# Patient Record
Sex: Female | Born: 1965 | Race: White | Hispanic: No | Marital: Single | Smoking: Former smoker
Health system: Southern US, Community
[De-identification: ages and names within clinical notes are randomized; demographics above are authoritative.]

## PROBLEM LIST (undated history)

## (undated) DIAGNOSIS — R232 Flushing: Secondary | ICD-10-CM

## (undated) DIAGNOSIS — K219 Gastro-esophageal reflux disease without esophagitis: Secondary | ICD-10-CM

## (undated) DIAGNOSIS — F32A Depression, unspecified: Secondary | ICD-10-CM

## (undated) DIAGNOSIS — F329 Major depressive disorder, single episode, unspecified: Secondary | ICD-10-CM

## (undated) DIAGNOSIS — R12 Heartburn: Secondary | ICD-10-CM

## (undated) DIAGNOSIS — R0602 Shortness of breath: Secondary | ICD-10-CM

## (undated) DIAGNOSIS — I493 Ventricular premature depolarization: Secondary | ICD-10-CM

## (undated) DIAGNOSIS — J45909 Unspecified asthma, uncomplicated: Secondary | ICD-10-CM

## (undated) DIAGNOSIS — C50919 Malignant neoplasm of unspecified site of unspecified female breast: Secondary | ICD-10-CM

## (undated) DIAGNOSIS — C801 Malignant (primary) neoplasm, unspecified: Secondary | ICD-10-CM

## (undated) DIAGNOSIS — Z923 Personal history of irradiation: Secondary | ICD-10-CM

## (undated) DIAGNOSIS — F419 Anxiety disorder, unspecified: Secondary | ICD-10-CM

## (undated) DIAGNOSIS — R011 Cardiac murmur, unspecified: Secondary | ICD-10-CM

## (undated) DIAGNOSIS — J449 Chronic obstructive pulmonary disease, unspecified: Secondary | ICD-10-CM

## (undated) DIAGNOSIS — Z8489 Family history of other specified conditions: Secondary | ICD-10-CM

## (undated) HISTORY — DX: Major depressive disorder, single episode, unspecified: F32.9

## (undated) HISTORY — DX: Chronic obstructive pulmonary disease, unspecified: J44.9

## (undated) HISTORY — DX: Flushing: R23.2

## (undated) HISTORY — PX: WISDOM TOOTH EXTRACTION: SHX21

## (undated) HISTORY — PX: BREAST LUMPECTOMY: SHX2

## (undated) HISTORY — PX: ABDOMINAL HYSTERECTOMY: SHX81

## (undated) HISTORY — PX: OTHER SURGICAL HISTORY: SHX169

## (undated) HISTORY — PX: TONSILLECTOMY: SUR1361

## (undated) HISTORY — PX: BREAST SURGERY: SHX581

## (undated) HISTORY — PX: BREAST BIOPSY: SHX20

## (undated) HISTORY — DX: Depression, unspecified: F32.A

---

## 2009-04-18 DIAGNOSIS — J449 Chronic obstructive pulmonary disease, unspecified: Secondary | ICD-10-CM

## 2009-04-18 HISTORY — DX: Chronic obstructive pulmonary disease, unspecified: J44.9

## 2009-06-11 ENCOUNTER — Observation Stay (HOSPITAL_COMMUNITY): Admission: EM | Admit: 2009-06-11 | Discharge: 2009-06-12 | Payer: Self-pay | Admitting: Emergency Medicine

## 2009-06-11 DIAGNOSIS — J449 Chronic obstructive pulmonary disease, unspecified: Secondary | ICD-10-CM

## 2009-06-11 DIAGNOSIS — J4489 Other specified chronic obstructive pulmonary disease: Secondary | ICD-10-CM | POA: Insufficient documentation

## 2009-06-12 LAB — CONVERTED CEMR LAB
ALT: 22 units/L
AST: 39 units/L
Alkaline Phosphatase: 44 units/L
BUN: 6 mg/dL
Creatinine, Ser: 0.95 mg/dL
Glucose, Bld: 125 mg/dL
Total Bilirubin: 0.2 mg/dL

## 2009-06-22 ENCOUNTER — Ambulatory Visit: Payer: Self-pay | Admitting: Nurse Practitioner

## 2009-06-22 DIAGNOSIS — F17200 Nicotine dependence, unspecified, uncomplicated: Secondary | ICD-10-CM

## 2009-06-22 DIAGNOSIS — N83209 Unspecified ovarian cyst, unspecified side: Secondary | ICD-10-CM

## 2009-06-22 DIAGNOSIS — R030 Elevated blood-pressure reading, without diagnosis of hypertension: Secondary | ICD-10-CM

## 2009-10-07 ENCOUNTER — Ambulatory Visit: Payer: Self-pay | Admitting: Nurse Practitioner

## 2009-10-07 ENCOUNTER — Telehealth (INDEPENDENT_AMBULATORY_CARE_PROVIDER_SITE_OTHER): Payer: Self-pay | Admitting: Nurse Practitioner

## 2009-10-07 DIAGNOSIS — N920 Excessive and frequent menstruation with regular cycle: Secondary | ICD-10-CM

## 2009-10-07 DIAGNOSIS — E781 Pure hyperglyceridemia: Secondary | ICD-10-CM

## 2009-10-07 LAB — CONVERTED CEMR LAB
ALT: 16 units/L (ref 0–35)
AST: 21 units/L (ref 0–37)
Albumin: 4.7 g/dL (ref 3.5–5.2)
Basophils Relative: 0 % (ref 0–1)
Blood in Urine, dipstick: NEGATIVE
CA 125: 17.2 units/mL (ref 0.0–30.2)
CO2: 21 meq/L (ref 19–32)
Chlamydia, DNA Probe: NEGATIVE
Cholesterol: 218 mg/dL — ABNORMAL HIGH (ref 0–200)
Creatinine, Ser: 0.91 mg/dL (ref 0.40–1.20)
Eosinophils Absolute: 0.2 10*3/uL (ref 0.0–0.7)
Eosinophils Relative: 3 % (ref 0–5)
Glucose, Bld: 81 mg/dL (ref 70–99)
Hemoglobin: 11.8 g/dL — ABNORMAL LOW (ref 12.0–15.0)
KOH Prep: NEGATIVE
LDL Cholesterol: 93 mg/dL (ref 0–99)
Lymphocytes Relative: 9 % — ABNORMAL LOW (ref 12–46)
MCHC: 31.1 g/dL (ref 30.0–36.0)
MCV: 85 fL (ref 78.0–100.0)
Monocytes Relative: 7 % (ref 3–12)
Nitrite: NEGATIVE
OCCULT 1: NEGATIVE
Potassium: 4.2 meq/L (ref 3.5–5.3)
Protein, U semiquant: NEGATIVE
Specific Gravity, Urine: 1.025
TSH: 0.786 microintl units/mL (ref 0.350–4.500)
Total CHOL/HDL Ratio: 4.3

## 2009-10-08 ENCOUNTER — Telehealth (INDEPENDENT_AMBULATORY_CARE_PROVIDER_SITE_OTHER): Payer: Self-pay | Admitting: Nurse Practitioner

## 2009-10-08 ENCOUNTER — Encounter (INDEPENDENT_AMBULATORY_CARE_PROVIDER_SITE_OTHER): Payer: Self-pay | Admitting: Nurse Practitioner

## 2009-10-08 LAB — CONVERTED CEMR LAB: Pap Smear: NEGATIVE

## 2009-10-09 ENCOUNTER — Telehealth (INDEPENDENT_AMBULATORY_CARE_PROVIDER_SITE_OTHER): Payer: Self-pay | Admitting: Nurse Practitioner

## 2009-10-21 ENCOUNTER — Ambulatory Visit (HOSPITAL_COMMUNITY): Admission: RE | Admit: 2009-10-21 | Discharge: 2009-10-21 | Payer: Self-pay | Admitting: Internal Medicine

## 2010-05-18 NOTE — Letter (Signed)
Summary: Handout Printed  Printed Handout:  - Chronic Obstructive Pulmonary Disease (COPD)

## 2010-05-18 NOTE — Progress Notes (Signed)
Summary: Needs cough and headache medicine  Phone Note Call from Patient   Summary of Call: The pt states that she needs the provider cought medication and headache.  [facility] [place]) Daphine Deutscher FNP Initial call taken by: Manon Hilding,  October 08, 2009 3:13 PM  Follow-up for Phone Call        Left message on answering machine to return call. Dutch Quint RN  October 08, 2009 3:46 PM   Additional Follow-up for Phone Call Additional follow up Details #1::        cough meds are all OTC she can take robitussin DM every 4- 6 hours for cough.  May also take tylenol cold and sinus if she wants something for both cough and headache she was already started on antbiotic during last visit she will need to take this for at least 3 days to get any relief from the sinus pressure which may be causing the headache Additional Follow-up by: Lehman Prom FNP,  October 08, 2009 5:43 PM    Additional Follow-up for Phone Call Additional follow up Details #2::    Pt. notified. Advised to call if gets worse or no improvement by next week.  Follow-up by: Gaylyn Cheers RN,  October 09, 2009 9:48 AM

## 2010-05-18 NOTE — Progress Notes (Signed)
Summary: Plantar Fascitis  Phone Note Outgoing Call   Summary of Call: received note regarding plantar fascitis no mention of this during visit but this office does NOT provide injections would suggest she purchase some shoe inserts with heel support to put in her shoes especially when she is working she could try anti-inflammatory (naprosyn 500mg  by mouth two times a day as needed for pain) Rx in basket should pt want to try Initial call taken by: Lehman Prom FNP,  October 07, 2009 1:28 PM  Follow-up for Phone Call        Left message on answering machine to return call. Dutch Quint RN  October 08, 2009 3:46 PM  Levon Hedger  October 09, 2009 12:21 PM Left message on machine for pt to return call to the office.  pt informed of above information.  Rx faxed to [facility]  New Problems: HYPERTRIGLYCERIDEMIA (ICD-272.1)   New Problems: HYPERTRIGLYCERIDEMIA (ICD-272.1) New/Updated Medications: NAPROXEN 500 MG TABS (NAPROXEN) One tablet by mouth two times a day as needed for pain Prescriptions: NAPROXEN 500 MG TABS (NAPROXEN) One tablet by mouth two times a day as needed for pain  #50 x 0   Entered and Authorized by:   Lehman Prom FNP   Signed by:   Lehman Prom FNP on 10/07/2009   Method used:   Printed then faxed to ...         RxID:   [id number]

## 2010-05-18 NOTE — Letter (Signed)
Summary: PT INFORMATION SHEET  PT INFORMATION SHEET   Imported By: Arta Bruce 08/19/2009 11:57:14  _____________________________________________________________________  External Attachment:    Type:   Image     Comment:   External Document

## 2010-05-18 NOTE — Letter (Signed)
Summary: Handout Printed  Printed Handout:  - Sinusitis 

## 2010-05-18 NOTE — Progress Notes (Signed)
Summary: Office Visit//DEPRESSION SCREENING  Office Visit//DEPRESSION SCREENING   Imported By: Arta Bruce 10/07/2009 12:02:44  _____________________________________________________________________  External Attachment:    Type:   Image     Comment:   External Document

## 2010-05-18 NOTE — Progress Notes (Signed)
Summary: indigestion medication  Phone Note Call from Patient   Summary of Call: pt is asking if she can get an Rx for real bad indigestion.  she says that the prilosec is very expensive and it does not help much. Initial call taken by: Levon Hedger,  October 09, 2009 12:30 PM  Follow-up for Phone Call        She must have gotten Prilosec over the counter as I do not see in her Med list. She can get generic Pepcid or generic Zantac. She can take 20-40 mg Pecid two times a day or 150 mg of Zantac two times a day. The generics should be pretty cheap. If she is having that significant heartburn, she should make an appt for evaluation.  Follow-up by: Tereso Newcomer PA-C,  October 09, 2009 2:49 PM  Additional Follow-up for Phone Call Additional follow up Details #1::        Also advise pt of foods to avoid - tomato sauces in pizza, lasagna, spagetti. onions, garlic Avoid caffiene, nicotine, acidic foods such as oranges eat small frequent meals per day do not lay down within 1 hour of eating dinner Will give Rx for zantac - it is $[facility] on [facility] plan (rx in basket) Additional Follow-up by: Lehman Prom FNP,  October 12, 2009 8:16 AM    Additional Follow-up for Phone Call Additional follow up Details #2::    Levon Hedger  October 13, 2009 4:56 PM Left message on machine for pt to return call to the office.  Left message on answering machine to return call.  Dutch Quint RN  October 14, 2009 5:47 PM  Levon Hedger  October 15, 2009 12:30 PM Left message on machine for pt to return call to the office.  Levon Hedger  October 15, 2009 4:23 PM pt informed of above.Marland KitchenMarland KitchenRx faxed to [facility] Follow-up by: Levon Hedger,  October 15, 2009 4:24 PM  New/Updated Medications: ZANTAC 150 MG TABS (RANITIDINE HCL) One tablet by mouth before breakfast and before bedtime for stomach Prescriptions: ZANTAC 150 MG TABS (RANITIDINE HCL) One tablet by mouth before breakfast and before bedtime for stomach  #60  x 3   Entered and Authorized by:   Lehman Prom FNP   Signed by:   Lehman Prom FNP on 10/12/2009   Method used:   Print then Give to Patient   RxID:   [id number]

## 2010-05-18 NOTE — Assessment & Plan Note (Signed)
Summary: Complete Physical Exam   Vital Signs:  Patient profile:   45 year old female Menstrual status:  regular LMP:     09/21/2009 Weight:      189.9 pounds BMI:     32.46 Temp:     98.9 degrees F oral Pulse rate:   91 / minute Pulse rhythm:   regular Resp:     20 per minute BP sitting:   127 / 77  (left arm) Cuff size:   regular  Vitals Entered By: Levon Hedger (October 07, 2009 10:56 AM) CC: Cpp...sinus issues that started about 3 days ago Pain Assessment Patient in pain? yes     Location: head  Does patient need assistance? Functional Status Self care Ambulation Normal LMP (date): 09/21/2009 LMP - Character: heavy    Menses interval (days): 28 Menstrual flow (days): 7 Menstrual Status regular Enter LMP: 09/21/2009   CC:  Cpp...sinus issues that started about 3 days ago.  History of Present Illness:  Pt into the office for a complete physical exam  PAP - Previous hx of abnormal PAP Smears that required intervention by GYN - last 8 years ago. Menses - monthly with lots of clots and cramping, Last menses 09/21/2009 and only last for 3 days instead of her usual 7 days No children, no current birth control No family history of cervical or ovarian cancer but pt did have a cyst removed from one ofher ovaries  Mammogram - no history of mammogram no family history of breast cancer  Dental - no recent dental exam -  last 5 years ago  Optho - No current glasses or contacts  Habits & Providers  Alcohol-Tobacco-Diet     Alcohol drinks/day: 0     Tobacco Status: current     Tobacco Counseling: to remain off tobacco products     Cigarette Packs/Day: 1 ppd     Year Started: age 6     Year Quit: 05/2009 then restarted  Exercise-Depression-Behavior     Does Patient Exercise: no     Have you felt down or hopeless? no     Have you felt little pleasure in things? no     Depression Counseling: not indicated; screening negative for depression     Drug Use:  never  Comments: Smoking - started back since last vist in this office PHQ-9 score = 18  Allergies (verified): No Known Drug Allergies  Social History: Smoking Status:  current Does Patient Exercise:  no  Review of Systems General:  Denies fever. Eyes:  pressure behind eyes. ENT:  Complains of nasal congestion and sore throat; denies earache. CV:  Denies chest pain or discomfort and fatigue. Resp:  Denies cough. GI:  Complains of diarrhea; denies nausea and vomiting. GU:  Denies dysuria; +vaginal itching. MS:  Complains of joint pain; breast tenderness with menses. Derm:  Denies rash. Neuro:  Complains of headaches. Psych:  Denies anxiety and depression.  Physical Exam  General:  alert.   Head:  long hair hair evenly distributed over scalp Eyes:  pupils equal, pupils round, and pupils reactive to light.   Ears:  bil TM with minimal cerumen  Nose:  inflammed turbinates Mouth:  pharynx pink and moist.   fair dentation Neck:  supple.   Chest Wall:  no mass.   Breasts:  skin/areolae normal, no masses, and no abnormal thickening.   Lungs:  normal breath sounds.   Heart:  normal rate and regular rhythm.   Abdomen:  soft, non-tender, and  normal bowel sounds.   Rectal:  no external abnormalities.   Msk:  up to the exam table Pulses:  R radial normal and L radial normal.   Extremities:  no edema Neurologic:  alert & oriented X3, cranial nerves II-XII intact, and gait normal.   Skin:  color normal.   Psych:  Oriented X3.    Pelvic Exam  Vulva:      normal appearance.   Urethra and Bladder:      Urethra--normal.   Vagina:      physiologic discharge.   Cervix:      midposition.   Uterus:      tender.   Adnexa:      nontender bilaterally.   Rectum:      normal, heme negative stool.      Impression & Recommendations:  Problem # 1:  ROUTINE GYNECOLOGICAL EXAMINATION (ICD-V72.31) PHQ-9 score = 18 labs done  PAP done rec optho and dental  exam Orders: Hemoccult Guaiac-1 spec.(in office) (82270) UA Dipstick w/o Micro (manual) (40102) KOH/ WET Mount [phone]) Pap Smear, Thin Prep ( Collection of) [phone]) T- GC Chlamydia (47425) T-Lipid Profile [id number]) T-Comprehensive Metabolic Panel [phone]) T-CBC w/Diff (60630-16010) Rapid HIV  (92370) T-Syphilis Test (RPR) (93235-57322) T-TSH (02542-70623)  Problem # 2:  UNSPECIFIED BREAST SCREENING (ICD-V76.10) self breast exam placcard given mammogram ordered Orders: Mammogram (Screening) (Mammo)  Problem # 3:  TOBACCO ABUSE (ICD-305.1) pt has restarted smoking - advised pt to stop smoking  Problem # 4:  ELEVATED BLOOD PRESSURE WITHOUT DIAGNOSIS OF HYPERTENSION (ICD-796.2) Bp is improved today  Problem # 5:  SINUSITIS (ICD-473.9) handout given Her updated medication list for this problem includes:    Amoxicillin 500 Mg Tabs (Amoxicillin) ..... One tablet by mouth three times a day for infection  Problem # 6:  MENORRHAGIA (ICD-626.2)  Orders: T-CA 125 (76283-15176)  Complete Medication List: 1)  Combivent 18-103 Mcg/act Aero (Ipratropium-albuterol) .... Two puffs twice per day for breathing 2)  Advair Diskus 250-50 Mcg/dose Aepb (Fluticasone-salmeterol) .... One inhalation twice daily for breathing 3)  Wellbutrin Sr 150 Mg Xr12h-tab (Bupropion hcl) .... One tablet by mouth nightly x 3 days then increase to two times a day 4)  Amoxicillin 500 Mg Tabs (Amoxicillin) .... One tablet by mouth three times a day for infection 5)  Loratadine 10 Mg Tabs (Loratadine) .... One tablet by mouth daily for allergies 6)  Fluconazole 150 Mg Tabs (Fluconazole) .... One tablet by mouth x 2 doses  Patient Instructions: 1)  Sinusitis - will treat with amoxil 500mg  by mouth three times a day (take with food) 2)  also start loratadine 10mg  by mouth daily  3)  ([facility]) 4)  PHQ-9 depression screen score = 18 5)  This is a high score.  We have a mental health  counselor on site if you want to schedule an appointment with her to take about your mood and feelings 6)  Blood pressure - better today 7)  PAP smear - will give you the resuts when available 8)  Yeast seen under the microscope - will treat with fluconazole 9)  Take 150mg  x 1 dose before antibiotics 10)  Take 150mg  x 1 dose after the antibiotics (7 days) 11)  Follow up as needed Prescriptions: FLUCONAZOLE 150 MG TABS (FLUCONAZOLE) One tablet by mouth x 2 doses  #2 x 0   Entered and Authorized by:   Lehman Prom FNP   Signed by:   Lehman Prom FNP on 10/07/2009  Method used:   Print then Give to Patient   RxID:   [id number] LORATADINE 10 MG TABS (LORATADINE) One tablet by mouth daily for allergies  #30 x 3   Entered and Authorized by:   Lehman Prom FNP   Signed by:   Lehman Prom FNP on 10/07/2009   Method used:   Print then Give to Patient   RxID:   [id number] AMOXICILLIN 500 MG TABS (AMOXICILLIN) One tablet by mouth three times a day for infection  #21 x 0   Entered and Authorized by:   Lehman Prom FNP   Signed by:   Lehman Prom FNP on 10/07/2009   Method used:   Print then Give to Patient   RxID:   [id number]   Laboratory Results   Urine Tests  Date/Time Received: October 07, 2009 11:38 AM   Routine Urinalysis   Color: lt. yellow Glucose: negative   (Normal Range: Negative) Bilirubin: negative   (Normal Range: Negative) Ketone: negative   (Normal Range: Negative) Spec. Gravity: 1.025   (Normal Range: 1.003-1.035) Blood: negative   (Normal Range: Negative) pH: 5.0   (Normal Range: 5.0-8.0) Protein: negative   (Normal Range: Negative) Urobilinogen: 0.2   (Normal Range: 0-1) Nitrite: negative   (Normal Range: Negative) Leukocyte Esterace: negative   (Normal Range: Negative)    Date/Time Received: October 07, 2009 11:38 AM   Wet Mount Source: vaginal WBC/hpf: 1-5 Bacteria/hpf: 1+ Clue cells/hpf: few Yeast/hpf: few Wet Mount  KOH: Negative Trichomonas/hpf: none  Stool - Occult Blood Hemmoccult #1: negative Date: 10/07/2009

## 2010-05-18 NOTE — Assessment & Plan Note (Signed)
Summary: NEW - Establish Care   Vital Signs:  Patient profile:   45 year old female LMP:     06/2009 Height:      64.25 inches Weight:      190.4 pounds BMI:     32.55 O2 Sat:      96 % on Room air Temp:     97.7 degrees F oral Pulse rate:   62 / minute Pulse rhythm:   regular Resp:     16 per minute BP sitting:   95 / 66  (left arm) Cuff size:   regular  Vitals Entered By: Levon Hedger (June 22, 2009 11:12 AM)  Nutrition Counseling: Patient's BMI is greater than 25 and therefore counseled on weight management options.  O2 Flow:  Room air  Serial Vital Signs/Assessments:  Comments: 12:27 PM 300, 250, 250 - Peak Flow done by Levon Hedger By: Lehman Prom FNP   CC: follow-up visit [facility]  Is Patient Diabetic? No Pain Assessment Patient in pain? no       Does patient need assistance? Functional Status Self care Ambulation Normal Comments manual BP was 100/68 was an irregular beat. LMP (date): 06/2009     Enter LMP: 06/2009   CC:  follow-up visit [facility] .  History of Present Illness:  Jessica [name] into the office to establish care. Previous PCP was in [place], [place] but then Jessica [name] moved to [place] and has only been to the ER for primary care.  PMH - none PSH - ovarian cysts removed in 2000  Jessica [name] presented to the ER on 06/11/2009 with SOB. Quit smoking 2 weeks prior to ER visit. Dx = Acute COPD Exacerbation - admitted with O2, started on steroids, albuterol, and antibiotics.  Improved the next day.  She was then started on a steroid taper and d/c home. She has finished the steroids and antibiotics as ordered. She was given combivent inhaler and she has been using prn  Tachycardia - likely due to multiple respiratory treatments. Resolved prior to d/c  Hyperkalemia - resolved.  social - just started working at [employer] and she started 2 days after the hospitalization.  She really has not had to rest and recooperate.  she has been out of work for over 6 months so really  needs to keep working  Habits & Providers  Alcohol-Tobacco-Diet     Alcohol drinks/day: 0     Tobacco Status: quit     Tobacco Counseling: to remain off tobacco products     Cigarette Packs/Day: 1 ppd     Year Started: age 79     Year Quit: 05/2009  Exercise-Depression-Behavior     Have you felt down or hopeless? no     Have you felt little pleasure in things? no     Depression Counseling: not indicated; screening negative for depression     Drug Use: never  Comments: quit smoking 3 weeks ago but she is having a hard time.  She has tried nicotine gum that did not help.  Jessica [name] would like a patch as she has taken in the hospital  Allergies (verified): No Known Drug Allergies  Family History: maternal grandfather- diabetes mother- htn father - COPD/Emphysema, htn  Social History: Smoking Status:  quit Drug Use:  never Packs/Day:  1 ppd  Review of Systems General:  Complains of chills; denies fever and loss of appetite. Eyes:  Denies blurring. CV:  Complains of shortness of breath with exertion; denies chest pain or discomfort. Resp:  Complains of shortness of breath; denies wheezing; improved following the hospitalization. GI:  Complains of indigestion; denies abdominal pain, nausea, and vomiting. MS:  aches all over body.  Physical Exam  General:  alert.   Head:  normocephalic.   Lungs:  decreased bases - no rhonchi or wheezes Heart:  normal rate and regular rhythm.   Msk:  up to the exam table Neurologic:  alert & oriented X3.     Impression & Recommendations:  Problem # 1:  CHRONIC OBSTRUCTIVE PULMONARY DISEASE (ICD-496) Advised Jessica [name] about the dx-handout given Jessica [name] needs time for her immune system to heal Jessica [name] has quit smoking for the past 3 weeks Her updated medication list for this problem includes:    Combivent 18-103 Mcg/act Aero (Ipratropium-albuterol) .Marland Kitchen..Marland Kitchen Two puffs twice per day for breathing    Advair Diskus 250-50 Mcg/dose Aepb (Fluticasone-salmeterol)  ..... One inhalation twice daily for breathing  Orders: Peak Flow Rate (94150) Pulse Oximetry (single measurment) (94760)  Problem # 2:  ELEVATED BLOOD PRESSURE WITHOUT DIAGNOSIS OF HYPERTENSION (ICD-796.2) actually hypotensive today will monitor  Problem # 3:  TOBACCO ABUSE (ICD-305.1) quit x 3 weeks ago will give wellbutrin   Complete Medication List: 1)  Combivent 18-103 Mcg/act Aero (Ipratropium-albuterol) .... Two puffs twice per day for breathing 2)  Advair Diskus 250-50 Mcg/dose Aepb (Fluticasone-salmeterol) .... One inhalation twice daily for breathing 3)  Wellbutrin Sr 150 Mg Xr12h-tab (Bupropion hcl) .... One tablet by mouth nightly x 3 days then increase to two times a day  Patient Instructions: 1)  COPD -use your advair twice daily. 2)  Use the combivent twice daily 3)  Smoking Cessation - start wellbutrin 150mg  by mouth nightly x 3 nights then increase to twice daily.  This will help decrease your want for tobacco 4)  Follow up for a complete physical exam after you get your orange card. Do not eat after midnight before this appointment. You can drink water. Prescriptions: WELLBUTRIN SR 150 MG XR12H-TAB (BUPROPION HCL) One tablet by mouth nightly x 3 days then increase to two times a day  #60 x 1   Entered and Authorized by:   Lehman Prom FNP   Signed by:   Lehman Prom FNP on 06/22/2009   Method used:   Print then Give to Patient   RxID:   [id number] COMBIVENT 18-103 MCG/ACT AERO (IPRATROPIUM-ALBUTEROL) Two puffs twice per day for breathing  #1 x 3   Entered and Authorized by:   Lehman Prom FNP   Signed by:   Lehman Prom FNP on 06/22/2009   Method used:   Print then Give to Patient   RxID:   [id number] ADVAIR DISKUS 250-50 MCG/DOSE AEPB (FLUTICASONE-SALMETEROL) One inhalation twice daily for breathing  #1 x 3   Entered and Authorized by:   Lehman Prom FNP   Signed by:   Lehman Prom FNP on 06/22/2009   Method used:   Print then  Give to Patient   RxID:   [id number]    CXR  Procedure date:  06/11/2009  Findings:      mild increase in interstitial  opacity, viral or atypical respiratory infection   MISC. Report  Procedure date:  06/11/2009  Findings:      no evidence of PE   CXR  Procedure date:  06/11/2009  Findings:      mild increase in interstitial  opacity, viral or atypical respiratory infection   MISC. Report  Procedure date:  06/11/2009  Findings:      no  evidence of PE

## 2010-05-18 NOTE — Letter (Signed)
Summary: Lipid Letter  [facility]  [address] [place], [place] [address]   Phone: [phone]  Fax: [phone]    10/08/2009  Jessica [name] [address] [place], [place]  [address]  Dear Jessica [name]:  We have carefully reviewed your last lipid profile from 10/07/2009 and the results are noted below with a summary of recommendations for lipid management.    Cholesterol:       218     Goal: less than 200   HDL "good" Cholesterol:   51     Goal: greater than 40   LDL "bad" Cholesterol:   93     Goal: less than 130   Triglycerides:       370     Goal: less than 150    Labs done during recent office visit ok except cholesterol is high and most importantly your triglycerides are high.  You will need to modify your diet to avoid fried, fatty foods.  Make low fat food choices such as low fat milk, sour cream, mayo, etc. No need for medications at this time, will allow you to change your lifestyle and see if level will drop.  Will need to recheck cholesterol in 3-4 months.  Pap Smear results __________________________.    Current Medications: 1)    Combivent 18-103 Mcg/act Aero (Ipratropium-albuterol) .... Two puffs twice per day for breathing 2)    Advair Diskus 250-50 Mcg/dose Aepb (Fluticasone-salmeterol) .... One inhalation twice daily for breathing 3)    Wellbutrin Sr 150 Mg Xr12h-tab (Bupropion hcl) .... One tablet by mouth nightly x 3 days then increase to two times a day 4)    Amoxicillin 500 Mg Tabs (Amoxicillin) .... One tablet by mouth three times a day for infection 5)    Loratadine 10 Mg Tabs (Loratadine) .... One tablet by mouth daily for allergies 6)    Fluconazole 150 Mg Tabs (Fluconazole) .... One tablet by mouth x 2 doses 7)    Naproxen 500 Mg Tabs (Naproxen) .... One tablet by mouth two times a day as needed for pain  If you have any questions, please call. We appreciate being able to work with you.   Sincerely,    Mishaal Lansdale  Martin,FNP [facility]

## 2010-07-09 LAB — D-DIMER, QUANTITATIVE: D-Dimer, Quant: 0.39 ug/mL-FEU (ref 0.00–0.48)

## 2010-07-09 LAB — CBC
HCT: 30.4 % — ABNORMAL LOW (ref 36.0–46.0)
Hemoglobin: 10.4 g/dL — ABNORMAL LOW (ref 12.0–15.0)
MCV: 86.8 fL (ref 78.0–100.0)
MCV: 87.1 fL (ref 78.0–100.0)
RBC: 3.49 MIL/uL — ABNORMAL LOW (ref 3.87–5.11)
WBC: 10.2 10*3/uL (ref 4.0–10.5)
WBC: 8.5 10*3/uL (ref 4.0–10.5)

## 2010-07-09 LAB — HEMOGLOBIN A1C
Hgb A1c MFr Bld: 5.4 % (ref 4.6–6.1)
Mean Plasma Glucose: 108 mg/dL

## 2010-07-09 LAB — CK TOTAL AND CKMB (NOT AT ARMC)
CK, MB: 4.8 ng/mL — ABNORMAL HIGH (ref 0.3–4.0)
Relative Index: 0.3 (ref 0.0–2.5)
Total CK: 1007 U/L — ABNORMAL HIGH (ref 7–177)
Total CK: 1934 U/L — ABNORMAL HIGH (ref 7–177)

## 2010-07-09 LAB — TSH: TSH: 0.549 u[IU]/mL (ref 0.350–4.500)

## 2010-07-09 LAB — DIFFERENTIAL
Basophils Absolute: 0 10*3/uL (ref 0.0–0.1)
Basophils Relative: 0 % (ref 0–1)
Eosinophils Absolute: 0 10*3/uL (ref 0.0–0.7)
Eosinophils Absolute: 0 10*3/uL (ref 0.0–0.7)
Lymphocytes Relative: 2 % — ABNORMAL LOW (ref 12–46)
Lymphocytes Relative: 4 % — ABNORMAL LOW (ref 12–46)
Lymphs Abs: 0.2 10*3/uL — ABNORMAL LOW (ref 0.7–4.0)
Lymphs Abs: 0.4 10*3/uL — ABNORMAL LOW (ref 0.7–4.0)
Monocytes Absolute: 0.3 10*3/uL (ref 0.1–1.0)
Monocytes Relative: 3 % (ref 3–12)
Monocytes Relative: 4 % (ref 3–12)
Neutro Abs: 9.8 10*3/uL — ABNORMAL HIGH (ref 1.7–7.7)
Neutrophils Relative %: 96 % — ABNORMAL HIGH (ref 43–77)

## 2010-07-09 LAB — COMPREHENSIVE METABOLIC PANEL
Alkaline Phosphatase: 44 U/L (ref 39–117)
BUN: 6 mg/dL (ref 6–23)
Chloride: 108 mEq/L (ref 96–112)
GFR calc non Af Amer: >60 mL/min (ref >60)
Glucose, Bld: 125 mg/dL — ABNORMAL HIGH (ref 70–99)
Potassium: 3.3 mEq/L — ABNORMAL LOW (ref 3.5–5.1)
Sodium: 139 mEq/L (ref 135–145)

## 2010-07-09 LAB — TROPONIN I
Troponin I: 0.01 ng/mL (ref 0.00–0.06)
Troponin I: 0.03 ng/mL (ref 0.00–0.06)
Troponin I: 0.03 ng/mL (ref 0.00–0.06)

## 2010-07-09 LAB — POCT I-STAT, CHEM 8
BUN: 7 mg/dL (ref 6–23)
Chloride: 105 mEq/L (ref 96–112)
HCT: 32 % — ABNORMAL LOW (ref 36.0–46.0)
TCO2: 17 mmol/L (ref 0–100)

## 2013-01-22 ENCOUNTER — Other Ambulatory Visit (HOSPITAL_COMMUNITY): Payer: Self-pay | Admitting: Nurse Practitioner

## 2013-01-22 DIAGNOSIS — N920 Excessive and frequent menstruation with regular cycle: Secondary | ICD-10-CM

## 2013-01-22 DIAGNOSIS — N926 Irregular menstruation, unspecified: Secondary | ICD-10-CM

## 2013-01-24 ENCOUNTER — Ambulatory Visit (HOSPITAL_COMMUNITY)
Admission: RE | Admit: 2013-01-24 | Discharge: 2013-01-24 | Disposition: A | Discharge status: Home / Self Care | Payer: No Typology Code available for payment source | Source: Ambulatory Visit | Attending: Nurse Practitioner | Admitting: Nurse Practitioner

## 2013-01-24 DIAGNOSIS — D251 Intramural leiomyoma of uterus: Secondary | ICD-10-CM | POA: Insufficient documentation

## 2013-01-24 DIAGNOSIS — N920 Excessive and frequent menstruation with regular cycle: Secondary | ICD-10-CM

## 2013-01-24 DIAGNOSIS — N72 Inflammatory disease of cervix uteri: Secondary | ICD-10-CM | POA: Insufficient documentation

## 2013-01-24 DIAGNOSIS — N926 Irregular menstruation, unspecified: Secondary | ICD-10-CM | POA: Insufficient documentation

## 2013-03-11 ENCOUNTER — Encounter: Payer: Self-pay | Admitting: *Deleted

## 2013-03-29 ENCOUNTER — Encounter: Payer: Self-pay | Admitting: *Deleted

## 2013-03-29 ENCOUNTER — Ambulatory Visit (INDEPENDENT_AMBULATORY_CARE_PROVIDER_SITE_OTHER): Payer: No Typology Code available for payment source | Admitting: Obstetrics & Gynecology

## 2013-03-29 ENCOUNTER — Other Ambulatory Visit (HOSPITAL_COMMUNITY)
Admission: RE | Admit: 2013-03-29 | Discharge: 2013-03-29 | Disposition: A | Discharge status: Home / Self Care | Payer: No Typology Code available for payment source | Source: Ambulatory Visit | Attending: Obstetrics & Gynecology | Admitting: Obstetrics & Gynecology

## 2013-03-29 ENCOUNTER — Encounter: Payer: Self-pay | Admitting: Obstetrics & Gynecology

## 2013-03-29 VITALS — BP 130/88 | HR 76 | Temp 97.3°F | Ht 65.0 in | Wt 190.1 lb

## 2013-03-29 DIAGNOSIS — N920 Excessive and frequent menstruation with regular cycle: Secondary | ICD-10-CM

## 2013-03-29 LAB — FOLLICLE STIMULATING HORMONE: FSH: 11.1 m[IU]/mL

## 2013-03-29 LAB — CBC
HCT: 41.2 % (ref 36.0–46.0)
MCH: 32 pg (ref 26.0–34.0)
MCHC: 34 g/dL (ref 30.0–36.0)
MCV: 94.3 fL (ref 78.0–100.0)
RBC: 4.37 MIL/uL (ref 3.87–5.11)
RDW: 16.8 % — ABNORMAL HIGH (ref 11.5–15.5)

## 2013-03-29 MED ORDER — MEGESTROL ACETATE 40 MG PO TABS: 40.0000 mg | ORAL_TABLET | Freq: Two times a day (BID) | ORAL | Status: DC

## 2013-03-29 NOTE — Patient Instructions (Signed)
ENDOMETRIAL BIOPSY POST-PROCEDURE INSTRUCTIONS  1. You may take Ibuprofen, Aleve or Tylenol for pain if needed.  Cramping should resolve within in 24 hours.  2. You may have a small amount of spotting.  You should wear a mini pad for the next few days.  3. You may have intercourse after 24 hours.  4. You need to call if you have any pelvic pain, fever, heavy bleeding or foul smelling vaginal discharge.  5. Shower or bathe as normal  6. We will call you within one week with results or we will discuss   the results at your follow-up appointment if needed.  Menorrhagia Dysfunctional uterine bleeding is different from a normal menstrual period. When periods are heavy or there is more bleeding than is usual for you, it is called menorrhagia. It may be caused by hormonal imbalance, or physical, metabolic, or other problems. Examination is necessary in order that your caregiver may treat treatable causes. If this is a continuing problem, a D&C may be needed. That means that the cervix (the opening of the uterus or womb) is dilated (stretched larger) and the lining of the uterus is scraped out. The tissue scraped out is then examined under a microscope by a specialist (pathologist) to make sure there is nothing of concern that needs further or more extensive treatment. HOME CARE INSTRUCTIONS   If medications were prescribed, take exactly as directed. Do not change or switch medications without consulting your caregiver.  Long term heavy bleeding may result in iron deficiency. Your caregiver may have prescribed iron pills. They help replace the iron your body lost from heavy bleeding. Take exactly as directed. Iron may cause constipation. If this becomes a problem, increase the bran, fruits, and roughage in your diet.  Do not take aspirin or medicines that contain aspirin one week before or during your menstrual period. Aspirin may make the bleeding worse.  If you need to change your sanitary pad or  tampon more than once every 2 hours, stay in bed and rest as much as possible until the bleeding stops.  Eat well-balanced meals. Eat foods high in iron. Examples are leafy green vegetables, meat, liver, eggs, and whole grain breads and cereals. Do not try to lose weight until the abnormal bleeding has stopped and your blood iron level is back to normal. SEEK MEDICAL CARE IF:   You need to change your sanitary pad or tampon more than once an hour.  You develop nausea (feeling sick to your stomach) and vomiting, dizziness, or diarrhea while you are taking your medicine.  You have any problems that may be related to the medicine you are taking. SEEK IMMEDIATE MEDICAL CARE IF:   You have a fever.  You develop chills.  You develop severe bleeding or start to pass blood clots.  You feel dizzy or faint. MAKE SURE YOU:   Understand these instructions.  Will watch your condition.  Will get help right away if you are not doing well or get worse. Document Released: 04/04/2005 Document Revised: 06/27/2011 Document Reviewed: 09/23/2012 [facility] Patient Information 2014 [place], [place].

## 2013-03-29 NOTE — Progress Notes (Signed)
Pt. States every 9 days she starts bleeding, lasts for 6 days and then starts up 9 days later. States the first 3 days she goes through a pad an hour and the last 4 days goes through 3 pads per day. States "really bad clots."

## 2013-03-29 NOTE — Progress Notes (Signed)
Subjective:     Patient ID: Jessica [name], female   DOB: [birth year], 47 y.o.   MRN: [id number]  HPI Pt presents with c/o heavy bleeding for several years which over the past 6 months has  Become irregular.  She has no assoc sx.  She reports that she was started on Provera 5mg  for 10 days which failed to change her sx.  She reports not being able to have relations with her partner due to the amount of bleeding.  Past Medical History  Diagnosis Date  . COPD (chronic obstructive pulmonary disease)    Past Surgical History  Procedure Laterality Date  . Polyp removal      No current outpatient prescriptions on file prior to visit.   No current facility-administered medications on file prior to visit.   History   Social History  . Marital Status: Single    Spouse Name: N/A    Number of Children: N/A  . Years of Education: N/A   Occupational History  . Not on file.   Social History Main Topics  . Smoking status: Former Smoker -- 1.00 packs/day  . Smokeless tobacco: Never Used  . Alcohol Use: No  . Drug Use: No  . Sexual Activity: Not Currently   Other Topics Concern  . Not on file   Social History Narrative  . No narrative on file      Review of Systems     Objective:   Physical Exam BP 130/88  Pulse 76  Temp(Src) 97.3 F (36.3 C) (Oral)  Ht 5\' 5"  (1.651 m)  Wt 190 lb 1.6 oz (86.229 kg)  BMI 31.63 kg/m2  LMP 03/23/2013 Lungs: CTA CV: RRR Abs: soft, NT, ND  The indications for endometrial biopsy were reviewed.   Risks of the biopsy including cramping, bleeding, infection, uterine perforation, inadequate specimen and need for additional procedures  were discussed. The patient states she understands and agrees to undergo procedure today. Consent was signed. Time out was performed. Urine HCG was negative. A sterile speculum was placed in the patient's vagina and the cervix was prepped with Betadine. A single-toothed tenaculum was placed on the anterior lip of the  cervix to stabilize it. The 3 mm pipelle was introduced into the endometrial cavity without difficulty to a depth of 10cm, and a moderate amount of tissue was obtained and sent to pathology. The instruments were removed from the patient's vagina. Minimal bleeding from the cervix was noted. The patient tolerated the procedure well.       01/24/2013 EXAM:  TRANSVAGINAL ULTRASOUND OF PELVIS  TECHNIQUE:  Transvaginal ultrasound examination of the pelvis was performed  including evaluation of the uterus, ovaries, adnexal regions, and  pelvic cul-de-sac.  COMPARISON: None.  FINDINGS:  Uterus  Measurements: 10.6 x 6.7 x 3.2 cm. Anteverted. Small nabothian cyst.  There is a 1.8 x 1.0 x 1.5 cm intramural fibroid within the anterior  mid body of the uterus.  Endometrium  Thickness: Measures 6.4 mm. No focal abnormality visualized.  Right ovary  Measurements: Measures 3.6 x 2.3 x 2.2 cm. Normal appearance/no  adnexal mass.  Left ovary  Measurements: Measures 4.2 x 2.6 x 2.2 cm. Within the left ovary is  a 1.6 x 1.6 x 1.5 cm probable corpus luteum versus hemorrhagic  cyst.  Other findings: No free fluid  IMPRESSION:  1. Unremarkable endometrium. If bleeding remains unresponsive to  hormonal or medical therapy, focal lesion work-up with  sonohysterogram should be considered. Endometrial biopsy should also  be considered in pre-menopausal patients at high risk for  endometrial carcinoma. (Ref: Radiological Reasoning: Algorithmic  Workup of Abnormal Vaginal Bleeding with Endovaginal Sonography and  Sonohysterography. AJR 2008; 161:W96-04)  2. Small uterine fibroid.       Assessment:     Menorrhagia now with intermenstrual bleeding- doubt that sx are related to sonographic finding.  Need to ascertain etiology of bleeding       Plan:     Routine post-procedure instructions were given to the patient. The patient will follow up to review the results and for further management in 4-6  weeks. Megace 40mg  bid Labs: TSH, CBC and FSH

## 2013-04-02 ENCOUNTER — Telehealth: Payer: Self-pay

## 2013-04-02 NOTE — Telephone Encounter (Signed)
Called and informed pt. That her labs were normal and that she should follow up in the 6-8 weeks as instructed. Informed her of her appointment 04/29/12 at 145pm. Pt. Verbalized understanding and gratitude and had no other questions or concerns.

## 2013-04-02 NOTE — Telephone Encounter (Signed)
Pt. Called stating we had called her to inform her her results her normal but wanted to know if it was her blood work or biopsy results. Called pt. To inform her her blood work was normal and her biopsies were negative/normal. Pt. Verbalized understanding and gratitude and had no other questions or concerns.

## 2013-04-02 NOTE — Telephone Encounter (Signed)
Message copied by Louanna Raw on Tue Apr 02, 2013  3:43 PM ------      Message from: Willodean Rosenthal      Created: Tue Apr 02, 2013  3:36 PM       Please call pt.  Her labs were normal.  Please have her continue the Megace and f/u in 6-8 weeks as previously instructed.            Thx,      clh-S  ------

## 2013-04-29 ENCOUNTER — Ambulatory Visit (INDEPENDENT_AMBULATORY_CARE_PROVIDER_SITE_OTHER): Payer: No Typology Code available for payment source | Admitting: Obstetrics & Gynecology

## 2013-04-29 ENCOUNTER — Encounter: Payer: Self-pay | Admitting: Obstetrics & Gynecology

## 2013-04-29 VITALS — BP 137/86 | HR 70 | Temp 98.8°F | Ht 65.0 in | Wt 196.8 lb

## 2013-04-29 DIAGNOSIS — N920 Excessive and frequent menstruation with regular cycle: Secondary | ICD-10-CM

## 2013-04-29 MED ORDER — MEGESTROL ACETATE 40 MG PO TABS: 40.0000 mg | ORAL_TABLET | Freq: Every day | ORAL | Status: DC

## 2013-04-29 MED ORDER — MEGESTROL ACETATE 20 MG PO TABS: 20.0000 mg | ORAL_TABLET | Freq: Two times a day (BID) | ORAL | Status: DC

## 2013-04-29 NOTE — Patient Instructions (Signed)

## 2013-04-29 NOTE — Progress Notes (Signed)
Subjective:     Patient ID: Jessica [name], female   DOB: 10/11/1965, 48 y.o.   MRN: [id number]  HPI Pt presents for f/u of menorrhagia.  She is on Megace and reports that her bleeding has resolved.  She continues to c/o pain which improves after she takes the Megace but return just before it's time for her next dose.  She also c/o bloating and increased appetite since taking the Megace.    Review of Systems     Objective:   Physical ExamPt in NAD Exam deferred CBC    Component Value Date/Time   WBC 10.5 03/29/2013 1117   RBC 4.37 03/29/2013 1117   HGB 14.0 03/29/2013 1117   HCT 41.2 03/29/2013 1117   PLT 220 03/29/2013 1117   MCV 94.3 03/29/2013 1117   MCH 32.0 03/29/2013 1117   MCHC 34.0 03/29/2013 1117   RDW 16.8* 03/29/2013 1117   LYMPHSABS 0.7 10/07/2009 2251   MONOABS 0.5 10/07/2009 2251   EOSABS 0.2 10/07/2009 2251   BASOSABS 0.0 10/07/2009 2251   TSH- 1.7     03/31/2013 Diagnosis Endometrium, biopsy - BENIGN PROLIFERATIVE PHASE ENDOMETRIUM. - NEGATIVE FOR HYPERPLASIA OR MALIGNANCY. - BENIGN ENDOCERVICAL MUCOSA. - DETACHED FRAGMENTS OF SQUAMOUS EPITHELIUM; NEGATIVE FOR INTRAEPITHELIAL LESION OR MALIGNANCY. Assessment:     Menorrhagia- improved on Megace.  Given S.E. of Megace will decrease the dosage     Plan:     Info give on Novasure endometrial ablation Decrease Megace to 20mg  bid Pt to f/u in 3 months or sooner prn

## 2013-05-08 ENCOUNTER — Telehealth: Payer: Self-pay | Admitting: *Deleted

## 2013-05-08 NOTE — Telephone Encounter (Signed)
Called patient: no answer, left message that front desk would be scheduling her an appt for a surgical consult and to expect a phone call.  Informed patient that her medication request would need to contact her primary care provider or speak with Dr. Ihor Dow on the day of the surgical consult.

## 2013-05-08 NOTE — Telephone Encounter (Signed)
Pt called nurse line requesting a prescription for Xanax.  States her previous OB/GYN gave her this prescription and she is in need of a new one.  Also request a new pain pill prescription due to increase in pain, states she is on Ibuprofen 800 mg but she feels it no longer works for her because she has been taking it so long. Desires to schedule surgery for 06/2013.

## 2013-05-09 ENCOUNTER — Encounter: Payer: Self-pay | Admitting: Obstetrics & Gynecology

## 2013-05-21 ENCOUNTER — Telehealth: Payer: Self-pay | Admitting: *Deleted

## 2013-05-21 NOTE — Telephone Encounter (Signed)
Pt is having a surgical consult tomorrow with Dr. Ihor Dow, spoke with patient and Dr. Ihor Dow will go over all details of her surgery tomorrow and prescribe any medication needed. Pt verbalizes understanding.

## 2013-05-21 NOTE — Telephone Encounter (Signed)
Pt called nurse line this am requesting appointment for today for bleeding/cramps/back pain.  States she is having surgery tomorrow, but feels like she needs to be seen today.

## 2013-05-22 ENCOUNTER — Ambulatory Visit (INDEPENDENT_AMBULATORY_CARE_PROVIDER_SITE_OTHER): Payer: No Typology Code available for payment source | Admitting: Obstetrics & Gynecology

## 2013-05-22 ENCOUNTER — Encounter: Payer: Self-pay | Admitting: Obstetrics & Gynecology

## 2013-05-22 VITALS — BP 136/90 | HR 84 | Temp 99.5°F | Ht 65.0 in | Wt 193.0 lb

## 2013-05-22 DIAGNOSIS — F419 Anxiety disorder, unspecified: Secondary | ICD-10-CM

## 2013-05-22 DIAGNOSIS — F411 Generalized anxiety disorder: Secondary | ICD-10-CM

## 2013-05-22 DIAGNOSIS — R102 Pelvic and perineal pain: Secondary | ICD-10-CM

## 2013-05-22 DIAGNOSIS — N949 Unspecified condition associated with female genital organs and menstrual cycle: Secondary | ICD-10-CM

## 2013-05-22 MED ORDER — NAPROXEN 500 MG PO TABS: 500.0000 mg | ORAL_TABLET | Freq: Two times a day (BID) | ORAL | Status: DC

## 2013-05-22 MED ORDER — OXYCODONE-ACETAMINOPHEN 5-325 MG PO TABS: 1.0000 | ORAL_TABLET | Freq: Four times a day (QID) | ORAL | Status: DC | PRN

## 2013-05-22 MED ORDER — ALPRAZOLAM 1 MG PO TABS: 1.0000 mg | ORAL_TABLET | Freq: Every evening | ORAL | Status: DC | PRN

## 2013-05-22 NOTE — Progress Notes (Signed)
Subjective:     Patient ID: Jessica [name], female   DOB: [birth year], 48 y.o.   MRN: [id number]  HPI  Pain increased.  Wants to proceed with hyst  She reports that the pain is with and without bleeding.    Review of Systems     Objective:   Physical Exam BP 136/90  Pulse 84  Temp(Src) 99.5 F (37.5 C) (Oral)  Ht 5\' 5"  (1.651 m)  Wt 193 lb (87.544 kg)  BMI 32.12 kg/m2  LMP 05/18/2012  GU: EGBUS: no lesions Vagina: no blood in vault Cervix: no lesion; no mucopurulent d/c Uterus: enlarged and tender Adnexa: no masses; non tender        Assessment:     Pelvic pain- suspect adenomyosis     Plan:     To schedule RATH Xanax 1 mg q HS prn (no refills from this ofc) Percocet 5/325 mg 1 po q 6 hrs prn #30

## 2013-05-22 NOTE — Progress Notes (Signed)
Pt. States she is still taking Megace but began bleeding again 05/18/13. States she has 9/10 lower abdominal/lower back pain. States "its pressure too. I just want to push something out."

## 2013-05-22 NOTE — Patient Instructions (Signed)
Laparoscopically Assisted Vaginal Hysterectomy  A laparoscopically assisted vaginal hysterectomy (LAVH) is a surgical procedure to remove the uterus and cervix, and sometimes the ovaries and fallopian tubes. During an LAVH, some of the surgical removal is done through the vagina, and the rest is done through a few small surgical cuts (incisions) in the abdomen.  This procedure is usually considered in women when a vaginal hysterectomy is not an option. Your health care provider will discuss the risks and benefits of the different surgical techniques at your appointment. Generally, recovery time is faster and there are fewer complications after laparoscopic procedures than after open incisional procedures. LET YOUR HEALTH CARE PROVIDER KNOW ABOUT:   Any allergies you have.  All medicines you are taking, including vitamins, herbs, eye drops, creams, and over-the-counter medicines.  Previous problems you or members of your family have had with the use of anesthetics.  Any blood disorders you have.  Previous surgeries you have had.  Medical conditions you have. RISKS AND COMPLICATIONS Generally, this is a safe procedure. However, as with any procedure, complications can occur. Possible complications include:  Allergies to medicines.  Difficulty breathing.  Bleeding.  Infection.  Damage to other structures near your uterus and cervix. BEFORE THE PROCEDURE  Ask your health care provider about changing or stopping your regular medicines.  Take certain medicines, such as a colon-emptying preparation, as directed.  Do not eat or drink anything for at least 8 hours before your surgery.  Stop smoking if you smoke. Stopping will improve your health after surgery.  Arrange for a ride home after surgery and for help at home during recovery. PROCEDURE   An IV tube will be put into one of your veins in order to give you fluids and medicines.  You will receive medicines to relax you and  medicines that make you sleep (general anesthetic).  You may have a flexible tube (catheter) put into your bladder to drain urine.  You may have a tube put through your nose or mouth that goes into your stomach (nasogastric tube). The nasogastric tube removes digestive fluids and prevents you from feeling nauseated and from vomiting.  Tight-fitting (compression) stockings will be placed on your legs to promote circulation.  Three to four small incisions will be made in your abdomen. An incision also will be made in your vagina. Probes and tools will be inserted into the small incisions. The uterus and cervix are removed (and possibly your ovaries and fallopian tubes) through your vagina as well as through the small incisions that were made in the abdomen.  Your vagina is then sewn back to normal. AFTER THE PROCEDURE  You may have a liquid diet temporarily. You will most likely return to, and tolerate, your usual diet the day after surgery.  You will be passing urine through a catheter. It will be removed the day after surgery.  Your temperature, breathing rate, heart rate, blood pressure, and oxygen level will be monitored regularly.  You will still wear compression stockings on your legs until you are able to move around.  You will use a special device or do breathing exercises to keep your lungs clear.  You will be encouraged to walk as soon as possible. Document Released: 03/24/2011 Document Revised: 12/05/2012 Document Reviewed: 10/18/2012 [employer] Patient Information 2014 [employer].  

## 2013-05-23 ENCOUNTER — Encounter (HOSPITAL_COMMUNITY): Payer: Self-pay

## 2013-05-23 ENCOUNTER — Encounter (HOSPITAL_COMMUNITY)
Admission: RE | Admit: 2013-05-23 | Discharge: 2013-05-23 | Disposition: A | Discharge status: Home / Self Care | Payer: No Typology Code available for payment source | Source: Ambulatory Visit | Attending: Obstetrics & Gynecology | Admitting: Obstetrics & Gynecology

## 2013-05-23 DIAGNOSIS — Z01812 Encounter for preprocedural laboratory examination: Secondary | ICD-10-CM | POA: Insufficient documentation

## 2013-05-23 HISTORY — DX: Anxiety disorder, unspecified: F41.9

## 2013-05-23 HISTORY — DX: Unspecified asthma, uncomplicated: J45.909

## 2013-05-23 LAB — TYPE AND SCREEN
ABO/RH(D): O NEG
ANTIBODY SCREEN: NEGATIVE

## 2013-05-23 LAB — CBC
HCT: 44.5 % (ref 36.0–46.0)
Hemoglobin: 15.3 g/dL — ABNORMAL HIGH (ref 12.0–15.0)
MCH: 31.9 pg (ref 26.0–34.0)
MCHC: 34.4 g/dL (ref 30.0–36.0)
MCV: 92.9 fL (ref 78.0–100.0)
PLATELETS: 192 10*3/uL (ref 150–400)
RBC: 4.79 MIL/uL (ref 3.87–5.11)
RDW: 14.4 % (ref 11.5–15.5)
WBC: 14.4 10*3/uL — AB (ref 4.0–10.5)

## 2013-05-23 LAB — ABO/RH: ABO/RH(D): O NEG

## 2013-05-23 NOTE — Patient Instructions (Addendum)
   Your procedure is scheduled on: Friday, Feb 6  Enter through the [employer] [facility] at: 6 AM Pick up the phone at the desk and dial [phone] and inform us of your arrival.  Please call this number if you have any problems the morning of surgery: [phone]  Remember: Do not eat or drink after midnight: Thursday Take these medicines the morning of surgery with a SIP OF WATER:  Bring rescue inhaler with you to hospital on day of surgery.  Xanax if needed.  Do not wear jewelry, make-up, or FINGER nail polish No metal in your hair or on your body. Do not wear lotions, powders, perfumes.  You may wear deodorant.  Do not bring valuables to the hospital. Contacts, dentures or bridgework may not be worn into surgery.  Patients discharged on the day of surgery will not be allowed to drive home.  For patients being admitted to the hospital, checkout time is 11:00am the day of discharge. Home with significant other [name] [phone]

## 2013-05-24 ENCOUNTER — Encounter (HOSPITAL_COMMUNITY): Payer: Self-pay | Admitting: Anesthesiology

## 2013-05-24 ENCOUNTER — Encounter (HOSPITAL_COMMUNITY): Payer: No Typology Code available for payment source | Admitting: Anesthesiology

## 2013-05-24 ENCOUNTER — Encounter (HOSPITAL_COMMUNITY)
Admission: RE | Disposition: A | Discharge status: Home / Self Care | Payer: Self-pay | Source: Ambulatory Visit | Attending: Obstetrics & Gynecology

## 2013-05-24 ENCOUNTER — Ambulatory Visit (HOSPITAL_COMMUNITY): Payer: No Typology Code available for payment source | Admitting: Anesthesiology

## 2013-05-24 ENCOUNTER — Ambulatory Visit (HOSPITAL_COMMUNITY)
Admission: RE | Admit: 2013-05-24 | Discharge: 2013-05-24 | Disposition: A | Discharge status: Home / Self Care | Payer: No Typology Code available for payment source | Source: Ambulatory Visit | Attending: Obstetrics & Gynecology | Admitting: Obstetrics & Gynecology

## 2013-05-24 DIAGNOSIS — D251 Intramural leiomyoma of uterus: Secondary | ICD-10-CM | POA: Insufficient documentation

## 2013-05-24 DIAGNOSIS — J4489 Other specified chronic obstructive pulmonary disease: Secondary | ICD-10-CM | POA: Insufficient documentation

## 2013-05-24 DIAGNOSIS — N83209 Unspecified ovarian cyst, unspecified side: Secondary | ICD-10-CM | POA: Insufficient documentation

## 2013-05-24 DIAGNOSIS — D252 Subserosal leiomyoma of uterus: Secondary | ICD-10-CM | POA: Insufficient documentation

## 2013-05-24 DIAGNOSIS — N72 Inflammatory disease of cervix uteri: Secondary | ICD-10-CM | POA: Insufficient documentation

## 2013-05-24 DIAGNOSIS — N949 Unspecified condition associated with female genital organs and menstrual cycle: Secondary | ICD-10-CM

## 2013-05-24 DIAGNOSIS — J449 Chronic obstructive pulmonary disease, unspecified: Secondary | ICD-10-CM | POA: Insufficient documentation

## 2013-05-24 DIAGNOSIS — D259 Leiomyoma of uterus, unspecified: Secondary | ICD-10-CM

## 2013-05-24 DIAGNOSIS — N8000 Endometriosis of the uterus, unspecified: Secondary | ICD-10-CM | POA: Insufficient documentation

## 2013-05-24 DIAGNOSIS — N920 Excessive and frequent menstruation with regular cycle: Secondary | ICD-10-CM | POA: Insufficient documentation

## 2013-05-24 DIAGNOSIS — N838 Other noninflammatory disorders of ovary, fallopian tube and broad ligament: Secondary | ICD-10-CM | POA: Insufficient documentation

## 2013-05-24 DIAGNOSIS — D219 Benign neoplasm of connective and other soft tissue, unspecified: Secondary | ICD-10-CM

## 2013-05-24 DIAGNOSIS — R109 Unspecified abdominal pain: Secondary | ICD-10-CM | POA: Insufficient documentation

## 2013-05-24 DIAGNOSIS — R102 Pelvic and perineal pain: Secondary | ICD-10-CM

## 2013-05-24 DIAGNOSIS — F17200 Nicotine dependence, unspecified, uncomplicated: Secondary | ICD-10-CM | POA: Insufficient documentation

## 2013-05-24 HISTORY — PX: ROBOTIC ASSISTED TOTAL HYSTERECTOMY WITH BILATERAL SALPINGO OOPHERECTOMY: SHX6086

## 2013-05-24 HISTORY — PX: CYSTOSCOPY: SHX5120

## 2013-05-24 LAB — HCG, QUANTITATIVE, PREGNANCY: hCG, Beta Chain, Quant, S: <1 m[IU]/mL (ref <5)

## 2013-05-24 SURGERY — ROBOTIC ASSISTED TOTAL HYSTERECTOMY WITH BILATERAL SALPINGO OOPHORECTOMY
Anesthesia: General | Site: Urethra

## 2013-05-24 MED ORDER — PROMETHAZINE HCL 25 MG/ML IJ SOLN: 6.2500 mg | INTRAMUSCULAR | Status: DC | PRN

## 2013-05-24 MED ORDER — ZOLPIDEM TARTRATE 5 MG PO TABS: 5.0000 mg | ORAL_TABLET | Freq: Every evening | ORAL | Status: DC | PRN

## 2013-05-24 MED ORDER — ALBUTEROL SULFATE (2.5 MG/3ML) 0.083% IN NEBU
INHALATION_SOLUTION | RESPIRATORY_TRACT | Status: AC
  Filled 2013-05-24: qty 3

## 2013-05-24 MED ORDER — IBUPROFEN 800 MG PO TABS: 800.0000 mg | ORAL_TABLET | Freq: Three times a day (TID) | ORAL | Status: DC | PRN

## 2013-05-24 MED ORDER — CEFAZOLIN SODIUM-DEXTROSE 2-3 GM-% IV SOLR
INTRAVENOUS | Status: AC
  Filled 2013-05-24: qty 50

## 2013-05-24 MED ORDER — LACTATED RINGERS IV SOLN
INTRAVENOUS | Status: DC
  Administered 2013-05-24 (×3): via INTRAVENOUS

## 2013-05-24 MED ORDER — MORPHINE SULFATE 4 MG/ML IJ SOLN
1.0000 mg | INTRAMUSCULAR | Status: DC | PRN
  Administered 2013-05-24: 1 mg via INTRAVENOUS
  Filled 2013-05-24: qty 1

## 2013-05-24 MED ORDER — SODIUM CHLORIDE 0.9 % IJ SOLN
INTRAMUSCULAR | Status: AC
  Filled 2013-05-24: qty 10

## 2013-05-24 MED ORDER — NEOSTIGMINE METHYLSULFATE 1 MG/ML IJ SOLN
INTRAMUSCULAR | Status: DC | PRN
  Administered 2013-05-24: 3 mg via INTRAVENOUS

## 2013-05-24 MED ORDER — KETOROLAC TROMETHAMINE 30 MG/ML IJ SOLN: 30.0000 mg | Freq: Once | INTRAMUSCULAR | Status: DC

## 2013-05-24 MED ORDER — ARTIFICIAL TEARS OP OINT
TOPICAL_OINTMENT | OPHTHALMIC | Status: DC | PRN
  Administered 2013-05-24: 1 via OPHTHALMIC

## 2013-05-24 MED ORDER — DEXAMETHASONE SODIUM PHOSPHATE 10 MG/ML IJ SOLN
INTRAMUSCULAR | Status: AC
  Filled 2013-05-24: qty 1

## 2013-05-24 MED ORDER — ONDANSETRON HCL 4 MG/2ML IJ SOLN
INTRAMUSCULAR | Status: AC
  Filled 2013-05-24: qty 2

## 2013-05-24 MED ORDER — IPRATROPIUM BROMIDE HFA 17 MCG/ACT IN AERS
1.0000 | INHALATION_SPRAY | RESPIRATORY_TRACT | Status: DC | PRN
  Filled 2013-05-24: qty 12.9

## 2013-05-24 MED ORDER — MIDAZOLAM HCL 2 MG/2ML IJ SOLN
INTRAMUSCULAR | Status: AC
  Filled 2013-05-24: qty 2

## 2013-05-24 MED ORDER — METHYLENE BLUE 1 % INJ SOLN
INTRAMUSCULAR | Status: AC
  Filled 2013-05-24: qty 10

## 2013-05-24 MED ORDER — PROPOFOL 10 MG/ML IV EMUL
INTRAVENOUS | Status: AC
  Filled 2013-05-24: qty 20

## 2013-05-24 MED ORDER — SUFENTANIL CITRATE 50 MCG/ML IV SOLN
INTRAVENOUS | Status: DC | PRN
  Administered 2013-05-24: 10 ug via INTRAVENOUS
  Administered 2013-05-24: 5 ug via INTRAVENOUS
  Administered 2013-05-24: 20 ug via INTRAVENOUS
  Administered 2013-05-24: 5 ug via INTRAVENOUS
  Administered 2013-05-24: 10 ug via INTRAVENOUS

## 2013-05-24 MED ORDER — HYDROMORPHONE HCL PF 1 MG/ML IJ SOLN
0.2500 mg | INTRAMUSCULAR | Status: DC | PRN
  Administered 2013-05-24 (×2): 0.5 mg via INTRAVENOUS

## 2013-05-24 MED ORDER — LIDOCAINE HCL (CARDIAC) 20 MG/ML IV SOLN
INTRAVENOUS | Status: AC
  Filled 2013-05-24: qty 5

## 2013-05-24 MED ORDER — MIDAZOLAM HCL 5 MG/5ML IJ SOLN
INTRAMUSCULAR | Status: DC | PRN
  Administered 2013-05-24: 2 mg via INTRAVENOUS

## 2013-05-24 MED ORDER — ALBUTEROL SULFATE (2.5 MG/3ML) 0.083% IN NEBU
2.5000 mg | INHALATION_SOLUTION | Freq: Once | RESPIRATORY_TRACT | Status: AC
  Administered 2013-05-24: 2.5 mg via RESPIRATORY_TRACT

## 2013-05-24 MED ORDER — ONDANSETRON HCL 4 MG PO TABS: 4.0000 mg | ORAL_TABLET | Freq: Four times a day (QID) | ORAL | Status: DC | PRN

## 2013-05-24 MED ORDER — MEPERIDINE HCL 25 MG/ML IJ SOLN: 6.2500 mg | INTRAMUSCULAR | Status: DC | PRN

## 2013-05-24 MED ORDER — DEXAMETHASONE SODIUM PHOSPHATE 10 MG/ML IJ SOLN
INTRAMUSCULAR | Status: DC | PRN
  Administered 2013-05-24: 10 mg via INTRAVENOUS

## 2013-05-24 MED ORDER — STERILE WATER FOR IRRIGATION IR SOLN
Status: DC | PRN
  Administered 2013-05-24: 1000 mL

## 2013-05-24 MED ORDER — GLYCOPYRROLATE 0.2 MG/ML IJ SOLN
INTRAMUSCULAR | Status: DC | PRN
  Administered 2013-05-24: 0.6 mg via INTRAVENOUS
  Administered 2013-05-24: 0.2 mg via INTRAVENOUS

## 2013-05-24 MED ORDER — OXYCODONE-ACETAMINOPHEN 5-325 MG PO TABS
1.0000 | ORAL_TABLET | ORAL | Status: DC | PRN
  Administered 2013-05-24 (×2): 1 via ORAL
  Filled 2013-05-24 (×2): qty 1

## 2013-05-24 MED ORDER — KETOROLAC TROMETHAMINE 30 MG/ML IJ SOLN: 30.0000 mg | Freq: Four times a day (QID) | INTRAMUSCULAR | Status: DC

## 2013-05-24 MED ORDER — SUFENTANIL CITRATE 50 MCG/ML IV SOLN
INTRAVENOUS | Status: AC
  Filled 2013-05-24: qty 1

## 2013-05-24 MED ORDER — PROPOFOL 10 MG/ML IV BOLUS
INTRAVENOUS | Status: DC | PRN
  Administered 2013-05-24: 40 mg via INTRAVENOUS
  Administered 2013-05-24: 160 mg via INTRAVENOUS

## 2013-05-24 MED ORDER — LACTATED RINGERS IR SOLN
Status: DC | PRN
  Administered 2013-05-24: 3000 mL

## 2013-05-24 MED ORDER — LIDOCAINE HCL (CARDIAC) 20 MG/ML IV SOLN
INTRAVENOUS | Status: DC | PRN
  Administered 2013-05-24: 60 mg via INTRAVENOUS

## 2013-05-24 MED ORDER — ROCURONIUM BROMIDE 100 MG/10ML IV SOLN
INTRAVENOUS | Status: DC | PRN
  Administered 2013-05-24: 50 mg via INTRAVENOUS
  Administered 2013-05-24 (×2): 10 mg via INTRAVENOUS

## 2013-05-24 MED ORDER — METHYLENE BLUE 1 % INJ SOLN
INTRAMUSCULAR | Status: AC
Start: 2013-05-24 — End: 2013-05-24
  Filled 2013-05-24: qty 10

## 2013-05-24 MED ORDER — DEXTROSE-NACL 5-0.45 % IV SOLN: INTRAVENOUS | Status: DC

## 2013-05-24 MED ORDER — GLYCOPYRROLATE 0.2 MG/ML IJ SOLN
INTRAMUSCULAR | Status: AC
  Filled 2013-05-24: qty 4

## 2013-05-24 MED ORDER — GLYCOPYRROLATE 0.2 MG/ML IJ SOLN
INTRAMUSCULAR | Status: AC
  Filled 2013-05-24: qty 1

## 2013-05-24 MED ORDER — LACTATED RINGERS IV SOLN
INTRAVENOUS | Status: DC
  Administered 2013-05-24: 12:00:00 via INTRAVENOUS

## 2013-05-24 MED ORDER — NEOSTIGMINE METHYLSULFATE 1 MG/ML IJ SOLN
INTRAMUSCULAR | Status: AC
  Filled 2013-05-24: qty 1

## 2013-05-24 MED ORDER — ONDANSETRON HCL 4 MG/2ML IJ SOLN: 4.0000 mg | Freq: Four times a day (QID) | INTRAMUSCULAR | Status: DC | PRN

## 2013-05-24 MED ORDER — PANTOPRAZOLE SODIUM 40 MG IV SOLR
40.0000 mg | Freq: Every day | INTRAVENOUS | Status: DC
  Filled 2013-05-24: qty 40

## 2013-05-24 MED ORDER — BUPIVACAINE-EPINEPHRINE 0.25% -1:200000 IJ SOLN
INTRAMUSCULAR | Status: DC | PRN
  Administered 2013-05-24: 9 mL

## 2013-05-24 MED ORDER — KETOROLAC TROMETHAMINE 30 MG/ML IJ SOLN: 15.0000 mg | Freq: Once | INTRAMUSCULAR | Status: DC | PRN

## 2013-05-24 MED ORDER — ALPRAZOLAM 0.5 MG PO TABS: 1.0000 mg | ORAL_TABLET | Freq: Every evening | ORAL | Status: DC | PRN

## 2013-05-24 MED ORDER — BUDESONIDE-FORMOTEROL FUMARATE 160-4.5 MCG/ACT IN AERO
1.0000 | INHALATION_SPRAY | Freq: Two times a day (BID) | RESPIRATORY_TRACT | Status: DC
  Administered 2013-05-24: 1 via RESPIRATORY_TRACT
  Filled 2013-05-24: qty 6

## 2013-05-24 MED ORDER — ONDANSETRON HCL 4 MG/2ML IJ SOLN
INTRAMUSCULAR | Status: DC | PRN
  Administered 2013-05-24: 4 mg via INTRAVENOUS

## 2013-05-24 MED ORDER — HYDROMORPHONE HCL PF 1 MG/ML IJ SOLN
INTRAMUSCULAR | Status: AC
  Filled 2013-05-24: qty 1

## 2013-05-24 MED ORDER — CEFAZOLIN SODIUM-DEXTROSE 2-3 GM-% IV SOLR
2.0000 g | INTRAVENOUS | Status: AC
  Administered 2013-05-24: 2 g via INTRAVENOUS

## 2013-05-24 MED ORDER — MIDAZOLAM HCL 2 MG/2ML IJ SOLN: 0.5000 mg | Freq: Once | INTRAMUSCULAR | Status: DC | PRN

## 2013-05-24 MED ORDER — BUPIVACAINE HCL (PF) 0.25 % IJ SOLN
INTRAMUSCULAR | Status: AC
  Filled 2013-05-24: qty 20

## 2013-05-24 MED ORDER — DOCUSATE SODIUM 100 MG PO CAPS: 100.0000 mg | ORAL_CAPSULE | Freq: Two times a day (BID) | ORAL | Status: DC

## 2013-05-24 MED ORDER — KETOROLAC TROMETHAMINE 30 MG/ML IJ SOLN
30.0000 mg | Freq: Four times a day (QID) | INTRAMUSCULAR | Status: DC
  Administered 2013-05-24 (×2): 30 mg via INTRAVENOUS
  Filled 2013-05-24 (×2): qty 1

## 2013-05-24 MED ORDER — METHYLENE BLUE 1 % INJ SOLN
INTRAMUSCULAR | Status: DC | PRN
  Administered 2013-05-24: 5 mL via INTRAVENOUS

## 2013-05-24 SURGICAL SUPPLY — 63 items
BAG URINE DRAINAGE (UROLOGICAL SUPPLIES) ×4 IMPLANT
BARRIER ADHS 3X4 INTERCEED (GAUZE/BANDAGES/DRESSINGS) IMPLANT
BENZOIN TINCTURE PRP APPL 2/3 (GAUZE/BANDAGES/DRESSINGS) ×4 IMPLANT
CATH FOLEY 3WAY  5CC 16FR (CATHETERS) ×2
CATH FOLEY 3WAY 5CC 16FR (CATHETERS) ×2 IMPLANT
CLOSURE WOUND 1/2 X4 (GAUZE/BANDAGES/DRESSINGS) ×1
CLOTH BEACON ORANGE TIMEOUT ST (SAFETY) ×4 IMPLANT
CONT PATH 16OZ SNAP LID 3702 (MISCELLANEOUS) ×4 IMPLANT
COVER MAYO STAND STRL (DRAPES) ×4 IMPLANT
COVER TABLE BACK 60X90 (DRAPES) ×8 IMPLANT
COVER TIP SHEARS 8 DVNC (MISCELLANEOUS) ×2 IMPLANT
COVER TIP SHEARS 8MM DA VINCI (MISCELLANEOUS) ×2
DECANTER SPIKE VIAL GLASS SM (MISCELLANEOUS) ×4 IMPLANT
DERMABOND ADVANCED (GAUZE/BANDAGES/DRESSINGS) ×2
DERMABOND ADVANCED .7 DNX12 (GAUZE/BANDAGES/DRESSINGS) ×2 IMPLANT
DEVICE TROCAR PUNCTURE CLOSURE (ENDOMECHANICALS) IMPLANT
DRAPE HUG U DISPOSABLE (DRAPE) ×4 IMPLANT
DRAPE LG THREE QUARTER DISP (DRAPES) ×8 IMPLANT
DRAPE WARM FLUID 44X44 (DRAPE) ×4 IMPLANT
ELECT REM PT RETURN 9FT ADLT (ELECTROSURGICAL) ×4
ELECTRODE REM PT RTRN 9FT ADLT (ELECTROSURGICAL) ×2 IMPLANT
EVACUATOR SMOKE 8.L (FILTER) ×4 IMPLANT
GAUZE VASELINE 3X9 (GAUZE/BANDAGES/DRESSINGS) IMPLANT
GLOVE BIO SURGEON STRL SZ7 (GLOVE) ×8 IMPLANT
GLOVE BIOGEL PI IND STRL 7.0 (GLOVE) ×4 IMPLANT
GLOVE BIOGEL PI INDICATOR 7.0 (GLOVE) ×4
GOWN STRL REUS W/ TWL XL LVL3 (GOWN DISPOSABLE) ×2 IMPLANT
GOWN STRL REUS W/TWL LRG LVL3 (GOWN DISPOSABLE) ×4 IMPLANT
GOWN STRL REUS W/TWL XL LVL3 (GOWN DISPOSABLE) ×2
KIT ACCESSORY DA VINCI DISP (KITS) ×2
KIT ACCESSORY DVNC DISP (KITS) ×2 IMPLANT
LEGGING LITHOTOMY PAIR STRL (DRAPES) ×4 IMPLANT
NEEDLE INSUFFLATION 120MM (ENDOMECHANICALS) ×4 IMPLANT
OCCLUDER COLPOPNEUMO (BALLOONS) ×4 IMPLANT
PACK LAVH (CUSTOM PROCEDURE TRAY) ×4 IMPLANT
PAD PREP 24X48 CUFFED NSTRL (MISCELLANEOUS) ×8 IMPLANT
PLUG CATH AND CAP STER (CATHETERS) ×4 IMPLANT
PROTECTOR NERVE ULNAR (MISCELLANEOUS) ×8 IMPLANT
SET CYSTO W/LG BORE CLAMP LF (SET/KITS/TRAYS/PACK) IMPLANT
SET IRRIG TUBING LAPAROSCOPIC (IRRIGATION / IRRIGATOR) ×4 IMPLANT
SOLUTION ELECTROLUBE (MISCELLANEOUS) ×4 IMPLANT
STRIP CLOSURE SKIN 1/2X4 (GAUZE/BANDAGES/DRESSINGS) ×3 IMPLANT
SURGIFLO W/THROMBIN 8M KIT (HEMOSTASIS) IMPLANT
SUT VIC AB 0 CT1 27 (SUTURE) ×8
SUT VIC AB 0 CT1 27XBRD ANBCTR (SUTURE) ×4 IMPLANT
SUT VIC AB 0 CT1 27XBRD ANTBC (SUTURE) ×4 IMPLANT
SUT VICRYL 0 UR6 27IN ABS (SUTURE) ×8 IMPLANT
SUT VICRYL 4-0 PS2 18IN ABS (SUTURE) ×8 IMPLANT
SUT VLOC 180 0 9IN  GS21 (SUTURE) ×2
SUT VLOC 180 0 9IN GS21 (SUTURE) ×2 IMPLANT
SYR 50ML LL SCALE MARK (SYRINGE) ×4 IMPLANT
SYSTEM CONVERTIBLE TROCAR (TROCAR) ×4 IMPLANT
TIP RUMI ORANGE 6.7MMX12CM (TIP) IMPLANT
TIP UTERINE 5.1X6CM LAV DISP (MISCELLANEOUS) IMPLANT
TIP UTERINE 6.7X10CM GRN DISP (MISCELLANEOUS) ×4 IMPLANT
TIP UTERINE 6.7X6CM WHT DISP (MISCELLANEOUS) IMPLANT
TIP UTERINE 6.7X8CM BLUE DISP (MISCELLANEOUS) IMPLANT
TOWEL OR 17X24 6PK STRL BLUE (TOWEL DISPOSABLE) ×8 IMPLANT
TROCAR DILATING TIP 12MM 150MM (ENDOMECHANICALS) ×4 IMPLANT
TROCAR DISP BLADELESS 8 DVNC (TROCAR) ×2 IMPLANT
TROCAR DISP BLADELESS 8MM (TROCAR) ×2
TUBING FILTER THERMOFLATOR (ELECTROSURGICAL) ×4 IMPLANT
WATER STERILE IRR 1000ML POUR (IV SOLUTION) ×12 IMPLANT

## 2013-05-24 NOTE — Transfer of Care (Signed)
Immediate Anesthesia Transfer of Care Note  Patient: Jessica [name]  Procedure(s) Performed: Procedure(s): ROBOTIC ASSISTED TOTAL HYSTERECTOMY WITH BILATERAL SALPINGECTOMY (Bilateral) CYSTOSCOPY (N/A)  Patient Location: PACU  Anesthesia Type:General  Level of Consciousness: sedated  Airway & Oxygen Therapy: Patient Spontanous Breathing and Patient connected to face mask oxygen  Post-op Assessment: Report given to PACU RN and Post -op Vital signs reviewed and stable  Post vital signs: stable  Complications: No apparent anesthesia complications

## 2013-05-24 NOTE — Anesthesia Preprocedure Evaluation (Signed)
Anesthesia Evaluation  Patient identified by MRN, date of birth, ID band Patient awake    Reviewed: Allergy & Precautions, H&P , Patient's Chart, lab work & pertinent test results, reviewed documented beta blocker date and time   Airway Mallampati: II TM Distance: >3 FB Neck ROM: full    Dental no notable dental hx.    Pulmonary COPDCurrent Smoker,  breath sounds clear to auscultation  Pulmonary exam normal       Cardiovascular Rhythm:regular Rate:Normal     Neuro/Psych    GI/Hepatic   Endo/Other    Renal/GU      Musculoskeletal   Abdominal   Peds  Hematology   Anesthesia Other Findings Chest clear.   Reproductive/Obstetrics                           Anesthesia Physical Anesthesia Plan  ASA: II  Anesthesia Plan: General   Post-op Pain Management:    Induction: Intravenous  Airway Management Planned: Oral ETT  Additional Equipment:   Intra-op Plan:   Post-operative Plan: Extubation in OR  Informed Consent: I have reviewed the patients History and Physical, chart, labs and discussed the procedure including the risks, benefits and alternatives for the proposed anesthesia with the patient or authorized representative who has indicated his/her understanding and acceptance.   Dental Advisory Given and Dental advisory given  Plan Discussed with: CRNA and Surgeon  Anesthesia Plan Comments: (  Discussed general anesthesia, including possible nausea, instrumentation of airway, sore throat,pulmonary aspiration, etc. I asked if the were any outstanding questions, or  concerns before we proceeded. )        Anesthesia Quick Evaluation

## 2013-05-24 NOTE — H&P (Signed)
  Patient ID: Jessica [name], female DOB: 1965-05-04, 48 y.o. MRN: [id number]  HPI Pain increased. Wants to proceed with hyst  Review of Systems  Objective:   Physical Exam BP 136/90  Pulse 84  Temp(Src) 99.5 F (37.5 C) (Oral)  Ht 5\' 5"  (1.651 m)  Wt 193 lb (87.544 kg)  BMI 32.12 kg/m2  LMP 05/18/2012  CV: RRR Lungs: CTA Abs: soft, NT, ND GU: EGBUS: no lesions Vagina: no blood in vault Cervix: no lesion; no mucopurulent d/c Uterus: small, mobile TENDER Adnexa: no masses; non tender   CBC    Component Value Date/Time   WBC 14.4* 05/23/2013 1405   RBC 4.79 05/23/2013 1405   HGB 15.3* 05/23/2013 1405   HCT 44.5 05/23/2013 1405   PLT 192 05/23/2013 1405   MCV 92.9 05/23/2013 1405   MCH 31.9 05/23/2013 1405   MCHC 34.4 05/23/2013 1405   RDW 14.4 05/23/2013 1405   LYMPHSABS 0.7 10/07/2009 2251   MONOABS 0.5 10/07/2009 2251   EOSABS 0.2 10/07/2009 2251   BASOSABS 0.0 10/07/2009 2251   01/24/2013 EXAM:  TRANSVAGINAL ULTRASOUND OF PELVIS  TECHNIQUE:  Transvaginal ultrasound examination of the pelvis was performed  including evaluation of the uterus, ovaries, adnexal regions, and  pelvic cul-de-sac.  COMPARISON: None.  FINDINGS:  Uterus  Measurements: 10.6 x 6.7 x 3.2 cm. Anteverted. Small nabothian cyst.  There is a 1.8 x 1.0 x 1.5 cm intramural fibroid within the anterior  mid body of the uterus.  Endometrium  Thickness: Measures 6.4 mm. No focal abnormality visualized.  Right ovary  Measurements: Measures 3.6 x 2.3 x 2.2 cm. Normal appearance/no  adnexal mass.  Left ovary  Measurements: Measures 4.2 x 2.6 x 2.2 cm. Within the left ovary is  a 1.6 x 1.6 x 1.5 cm probable corpus luteum versus hemorrhagic  cyst.  Other findings: No free fluid  IMPRESSION:  1. Unremarkable endometrium. If bleeding remains unresponsive to  hormonal or medical therapy, focal lesion work-up with  sonohysterogram should be considered. Endometrial biopsy should also  be considered in pre-menopausal  patients at high risk for  endometrial carcinoma. (Ref: Radiological Reasoning: Algorithmic  Workup of Abnormal Vaginal Bleeding with Endovaginal Sonography and  Sonohysterography. AJR 2008; 637:C58-85)  2. Small uterine fibroid.   03/29/2013 Diagnosis Endometrium, biopsy - BENIGN PROLIFERATIVE PHASE ENDOMETRIUM. - NEGATIVE FOR HYPERPLASIA OR MALIGNANCY. - BENIGN ENDOCERVICAL MUCOSA. - DETACHED FRAGMENTS OF SQUAMOUS EPITHELIUM; NEGATIVE FOR INTRAEPITHELIAL LESION OR MALIGNANCY       Assessment:   Pt presents for definitive surgery for pelvic pain and bleeding  Plan:   Patient desires surgical management with robot assisted total laparoscopic hysterectomy with bilateral salpingectomy.  The risks of surgery were discussed in detail with the patient including but not limited to: bleeding which may require transfusion or reoperation; infection which may require prolonged hospitalization or re-hospitalization and antibiotic therapy; injury to bowel, bladder, ureters and major vessels or other surrounding organs; need for additional procedures including laparotomy; thromboembolic phenomenon, incisional problems and other postoperative or anesthesia complications.  Patient was told that the likelihood that her condition and symptoms will be treated effectively with this surgical management was very high; the postoperative expectations were also discussed in detail. The patient also understands the alternative treatment options which were discussed in full. All questions were answered.

## 2013-05-24 NOTE — Discharge Summary (Signed)
Physician Discharge Summary  Patient ID: Jessica [name] MRN: [id number] DOB/AGE: Mar 06, 1966 48 y.o.  Admit date: 05/24/2013 Discharge date: 05/24/2013  Admission Diagnoses: pelvic pain and menorrhagia  Discharge Diagnoses:  Same as admission Active Problems:   * No active hospital problems. *   Discharged Condition: good  Hospital Course: Pt was admitted for [facility] with bilateral salpingectomy.  She was observed >8 hours with adequate pain control.  She was able to void and tolerate a reg diet.  She ambulated without difficulty.  She reports that she was ready for discharge and she met all discharge criteria.  The questions of the pt and her husband were answered to their satisfaction.   Consults: None  Significant Diagnostic Studies: labs: CBC  Treatments: IV hydration, antibiotics: Ancef, analgesia: Morphine and Percocet and Toradol and surgery: Robot Assisted Total Laparoscopic Hysterectomy with bilateral salpingectomy  Discharge Exam: Blood pressure 117/75, pulse 69, temperature 98.1 F (36.7 C), temperature source Oral, resp. rate 18, height _0  (1.651 m), weight 194 lb (87.998 kg), last menstrual period 05/18/2012, SpO2 98.00%. General appearance: alert and no distress GI: soft, non-tender; bowel sounds normal; no masses,  no organomegaly Incision/Wound:post sites clean, dry and intact   Disposition:   Discharge Orders   Future Orders Complete By Expires   Call MD for:  difficulty breathing, headache or visual disturbances  As directed    Call MD for:  hives  As directed    Call MD for:  persistant dizziness or light-headedness  As directed    Call MD for:  persistant nausea and vomiting  As directed    Call MD for:  redness, tenderness, or signs of infection (pain, swelling, redness, odor or green/yellow discharge around incision site)  As directed    Call MD for:  severe uncontrolled pain  As directed    Call MD for:  temperature >100.4  As directed    Diet - low  sodium heart healthy  As directed    Discharge wound care:  As directed    Comments:     Keep incision clean and dry   Driving Restrictions  As directed    Comments:     No driving while on pain medications   Increase activity slowly  As directed    Lifting restrictions  As directed    Comments:     No lifting for 2 weeks.  No heavy lifting for 6 weeks   Sexual Activity Restrictions  As directed    Comments:     NOTHING in again for 8 full weeks       Medication List    STOP taking these medications       megestrol 20 MG tablet  Commonly known as:  MEGACE     naproxen 500 MG tablet  Commonly known as:  NAPROSYN      TAKE these medications       ALPRAZolam 1 MG tablet  Commonly known as:  XANAX  Take 1 tablet (1 mg total) by mouth at bedtime as needed for anxiety.     budesonide-formoterol 160-4.5 MCG/ACT inhaler  Commonly known as:  SYMBICORT  Inhale 1 puff into the lungs 2 (two) times daily.     ibuprofen 800 MG tablet  Commonly known as:  ADVIL,MOTRIN  Take 1 tablet (800 mg total) by mouth every 8 (eight) hours as needed.     ipratropium 17 MCG/ACT inhaler  Commonly known as:  ATROVENT HFA  Inhale 1 puff into the  lungs as needed for wheezing (SOB).     oxyCODONE-acetaminophen 5-325 MG per tablet  Commonly known as:  PERCOCET/ROXICET  Take 1-2 tablets by mouth every 6 (six) hours as needed.     zolpidem 10 MG tablet  Commonly known as:  AMBIEN  Take 10 mg by mouth at bedtime as needed for sleep.           Follow-up Information   Follow up with Lavonia Drafts, MD In 2 weeks.   Specialty:  Obstetrics and Gynecology   Contact information:   [place] [address] [phone]       Signed: Lavonia Drafts 05/24/2013, 6:44 PM

## 2013-05-24 NOTE — Discharge Instructions (Signed)
Laparoscopically Assisted Vaginal Hysterectomy, Care After °Refer to this sheet in the next few weeks. These instructions provide you with information on caring for yourself after your procedure. Your health care provider may also give you more specific instructions. Your treatment has been planned according to current medical practices, but problems sometimes occur. Call your health care provider if you have any problems or questions after your procedure. °WHAT TO EXPECT AFTER THE PROCEDURE °After your procedure, it is typical to have the following: °· Abdominal pain. You will be given pain medicine to control it. °· Sore throat from the breathing tube that was inserted during surgery. °HOME CARE INSTRUCTIONS °· Only take over-the-counter or prescription medicines for pain, discomfort, or fever as directed by your health care provider. °· Do not take aspirin. It can cause bleeding. °· Do not drive when taking pain medicine. °· Follow your health care provider's advice regarding diet, exercise, lifting, driving, and general activities. °· Resume your usual diet as directed and allowed. °· Get plenty of rest and sleep. °· Do not douche, use tampons, or have sexual intercourse for at least 6 weeks, or until your health care provider gives you permission. °· Change your bandages (dressings) as directed by your health care provider. °· Monitor your temperature and notify your health care provider of a fever. °· Take showers instead of baths for 2 3 weeks. °· Do not drink alcohol until your health care provider gives you permission. °· If you develop constipation, you may take a mild laxative with your health care provider's permission. Bran foods may help with constipation problems. Drinking enough fluids to keep your urine clear or pale yellow may help as well. °· Try to have someone home with you for 1 2 weeks to help around the house. °· Keep all of your follow-up appointments as directed by your health care  provider. °SEEK MEDICAL CARE IF:  °· You have swelling, redness, or increasing pain around your incision sites. °· You have pus coming from your incision. °· You notice a bad smell coming from your incision. °· Your incision breaks open. °· You feel dizzy or lightheaded. °· You have pain or bleeding when you urinate. °· You have persistent diarrhea. °· You have persistent nausea and vomiting. °· You have abnormal vaginal discharge. °· You have a rash. °· You have any type of abnormal reaction or develop an allergy to your medicine. °· You have poor pain control with your prescribed medicine. °SEEK IMMEDIATE MEDICAL CARE IF:  °· You have a fever. °· You have severe abdominal pain. °· You have chest pain. °· You have shortness of breath. °· You faint. °· You have pain, swelling, or redness in your leg. °· You have heavy vaginal bleeding with blood clots. °Document Released: 03/24/2011 Document Revised: 12/05/2012 Document Reviewed: 10/18/2012 °Exit[employer]® Patient Information ©2014 [employer]. ° °

## 2013-05-24 NOTE — Progress Notes (Signed)
Pt discharged home with significant other... Condition stable... No equipment... Taken to car via wheelchair by Janalyn Shy, RN.

## 2013-05-24 NOTE — Anesthesia Postprocedure Evaluation (Signed)
Anesthesia Post Note  Patient: Jessica [name]  Procedure(s) Performed: Procedure(s): ROBOTIC ASSISTED TOTAL HYSTERECTOMY WITH BILATERAL SALPINGECTOMY (Bilateral) CYSTOSCOPY (N/A)  Anesthesia type: General  Patient location: [facility]  Post pain: Pain level controlled  Post assessment: Post-op Vital signs reviewed  Last Vitals: BP 141/76  Pulse 91  Temp(Src) 36.6 C (Oral)  Resp 18  Ht 5\' 5"  (1.651 m)  Wt 194 lb (87.998 kg)  BMI 32.28 kg/m2  SpO2 99%  LMP 05/18/2012  Post vital signs: Reviewed  Level of consciousness: awake  Complications: No apparent anesthesia complications

## 2013-05-24 NOTE — Anesthesia Postprocedure Evaluation (Signed)
  Anesthesia Post Note  Patient: Jessica [name]  Procedure(s) Performed: Procedure(s) (LRB): ROBOTIC ASSISTED TOTAL HYSTERECTOMY WITH BILATERAL SALPINGECTOMY (Bilateral) CYSTOSCOPY (N/A)  Anesthesia type: GA  Patient location: PACU  Post pain: Pain level controlled  Post assessment: Post-op Vital signs reviewed  Last Vitals:  Filed Vitals:   05/24/13 0949  BP: 140/82  Pulse: 102  Temp: 36.5 C  Resp:     Post vital signs: Reviewed  Level of consciousness: sedated  Complications: No apparent anesthesia complications   * patient with coughing and wheezing.  Treated with neb. Likely smoking related

## 2013-05-24 NOTE — Brief Op Note (Signed)
05/24/2013  9:48 AM  PATIENT:  Jessica [name]  48 y.o. female  PRE-OPERATIVE DIAGNOSIS:  MENORRHAGIA, ABDOMINAL PAIN  POST-OPERATIVE DIAGNOSIS:  menorrhagia, abdominal pain  PROCEDURE:  Procedure(s): ROBOTIC ASSISTED TOTAL HYSTERECTOMY WITH BILATERAL SALPINGECTOMY (Bilateral) CYSTOSCOPY (N/A)  SURGEON:  Surgeon(s) and Role:    * Lavonia Drafts, MD - Primary    * Osborne Oman, MD - Assisting  ANESTHESIA:   local and general  EBL:  Total I/O In: 2200 [I.V.:2200] Out: 180 [Urine:30; Blood:150]  BLOOD ADMINISTERED:none  DRAINS: none   LOCAL MEDICATIONS USED:  MARCAINE     SPECIMEN:  Source of Specimen:  uterus, cervix and fallopian tubes bilaterally   DISPOSITION OF SPECIMEN:  PATHOLOGY  COUNTS:  YES  TOURNIQUET:  * No tourniquets in log *  DICTATION: .Note written in [place]: admit for 8 hour observation  PATIENT DISPOSITION:  PACU - hemodynamically stable.   Delay start of Pharmacological VTE agent (>24hrs) due to surgical blood loss or risk of bleeding: yes

## 2013-05-24 NOTE — Op Note (Signed)
05/24/2013  9:48 AM  PATIENT:  Jessica [name]  48 y.o. female  PRE-OPERATIVE DIAGNOSIS:  MENORRHAGIA, ABDOMINAL PAIN  POST-OPERATIVE DIAGNOSIS:  menorrhagia, abdominal pain  PROCEDURE:  Procedure(s): ROBOTIC ASSISTED TOTAL HYSTERECTOMY WITH BILATERAL SALPINGECTOMY (Bilateral) CYSTOSCOPY (N/A)  SURGEON:  Surgeon(s) and Role:    * Lavonia Drafts, MD - Primary    * Osborne Oman, MD - Assisting  ANESTHESIA:   local and general  EBL:  Total I/O In: 2200 [I.V.:2200] Out: 180 [Urine:30; Blood:150]  BLOOD ADMINISTERED:none  DRAINS: none   LOCAL MEDICATIONS USED:  MARCAINE     SPECIMEN:  Source of Specimen:  uterus, cervix and fallopian tubes bilaterally   DISPOSITION OF SPECIMEN:  PATHOLOGY  COUNTS:  YES  TOURNIQUET:  * No tourniquets in log *  DICTATION: .Note written in [place]: admit for 8 hour observation  PATIENT DISPOSITION:  PACU - hemodynamically stable.   Delay start of Pharmacological VTE agent (>24hrs) due to surgical blood loss or risk of bleeding: yes  The risks, benefits, and alternatives of surgery were explained, understood, and accepted. Consents were signed. All questions were answered. She was taken to the operating room and general anesthesia was applied without complication. She was placed in the dorsal lithotomy position and her abdomen and vagina were prepped and draped after she had been carefully positioned on the table. A bimanual exam revealed a 14 week size uterus that was mobile. Her adnexa were not enlarged. The cervix was measured and the uterus was sounded to 10 cm. A Rumi uterine manipulator was placed without difficulty. A Foley catheter was placed and it drained clear throughout the case. Gloves were changed and attention was turned to the abdomen. A 34mm incision was made in the umbilicus and a Veress needle was placed intraperitoneally. CO2 was used to insufflate the abdomen to approximately 4 L. After good  pneumoperitoneum was established, a 12 mm trocar was placed in the umbilicus.  Laparoscopy confirmed correct placement. She was placed in Trendelenburg position and ports were placed in appropriate positions on her abdomen to allow maximum exposure during the robotic case. Specifically there was an 81mm assistant port placed in the left lower quadrant under direct laproscopic visualization. Two 8 mm ports were placed 8cm lateral to the midline port.  These were all placed under direct laparoscopic visualization. The robot was docked and I proceeded with a robotic portion of the case.  The pelvis was inspected and the uterus was found to have fibroids and be enlarged.  The fallopian tubes and ovaries were found to be normal. The remainder of her pelvis appeared normal with the exception of adhesions of bowel to the adnexa and sidewall on the left side.  These were released sharply. The ureters and the infundibulopelvic ligaments were identified. I excised the fallopian tubes bilaterally. The round ligament on each side was cauterized and cut. The PK/gyrus instrument was used for this portion. The round ligaments were identified, cauterized and ligated, a bladder flap was created anteriorly. The uterine vessels were identified and cauterized and then cut.The bladder was pushed out of the operative site and an anterior colpotomy was made. The colpotomy incision was extended circumferentially, following the blue outline of the Rumi manipulator. All pedicles were hemostatic.  The uterus was removed from the vagina with the fallopian tube segments. The vaginal cuff was closed with v-lock suture.  Excellent hemostasis was noted throughout. The pelvis was irrigated. The intraabdominal pressure was lowered assess hemostasis. After  determining excellent hemostasis, the robot was undocked. At this point I performed cystoscopy. The cystoscopy revealed blue ejection from both ureters.  The midline fascial incision was closed  with 0 vicryl.  The skin from all of the other ports was closed with 3-0 vicryl. 30cc of 0.5% Marcaine was injected into the port sites.  The patient was then extubated and taken to recovery in stable condition.   Sponge, lap and needle counts were correct x 2.

## 2013-05-27 ENCOUNTER — Encounter (HOSPITAL_COMMUNITY): Payer: Self-pay | Admitting: Obstetrics & Gynecology

## 2013-05-28 ENCOUNTER — Telehealth: Payer: Self-pay | Admitting: *Deleted

## 2013-05-28 NOTE — Telephone Encounter (Signed)
Pt called nurse line requesting pain medication.  Pt is scheduled for surgery this Friday.

## 2013-05-29 ENCOUNTER — Telehealth: Payer: Self-pay | Admitting: *Deleted

## 2013-05-29 DIAGNOSIS — B3731 Acute candidiasis of vulva and vagina: Secondary | ICD-10-CM

## 2013-05-29 DIAGNOSIS — B373 Candidiasis of vulva and vagina: Secondary | ICD-10-CM

## 2013-05-29 MED ORDER — FLUCONAZOLE 150 MG PO TABS: 150.0000 mg | ORAL_TABLET | Freq: Every day | ORAL | Status: DC

## 2013-05-29 NOTE — Telephone Encounter (Signed)
Rx denied. See tel enc dated 05/29/13.

## 2013-05-29 NOTE — Telephone Encounter (Signed)
Patient is requesting a refill on her pain medicine. She had surgery on 05/24/13. Patient was given rx for Percocet on 05/22/13 with a quantity of 30.

## 2013-05-29 NOTE — Telephone Encounter (Signed)
I spoke with Dr. Ihor Dow, she stated that patient should not need another rx for percocet and should just use ibuprofen for the pain. I informed patient of this and she was agreeable to using the ibuprofen. She stated that she has a yeast infection so I sent a rx for Diflucan 150 to her pharmacy. Patient has no further questions or concerns.

## 2013-06-04 ENCOUNTER — Telehealth: Payer: Self-pay | Admitting: *Deleted

## 2013-06-04 ENCOUNTER — Inpatient Hospital Stay (HOSPITAL_COMMUNITY)
Admission: AD | Admit: 2013-06-04 | Discharge: 2013-06-06 | DRG: 863 | Disposition: A | Discharge status: Home / Self Care | Payer: No Typology Code available for payment source | Source: Ambulatory Visit | Attending: Obstetrics & Gynecology | Admitting: Obstetrics & Gynecology

## 2013-06-04 ENCOUNTER — Encounter (HOSPITAL_COMMUNITY): Payer: Self-pay | Admitting: *Deleted

## 2013-06-04 ENCOUNTER — Inpatient Hospital Stay (HOSPITAL_COMMUNITY): Payer: No Typology Code available for payment source

## 2013-06-04 DIAGNOSIS — J449 Chronic obstructive pulmonary disease, unspecified: Secondary | ICD-10-CM | POA: Diagnosis present

## 2013-06-04 DIAGNOSIS — Y836 Removal of other organ (partial) (total) as the cause of abnormal reaction of the patient, or of later complication, without mention of misadventure at the time of the procedure: Secondary | ICD-10-CM | POA: Diagnosis present

## 2013-06-04 DIAGNOSIS — R51 Headache: Secondary | ICD-10-CM | POA: Diagnosis present

## 2013-06-04 DIAGNOSIS — J4489 Other specified chronic obstructive pulmonary disease: Secondary | ICD-10-CM | POA: Diagnosis present

## 2013-06-04 DIAGNOSIS — T8140XA Infection following a procedure, unspecified, initial encounter: Secondary | ICD-10-CM

## 2013-06-04 DIAGNOSIS — N731 Chronic parametritis and pelvic cellulitis: Secondary | ICD-10-CM

## 2013-06-04 DIAGNOSIS — F17200 Nicotine dependence, unspecified, uncomplicated: Secondary | ICD-10-CM | POA: Diagnosis present

## 2013-06-04 DIAGNOSIS — R109 Unspecified abdominal pain: Secondary | ICD-10-CM

## 2013-06-04 DIAGNOSIS — R05 Cough: Secondary | ICD-10-CM | POA: Diagnosis present

## 2013-06-04 DIAGNOSIS — R059 Cough, unspecified: Secondary | ICD-10-CM | POA: Diagnosis present

## 2013-06-04 LAB — CBC WITH DIFFERENTIAL/PLATELET
BASOS ABS: 0 10*3/uL (ref 0.0–0.1)
BASOS PCT: 0 % (ref 0–1)
EOS ABS: 0.7 10*3/uL (ref 0.0–0.7)
EOS PCT: 4 % (ref 0–5)
HCT: 34.6 % — ABNORMAL LOW (ref 36.0–46.0)
Hemoglobin: 12 g/dL (ref 12.0–15.0)
Lymphocytes Relative: 5 % — ABNORMAL LOW (ref 12–46)
Lymphs Abs: 1.1 10*3/uL (ref 0.7–4.0)
MCH: 31.7 pg (ref 26.0–34.0)
MCHC: 34.7 g/dL (ref 30.0–36.0)
MCV: 91.3 fL (ref 78.0–100.0)
Monocytes Absolute: 1.1 10*3/uL — ABNORMAL HIGH (ref 0.1–1.0)
Monocytes Relative: 6 % (ref 3–12)
NEUTROS PCT: 85 % — AB (ref 43–77)
Neutro Abs: 16.7 10*3/uL — ABNORMAL HIGH (ref 1.7–7.7)
PLATELETS: 171 10*3/uL (ref 150–400)
RBC: 3.79 MIL/uL — ABNORMAL LOW (ref 3.87–5.11)
RDW: 13.6 % (ref 11.5–15.5)
WBC: 19.6 10*3/uL — ABNORMAL HIGH (ref 4.0–10.5)

## 2013-06-04 LAB — URINALYSIS, ROUTINE W REFLEX MICROSCOPIC
BILIRUBIN URINE: NEGATIVE
Glucose, UA: NEGATIVE mg/dL
Ketones, ur: NEGATIVE mg/dL
Leukocytes, UA: NEGATIVE
NITRITE: NEGATIVE
PROTEIN: NEGATIVE mg/dL
Urobilinogen, UA: 0.2 mg/dL (ref 0.0–1.0)
pH: 6 (ref 5.0–8.0)

## 2013-06-04 LAB — COMPREHENSIVE METABOLIC PANEL
ALBUMIN: 3.4 g/dL — AB (ref 3.5–5.2)
ALT: 14 U/L (ref 0–35)
AST: 12 U/L (ref 0–37)
Alkaline Phosphatase: 70 U/L (ref 39–117)
BUN: 10 mg/dL (ref 6–23)
CO2: 22 mEq/L (ref 19–32)
Calcium: 9.3 mg/dL (ref 8.4–10.5)
Chloride: 103 mEq/L (ref 96–112)
Creatinine, Ser: 0.76 mg/dL (ref 0.50–1.10)
GFR calc Af Amer: >90 mL/min (ref >90)
GFR calc non Af Amer: >90 mL/min (ref >90)
Glucose, Bld: 114 mg/dL — ABNORMAL HIGH (ref 70–99)
POTASSIUM: 3.8 meq/L (ref 3.7–5.3)
SODIUM: 137 meq/L (ref 137–147)
TOTAL PROTEIN: 7.1 g/dL (ref 6.0–8.3)
Total Bilirubin: 0.5 mg/dL (ref 0.3–1.2)

## 2013-06-04 LAB — URINE MICROSCOPIC-ADD ON

## 2013-06-04 MED ORDER — POLYETHYLENE GLYCOL 3350 17 G PO PACK
17.0000 g | PACK | Freq: Every day | ORAL | Status: DC
  Administered 2013-06-06: 17 g via ORAL
  Filled 2013-06-04 (×4): qty 1

## 2013-06-04 MED ORDER — OXYCODONE-ACETAMINOPHEN 5-325 MG PO TABS
1.0000 | ORAL_TABLET | Freq: Four times a day (QID) | ORAL | Status: DC | PRN
  Filled 2013-06-04: qty 2

## 2013-06-04 MED ORDER — IPRATROPIUM BROMIDE 0.02 % IN SOLN
0.5000 mg | Freq: Four times a day (QID) | RESPIRATORY_TRACT | Status: DC | PRN
  Filled 2013-06-04: qty 2.5

## 2013-06-04 MED ORDER — PIPERACILLIN-TAZOBACTAM 3.375 G IVPB
3.3750 g | Freq: Three times a day (TID) | INTRAVENOUS | Status: DC
  Administered 2013-06-04 – 2013-06-06 (×6): 3.375 g via INTRAVENOUS
  Filled 2013-06-04 (×8): qty 50

## 2013-06-04 MED ORDER — IOHEXOL 300 MG/ML  SOLN
50.0000 mL | INTRAMUSCULAR | Status: AC
  Administered 2013-06-04: 50 mL via INTRAVENOUS

## 2013-06-04 MED ORDER — BUDESONIDE-FORMOTEROL FUMARATE 160-4.5 MCG/ACT IN AERO
1.0000 | INHALATION_SPRAY | Freq: Two times a day (BID) | RESPIRATORY_TRACT | Status: DC
  Administered 2013-06-04 – 2013-06-05 (×2): 1 via RESPIRATORY_TRACT
  Filled 2013-06-04: qty 6

## 2013-06-04 MED ORDER — IPRATROPIUM BROMIDE HFA 17 MCG/ACT IN AERS: 1.0000 | INHALATION_SPRAY | RESPIRATORY_TRACT | Status: DC | PRN

## 2013-06-04 MED ORDER — ALPRAZOLAM 0.25 MG PO TABS
1.0000 mg | ORAL_TABLET | Freq: Every evening | ORAL | Status: DC | PRN
  Administered 2013-06-05 – 2013-06-06 (×2): 1 mg via ORAL
  Filled 2013-06-04 (×2): qty 4

## 2013-06-04 MED ORDER — OXYCODONE-ACETAMINOPHEN 5-325 MG PO TABS
1.0000 | ORAL_TABLET | Freq: Four times a day (QID) | ORAL | Status: DC | PRN
  Administered 2013-06-04 – 2013-06-05 (×4): 2 via ORAL
  Filled 2013-06-04 (×4): qty 2

## 2013-06-04 MED ORDER — CALCIUM CARBONATE ANTACID 500 MG PO CHEW
1.0000 | CHEWABLE_TABLET | Freq: Every day | ORAL | Status: DC
  Administered 2013-06-06: 200 mg via ORAL

## 2013-06-04 MED ORDER — NICOTINE 14 MG/24HR TD PT24
14.0000 mg | MEDICATED_PATCH | Freq: Every day | TRANSDERMAL | Status: DC
  Administered 2013-06-04 – 2013-06-06 (×3): 14 mg via TRANSDERMAL
  Filled 2013-06-04 (×4): qty 1

## 2013-06-04 MED ORDER — SODIUM CHLORIDE 0.9 % IV SOLN
INTRAVENOUS | Status: DC
  Administered 2013-06-04: 16:00:00 via INTRAVENOUS
  Administered 2013-06-05: 75 mL via INTRAVENOUS
  Administered 2013-06-05 – 2013-06-06 (×2): via INTRAVENOUS

## 2013-06-04 MED ORDER — LACTATED RINGERS IV SOLN: INTRAVENOUS | Status: DC

## 2013-06-04 MED ORDER — ZOLPIDEM TARTRATE 5 MG PO TABS
5.0000 mg | ORAL_TABLET | Freq: Every evening | ORAL | Status: DC | PRN
  Administered 2013-06-05 – 2013-06-06 (×2): 5 mg via ORAL
  Filled 2013-06-04 (×2): qty 1

## 2013-06-04 MED ORDER — IOHEXOL 300 MG/ML  SOLN
100.0000 mL | Freq: Once | INTRAMUSCULAR | Status: AC | PRN
  Administered 2013-06-04: 100 mL via INTRAVENOUS

## 2013-06-04 MED ORDER — ACETAMINOPHEN 325 MG PO TABS
650.0000 mg | ORAL_TABLET | Freq: Once | ORAL | Status: AC
  Administered 2013-06-04: 650 mg via ORAL
  Filled 2013-06-04: qty 2

## 2013-06-04 MED ORDER — CALCIUM CARBONATE ANTACID 500 MG PO CHEW
2.0000 | CHEWABLE_TABLET | ORAL | Status: DC | PRN
  Administered 2013-06-04: 400 mg via ORAL
  Filled 2013-06-04 (×2): qty 1

## 2013-06-04 MED ORDER — FAMOTIDINE 20 MG PO TABS
20.0000 mg | ORAL_TABLET | Freq: Two times a day (BID) | ORAL | Status: DC | PRN
  Administered 2013-06-05: 20 mg via ORAL
  Filled 2013-06-04 (×2): qty 1

## 2013-06-04 NOTE — Telephone Encounter (Signed)
See previous note

## 2013-06-04 NOTE — H&P (Signed)
First Provider Initiated Contact with Patient 06/04/13 1459      Chief Complaint:  Post-op Problem   WALESKA BUTTERY is  48 y.o. G0P0.  Patient's last menstrual period was 03/18/2013.Marland Kitchen  Her pregnancy status is negative.  She presents complaining of fever and worsening abdominal pain. Pt s/p RATH with bilateral salpingectomy on 05/24/2013. Pt reports improving pain until last night where she had acutely worsening pain and temperature to 100.2. Pt woke this AM at approximately 4am with rigors and temp to 102.1. Pt took motrin and improved since taking. Pt reports associated headache, minimal vaginal bleeding without change in discharge. Pt also reports bladder discomfort and bowel discomfort worsening since yesterday.  Pt reports mild increased cough with sputum production but not a large amount. Pt denies other symptoms at this time. NO diarrhea or constipation, no pain with urination, no CP/significant SOB. No N/V.   OB History   Grav Para Term Preterm Abortions TAB SAB Ect Mult Living   0                Past Medical History  Diagnosis Date  . Anxiety   . Asthma   . COPD (chronic obstructive pulmonary disease) 2011    does not use inhaler ofter    Past Surgical History  Procedure Laterality Date  . Polyp removal     . Tonsillectomy    . Wisdom tooth extraction    . Robotic assisted total hysterectomy with bilateral salpingo oopherectomy Bilateral 05/24/2013    Procedure: ROBOTIC ASSISTED TOTAL HYSTERECTOMY WITH BILATERAL SALPINGECTOMY;  Surgeon: Lavonia Drafts, MD;  Location: [facility];  Service: Gynecology;  Laterality: Bilateral;  . Cystoscopy N/A 05/24/2013    Procedure: CYSTOSCOPY;  Surgeon: Lavonia Drafts, MD;  Location: [facility];  Service: Gynecology;  Laterality: N/A;  . Abdominal hysterectomy      Family History  Problem Relation Age of Onset  . Heart disease Mother   . Heart disease Father     History  Substance Use Topics  . Smoking status: Current Every  Day Smoker -- 0.75 packs/day for 16 years    Types: Cigarettes  . Smokeless tobacco: Never Used  . Alcohol Use: Yes     Comment: occasional     Allergies: No Known Allergies  Prescriptions prior to admission  Medication Sig Dispense Refill  . ALPRAZolam (XANAX) 1 MG tablet Take 1 tablet (1 mg total) by mouth at bedtime as needed for anxiety.  30 tablet  0  . budesonide-formoterol (SYMBICORT) 160-4.5 MCG/ACT inhaler Inhale 1 puff into the lungs 2 (two) times daily.       . calcium carbonate (TUMS - DOSED IN MG ELEMENTAL CALCIUM) 500 MG chewable tablet Chew 1 tablet by mouth daily.      Marland Kitchen ibuprofen (ADVIL,MOTRIN) 800 MG tablet Take 1 tablet (800 mg total) by mouth every 8 (eight) hours as needed.  30 tablet  0  . ipratropium (ATROVENT HFA) 17 MCG/ACT inhaler Inhale 1 puff into the lungs as needed for wheezing (SOB).      Marland Kitchen oxyCODONE-acetaminophen (PERCOCET/ROXICET) 5-325 MG per tablet Take 1-2 tablets by mouth every 6 (six) hours as needed.  30 tablet  0  . polyethylene glycol (MIRALAX / GLYCOLAX) packet Take 17 g by mouth daily.         Review of Systems  +HA, fever, chills, cough (hx of COPD) mildly worse than baseline, bowel and bladder tenderness. Abdominal pain.  No constipation, weakness, cp, constipation, n/v.    Physical  Exam   Blood pressure 128/77, pulse 104, temperature 99.4 F (37.4 C), temperature source Oral, resp. rate 20, last menstrual period 03/18/2013.  General: General appearance - alert, well appearing, and in no distress, oriented to person, place, and time, normal appearing weight and well hydrated Chest - clear to auscultation, no wheezes, rales or rhonchi, symmetric air entry Heart - normal rate, regular rhythm, normal S1, S2, no murmurs, rubs, clicks or gallops Abdomen - Acutely tender at incision sites. Worse tenderness suprapublicly than other sites. Does not seem out of proportion. Neg percussion.  Pelvic - minimal serous sanguinous discharge. No obvious  deffect in cuf Extremities - peripheral pulses normal, no pedal edema, no clubbing or cyanosis  Labs: Results for orders placed during the hospital encounter of 06/04/13 (from the past 24 hour(s))  URINALYSIS, ROUTINE W REFLEX MICROSCOPIC   Collection Time    06/04/13  2:20 PM      Result Value Ref Range   Color, Urine YELLOW  YELLOW   APPearance HAZY (*) CLEAR   Specific Gravity, Urine >1.030 (*) 1.005 - 1.030   pH 6.0  5.0 - 8.0   Glucose, UA NEGATIVE  NEGATIVE mg/dL   Hgb urine dipstick MODERATE (*) NEGATIVE   Bilirubin Urine NEGATIVE  NEGATIVE   Ketones, ur NEGATIVE  NEGATIVE mg/dL   Protein, ur NEGATIVE  NEGATIVE mg/dL   Urobilinogen, UA 0.2  0.0 - 1.0 mg/dL   Nitrite NEGATIVE  NEGATIVE   Leukocytes, UA NEGATIVE  NEGATIVE  URINE MICROSCOPIC-ADD ON   Collection Time    06/04/13  2:20 PM      Result Value Ref Range   Squamous Epithelial / LPF FEW (*) RARE   WBC, UA 3-6  <3 WBC/hpf   RBC / HPF 0-2  <3 RBC/hpf   Urine-Other MUCOUS PRESENT    CBC WITH DIFFERENTIAL   Collection Time    06/04/13  3:04 PM      Result Value Ref Range   WBC 19.6 (*) 4.0 - 10.5 K/uL   RBC 3.79 (*) 3.87 - 5.11 MIL/uL   Hemoglobin 12.0  12.0 - 15.0 g/dL   HCT 34.6 (*) 36.0 - 46.0 %   MCV 91.3  78.0 - 100.0 fL   MCH 31.7  26.0 - 34.0 pg   MCHC 34.7  30.0 - 36.0 g/dL   RDW 13.6  11.5 - 15.5 %   Platelets 171  150 - 400 K/uL   Neutrophils Relative % 85 (*) 43 - 77 %   Neutro Abs 16.7 (*) 1.7 - 7.7 K/uL   Lymphocytes Relative 5 (*) 12 - 46 %   Lymphs Abs 1.1  0.7 - 4.0 K/uL   Monocytes Relative 6  3 - 12 %   Monocytes Absolute 1.1 (*) 0.1 - 1.0 K/uL   Eosinophils Relative 4  0 - 5 %   Eosinophils Absolute 0.7  0.0 - 0.7 K/uL   Basophils Relative 0  0 - 1 %   Basophils Absolute 0.0  0.0 - 0.1 K/uL  COMPREHENSIVE METABOLIC PANEL   Collection Time    06/04/13  3:11 PM      Result Value Ref Range   Sodium 137  137 - 147 mEq/L   Potassium 3.8  3.7 - 5.3 mEq/L   Chloride 103  96 - 112 mEq/L    CO2 22  19 - 32 mEq/L   Glucose, Bld 114 (*) 70 - 99 mg/dL   BUN 10  6 - 23 mg/dL  Creatinine, Ser 0.76  0.50 - 1.10 mg/dL   Calcium 9.3  8.4 - 10.5 mg/dL   Total Protein 7.1  6.0 - 8.3 g/dL   Albumin 3.4 (*) 3.5 - 5.2 g/dL   AST 12  0 - 37 U/L   ALT 14  0 - 35 U/L   Alkaline Phosphatase 70  39 - 117 U/L   Total Bilirubin 0.5  0.3 - 1.2 mg/dL   GFR calc non Af Amer >90  >90 mL/min   GFR calc Af Amer >90  >90 mL/min   Imaging Studies:  No results found. The bony structures are unremarkable.  IMPRESSION:  1. Postoperative changes in the pelvis from a recent hysterectomy.  Inflammatory changes in the right lower pelvis are noted mainly  surrounding the right ovary and adjacent small bowel loops. Suspect  a small (21 x 18 mm) developing abscess on image number 71 but no  discrete drainable abscess is identified.  2. Normal left ovary. Normal appendix.  3. The abdomen is unremarkable except for mild splenomegaly.  Electronically Signed  By: Kalman Jewels M.D.  Assessment: Patient Active Problem List   Diagnosis Date Noted  . Postoperative infection 06/04/2013  . HYPERTRIGLYCERIDEMIA 10/07/2009  . SINUSITIS 10/07/2009  . MENORRHAGIA 10/07/2009  . TOBACCO ABUSE 06/22/2009  . OVARIAN CYST 06/22/2009  . ELEVATED BLOOD PRESSURE WITHOUT DIAGNOSIS OF HYPERTENSION 06/22/2009  . CHRONIC OBSTRUCTIVE PULMONARY DISEASE 06/11/2009   Pt with forming abscess near right ovary. Not drainable. - start zosyn 3.75g q 8hr - percocet PRN for pain - monitor for improvement. - CBC in AM - blood cultures prior to abx  COPD Continue home meds  FEN: LR 100cc/hr / Replete PRN/ Regular diet PPX: SCDs, ambulate, miralax Dispo: monitor for 24hr afebrile and transition to PO antibiotics.   Donnah Levert, Lyda Kalata

## 2013-06-04 NOTE — H&P (Signed)
Attestation of Attending Supervision of Fellow: Evaluation and management procedures were performed by the Fellow under my supervision and collaboration.  I have reviewed the Fellow's note and chart, and I agree with the management and plan.    

## 2013-06-04 NOTE — MAU Note (Signed)
Radiology in and pt starting to drink oral contrast

## 2013-06-04 NOTE — MAU Note (Addendum)
Started running fever at 0100. Highest at 0400 102.1; took some ibuprofen, has helped. Is having lower abd pain and pressure. Had hysterectomy on 02/06.  Feels bloated

## 2013-06-04 NOTE — Progress Notes (Signed)
Dr Leslie Andrea notified of pt's admission and status. Will see pt

## 2013-06-04 NOTE — Telephone Encounter (Signed)
Pt called nurse line and informed of having a fever last night of 102.  Pt is having increase in pain/pressure.  Temperature this am is 99.5.  Desires call back.  Spoke with patient concerning fever and pain.  Pt states she feels like she would have a fever of 102 if not on motrin.  Pain has increased in  Last day, encouraged to go to MAU to be evaluated. Pt verbalizes understanding.

## 2013-06-04 NOTE — MAU Note (Signed)
Incisions clean and dry and are healing

## 2013-06-05 LAB — CBC WITH DIFFERENTIAL/PLATELET
BASOS ABS: 0 10*3/uL (ref 0.0–0.1)
Basophils Relative: 0 % (ref 0–1)
EOS ABS: 0.9 10*3/uL — AB (ref 0.0–0.7)
EOS PCT: 5 % (ref 0–5)
HCT: 34.1 % — ABNORMAL LOW (ref 36.0–46.0)
Hemoglobin: 11.7 g/dL — ABNORMAL LOW (ref 12.0–15.0)
Lymphocytes Relative: 11 % — ABNORMAL LOW (ref 12–46)
Lymphs Abs: 1.8 10*3/uL (ref 0.7–4.0)
MCH: 31.6 pg (ref 26.0–34.0)
MCHC: 34.3 g/dL (ref 30.0–36.0)
MCV: 92.2 fL (ref 78.0–100.0)
MONO ABS: 0.9 10*3/uL (ref 0.1–1.0)
Monocytes Relative: 5 % (ref 3–12)
NEUTROS ABS: 12.9 10*3/uL — AB (ref 1.7–7.7)
Neutrophils Relative %: 79 % — ABNORMAL HIGH (ref 43–77)
Platelets: 164 10*3/uL (ref 150–400)
RBC: 3.7 MIL/uL — ABNORMAL LOW (ref 3.87–5.11)
RDW: 13.9 % (ref 11.5–15.5)
WBC: 16.4 10*3/uL — ABNORMAL HIGH (ref 4.0–10.5)

## 2013-06-05 MED ORDER — FLUCONAZOLE 150 MG PO TABS: 150.0000 mg | ORAL_TABLET | Freq: Once | ORAL | Status: DC

## 2013-06-05 MED ORDER — IBUPROFEN 600 MG PO TABS: 600.0000 mg | ORAL_TABLET | Freq: Four times a day (QID) | ORAL | Status: DC | PRN

## 2013-06-05 MED ORDER — METRONIDAZOLE 500 MG PO TABS: 500.0000 mg | ORAL_TABLET | Freq: Two times a day (BID) | ORAL | Status: AC

## 2013-06-05 MED ORDER — OXYCODONE HCL 5 MG PO TABS
5.0000 mg | ORAL_TABLET | ORAL | Status: DC | PRN
  Administered 2013-06-06 (×2): 5 mg via ORAL
  Filled 2013-06-05 (×2): qty 1

## 2013-06-05 MED ORDER — OXYCODONE-ACETAMINOPHEN 5-325 MG PO TABS
1.0000 | ORAL_TABLET | ORAL | Status: DC | PRN
  Administered 2013-06-05: 2 via ORAL
  Administered 2013-06-06 (×2): 1 via ORAL
  Filled 2013-06-05 (×2): qty 1

## 2013-06-05 MED ORDER — CIPROFLOXACIN HCL 500 MG PO TABS: 500.0000 mg | ORAL_TABLET | Freq: Two times a day (BID) | ORAL | Status: DC

## 2013-06-05 MED ORDER — OXYCODONE-ACETAMINOPHEN 5-325 MG PO TABS: 1.0000 | ORAL_TABLET | Freq: Four times a day (QID) | ORAL | Status: DC | PRN

## 2013-06-05 NOTE — Progress Notes (Signed)
UR completed 

## 2013-06-05 NOTE — Care Management Note (Unsigned)
    Page 1 of 1   06/05/2013     3:32:53 PM   CARE MANAGEMENT NOTE 06/05/2013  Patient:  Jessica [name], Jessica [name]   Account Number:  [id number]  Date Initiated:  06/05/2013  Documentation initiated by:  Dicie Beam  Subjective/Objective Assessment:   Status post No Name with bilateral salpingectomy.  She now has a small pelvic abscess.     Action/Plan:   Medication assistance.   Anticipated DC Date:  06/07/2013   Anticipated DC Plan:  [place]  CM consult  Medication Assistance    Status of service:  In process, will continue to follow  Discharge Disposition:  HOME/SELF CARE  Comments:  06/05/13  1500p Spoke w/ pt at bedside in room 305.  Pt does have [place] which is active 05/23/13 to 11/20/13.  Pt stated that she had gotten a text from her pharmacy ([facility] on [address]) stating that 4 prescriptions were ready and the cost was $34.00.  Pt not sure what medications were filled. Pt is to check w/ [facility] to see what medications were filled.  Pt stated that she has an appt on Monday to establish a primary care physician but she is not sure of where.  CM will follow up w/ [facility] to see if this is where the pt has an appt on Monday.  CM will follow up w/ the pt in am of 2/19.  Pt voiced agreement.  Discussed w/ pt's Nurse and aware of plan.  [place]  [phone]   06/05/2013 0928a CM noted CM referral for medication assistance.  Per pt's face sheet, she has [employer].  Will follow up w/ pt regarding using the [facility].  Pt's Nurse aware that CM following up.  TJohnson, RNBSN

## 2013-06-05 NOTE — Progress Notes (Signed)
Consult completed.  Full assessment to follow.
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
## 2013-06-06 NOTE — Progress Notes (Signed)
Pt discharged home with father... Condition stable... No equipment... Ambulated to car with L. Graylon Good, NT.

## 2013-06-06 NOTE — Discharge Instructions (Signed)
Laparoscopically Assisted Vaginal Hysterectomy  A laparoscopically assisted vaginal hysterectomy (LAVH) is a surgical procedure to remove the uterus and cervix, and sometimes the ovaries and fallopian tubes. During an LAVH, some of the surgical removal is done through the vagina, and the rest is done through a few small surgical cuts (incisions) in the abdomen.  This procedure is usually considered in women when a vaginal hysterectomy is not an option. Your health care provider will discuss the risks and benefits of the different surgical techniques at your appointment. Generally, recovery time is faster and there are fewer complications after laparoscopic procedures than after open incisional procedures. LET YOUR HEALTH CARE PROVIDER KNOW ABOUT:   Any allergies you have.  All medicines you are taking, including vitamins, herbs, eye drops, creams, and over-the-counter medicines.  Previous problems you or members of your family have had with the use of anesthetics.  Any blood disorders you have.  Previous surgeries you have had.  Medical conditions you have. RISKS AND COMPLICATIONS Generally, this is a safe procedure. However, as with any procedure, complications can occur. Possible complications include:  Allergies to medicines.  Difficulty breathing.  Bleeding.  Infection.  Damage to other structures near your uterus and cervix. BEFORE THE PROCEDURE  Ask your health care provider about changing or stopping your regular medicines.  Take certain medicines, such as a colon-emptying preparation, as directed.  Do not eat or drink anything for at least 8 hours before your surgery.  Stop smoking if you smoke. Stopping will improve your health after surgery.  Arrange for a ride home after surgery and for help at home during recovery. PROCEDURE   An IV tube will be put into one of your veins in order to give you fluids and medicines.  You will receive medicines to relax you and  medicines that make you sleep (general anesthetic).  You may have a flexible tube (catheter) put into your bladder to drain urine.  You may have a tube put through your nose or mouth that goes into your stomach (nasogastric tube). The nasogastric tube removes digestive fluids and prevents you from feeling nauseated and from vomiting.  Tight-fitting (compression) stockings will be placed on your legs to promote circulation.  Three to four small incisions will be made in your abdomen. An incision also will be made in your vagina. Probes and tools will be inserted into the small incisions. The uterus and cervix are removed (and possibly your ovaries and fallopian tubes) through your vagina as well as through the small incisions that were made in the abdomen.  Your vagina is then sewn back to normal. AFTER THE PROCEDURE  You may have a liquid diet temporarily. You will most likely return to, and tolerate, your usual diet the day after surgery.  You will be passing urine through a catheter. It will be removed the day after surgery.  Your temperature, breathing rate, heart rate, blood pressure, and oxygen level will be monitored regularly.  You will still wear compression stockings on your legs until you are able to move around.  You will use a special device or do breathing exercises to keep your lungs clear.  You will be encouraged to walk as soon as possible. Document Released: 03/24/2011 Document Revised: 12/05/2012 Document Reviewed: 10/18/2012 ExitCare Patient Information 2014 [employer].  

## 2013-06-06 NOTE — Discharge Summary (Signed)
Physician Discharge Summary  Patient ID: Jessica [name] MRN: [id number] DOB/AGE: June 17, 1965 48 y.o.  Admit date: 06/04/2013 Discharge date: 06/06/2013  Admission Diagnoses:postoperative pelvic abscess  Discharge Diagnoses: same Active Problems:   Postoperative infection   Discharged Condition: fair  Hospital Course: Admitted 2/17/ with 2 cm pelvic abscess for IV antibiotic and pain management after RATH on 05/24/13 by Dr. Ihor Dow. She reported pain and fever at home  Consults: None  Significant Diagnostic Studies: [place]  Treatments: antibiotics: Zosyn  Discharge Exam: Blood pressure 115/74, pulse 73, temperature 98.6 F (37 C), temperature source Oral, resp. rate 16, height 5\' 5"  (1.651 m), weight 190 lb (86.183 kg), last menstrual period 03/18/2013, SpO2 96.00%. General appearance: alert, cooperative and no distress GI: soft, non-tender; bowel sounds normal; no masses,  no organomegaly  Disposition: 01-Home or Self Care   Future Appointments Provider Department Dept Phone   06/10/2013 1:00 PM Lavonia Drafts, MD [facility] [phone]   06/24/2013 2:30 PM Lavonia Drafts, MD [facility] [phone]       Medication List    TAKE these medications       ALPRAZolam 1 MG tablet  Commonly known as:  XANAX  Take 1 tablet (1 mg total) by mouth at bedtime as needed for anxiety.     calcium carbonate 500 MG chewable tablet  Commonly known as:  TUMS - dosed in mg elemental calcium  Chew 1 tablet by mouth daily.     ciprofloxacin 500 MG tablet  Commonly known as:  CIPRO  Take 1 tablet (500 mg total) by mouth 2 (two) times daily.     fluconazole 150 MG tablet  Commonly known as:  DIFLUCAN  Take 1 tablet (150 mg total) by mouth once.     ibuprofen 800 MG tablet  Commonly known as:  ADVIL,MOTRIN  Take 1 tablet (800 mg total) by mouth every 8 (eight) hours as needed.     ibuprofen 600 MG tablet  Commonly known as:  ADVIL,MOTRIN   Take 1 tablet (600 mg total) by mouth every 6 (six) hours as needed.     ipratropium 17 MCG/ACT inhaler  Commonly known as:  ATROVENT HFA  Inhale 1 puff into the lungs as needed for wheezing (SOB).     metroNIDAZOLE 500 MG tablet  Commonly known as:  FLAGYL  Take 1 tablet (500 mg total) by mouth 2 (two) times daily.     polyethylene glycol packet  Commonly known as:  MIRALAX / GLYCOLAX  Take 17 g by mouth daily.      ASK your doctor about these medications       budesonide-formoterol 160-4.5 MCG/ACT inhaler  Commonly known as:  SYMBICORT  Inhale 1 puff into the lungs 2 (two) times daily.     oxyCODONE-acetaminophen 5-325 MG per tablet  Commonly known as:  PERCOCET/ROXICET  Take 1-2 tablets by mouth every 6 (six) hours as needed.  Ask about: Which instructions should I use?     oxyCODONE-acetaminophen 5-325 MG per tablet  Commonly known as:  PERCOCET/ROXICET  Take 1-2 tablets by mouth every 6 (six) hours as needed.  Ask about: Which instructions should I use?           Follow-up Information   Follow up with Lavonia Drafts, MD On 06/10/2013. (as previously scheduled)    Specialty:  Obstetrics and Gynecology   Contact information:   [place] [address] [phone]       Signed: Emeterio Reeve 06/06/2013, 7:43  AM
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
## 2013-06-06 NOTE — Progress Notes (Signed)
06/06/13. Ledarius Leeson RNC-MNN, BSN, [phone], CM met with pt to folllow up.  Pt states she will have no trouble getting prescriptions filled as her father will be paying for what she needs.  Pt also has an appt on Monday with a new PCP to establish medical care.  All questions answered and pt's nurse aware.

## 2013-06-10 ENCOUNTER — Ambulatory Visit: Payer: No Typology Code available for payment source | Admitting: Obstetrics & Gynecology

## 2013-06-10 LAB — CULTURE, BLOOD (ROUTINE X 2)
Culture: NO GROWTH
Culture: NO GROWTH

## 2013-06-11 ENCOUNTER — Telehealth: Payer: Self-pay

## 2013-06-11 NOTE — Telephone Encounter (Signed)
Called pt. Per request of Dr. Maylene RoesTamala Julian to see how pt. Is recovering and feeling. Pt. States "I'm doing well. I hate that I missed my appointment yesterday but got my dates and times mixed up. I'm doing good. Still a little tender at the site but nothing too bad." Informed pt. To call us if any problems arise but that we will otherwise see her in March. Pt. Verbalized understanding and appreciation for the call. No other questions or concerns.

## 2013-06-19 ENCOUNTER — Encounter: Payer: Self-pay | Admitting: Obstetrics & Gynecology

## 2013-06-24 ENCOUNTER — Ambulatory Visit (INDEPENDENT_AMBULATORY_CARE_PROVIDER_SITE_OTHER): Payer: No Typology Code available for payment source | Admitting: Obstetrics & Gynecology

## 2013-06-24 ENCOUNTER — Encounter: Payer: Self-pay | Admitting: Obstetrics & Gynecology

## 2013-06-24 VITALS — BP 127/76 | HR 74 | Temp 97.8°F | Ht 65.0 in | Wt 192.1 lb

## 2013-06-24 DIAGNOSIS — N731 Chronic parametritis and pelvic cellulitis: Secondary | ICD-10-CM

## 2013-06-24 DIAGNOSIS — Z09 Encounter for follow-up examination after completed treatment for conditions other than malignant neoplasm: Secondary | ICD-10-CM

## 2013-06-24 DIAGNOSIS — N739 Female pelvic inflammatory disease, unspecified: Secondary | ICD-10-CM

## 2013-06-24 NOTE — Patient Instructions (Signed)
Total Laparoscopic Hysterectomy, Care After  Refer to this sheet in the next few weeks. These instructions provide you with information on caring for yourself after your procedure. Your health care provider may also give you more specific instructions. Your treatment has been planned according to current medical practices, but problems sometimes occur. Call your health care provider if you have any problems or questions after your procedure.  WHAT TO EXPECT AFTER THE PROCEDURE  · Pain and bruising at the incision sites. You will be given pain medicine to control it.  · Menopausal symptoms such as hot flashes, night sweats, and insomnia if your ovaries were removed.  · Sore throat from the breathing tube that was inserted during surgery.  HOME CARE INSTRUCTIONS  · Only take over-the-counter or prescription medicines for pain, discomfort, or fever as directed by your health care provider.    · Do not take aspirin. It can cause bleeding.    · Do not drive when taking pain medicine.    · Follow your health care provider's advice regarding diet, exercise, lifting, driving, and general activities.    · Resume your usual diet as directed and allowed.    · Get plenty of rest and sleep.    · Do not douche, use tampons, or have sexual intercourse for at least 6 weeks, or until your health care provider gives you permission.    · Change your bandages (dressings) as directed by your health care provider.    · Monitor your temperature and notify your health care provider of a fever.    · Take showers instead of baths for 2 3 weeks.    · Do not drink alcohol until your health care provider gives you permission.    · If you develop constipation, you may take a mild laxative with your health care provider's permission. Bran foods may help with constipation problems. Drinking enough fluids to keep your urine clear or pale yellow may help as well.    · Try to have someone home with you for 1 2 weeks to help around the house.     · Keep all of your follow-up appointments as directed by your health care provider.    SEEK MEDICAL CARE IF:  · You have swelling, redness, or increasing pain around your incision sites.    · You have pus coming from your incision.    · You notice a bad smell coming from your incision.    · Your incision breaks open.    · You feel dizzy or lightheaded.    · You have pain or bleeding when you urinate.    · You have persistent diarrhea.    · You have persistent nausea and vomiting.    · You have abnormal vaginal discharge.    · You have a rash.    · You have any type of abnormal reaction or develop an allergy to your medicine.    · You have poor pain control with your prescribed medicine.    SEEK IMMEDIATE MEDICAL CARE IF:  · You have chest pain or shortness of breath.  · You have severe abdominal pain that is not relieved with pain medicine.  · You have pain or swelling in your legs.  Document Released: 01/23/2013 Document Reviewed: 10/23/2012  ExitCare® Patient Information ©2014 Exit[employer], LLC.

## 2013-06-24 NOTE — Progress Notes (Signed)
Subjective:     Patient ID: Jessica [name], female   DOB: January 14, 1966, 48 y.o.   MRN: [id number]  HPI Pt presents for post op f/u.  She is s/o RATH with bilateral salpingectomy complicated with readmit for small pelvic abscess.  Pt reports that she has completed all of her meds/atbx and she only has limited pain.  She is tolerating regular diet.    Review of Systems     Objective:   Physical Exam BP 127/76  Pulse 74  Temp(Src) 97.8 F (36.6 C) (Oral)  Ht 5\' 5"  (1.651 m)  Wt 192 lb 1.6 oz (87.136 kg)  BMI 31.97 kg/m2  LMP 03/18/2013 Pt in NAD Abd: soft, NT, ND.  Port sites clean, dry, and intact     Assessment:     Post op check pt doing well. Clinically improved since hosp readmit     Plan:     F/u pelvic abscess 4 weeks for check of vaginal cuff F/u sono to eval location of abscess

## 2013-06-24 NOTE — Progress Notes (Signed)
Pain is at a constant 5. Has a stitch that may be infected.

## 2013-06-25 ENCOUNTER — Encounter: Payer: Self-pay | Admitting: Obstetrics & Gynecology

## 2013-07-03 ENCOUNTER — Ambulatory Visit (HOSPITAL_COMMUNITY)
Admission: RE | Admit: 2013-07-03 | Discharge: 2013-07-03 | Disposition: A | Discharge status: Home / Self Care | Payer: No Typology Code available for payment source | Source: Ambulatory Visit | Attending: Obstetrics & Gynecology | Admitting: Obstetrics & Gynecology

## 2013-07-03 DIAGNOSIS — Z90710 Acquired absence of both cervix and uterus: Secondary | ICD-10-CM | POA: Insufficient documentation

## 2013-07-03 DIAGNOSIS — N739 Female pelvic inflammatory disease, unspecified: Secondary | ICD-10-CM

## 2013-07-03 DIAGNOSIS — K651 Peritoneal abscess: Secondary | ICD-10-CM | POA: Insufficient documentation

## 2013-07-03 DIAGNOSIS — N83209 Unspecified ovarian cyst, unspecified side: Secondary | ICD-10-CM | POA: Insufficient documentation

## 2013-07-25 ENCOUNTER — Encounter: Payer: Self-pay | Admitting: Obstetrics & Gynecology

## 2013-07-25 ENCOUNTER — Ambulatory Visit (INDEPENDENT_AMBULATORY_CARE_PROVIDER_SITE_OTHER): Payer: No Typology Code available for payment source | Admitting: Obstetrics & Gynecology

## 2013-07-25 VITALS — BP 109/78 | HR 74 | Temp 98.2°F | Ht 65.0 in | Wt 195.9 lb

## 2013-07-25 DIAGNOSIS — Z09 Encounter for follow-up examination after completed treatment for conditions other than malignant neoplasm: Secondary | ICD-10-CM

## 2013-07-25 DIAGNOSIS — Z9889 Other specified postprocedural states: Secondary | ICD-10-CM

## 2013-07-25 NOTE — Patient Instructions (Signed)
Total Laparoscopic Hysterectomy, Care After  Refer to this sheet in the next few weeks. These instructions provide you with information on caring for yourself after your procedure. Your health care provider may also give you more specific instructions. Your treatment has been planned according to current medical practices, but problems sometimes occur. Call your health care provider if you have any problems or questions after your procedure.  WHAT TO EXPECT AFTER THE PROCEDURE  · Pain and bruising at the incision sites. You will be given pain medicine to control it.  · Menopausal symptoms such as hot flashes, night sweats, and insomnia if your ovaries were removed.  · Sore throat from the breathing tube that was inserted during surgery.  HOME CARE INSTRUCTIONS  · Only take over-the-counter or prescription medicines for pain, discomfort, or fever as directed by your health care provider.    · Do not take aspirin. It can cause bleeding.    · Do not drive when taking pain medicine.    · Follow your health care provider's advice regarding diet, exercise, lifting, driving, and general activities.    · Resume your usual diet as directed and allowed.    · Get plenty of rest and sleep.    · Do not douche, use tampons, or have sexual intercourse for at least 6 weeks, or until your health care provider gives you permission.    · Change your bandages (dressings) as directed by your health care provider.    · Monitor your temperature and notify your health care provider of a fever.    · Take showers instead of baths for 2 3 weeks.    · Do not drink alcohol until your health care provider gives you permission.    · If you develop constipation, you may take a mild laxative with your health care provider's permission. Bran foods may help with constipation problems. Drinking enough fluids to keep your urine clear or pale yellow may help as well.    · Try to have someone home with you for 1 2 weeks to help around the house.     · Keep all of your follow-up appointments as directed by your health care provider.    SEEK MEDICAL CARE IF:  · You have swelling, redness, or increasing pain around your incision sites.    · You have pus coming from your incision.    · You notice a bad smell coming from your incision.    · Your incision breaks open.    · You feel dizzy or lightheaded.    · You have pain or bleeding when you urinate.    · You have persistent diarrhea.    · You have persistent nausea and vomiting.    · You have abnormal vaginal discharge.    · You have a rash.    · You have any type of abnormal reaction or develop an allergy to your medicine.    · You have poor pain control with your prescribed medicine.    SEEK IMMEDIATE MEDICAL CARE IF:  · You have chest pain or shortness of breath.  · You have severe abdominal pain that is not relieved with pain medicine.  · You have pain or swelling in your legs.  Document Released: 01/23/2013 Document Reviewed: 10/23/2012  ExitCare® Patient Information ©2014 Exit[employer], LLC.

## 2013-07-25 NOTE — Progress Notes (Signed)
Subjective:     Patient ID: Jessica [name], female   DOB: 02/20/1966, 48 y.o.   MRN: [id number]  HPI Pt presents for 6-8 week post op check. She denies problems.  She has returned to work and has had no difficulty.  She denies problems voiding or passing stools.  She is off all pain meds.    Review of Systems     Objective:   Physical Exam BP 109/78  Pulse 74  Temp(Src) 98.2 F (36.8 C)  Ht 5\' 5"  (1.651 m)  Wt 195 lb 14.4 oz (88.86 kg)  BMI 32.60 kg/m2  LMP 03/18/2013 Pt in NAD Abd: soft, ND, NT, post sites all healing well GU: EGBUS: no lesions Vagina: no blood in vault; sutures noted at cuff- removed (soft, NOT attached); cuff healing well Adnexa: no masses; non tender   07/03/2013 EXAM:  TRANSVAGINAL ULTRASOUND OF PELVIS  TECHNIQUE:  Transvaginal ultrasound examination of the pelvis was performed  including evaluation of the uterus, ovaries, adnexal regions, and  pelvic cul-de-sac.  COMPARISON: CT on 06/04/2013  FINDINGS:  Uterus  Measurements: Surgically absent. Vaginal cuff is unremarkable in  appearance.  Endometrium  Thickness: Not applicable.  Right ovary  Measurements: 5.0 x 4.7 x 4.7 cm. A benign appearing cyst containing  blood clot is seen measuring 4.6 x 4.5 x 4.1 cm. This is new since  previous prior CT.  Left ovary  Measurements: 3.2 x 1.7 x 2.2 cm. Normal appearance/no adnexal mass.  Other findings: No free fluid  IMPRESSION:  New 4.6 cm right ovarian hemorrhagic cyst.  Normal appearance of the left ovary.  Prior hysterectomy. No other pelvic mass or fluid collection  visualized sonographically.         Assessment:     8 week Post op check- doing well      Plan:     Gradual return to full activity No intercourse for 2 weeks  F/u in 3 months or sooner prn

## 2013-08-20 ENCOUNTER — Telehealth: Payer: Self-pay | Admitting: *Deleted

## 2013-08-20 NOTE — Telephone Encounter (Signed)
Pt called nurse line concerned over spotting today and reports it happened 3 weeks ago.  Spoke with patient about dark brown spotting, informed patient that it is normal and reviewed sign/symptoms to cause concern.  Pt verbalizes understanding.

## 2013-09-17 ENCOUNTER — Encounter: Payer: Self-pay | Admitting: *Deleted

## 2013-11-20 ENCOUNTER — Other Ambulatory Visit: Payer: Self-pay | Admitting: Nurse Practitioner

## 2013-11-20 DIAGNOSIS — N63 Unspecified lump in unspecified breast: Secondary | ICD-10-CM

## 2013-11-21 ENCOUNTER — Other Ambulatory Visit (HOSPITAL_COMMUNITY): Payer: Self-pay | Admitting: *Deleted

## 2013-11-21 DIAGNOSIS — N6320 Unspecified lump in the left breast, unspecified quadrant: Secondary | ICD-10-CM

## 2013-11-25 ENCOUNTER — Other Ambulatory Visit: Payer: Self-pay

## 2013-11-26 ENCOUNTER — Ambulatory Visit (HOSPITAL_COMMUNITY)
Admission: RE | Admit: 2013-11-26 | Discharge: 2013-11-26 | Disposition: A | Discharge status: Home / Self Care | Payer: Medicaid Other | Source: Ambulatory Visit | Attending: Obstetrics and Gynecology | Admitting: Obstetrics and Gynecology

## 2013-11-26 ENCOUNTER — Encounter (HOSPITAL_COMMUNITY): Payer: Self-pay

## 2013-11-26 VITALS — BP 118/76 | Temp 98.7°F | Ht 65.0 in | Wt 196.4 lb

## 2013-11-26 DIAGNOSIS — Z1239 Encounter for other screening for malignant neoplasm of breast: Secondary | ICD-10-CM

## 2013-11-26 DIAGNOSIS — N6320 Unspecified lump in the left breast, unspecified quadrant: Secondary | ICD-10-CM

## 2013-11-26 DIAGNOSIS — N6325 Unspecified lump in the left breast, overlapping quadrants: Secondary | ICD-10-CM

## 2013-11-26 NOTE — Patient Instructions (Signed)
Explained to Jessica [name] that she did not need a Pap smear today due to last Pap smear was in January 2015 per patient. Let patient know that she does not need any further Pap smears due to her history of a hysterectomy for benign reasons. Referred patient to the [facility] for diagnostic mammogram and left breast ultrasound. Appointment scheduled for Wednesday, November 27, 2013 at 1430. Patient aware of appointment and will be there. Smoking cessation discussed with patient and gave her resources to [employer] and [employer] offered at [employer]. Jessica [name] verbalized understanding.  Dezmond Downie, [place], RN 2:32 PM

## 2013-11-26 NOTE — Progress Notes (Signed)
Complaints of left breast lump x 6-8 weeks. Patient stated has constant left breast pain that she rates at a 5 out of 10. Patient stated that she has noticed an increase of size of her left nipple.  Pap Smear:  Pap smear not completed today. Last Pap smear was January 2015 at [facility] and normal per patient. Per patient has a history of an abnormal Pap smear 20 years ago that required a repeat Pap smear for follow-up. Patient stated all Pap smears have been normal since repeat Pap smear. Patient has a history of a hysterectomy 05/24/2013 due to DUB. Patient no longer needs Pap smears due to her history of a hysterectomy for benign reasons per BCCCP and ACOG guidelines. Last Pap smear result is not in EPIC. Previous Pap smear on 10/07/2009 is in EPIC.  Physical exam: Breasts Breasts symmetrical. Left nipple is slightly larger than right nipple and per patient is a change for her. No skin abnormalities bilateral breasts. No nipple retraction bilateral breasts. No nipple discharge bilateral breasts. No lymphadenopathy. No lumps palpated right breast. Palpated a lump within the left breast at 9 o'clock 2 cm from the nipple. Patient complained of left outer breast tenderness and tenderness when palpated lump within the left breast. Patient also complained of some tenderness when palpated her right outer breast on exam. Referred patient to the [facility] for diagnostic mammogram and left breast ultrasound. Appointment scheduled for Wednesday, November 27, 2013 at 1430.    Pelvic/Bimanual No Pap smear completed today since last Pap smear was January 2015 and patient has a history of a hysterectomy for benign reasons.. Pap smear not indicated per BCCCP guidelines.   Smoking cessation discussed with patient and resources given. Patient directed to [employer] and the [employer] program offered at the [facility].

## 2013-11-27 ENCOUNTER — Ambulatory Visit
Admission: RE | Admit: 2013-11-27 | Discharge: 2013-11-27 | Disposition: A | Discharge status: Home / Self Care | Payer: Medicaid Other | Source: Ambulatory Visit | Attending: Nurse Practitioner | Admitting: Nurse Practitioner

## 2013-11-27 ENCOUNTER — Other Ambulatory Visit: Payer: Self-pay | Admitting: Nurse Practitioner

## 2013-11-27 ENCOUNTER — Encounter (INDEPENDENT_AMBULATORY_CARE_PROVIDER_SITE_OTHER): Payer: Self-pay

## 2013-11-27 DIAGNOSIS — N63 Unspecified lump in unspecified breast: Secondary | ICD-10-CM

## 2013-11-27 DIAGNOSIS — N6320 Unspecified lump in the left breast, unspecified quadrant: Secondary | ICD-10-CM

## 2013-11-28 ENCOUNTER — Other Ambulatory Visit: Payer: Self-pay | Admitting: Obstetrics and Gynecology

## 2013-11-28 DIAGNOSIS — N6320 Unspecified lump in the left breast, unspecified quadrant: Secondary | ICD-10-CM

## 2013-11-29 ENCOUNTER — Ambulatory Visit
Admission: RE | Admit: 2013-11-29 | Discharge: 2013-11-29 | Disposition: A | Discharge status: Home / Self Care | Payer: No Typology Code available for payment source | Source: Ambulatory Visit | Attending: Nurse Practitioner | Admitting: Nurse Practitioner

## 2013-11-29 ENCOUNTER — Ambulatory Visit
Admission: RE | Admit: 2013-11-29 | Discharge: 2013-11-29 | Disposition: A | Discharge status: Home / Self Care | Payer: No Typology Code available for payment source | Source: Ambulatory Visit | Attending: Obstetrics and Gynecology | Admitting: Obstetrics and Gynecology

## 2013-11-29 DIAGNOSIS — N6320 Unspecified lump in the left breast, unspecified quadrant: Secondary | ICD-10-CM

## 2013-12-02 ENCOUNTER — Other Ambulatory Visit: Payer: Self-pay | Admitting: Nurse Practitioner

## 2013-12-02 ENCOUNTER — Other Ambulatory Visit: Payer: Self-pay | Admitting: Obstetrics and Gynecology

## 2013-12-02 DIAGNOSIS — C50912 Malignant neoplasm of unspecified site of left female breast: Secondary | ICD-10-CM

## 2013-12-03 ENCOUNTER — Telehealth: Payer: Self-pay | Admitting: *Deleted

## 2013-12-03 ENCOUNTER — Other Ambulatory Visit: Payer: Self-pay

## 2013-12-03 DIAGNOSIS — C50412 Malignant neoplasm of upper-outer quadrant of left female breast: Secondary | ICD-10-CM

## 2013-12-03 DIAGNOSIS — Z170 Estrogen receptor positive status [ER+]: Secondary | ICD-10-CM | POA: Insufficient documentation

## 2013-12-03 NOTE — Telephone Encounter (Signed)
Confirmed [facility] for 12/11/13 at 12N .  Instructions and contact information given.

## 2013-12-08 ENCOUNTER — Ambulatory Visit
Admission: RE | Admit: 2013-12-08 | Discharge: 2013-12-08 | Disposition: A | Discharge status: Home / Self Care | Payer: Self-pay | Source: Ambulatory Visit | Attending: Nurse Practitioner | Admitting: Nurse Practitioner

## 2013-12-08 DIAGNOSIS — C50912 Malignant neoplasm of unspecified site of left female breast: Secondary | ICD-10-CM

## 2013-12-08 MED ORDER — GADOBENATE DIMEGLUMINE 529 MG/ML IV SOLN
19.0000 mL | Freq: Once | INTRAVENOUS | Status: AC | PRN
  Administered 2013-12-08: 19 mL via INTRAVENOUS

## 2013-12-10 ENCOUNTER — Telehealth: Payer: Self-pay | Admitting: *Deleted

## 2013-12-10 DIAGNOSIS — C50412 Malignant neoplasm of upper-outer quadrant of left female breast: Secondary | ICD-10-CM

## 2013-12-10 DIAGNOSIS — N83209 Unspecified ovarian cyst, unspecified side: Secondary | ICD-10-CM

## 2013-12-10 DIAGNOSIS — N949 Unspecified condition associated with female genital organs and menstrual cycle: Secondary | ICD-10-CM

## 2013-12-10 MED ORDER — TRAMADOL HCL 50 MG PO TABS: 50.0000 mg | ORAL_TABLET | Freq: Four times a day (QID) | ORAL | Status: DC | PRN

## 2013-12-10 NOTE — Telephone Encounter (Addendum)
Pt left message stating that she was last seen in our office 4 months ago. She was told that she had a small cyst on her ovary. She is now having pain and thinks that she can feel the cyst and that it has gotten bigger because she can feel it from the outside where the pain is located. She wants to know if she needs ultrasound. She was also diagnosed with breast cancer last week and will be getting some new meds soon. I sent a message to Dr. Ihor Dow regarding pt's concern and will wait for response. I called pt and left a message on her personal voice mail that we are waiting for response from Dr. Ihor Dow regarding recommendations.  [phone]  Called pt back and informed her that Dr. Ihor Dow wants her to have [place] ASAP.  Pt states she is currently @ work, has dental appt today @ 1400 and appts @ [facility] all afternoon tomorrow. I scheduled [place] for 8/27 @ 1445.  Pt advised she will need to have a full bladder for the exam. Pt agreed to appt. She stated that she is taking ibuprofen and tylenol avery 4-6 hrs without good pain relief. I advised pt that I will send another message to Dr. Ihor Dow to see if there is any other pain medicine she may have.  Pt voiced understaning of all information given.  Athelstan pt and informed her that Rx for pain med has been called to her pharmacy. It will likely make her drowsy so she should not be at work or driving car, etc.  Pt voiced understanding.

## 2013-12-11 ENCOUNTER — Encounter: Payer: Self-pay | Admitting: Oncology

## 2013-12-11 ENCOUNTER — Encounter: Payer: Self-pay | Admitting: General Practice

## 2013-12-11 ENCOUNTER — Ambulatory Visit (HOSPITAL_BASED_OUTPATIENT_CLINIC_OR_DEPARTMENT_OTHER): Payer: Medicaid Other | Admitting: Oncology

## 2013-12-11 ENCOUNTER — Ambulatory Visit: Payer: Medicaid Other | Attending: General Surgery | Admitting: Physical Therapy

## 2013-12-11 ENCOUNTER — Ambulatory Visit
Admission: RE | Admit: 2013-12-11 | Discharge: 2013-12-11 | Disposition: A | Discharge status: Home / Self Care | Payer: Medicaid Other | Source: Ambulatory Visit | Attending: Radiation Oncology | Admitting: Radiation Oncology

## 2013-12-11 ENCOUNTER — Other Ambulatory Visit (INDEPENDENT_AMBULATORY_CARE_PROVIDER_SITE_OTHER): Payer: Self-pay | Admitting: General Surgery

## 2013-12-11 ENCOUNTER — Encounter (INDEPENDENT_AMBULATORY_CARE_PROVIDER_SITE_OTHER): Payer: Self-pay | Admitting: General Surgery

## 2013-12-11 ENCOUNTER — Other Ambulatory Visit (HOSPITAL_BASED_OUTPATIENT_CLINIC_OR_DEPARTMENT_OTHER): Payer: Medicaid Other

## 2013-12-11 ENCOUNTER — Ambulatory Visit: Payer: Medicaid Other

## 2013-12-11 ENCOUNTER — Telehealth: Payer: Self-pay | Admitting: Oncology

## 2013-12-11 ENCOUNTER — Ambulatory Visit (HOSPITAL_BASED_OUTPATIENT_CLINIC_OR_DEPARTMENT_OTHER): Payer: Medicaid Other | Admitting: General Surgery

## 2013-12-11 VITALS — BP 121/77 | HR 77 | Temp 98.4°F | Resp 18 | Ht 65.0 in | Wt 196.7 lb

## 2013-12-11 DIAGNOSIS — F17200 Nicotine dependence, unspecified, uncomplicated: Secondary | ICD-10-CM

## 2013-12-11 DIAGNOSIS — R293 Abnormal posture: Secondary | ICD-10-CM | POA: Diagnosis not present

## 2013-12-11 DIAGNOSIS — C50919 Malignant neoplasm of unspecified site of unspecified female breast: Secondary | ICD-10-CM | POA: Insufficient documentation

## 2013-12-11 DIAGNOSIS — C50412 Malignant neoplasm of upper-outer quadrant of left female breast: Secondary | ICD-10-CM

## 2013-12-11 DIAGNOSIS — C50419 Malignant neoplasm of upper-outer quadrant of unspecified female breast: Secondary | ICD-10-CM

## 2013-12-11 DIAGNOSIS — IMO0001 Reserved for inherently not codable concepts without codable children: Secondary | ICD-10-CM | POA: Diagnosis not present

## 2013-12-11 DIAGNOSIS — R928 Other abnormal and inconclusive findings on diagnostic imaging of breast: Secondary | ICD-10-CM

## 2013-12-11 LAB — CBC WITH DIFFERENTIAL/PLATELET
BASO%: 0.7 % (ref 0.0–2.0)
BASOS ABS: 0.1 10*3/uL (ref 0.0–0.1)
EOS%: 6.5 % (ref 0.0–7.0)
Eosinophils Absolute: 0.6 10*3/uL — ABNORMAL HIGH (ref 0.0–0.5)
HCT: 45.1 % (ref 34.8–46.6)
HEMOGLOBIN: 15.3 g/dL (ref 11.6–15.9)
LYMPH%: 19.8 % (ref 14.0–49.7)
MCH: 32.9 pg (ref 25.1–34.0)
MCHC: 34 g/dL (ref 31.5–36.0)
MCV: 96.9 fL (ref 79.5–101.0)
MONO#: 0.4 10*3/uL (ref 0.1–0.9)
MONO%: 4.4 % (ref 0.0–14.0)
NEUT#: 6.1 10*3/uL (ref 1.5–6.5)
NEUT%: 68.6 % (ref 38.4–76.8)
Platelets: 176 10*3/uL (ref 145–400)
RBC: 4.65 10*6/uL (ref 3.70–5.45)
RDW: 13.2 % (ref 11.2–14.5)
WBC: 8.9 10*3/uL (ref 3.9–10.3)
lymph#: 1.8 10*3/uL (ref 0.9–3.3)

## 2013-12-11 LAB — COMPREHENSIVE METABOLIC PANEL (CC13)
ALK PHOS: 61 U/L (ref 40–150)
ALT: 27 U/L (ref 0–55)
AST: 21 U/L (ref 5–34)
Albumin: 4.1 g/dL (ref 3.5–5.0)
Anion Gap: 9 mEq/L (ref 3–11)
BUN: 10.8 mg/dL (ref 7.0–26.0)
CO2: 23 mEq/L (ref 22–29)
CREATININE: 0.9 mg/dL (ref 0.6–1.1)
Calcium: 9.7 mg/dL (ref 8.4–10.4)
Chloride: 105 mEq/L (ref 98–109)
Glucose: 103 mg/dl (ref 70–140)
Potassium: 3.8 mEq/L (ref 3.5–5.1)
Sodium: 137 mEq/L (ref 136–145)
Total Bilirubin: 0.54 mg/dL (ref 0.20–1.20)
Total Protein: 7.1 g/dL (ref 6.4–8.3)

## 2013-12-11 NOTE — Progress Notes (Signed)
Radiation Oncology         ([phone]) [phone] ________________________________  Name: Jessica [name] MRN: [id number]  Date: 12/11/2013  DOB: 1965/11/22  LP:FXTKWIOXCarmell Austria, MD  [name], MD     REFERRING PHYSICIAN: [name], MD   DIAGNOSIS: The encounter diagnosis was Breast cancer of upper-outer quadrant of left female breast.   HISTORY OF PRESENT ILLNESS::Jessica [name] is a 48 y.o. female who is seen for an initial consultation visit. The patient is seen today in multidisciplinary breast clinic regarding her new diagnosis of left-sided breast cancer. The patient states that she felt a suspicious nodule within the left breast which prompted further workup. She was found to have a suspicious abnormality within the left breast on mammography and an ultrasound confirmed a lesion measuring 1.5 cm. This was present at the 2:00 position within the left breast within the upper outer quadrant. The patient proceeded to undergo a biopsy. 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 Receptor studies indicated that the tumor was estrogen receptor positive, progesterone receptor positive, and HER-2/neu positive. The Ki-67 staining was 46%.  The patient proceeded to undergo an MRI scan of the breasts bilaterally. This revealed an irregular left breast mass which was compatible with the biopsy-proven left breast carcinoma. Extending posteriorly from this was a 6 cm area of clumped enhancement. There was an additional suspicious irregular enhancing mass within the upper and her quadrant of the left breast. Further biopsies of these 2 areas have been recommended.   PREVIOUS RADIATION THERAPY: No   PAST MEDICAL HISTORY:  has a past medical history of Anxiety; Asthma; COPD (chronic obstructive pulmonary disease) (2011); Depression; and Hot flashes.     PAST SURGICAL HISTORY: Past Surgical History  Procedure Laterality Date  . Polyp removal     . Tonsillectomy     . Wisdom tooth extraction    . Robotic assisted total hysterectomy with bilateral salpingo oopherectomy Bilateral 05/24/2013    Procedure: ROBOTIC ASSISTED TOTAL HYSTERECTOMY WITH BILATERAL SALPINGECTOMY;  Surgeon: Lavonia Drafts, MD;  Location: [facility];  Service: Gynecology;  Laterality: Bilateral;  . Cystoscopy N/A 05/24/2013    Procedure: CYSTOSCOPY;  Surgeon: Lavonia Drafts, MD;  Location: [facility];  Service: Gynecology;  Laterality: N/A;  . Abdominal hysterectomy       FAMILY HISTORY: family history includes COPD in her father; Heart disease in her father and mother.   SOCIAL HISTORY:  reports that she has been smoking Cigarettes.  She has a 16 pack-year smoking history. She has never used smokeless tobacco. She reports that she drinks alcohol. She reports that she does not use illicit drugs.   ALLERGIES: Review of patient's allergies indicates no known allergies.   MEDICATIONS:  Current Outpatient Prescriptions  Medication Sig Dispense Refill  . budesonide-formoterol (SYMBICORT) 160-4.5 MCG/ACT inhaler Inhale 1 puff into the lungs 2 (two) times daily.       . calcium carbonate (TUMS - DOSED IN MG ELEMENTAL CALCIUM) 500 MG chewable tablet Chew 1 tablet by mouth daily.      Marland Kitchen gabapentin (NEURONTIN) 600 MG tablet Take 600 mg by mouth at bedtime as needed.      Marland Kitchen ibuprofen (ADVIL,MOTRIN) 800 MG tablet Take 1 tablet (800 mg total) by mouth every 8 (eight) hours as needed.  30 tablet  0  . ipratropium (ATROVENT HFA) 17 MCG/ACT inhaler Inhale 1 puff into the lungs as needed for wheezing (SOB).      Marland Kitchen traMADol (ULTRAM) 50 MG  tablet Take 1 tablet (50 mg total) by mouth every 6 (six) hours as needed.  30 tablet  0   No current facility-administered medications for this encounter.     REVIEW OF SYSTEMS:  A 15 point review of systems is documented in the electronic medical record. This was obtained by the nursing staff. However, I reviewed this with the patient to discuss  relevant findings and make appropriate changes.  Pertinent items are noted in HPI.    PHYSICAL EXAM:  vitals were not taken for this visit.  ECOG = 1  0 - Asymptomatic (Fully active, able to carry on all predisease activities without restriction)  1 - Symptomatic but completely ambulatory (Restricted in physically strenuous activity but ambulatory and able to carry out work of a light or sedentary nature. For example, light housework, office work)  2 - Symptomatic, <50% in bed during the day (Ambulatory and capable of all self care but unable to carry out any work activities. Up and about more than 50% of waking hours)  3 - Symptomatic, >50% in bed, but not bedbound (Capable of only limited self-care, confined to bed or chair 50% or more of waking hours)  4 - Bedbound (Completely disabled. Cannot carry on any self-care. Totally confined to bed or chair)  5 - Death   Eustace Pen MM, Creech RH, Tormey DC, et al. [phone]). "Toxicity and response criteria of the [facility]". [place] Oncol. 5 (6): 649-55  General: Well-developed, in no acute distress HEENT: Normocephalic, atraumatic; oral cavity clear Neck: Supple without any lymphadenopathy Cardiovascular: Regular rate and rhythm Respiratory: Clear to auscultation bilaterally Breasts:   Patient has a palpable abnormality within the upper outer quadrant of the left breast. No axillary adenopathy on this side.  GI: Soft, nontender, normal bowel sounds Extremities: No edema present Neuro: No focal deficits     LABORATORY DATA:  Lab Results  Component Value Date   WBC 8.9 12/11/2013   HGB 15.3 12/11/2013   HCT 45.1 12/11/2013   MCV 96.9 12/11/2013   PLT 176 12/11/2013   Lab Results  Component Value Date   NA 137 12/11/2013   K 3.8 12/11/2013   CL 103 06/04/2013   CO2 23 12/11/2013   Lab Results  Component Value Date   ALT 27 12/11/2013   AST 21 12/11/2013   ALKPHOS 61 12/11/2013   BILITOT 0.54 12/11/2013       RADIOGRAPHY: Mr Breast Bilateral W Wo Contrast  12/09/2013   CLINICAL DATA:  Patient with recent diagnosis left breast invasive ductal carcinoma.  EXAM: BILATERAL BREAST MRI WITH AND WITHOUT CONTRAST  TECHNIQUE: Multiplanar, multisequence MR images of both breasts were obtained prior to and following the intravenous administration of 105m of MultiHance.  THREE-DIMENSIONAL MR IMAGE RENDERING ON INDEPENDENT WORKSTATION:  Three-dimensional MR images were rendered by post-processing of the original MR data on an independent workstation. The three-dimensional MR images were interpreted, and findings are reported in the following complete MRI report for this study. Three dimensional images were evaluated at the independent DynaCad workstation  COMPARISON:  Previous exams  FINDINGS: Breast composition: d.  Extreme fibroglandular tissue.  Background parenchymal enhancement: Marked  Right breast: Marked background parenchymal enhancement.  Left breast: Within the upper-outer left breast middle depth there is a 2.9 x 2.0 x 2.7 cm irregular enhancing mass with central susceptibility artifact compatible with biopsy marking clip. Mas is compatible with recently biopsied invasive ductal carcinoma.  Extending from the posterior margin of  this mass approximately 6 cm in a linear distribution is clumped non mass enhancement with multiple associated small nodules. The more posterior grouping of nodules measures 2.2 x 1.9 x 2.0 cm.  Additionally within the upper inner aspect of the left breast there is a 6 x 8 x 8 mm irregular spiculated enhancing mass.  Within the lower outer left breast anterior depth there is a 4 x 8 x 5 mm oval enhancing mass.  There is a 1.5 cm simple cyst within the 11 o'clock position left breast.  Lymph nodes: No abnormal appearing lymph nodes.  Ancillary findings:  None.  IMPRESSION: Irregular left breast mass compatible with biopsy-proven left breast carcinoma.  Extending from the posterior margin is  approximately 6 cm of clumped non mass enhancement with a grouping of suspicious nodules within the posterior lateral left breast. MRI guided core needle biopsy of this group of nodules is recommended.  Suspicious irregular enhancing mass within the upper inner left breast. MRI guided core needle biopsy of this mass is recommended.  Slightly suspicious oval enhancing mass within the lateral left breast anterior depth. Recommend determining pathology results from the above recommended MRI guided core needle biopsies and correlate with surgical planning. If necessary for further surgical planning, MRI guided core needle biopsy of this mass can be obtained.  RECOMMENDATION: MRI guided core needle biopsy suspicious group of enhancing nodules within the lower outer left breast posterior depth.  MRI guided core needle biopsy suspicious irregular enhancing mass within the upper inner left breast.  Correlate with pathology results and determine treatment plan/ surgical therapy. If needed for definitive treatment plan, an additional MRI guided core needle biopsy can be obtained of the oval enhancing mass within the lateral left breast.  BI-RADS CATEGORY  4: Suspicious.   Electronically Signed   By: Lovey Newcomer M.D.   On: 12/09/2013 12:46   Mm Digital Diagnostic Bilat  11/27/2013   CLINICAL DATA:  Questioned palpable finding per patient left upper outer quadrant  EXAM: DIGITAL DIAGNOSTIC  by MAMMOGRAM WITH CAD  ULTRASOUND left BREAST  COMPARISON:  Most recent similar comparison exam 10/21/2009  ACR Breast Density Category d: The breast tissue is extremely dense, which lowers the sensitivity of mammography.  FINDINGS: There is a suggestion of possible distortion in the left upper quadrant subjacent to the BB indicating the site of the patient's questioned palpable finding. The breast parenchyma in this region is extremely dense and no definable mass is identified. No abnormality is identified elsewhere in either breast.   Mammographic images were processed with CAD.  On physical exam, I palpate a firm nodular mass in the left breast 2 o'clock location at the site of the patient's questioned palpable finding.  Ultrasound is performed, showing an area of irregular hypoechoic masslike shadowing with suggestion of peripheral spiculations in the left breast 2 o'clock location 4 cm from the nipple measuring 1.5 x 1.5 x 1.0 cm. This corresponds to the area of mammographic distortion and the palpable mass. No left axillary lymphadenopathy.  IMPRESSION: Suspicious left breast distortion/ masslike abnormality at ultrasound in the left breast 2 o'clock location at the site of the patient's questioned palpable finding. Ultrasound-guided core biopsy is recommended and will be scheduled at the patient's convenience.  No evidence for malignancy in the right breast.  RECOMMENDATION: Left ultrasound-guided core biopsy  I have discussed the findings and recommendations with the patient. Results were also provided in writing at the conclusion of the visit. If applicable, a reminder  letter will be sent to the patient regarding the next appointment.  BI-RADS CATEGORY  4: Suspicious.   Electronically Signed   By: Conchita Paris M.D.   On: 11/27/2013 15:44   Mm Digital Diagnostic Unilat L  11/29/2013   CLINICAL DATA:  Status post ultrasound-guided core biopsy of a left breast mass.  EXAM: DIAGNOSTIC LEFT MAMMOGRAM POST ULTRASOUND BIOPSY  COMPARISON:  Previous exams  FINDINGS: Mammographic images were obtained following ultrasound guided biopsy of a mass in the 2 o'clock region left breast. Mammographic images show there is a ribbon shaped clip in the upper-outer quadrant of the left breast associated with distortion.  IMPRESSION: Status post ultrasound-guided core biopsy of the left breast with pathology pending.  Final Assessment: Post Procedure Mammograms for Marker Placement   Electronically Signed   By: Lillia Mountain M.D.   On: 11/29/2013 11:19    US Breast Ltd Uni Left Inc Axilla  11/27/2013   CLINICAL DATA:  Questioned palpable finding per patient left upper outer quadrant  EXAM: DIGITAL DIAGNOSTIC  by MAMMOGRAM WITH CAD  ULTRASOUND left BREAST  COMPARISON:  Most recent similar comparison exam 10/21/2009  ACR Breast Density Category d: The breast tissue is extremely dense, which lowers the sensitivity of mammography.  FINDINGS: There is a suggestion of possible distortion in the left upper quadrant subjacent to the BB indicating the site of the patient's questioned palpable finding. The breast parenchyma in this region is extremely dense and no definable mass is identified. No abnormality is identified elsewhere in either breast.  Mammographic images were processed with CAD.  On physical exam, I palpate a firm nodular mass in the left breast 2 o'clock location at the site of the patient's questioned palpable finding.  Ultrasound is performed, showing an area of irregular hypoechoic masslike shadowing with suggestion of peripheral spiculations in the left breast 2 o'clock location 4 cm from the nipple measuring 1.5 x 1.5 x 1.0 cm. This corresponds to the area of mammographic distortion and the palpable mass. No left axillary lymphadenopathy.  IMPRESSION: Suspicious left breast distortion/ masslike abnormality at ultrasound in the left breast 2 o'clock location at the site of the patient's questioned palpable finding. Ultrasound-guided core biopsy is recommended and will be scheduled at the patient's convenience.  No evidence for malignancy in the right breast.  RECOMMENDATION: Left ultrasound-guided core biopsy  I have discussed the findings and recommendations with the patient. Results were also provided in writing at the conclusion of the visit. If applicable, a reminder letter will be sent to the patient regarding the next appointment.  BI-RADS CATEGORY  4: Suspicious.   Electronically Signed   By: Conchita Paris M.D.   On: 11/27/2013 15:44   [place]  Lt Breast Bx W Loc Dev 1st Lesion Img Bx Spec US Guide  12/02/2013   ADDENDUM REPORT: 12/02/2013 12:10  ADDENDUM: Pathology revealed grade 1 invasive ductal carcinoma in the left breast. This was found to be concordant by Dr. Lillia Mountain. Pathology was discussed with the patient by telephone. She reported doing well after the biopsy with minimal bruising at the site. Post biopsy instructions were reviewed and her questions were answered. She is scheduled for [facility] on December 11, 2013, and for a bilateral breast MRI on December 08, 2013. She was encouraged to come to [facility] for educational materials. My number was provided for future questions and concerns.  Pathology results reported by Susa Raring RN, BSN  on December 02, 2013.   Electronically Signed   By: Lillia Mountain M.D.   On: 12/02/2013 12:10   12/02/2013   CLINICAL DATA:  Suspicious left breast mass.  EXAM: ULTRASOUND GUIDED LEFT BREAST CORE NEEDLE BIOPSY  COMPARISON:  With priors.  PROCEDURE: I met with the patient and we discussed the procedure of ultrasound-guided biopsy, including benefits and alternatives. We discussed the high likelihood of a successful procedure. We discussed the risks of the procedure including infection, bleeding, tissue injury, clip migration, and inadequate sampling. Informed written consent was given. The usual time-out protocol was performed immediately prior to the procedure.  Using sterile technique and 2% Lidocaine as local anesthetic, under direct ultrasound visualization, a 12 gauge vacuum-assisteddevice was used to perform biopsy of a mass in the 2 o'clock region of the left breast using a lateral approach. At the conclusion of the procedure, a ribbon shaped tissue marker clip was deployed into the biopsy cavity. Follow-up 2-view mammogram was performed and dictated separately.  IMPRESSION: Ultrasound-guided biopsy of the left breast. No apparent  complications.  Electronically Signed: By: Lillia Mountain M.D. On: 11/29/2013 11:16       IMPRESSION: The patient has a new diagnosis of invasive ductal carcinoma of the left breast, T2, N0, M0. She does have additional biopsies pending within the left breast. These will help determine the optimal and surgical recommendation. She has discussed this with Dr. Donne Hazel today and she also has seen medical oncology regarding the recommendation for chemotherapy.  I discussed with the patient the possibility of radiation treatment for her case. We discussed that a lumpectomy would be followed by radiation treatment at the appropriate time if she is deemed to be a good candidate for this. It is unclear that this will be a reasonable option for her pending further. I discussed with her the rationale of such a possible treatment including local/regional control. Also discussed the possible side effects and risks of treatment. I also discuss with her the possibility of radiation after mastectomy. Based on her information thus far, she has not have a clinical indication for postmastectomy radiation currently. She is aware that this could change after final results from surgery.  I also discussed with the patient the interaction of radiation treatment and reconstruction options.   PLAN: The patient will undergo further workup with respect to the additional 2 areas of concern noted on MRI scan.     If the patient proceeds with a lumpectomy or the patient has findings after mastectomy which suggests possible benefit of postmastectomy radiation treatment,  I would see the patient postoperatively to discuss her results and to further discuss radiation treatment. [name] upcoming care will be heavily dependent on her upcoming biopsy results.     ________________________________   Jodelle Gross, MD, PhD   **Disclaimer: This note was dictated with voice recognition software. Similar sounding words can  inadvertently be transcribed and this note may contain transcription errors which may not have been corrected upon publication of note.**

## 2013-12-11 NOTE — Progress Notes (Signed)
Checked in new pt with no financial concerns at this time. I informed pt of the [employer] fund and what they assist with. Informed pt of the different foundations that assist with copay assistance if needed. I gave her Raquel's card for any questions or concerns.

## 2013-12-11 NOTE — Progress Notes (Signed)
[place] with pt and SO of 11 years to introduce [place] and to review distress screen.  The patient scored a 10 on the Psychosocial Distress Thermometer which indicates severe distress. Provided pastoral presence/reflective listening, and assessed for distress and other psychosocial needs. Per pt, new dx is biggest source of stress, and distress has increased since taking the screen because she has learned that she will need more biopsies.  per pt, sleep was already a challenge, and is now significantly worse post biopsy.  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  Per pt, she has "lots of prayer support, and friends send me Scriptures, which really helps."  Faith and friendship are two main coping tools.   ONCBCN DISTRESS SCREENING 12/11/2013  Screening Type Initial Screening  [place] the number that describes how much distress you have been experiencing in the past week 10  Practical problem type Work/school  Family Problem type Partner  Emotional problem type Depression;Nervousness/Anxiety;Adjusting to illness;Feeling hopeless  Spiritual/Religous concerns type Facing my mortality  Information Concerns Type Lack of info about diagnosis;Lack of info about treatment  Physical Problem type Pain;Sleep/insomnia;Changes in urination;Tingling hands/feet;Swollen arms/legs  Referral to clinical social work Yes  Other [place] follow up needed: Yes.    If yes, follow up plan:  We plan for me to f/u by phone for check-in/further support.  Pt gave verbal permission to leave detailed message.  [place], [place]

## 2013-12-11 NOTE — Progress Notes (Signed)
Jessica [name]  [phone] [phone]     ID: Jessica [name] DOB: [birth year]  MR#: [id number]  XNA#:[id number]  Patient Care Team: [name], MD as PCP - General (Nurse Practitioner) [name], MD as Consulting Physician (General Surgery) [name], MD as Consulting Physician (Oncology) [name], MD as Consulting Physician (Radiation Oncology)  CHIEF COMPLAINT: Newly diagnosed triple positive breast cancer CURRENT TREATMENT: Awaiting definitive surgery  BREAST CANCER HISTORY: Jessica [name] herself palpated a mass in her left breast. She brought it to the attention of [name] at the health department and she was set up for unilateral left mammography and ultrasound 11/27/2013 at the [facility]. This showed a possible distortion in the left breast upper outer quadrant. However the patient's breasts are density category D. On exam there was a firm nodule or mass in the left breast 2:00 location which by ultrasound was irregular and hypoechoic and measured 1.5 cm. There was no left axillary lymphadenopathy noted.  Biopsy of the mass in question 11/29/2013 showed ([id number]) an invasive ductal carcinoma, grade 1, estrogen receptor 94% positive, progesterone receptor 94% positive, both with strong staining intensity, with an MIB-1 of 46%, and HER-2 amplified by CISH, the signals ratio of 4.85 and the number per cell 6.55.  : 12/08/2013 the patient underwent bilateral breast MRI. This showed in the left breast upper outer quadrant a 2.9 cm irregular enhancing mass and extending posteriorly from this an area of clumped non-masslike enhancement extending approximately 6 cm and leading to a posterior group of nodules which were small but measured in aggregate 2.2 cm. In addition, there was a separate 0.8 cm irregular spiculated enhancing mass in the upper inner aspect of the left breast. In the lower outer left breast there was a  0.8 cm oval enhancing mass. There were no abnormal appearing lymph nodes.  [name] subsequent history is as detailed below  INTERVAL HISTORY: Jessica [name] was evaluated in the multidisciplinary breast cancer clinic 12/11/2013 accompanied by her significant other Jessica. Her case was also discussed at the multidisciplinary breast cancer conference that same morning.  REVIEW OF SYSTEMS: Aside from the palpable mass itself, and there There were no worrisome symptoms leading to the definitive mammogram. The patient does have concerns regarding weight gain, insomnia, fatigue enough to affect her daily activities, pain in the shoulders and lower back, pain in the left lower quadrant where she is known to have an ovarian cyst, throbbing muscle aches, significant dental problems, irregular heartbeats, chest pain, shortness of breath when walking up stairs, sleeping on 2 pillows, severe heartburn, urinary stress incontinence, recent urinary tract infection, numbness in her fingers and toes, anxiety, depression, and severe hot flashes. A detailed review of systems today was otherwise entirely negative.    PAST MEDICAL HISTORY: Past Medical History  Diagnosis Date  . Anxiety   . Asthma   . COPD (chronic obstructive pulmonary disease) 2011    does not use inhaler ofter  . Depression   . Hot flashes     PAST SURGICAL HISTORY: Past Surgical History  Procedure Laterality Date  . Polyp removal     . Tonsillectomy    . Wisdom tooth extraction    . Robotic assisted total hysterectomy with bilateral salpingo oopherectomy Bilateral 05/24/2013    Procedure: ROBOTIC ASSISTED TOTAL HYSTERECTOMY WITH BILATERAL SALPINGECTOMY;  Surgeon: Lavonia Drafts, MD;  Location: [facility];  Service: Gynecology;  Laterality: Bilateral;  . Cystoscopy N/A 05/24/2013    Procedure: CYSTOSCOPY;  Surgeon: Lavonia Drafts, MD;  Location: [facility];  Service: Gynecology;  Laterality: N/A;  . Abdominal hysterectomy      FAMILY  HISTORY Family History  Problem Relation Age of Onset  . Heart disease Mother   . Heart disease Father   . COPD Father   [name] parents are still living, her father is 61 years old and her mother 57 years old as of August 2015. The patient had no brothers, one sister. There is no history of breast or ovarian cancer in the family.  GYNECOLOGIC HISTORY:  Patient's last menstrual period was 03/18/2013. Menarche age 54. She is GX P0. She took oral contraceptives for many years as well as Depo Provera shots. She is status post hysterectomy with bilateral salpingectomy. 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 She scans at work, and she also does all this she'll vein and stocking. She is single but for the last 11 years as lives with her significant other Jessica [name]. He is disabled secondary to multiple orthopedic problems including bilateral avascular necrosis of the hips. Both of them do smoke. The patient is here for drinks on weekends    ADVANCED DIRECTIVES: Not in place    HEALTH MAINTENANCE: History  Substance Use Topics  . Smoking status: Current Every [name] Smoker -- 1.00 packs/[name] for 16 years    Types: Cigarettes  . Smokeless tobacco: Never Used  . Alcohol Use: Yes     Comment: occasional      Colonoscopy:  PAP: January 2015   Bone density:  Lipid panel:  No Known Allergies  Current Outpatient Prescriptions  Medication Sig Dispense Refill  . budesonide-formoterol (SYMBICORT) 160-4.5 MCG/ACT inhaler Inhale 1 puff into the lungs 2 (two) times daily.       . calcium carbonate (TUMS - DOSED IN MG ELEMENTAL CALCIUM) 500 MG chewable tablet Chew 1 tablet by mouth daily.      Marland Kitchen gabapentin (NEURONTIN) 600 MG tablet Take 600 mg by mouth at bedtime as needed.      Marland Kitchen ibuprofen (ADVIL,MOTRIN) 800 MG tablet Take 1 tablet (800 mg total) by mouth every 8 (eight) hours as needed.  30 tablet  0  . ipratropium (ATROVENT HFA) 17 MCG/ACT  inhaler Inhale 1 puff into the lungs as needed for wheezing (SOB).      Marland Kitchen traMADol (ULTRAM) 50 MG tablet Take 1 tablet (50 mg total) by mouth every 6 (six) hours as needed.  30 tablet  0   No current facility-administered medications for this visit.    OBJECTIVE: middle-aged white woman in no acute distress  Filed Vitals:   12/11/13 1252  BP: 121/77  Pulse: 77  Temp: 98.4 F (36.9 C)  Resp: 18     Body mass index is 32.73 kg/(m^2).    ECOG FS:1 - Symptomatic but completely ambulatory  Ocular: Sclerae unicteric, pupils equal, round and reactive to light Ear-nose-throat: Oropharynx clear and moist  Lymphatic: No cervical or supraclavicular adenopathy Lungs no rales or rhonchi, good excursion bilaterally Heart regular rate and rhythm, no murmur appreciated Abd soft, nontender, positive bowel sounds MSK no focal spinal tenderness, no upper extremity lymphedema  Neuro: non-focal, well-oriented, appropriate affect Breasts: The right breast is unremarkable. The left breast status post recent biopsy. I do not palpate a well-defined mass. There is no skin or nipple change of concern. The left axilla is benign.    LAB RESULTS:  CMP  Component Value Date/Time   NA 137 12/11/2013 1221   NA 137 06/04/2013 1511   K 3.8 12/11/2013 1221   K 3.8 06/04/2013 1511   CL 103 06/04/2013 1511   CO2 23 12/11/2013 1221   CO2 22 06/04/2013 1511   GLUCOSE 103 12/11/2013 1221   GLUCOSE 114* 06/04/2013 1511   BUN 10.8 12/11/2013 1221   BUN 10 06/04/2013 1511   CREATININE 0.9 12/11/2013 1221   CREATININE 0.76 06/04/2013 1511   CALCIUM 9.7 12/11/2013 1221   CALCIUM 9.3 06/04/2013 1511   PROT 7.1 12/11/2013 1221   PROT 7.1 06/04/2013 1511   ALBUMIN 4.1 12/11/2013 1221   ALBUMIN 3.4* 06/04/2013 1511   AST 21 12/11/2013 1221   AST 12 06/04/2013 1511   ALT 27 12/11/2013 1221   ALT 14 06/04/2013 1511   ALKPHOS 61 12/11/2013 1221   ALKPHOS 70 06/04/2013 1511   BILITOT 0.54 12/11/2013 1221   BILITOT 0.5 06/04/2013 1511     GFRNONAA >90 06/04/2013 1511   GFRAA >90 06/04/2013 1511    I No results found for this basename: SPEP, UPEP,  kappa and lambda light chains    Lab Results  Component Value Date   WBC 8.9 12/11/2013   NEUTROABS 6.1 12/11/2013   HGB 15.3 12/11/2013   HCT 45.1 12/11/2013   MCV 96.9 12/11/2013   PLT 176 12/11/2013      Chemistry      Component Value Date/Time   NA 137 12/11/2013 1221   NA 137 06/04/2013 1511   K 3.8 12/11/2013 1221   K 3.8 06/04/2013 1511   CL 103 06/04/2013 1511   CO2 23 12/11/2013 1221   CO2 22 06/04/2013 1511   BUN 10.8 12/11/2013 1221   BUN 10 06/04/2013 1511   CREATININE 0.9 12/11/2013 1221   CREATININE 0.76 06/04/2013 1511      Component Value Date/Time   CALCIUM 9.7 12/11/2013 1221   CALCIUM 9.3 06/04/2013 1511   ALKPHOS 61 12/11/2013 1221   ALKPHOS 70 06/04/2013 1511   AST 21 12/11/2013 1221   AST 12 06/04/2013 1511   ALT 27 12/11/2013 1221   ALT 14 06/04/2013 1511   BILITOT 0.54 12/11/2013 1221   BILITOT 0.5 06/04/2013 1511       No results found for this basename: LABCA2    No components found with this basename: LABCA125    No results found for this basename: INR,  in the last 168 hours  Urinalysis    Component Value Date/Time   COLORURINE YELLOW 06/04/2013 1420   APPEARANCEUR HAZY* 06/04/2013 1420   LABSPEC >1.030* 06/04/2013 1420   PHURINE 6.0 06/04/2013 1420   GLUCOSEU NEGATIVE 06/04/2013 1420   HGBUR MODERATE* 06/04/2013 1420   HGBUR negative 10/07/2009 1044   BILIRUBINUR NEGATIVE 06/04/2013 1420   KETONESUR NEGATIVE 06/04/2013 1420   PROTEINUR NEGATIVE 06/04/2013 1420   UROBILINOGEN 0.2 06/04/2013 1420   NITRITE NEGATIVE 06/04/2013 1420   LEUKOCYTESUR NEGATIVE 06/04/2013 1420    STUDIES: Mr Breast Bilateral W Wo Contrast  12/09/2013   CLINICAL DATA:  Patient with recent diagnosis left breast invasive ductal carcinoma.  EXAM: BILATERAL BREAST MRI WITH AND WITHOUT CONTRAST  TECHNIQUE: Multiplanar, multisequence MR images of both breasts were obtained  prior to and following the intravenous administration of 73ml of MultiHance.  THREE-DIMENSIONAL MR IMAGE RENDERING ON INDEPENDENT WORKSTATION:  Three-dimensional MR images were rendered by post-processing of the original MR data on an independent workstation. The three-dimensional MR images were interpreted, and findings  are reported in the following complete MRI report for this study. Three dimensional images were evaluated at the independent DynaCad workstation  COMPARISON:  Previous exams  FINDINGS: Breast composition: d.  Extreme fibroglandular tissue.  Background parenchymal enhancement: Marked  Right breast: Marked background parenchymal enhancement.  Left breast: Within the upper-outer left breast middle depth there is a 2.9 x 2.0 x 2.7 cm irregular enhancing mass with central susceptibility artifact compatible with biopsy marking clip. Mas is compatible with recently biopsied invasive ductal carcinoma.  Extending from the posterior margin of this mass approximately 6 cm in a linear distribution is clumped non mass enhancement with multiple associated small nodules. The more posterior grouping of nodules measures 2.2 x 1.9 x 2.0 cm.  Additionally within the upper inner aspect of the left breast there is a 6 x 8 x 8 mm irregular spiculated enhancing mass.  Within the lower outer left breast anterior depth there is a 4 x 8 x 5 mm oval enhancing mass.  There is a 1.5 cm simple cyst within the 11 o'clock position left breast.  Lymph nodes: No abnormal appearing lymph nodes.  Ancillary findings:  None.  IMPRESSION: Irregular left breast mass compatible with biopsy-proven left breast carcinoma.  Extending from the posterior margin is approximately 6 cm of clumped non mass enhancement with a grouping of suspicious nodules within the posterior lateral left breast. MRI guided core needle biopsy of this group of nodules is recommended.  Suspicious irregular enhancing mass within the upper inner left breast. MRI guided  core needle biopsy of this mass is recommended.  Slightly suspicious oval enhancing mass within the lateral left breast anterior depth. Recommend determining pathology results from the above recommended MRI guided core needle biopsies and correlate with surgical planning. If necessary for further surgical planning, MRI guided core needle biopsy of this mass can be obtained.  RECOMMENDATION: MRI guided core needle biopsy suspicious group of enhancing nodules within the lower outer left breast posterior depth.  MRI guided core needle biopsy suspicious irregular enhancing mass within the upper inner left breast.  Correlate with pathology results and determine treatment plan/ surgical therapy. If needed for definitive treatment plan, an additional MRI guided core needle biopsy can be obtained of the oval enhancing mass within the lateral left breast.  BI-RADS CATEGORY  4: Suspicious.   Electronically Signed   By: Lovey Newcomer M.D.   On: 12/09/2013 12:46   Mm Digital Diagnostic Bilat  11/27/2013   CLINICAL DATA:  Questioned palpable finding per patient left upper outer quadrant  EXAM: DIGITAL DIAGNOSTIC  by MAMMOGRAM WITH CAD  ULTRASOUND left BREAST  COMPARISON:  Most recent similar comparison exam 10/21/2009  ACR Breast Density Category d: The breast tissue is extremely dense, which lowers the sensitivity of mammography.  FINDINGS: There is a suggestion of possible distortion in the left upper quadrant subjacent to the BB indicating the site of the patient's questioned palpable finding. The breast parenchyma in this region is extremely dense and no definable mass is identified. No abnormality is identified elsewhere in either breast.  Mammographic images were processed with CAD.  On physical exam, I palpate a firm nodular mass in the left breast 2 o'clock location at the site of the patient's questioned palpable finding.  Ultrasound is performed, showing an area of irregular hypoechoic masslike shadowing with  suggestion of peripheral spiculations in the left breast 2 o'clock location 4 cm from the nipple measuring 1.5 x 1.5 x 1.0 cm. This corresponds to  the area of mammographic distortion and the palpable mass. No left axillary lymphadenopathy.  IMPRESSION: Suspicious left breast distortion/ masslike abnormality at ultrasound in the left breast 2 o'clock location at the site of the patient's questioned palpable finding. Ultrasound-guided core biopsy is recommended and will be scheduled at the patient's convenience.  No evidence for malignancy in the right breast.  RECOMMENDATION: Left ultrasound-guided core biopsy  I have discussed the findings and recommendations with the patient. Results were also provided in writing at the conclusion of the visit. If applicable, a reminder letter will be sent to the patient regarding the next appointment.  BI-RADS CATEGORY  4: Suspicious.   Electronically Signed   By: Conchita Paris M.D.   On: 11/27/2013 15:44   Mm Digital Diagnostic Unilat L  11/29/2013   CLINICAL DATA:  Status post ultrasound-guided core biopsy of a left breast mass.  EXAM: DIAGNOSTIC LEFT MAMMOGRAM POST ULTRASOUND BIOPSY  COMPARISON:  Previous exams  FINDINGS: Mammographic images were obtained following ultrasound guided biopsy of a mass in the 2 o'clock region left breast. Mammographic images show there is a ribbon shaped clip in the upper-outer quadrant of the left breast associated with distortion.  IMPRESSION: Status post ultrasound-guided core biopsy of the left breast with pathology pending.  Final Assessment: Post Procedure Mammograms for Marker Placement   Electronically Signed   By: Lillia Mountain M.D.   On: 11/29/2013 11:19   US Breast Ltd Uni Left Inc Axilla  11/27/2013   CLINICAL DATA:  Questioned palpable finding per patient left upper outer quadrant  EXAM: DIGITAL DIAGNOSTIC  by MAMMOGRAM WITH CAD  ULTRASOUND left BREAST  COMPARISON:  Most recent similar comparison exam 10/21/2009  ACR Breast  Density Category d: The breast tissue is extremely dense, which lowers the sensitivity of mammography.  FINDINGS: There is a suggestion of possible distortion in the left upper quadrant subjacent to the BB indicating the site of the patient's questioned palpable finding. The breast parenchyma in this region is extremely dense and no definable mass is identified. No abnormality is identified elsewhere in either breast.  Mammographic images were processed with CAD.  On physical exam, I palpate a firm nodular mass in the left breast 2 o'clock location at the site of the patient's questioned palpable finding.  Ultrasound is performed, showing an area of irregular hypoechoic masslike shadowing with suggestion of peripheral spiculations in the left breast 2 o'clock location 4 cm from the nipple measuring 1.5 x 1.5 x 1.0 cm. This corresponds to the area of mammographic distortion and the palpable mass. No left axillary lymphadenopathy.  IMPRESSION: Suspicious left breast distortion/ masslike abnormality at ultrasound in the left breast 2 o'clock location at the site of the patient's questioned palpable finding. Ultrasound-guided core biopsy is recommended and will be scheduled at the patient's convenience.  No evidence for malignancy in the right breast.  RECOMMENDATION: Left ultrasound-guided core biopsy  I have discussed the findings and recommendations with the patient. Results were also provided in writing at the conclusion of the visit. If applicable, a reminder letter will be sent to the patient regarding the next appointment.  BI-RADS CATEGORY  4: Suspicious.   Electronically Signed   By: Conchita Paris M.D.   On: 11/27/2013 15:44   [place] Lt Breast Bx W Loc Dev 1st Lesion Img Bx Spec US Guide  12/02/2013   ADDENDUM REPORT: 12/02/2013 12:10  ADDENDUM: Pathology revealed grade 1 invasive ductal carcinoma in the left breast. This was found to be  concordant by Dr. Lillia Mountain. Pathology was discussed with the patient by  telephone. She reported doing well after the biopsy with minimal bruising at the site. Post biopsy instructions were reviewed and her questions were answered. She is scheduled for [facility] on December 11, 2013, and for a bilateral breast MRI on December 08, 2013. She was encouraged to come to [facility] for educational materials. My number was provided for future questions and concerns.  Pathology results reported by Susa Raring RN, BSN on December 02, 2013.   Electronically Signed   By: Lillia Mountain M.D.   On: 12/02/2013 12:10   12/02/2013   CLINICAL DATA:  Suspicious left breast mass.  EXAM: ULTRASOUND GUIDED LEFT BREAST CORE NEEDLE BIOPSY  COMPARISON:  With priors.  PROCEDURE: I met with the patient and we discussed the procedure of ultrasound-guided biopsy, including benefits and alternatives. We discussed the high likelihood of a successful procedure. We discussed the risks of the procedure including infection, bleeding, tissue injury, clip migration, and inadequate sampling. Informed written consent was given. The usual time-out protocol was performed immediately prior to the procedure.  Using sterile technique and 2% Lidocaine as local anesthetic, under direct ultrasound visualization, a 12 gauge vacuum-assisteddevice was used to perform biopsy of a mass in the 2 o'clock region of the left breast using a lateral approach. At the conclusion of the procedure, a ribbon shaped tissue marker clip was deployed into the biopsy cavity. Follow-up 2-view mammogram was performed and dictated separately.  IMPRESSION: Ultrasound-guided biopsy of the left breast. No apparent complications.  Electronically Signed: By: Lillia Mountain M.D. On: 11/29/2013 11:16    ASSESSMENT: 48 y.o. [place] woman status post left breast upper outer quadrant biopsy 11/29/2013 for a clinical T2 N0, stage IIA invasive ductal carcinoma, grade 1, triple positive, with an MIB-1 of  46%.  (1) MRI-guided biopsy of additional areas in the left breast and genetics testing are pending  PLAN: We spent the better part of today's hour-long appointment discussing the biology of breast cancer in general, and the specifics of the patient's tumor in particular. Elwyn understands whether or she receives chemotherapy before or after surgery will not affect her long-term survival. She would receive the same systemic treatment, which is going to be docetaxel and carboplatin together with trastuzumab and pertuzumab for 6 cycles given 3 weeks apart. She would then follow that with trastuzumab alone to complete one year. The actual decision whether she will we treated neoadjuvant [place] lip and on the pending biopsies which will also determine what type of surgery she will undergo.  She understands in any case she will need a port and an echocardiogram. She will benefit from chemotherapy school. We also discussed genetics and even though she already had bilateral salpingectomy she would have to have her ovaries removed if she proved to be BRCA positive.  In addition we discussed smoking cessation extensively. She understands oxygen is necessary for optimal benefit from chemotherapy and radiation and also that continuing smoking will compromise her surgical results and may indeed cause problems with wound healing. She and [name], who also smokes, have committed to discontinuing smoking before surgery and staying off cigarettes until the chemotherapy treatments have been completed.  The patient has a good understanding of the overall plan. She agrees with it. She knows the goal of treatment in her case is cure. She will call with any problems that may develop before her next visit  here.  [name], MD   12/11/2013 3:42 PM

## 2013-12-11 NOTE — Telephone Encounter (Signed)
cld & spoke to pt in re to appts-gave pt time & dates for appt-pt understood-will call to sch pt ECHO-no pre cert NO INS-will call pt w/appt time

## 2013-12-11 NOTE — Progress Notes (Signed)
Patient ID: Jessica [name], female   DOB: 16-Feb-1966, 48 y.o.   MRN: [id number]  Chief Complaint  Patient presents with  . Other    HPI Jessica [name] is a 48 y.o. female.  Referred by Dr Lovey Newcomer HPI 40 yof who noted a palpable left breast mass, she has breast density category d, about a month ago. No nipple discharge.  On ultrasound this mass was 1.5x1.5x1 cm mass. On mri this is 2.9x2x2.7 cm with 6 cm of NME posterior to this.  There is an additional 6x8x8 mm upper inner left breast mass.  She comes in today to discuss options.  Past Medical History  Diagnosis Date  . Anxiety   . Asthma   . COPD (chronic obstructive pulmonary disease) 2011    does not use inhaler ofter  . Depression   . Hot flashes     Past Surgical History  Procedure Laterality Date  . Polyp removal     . Tonsillectomy    . Wisdom tooth extraction    . Robotic assisted total hysterectomy with bilateral salpingo oopherectomy Bilateral 05/24/2013    Procedure: ROBOTIC ASSISTED TOTAL HYSTERECTOMY WITH BILATERAL SALPINGECTOMY;  Surgeon: Lavonia Drafts, MD;  Location: [facility];  Service: Gynecology;  Laterality: Bilateral;  . Cystoscopy N/A 05/24/2013    Procedure: CYSTOSCOPY;  Surgeon: Lavonia Drafts, MD;  Location: [facility];  Service: Gynecology;  Laterality: N/A;  . Abdominal hysterectomy      Family History  Problem Relation Age of Onset  . Heart disease Mother   . Heart disease Father   . COPD Father     Social History History  Substance Use Topics  . Smoking status: Current Every Day Smoker -- 1.00 packs/day for 16 years    Types: Cigarettes  . Smokeless tobacco: Never Used  . Alcohol Use: Yes     Comment: occasional     No Known Allergies  Current Outpatient Prescriptions  Medication Sig Dispense Refill  . budesonide-formoterol (SYMBICORT) 160-4.5 MCG/ACT inhaler Inhale 1 puff into the lungs 2 (two) times daily.       . calcium carbonate (TUMS - DOSED IN MG ELEMENTAL CALCIUM) 500  MG chewable tablet Chew 1 tablet by mouth daily.      Marland Kitchen gabapentin (NEURONTIN) 600 MG tablet Take 600 mg by mouth at bedtime as needed.      Marland Kitchen ibuprofen (ADVIL,MOTRIN) 800 MG tablet Take 1 tablet (800 mg total) by mouth every 8 (eight) hours as needed.  30 tablet  0  . ipratropium (ATROVENT HFA) 17 MCG/ACT inhaler Inhale 1 puff into the lungs as needed for wheezing (SOB).      Marland Kitchen traMADol (ULTRAM) 50 MG tablet Take 1 tablet (50 mg total) by mouth every 6 (six) hours as needed.  30 tablet  0   No current facility-administered medications for this visit.    Review of Systems Review of Systems  Constitutional: Positive for fatigue. Negative for fever, chills and unexpected weight change.  HENT: Negative for congestion, hearing loss, sore throat, trouble swallowing and voice change.   Eyes: Negative for visual disturbance.  Respiratory: Negative for cough and wheezing.   Cardiovascular: Negative for chest pain, palpitations and leg swelling.  Gastrointestinal: Negative for nausea, vomiting, abdominal pain, diarrhea, constipation, blood in stool, abdominal distention and anal bleeding.  Genitourinary: Negative for hematuria, vaginal bleeding and difficulty urinating.  Musculoskeletal: Positive for back pain. Negative for arthralgias.  Skin: Negative for rash and wound.  Neurological: Negative for seizures,  syncope and headaches.  Hematological: Negative for adenopathy. Does not bruise/bleed easily.  Psychiatric/Behavioral: Negative for confusion.    Last menstrual period 03/18/2013.  Physical Exam Physical Exam  Vitals reviewed. Constitutional: She appears well-developed and well-nourished.  Neck: Neck supple.  Cardiovascular: Normal rate, regular rhythm and normal heart sounds.   Pulmonary/Chest: Effort normal and breath sounds normal. She has no wheezes. She has no rales. Right breast exhibits no inverted nipple, no mass, no nipple discharge, no skin change and no tenderness. Left breast  exhibits mass. Left breast exhibits no inverted nipple, no nipple discharge, no skin change and no tenderness.    Lymphadenopathy:    She has no cervical adenopathy.    She has no axillary adenopathy.       Right: No supraclavicular adenopathy present.       Left: No supraclavicular adenopathy present.    Data Reviewed EXAM:  BILATERAL BREAST MRI WITH AND WITHOUT CONTRAST  TECHNIQUE:  Multiplanar, multisequence MR images of both breasts were obtained  prior to and following the intravenous administration of 22ml of  MultiHance.  THREE-DIMENSIONAL MR IMAGE RENDERING ON INDEPENDENT WORKSTATION:  Three-dimensional MR images were rendered by post-processing of the  original MR data on an independent workstation. The  three-dimensional MR images were interpreted, and findings are  reported in the following complete MRI report for this study. Three  dimensional images were evaluated at the independent DynaCad  workstation  COMPARISON: Previous exams  FINDINGS:  Breast composition: d. Extreme fibroglandular tissue.  Background parenchymal enhancement: Marked  Right breast: Marked background parenchymal enhancement.  Left breast: Within the upper-outer left breast middle depth there  is a 2.9 x 2.0 x 2.7 cm irregular enhancing mass with central  susceptibility artifact compatible with biopsy marking clip. 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  Extending from the posterior margin of this mass approximately 6 cm  in a linear distribution is clumped non mass enhancement with  multiple associated small nodules. The more posterior grouping of  nodules measures 2.2 x 1.9 x 2.0 cm.  Additionally within the upper inner aspect of the left breast there  is a 6 x 8 x 8 mm irregular spiculated enhancing mass.  Within the lower outer left breast anterior depth there is a 4 x 8 x  5 mm oval enhancing mass.  There is a 1.5 cm simple cyst within the 11 o'clock position  left  breast.  Lymph nodes: No abnormal appearing lymph nodes.  Ancillary findings: None.  IMPRESSION:  Irregular left breast mass compatible with biopsy-proven left breast  carcinoma.  Extending from the posterior margin is approximately 6 cm of clumped  non mass enhancement with a grouping of suspicious nodules within  the posterior lateral left breast. MRI guided core needle biopsy of  this group of nodules is recommended.  Suspicious irregular enhancing mass within the upper inner left  breast. MRI guided core needle biopsy of this mass is recommended.  Slightly suspicious oval enhancing mass within the lateral left  breast anterior depth. Recommend determining pathology results from  the above recommended MRI guided core needle biopsies and correlate  with surgical planning. If necessary for further surgical planning,  MRI guided core needle biopsy of this mass can be obtained.  RECOMMENDATION:  MRI guided core needle biopsy suspicious group of enhancing nodules  within the lower outer left breast posterior depth.  MRI guided core needle biopsy suspicious irregular enhancing mass  within the upper inner left  breast.    Assessment    Clinical stage II left breast cancer    Plan    Needs additional biopsies of the left breast inner quadrant lesion and posterior extent of disease I will see her back right after her biopsies to decide final care. She will also get genetic testing. We discussed port placement for chemo/herceptin. We also discussed that primary chemotherapy might be the best option but will make final decisions after biopsies.  We discussed the staging and pathophysiology of breast cancer. We discussed all of the different options for treatment for breast cancer including surgery, chemotherapy, radiation therapy, Herceptin, and antiestrogen therapy.   We discussed a sentinel lymph node biopsy as she does not appear to having lymph node involvement right now. We  discussed the performance of that with injection of radioactive tracer. We discussed that she would have an incision underneath her axillary hairline. We discussed that there is a chance of having a positive node with a sentinel lymph node biopsy and we will await the permanent pathology to make any other first further decisions in terms of her treatment. One of these options might be to return to the operating room to perform an axillary lymph node dissection. We discussed up to a 5% risk lifetime of chronic shoulder pain as well as lymphedema associated with a sentinel lymph node biopsy.  We discussed the options for treatment of the breast cancer which included lumpectomy versus a mastectomy. We discussed the performance of the lumpectomy with a radioactive seed placement. We discussed a chance of a positive margin requiring reexcision in the operating room. We also discussed that she may need radiation therapy or antiestrogen therapy or both if she undergoes lumpectomy. We discussed the mastectomy and the postoperative care for that as well. We discussed that there is no difference in her survival whether she undergoes lumpectomy  versus a mastectomy.  We discussed the risks of operation including bleeding, infection, possible reoperation. She understands her further therapy will be based on what her stages at the time of her operation.          Margarett Viti 12/11/2013, 3:33 PM

## 2013-12-12 ENCOUNTER — Ambulatory Visit (HOSPITAL_COMMUNITY)
Admission: RE | Admit: 2013-12-12 | Discharge: 2013-12-12 | Disposition: A | Discharge status: Home / Self Care | Payer: Medicaid Other | Source: Ambulatory Visit | Attending: Obstetrics & Gynecology | Admitting: Obstetrics & Gynecology

## 2013-12-12 ENCOUNTER — Telehealth: Payer: Self-pay | Admitting: Oncology

## 2013-12-12 DIAGNOSIS — N949 Unspecified condition associated with female genital organs and menstrual cycle: Secondary | ICD-10-CM | POA: Insufficient documentation

## 2013-12-12 DIAGNOSIS — Z90710 Acquired absence of both cervix and uterus: Secondary | ICD-10-CM | POA: Diagnosis not present

## 2013-12-12 DIAGNOSIS — C50919 Malignant neoplasm of unspecified site of unspecified female breast: Secondary | ICD-10-CM | POA: Insufficient documentation

## 2013-12-12 DIAGNOSIS — N83209 Unspecified ovarian cyst, unspecified side: Secondary | ICD-10-CM

## 2013-12-12 DIAGNOSIS — C50412 Malignant neoplasm of upper-outer quadrant of left female breast: Secondary | ICD-10-CM

## 2013-12-12 NOTE — Telephone Encounter (Signed)
cld & left pt message in re to ECHO sch time date location-no pre cert req no ins on file

## 2013-12-13 ENCOUNTER — Encounter: Payer: Self-pay | Admitting: Genetic Counselor

## 2013-12-13 ENCOUNTER — Other Ambulatory Visit: Payer: Medicaid Other

## 2013-12-13 ENCOUNTER — Ambulatory Visit (HOSPITAL_BASED_OUTPATIENT_CLINIC_OR_DEPARTMENT_OTHER): Payer: Medicaid Other | Admitting: Genetic Counselor

## 2013-12-13 DIAGNOSIS — IMO0002 Reserved for concepts with insufficient information to code with codable children: Secondary | ICD-10-CM

## 2013-12-13 DIAGNOSIS — C50919 Malignant neoplasm of unspecified site of unspecified female breast: Secondary | ICD-10-CM | POA: Insufficient documentation

## 2013-12-13 DIAGNOSIS — C50419 Malignant neoplasm of upper-outer quadrant of unspecified female breast: Secondary | ICD-10-CM

## 2013-12-13 NOTE — Progress Notes (Signed)
HISTORY OF PRESENT ILLNESS: Dr. Izola Price requested a cancer genetics consultation for Jessica [name], a 48 y.o. female, due to a personal history of breast cancer.  Jessica [name] presents to clinic today to discuss the possibility of a hereditary predisposition to cancer, genetic testing, and to further clarify her future cancer risks, as well as potential cancer risk for family members. Jessica [name] was recently diagnosed with left breast cancer, IDC (ER+/PR+/Her2Neu+) at the age of 37. She has no history of other cancer.   Past Medical History  Diagnosis Date   Anxiety    Asthma    COPD (chronic obstructive pulmonary disease) 2011    does not use inhaler ofter   Depression    Hot flashes     Past Surgical History  Procedure Laterality Date   Polyp removal      Tonsillectomy     Wisdom tooth extraction     Robotic assisted total hysterectomy with bilateral salpingo oopherectomy Bilateral 05/24/2013    Procedure: ROBOTIC ASSISTED TOTAL HYSTERECTOMY WITH BILATERAL SALPINGECTOMY;  Surgeon: Lavonia Drafts, MD;  Location: [facility];  Service: Gynecology;  Laterality: Bilateral;   Cystoscopy N/A 05/24/2013    Procedure: CYSTOSCOPY;  Surgeon: Lavonia Drafts, MD;  Location: [facility];  Service: Gynecology;  Laterality: N/A;   Abdominal hysterectomy      History   Social History   Marital Status: Single    Spouse Name: N/A    Number of Children: N/A   Years of Education: N/A   Social History Main Topics   Smoking status: Current Every Day Smoker -- 1.00 packs/day for 16 years    Types: Cigarettes   Smokeless tobacco: Never Used   Alcohol Use: Yes     Comment: occasional    Drug Use: No   Sexual Activity: Not Currently    Birth Control/ Protection: Surgical   Other Topics Concern   Not on file   Social History Narrative   No narrative on file     FAMILY HISTORY:  During the visit, a 4-generation pedigree was obtained. Significant diagnoses include the  following:  Family History  Problem Relation Age of Onset   Heart disease Mother    Heart disease Father    COPD Father    Cancer Neg Hx     Jessica [name] ancestry is of Caucasian descent. There is no known Jewish ancestry or consanguinity.  GENETIC COUNSELING ASSESSMENT: Jessica [name] is a 48 y.o. female with a personal history of cancer suggestive of a hereditary predisposition to cancer due to her young age of diagnosis. We, therefore, discussed and recommended the following at today's visit.   DISCUSSION: We reviewed the characteristics, features and inheritance patterns of hereditary cancer syndromes. We also discussed genetic testing, including the appropriate family members to test, the process of testing, insurance coverage and turn-around-time for results. We discussed the implications of a negative, positive and/or variant of uncertain significant result. We recommended Jessica [name] pursue genetic testing for the BRCA1 and BRCA2 genes.   PLAN: Based on our above recommendation, Jessica [name] to pursue genetic testing and the blood sample was drawn and will be sent to [facility] for analysis. Results should be available within approximately 2 weeks time, at which point they will be disclosed by telephone to Jessica [name], as will any additional recommendations warranted by these results. We also encouraged Jessica [name] to remain in contact with cancer genetics annually so that we can continuously update the family history and inform  her of any changes in cancer genetics and testing that may be of benefit for this family. Ms.  Jessica [name] questions were answered to her satisfaction today. Our contact information was provided should additional questions or concerns arise.   Thank you for the referral and allowing [place] to share in the care of your patient.   The patient was seen for a total of 40 minutes in face-to-face genetic counseling.  This patient was discussed with Dr.  Jana Hakim who agrees with the above.    _______________________________________________________________________ For Office Staff:  Number of people involved in session: 3 Was an Intern/ student involved with case: not applicable

## 2013-12-16 ENCOUNTER — Telehealth: Payer: Self-pay

## 2013-12-16 ENCOUNTER — Telehealth (INDEPENDENT_AMBULATORY_CARE_PROVIDER_SITE_OTHER): Payer: Self-pay

## 2013-12-16 ENCOUNTER — Other Ambulatory Visit (INDEPENDENT_AMBULATORY_CARE_PROVIDER_SITE_OTHER): Payer: Self-pay

## 2013-12-16 DIAGNOSIS — F411 Generalized anxiety disorder: Secondary | ICD-10-CM

## 2013-12-16 MED ORDER — DIAZEPAM 5 MG PO TABS: ORAL_TABLET | ORAL | Status: DC

## 2013-12-16 NOTE — Telephone Encounter (Signed)
Message copied by Geanie Logan on Mon Dec 16, 2013  9:07 AM ------      Message from: Lavonia Drafts      Created: Fri Dec 13, 2013  1:39 PM       Please call pt.  The cyst on her ovary has resolved.            Thx,      clh-S ------

## 2013-12-16 NOTE — Telephone Encounter (Signed)
Patient calling into office requesting a prescription for anxiety to take prior to her MRI tomorrow morning at 7:00am.  Patient very concerned and having a lot of anxiety for her scan.  Patient aware I will forward message to Dr. Donne Hazel and his assistant.

## 2013-12-16 NOTE — Telephone Encounter (Signed)
Called patient and informed her of results. Patient verbalized understanding. Patient reports she is still having left sided pain. Informed patient both left and right ovary are normal. Advised she follow up with PCP to evaluate other possibilities for cause of pain as GYN is now clear. Patient verbalized understanding and stated she would follow up at health department.

## 2013-12-16 NOTE — Telephone Encounter (Signed)
Called and spoke to patient to make aware that a prescription for anxiety for MRI at 7:00am Tuesday morning.  Patient will pick up prior to closing.  Advised patient I will wait on her to come to the office to pick rx up at the front desk.

## 2013-12-17 ENCOUNTER — Other Ambulatory Visit (INDEPENDENT_AMBULATORY_CARE_PROVIDER_SITE_OTHER): Payer: Self-pay

## 2013-12-17 ENCOUNTER — Telehealth: Payer: Self-pay | Admitting: *Deleted

## 2013-12-17 ENCOUNTER — Ambulatory Visit
Admission: RE | Admit: 2013-12-17 | Discharge: 2013-12-17 | Disposition: A | Discharge status: Home / Self Care | Payer: Medicaid Other | Source: Ambulatory Visit | Attending: General Surgery | Admitting: General Surgery

## 2013-12-17 ENCOUNTER — Other Ambulatory Visit: Payer: Medicaid Other

## 2013-12-17 ENCOUNTER — Ambulatory Visit
Admission: RE | Admit: 2013-12-17 | Discharge: 2013-12-17 | Disposition: A | Discharge status: Home / Self Care | Payer: Self-pay | Source: Ambulatory Visit | Attending: General Surgery | Admitting: General Surgery

## 2013-12-17 ENCOUNTER — Other Ambulatory Visit: Payer: Self-pay

## 2013-12-17 ENCOUNTER — Encounter: Payer: Self-pay | Admitting: *Deleted

## 2013-12-17 ENCOUNTER — Ambulatory Visit: Payer: Self-pay

## 2013-12-17 ENCOUNTER — Other Ambulatory Visit: Payer: Self-pay | Admitting: *Deleted

## 2013-12-17 DIAGNOSIS — R079 Chest pain, unspecified: Secondary | ICD-10-CM

## 2013-12-17 DIAGNOSIS — R928 Other abnormal and inconclusive findings on diagnostic imaging of breast: Secondary | ICD-10-CM

## 2013-12-17 DIAGNOSIS — C50919 Malignant neoplasm of unspecified site of unspecified female breast: Secondary | ICD-10-CM

## 2013-12-17 MED ORDER — GADOBENATE DIMEGLUMINE 529 MG/ML IV SOLN
18.0000 mL | Freq: Once | INTRAVENOUS | Status: AC | PRN
  Administered 2013-12-17: 18 mL via INTRAVENOUS

## 2013-12-17 MED ORDER — LORAZEPAM 0.5 MG PO TABS: 0.5000 mg | ORAL_TABLET | Freq: Two times a day (BID) | ORAL | Status: DC | PRN

## 2013-12-17 NOTE — Telephone Encounter (Signed)
Pt called c/o pain to bx site. Relate she has a large hematoma and had difficulty with bleeding d/t "vessel" being punctured. Pt crying and state she bled a lot in the office and went home and started to bleed again. She relayed she is wrapped very tight and in extreme amount of pain. She relayed she is having difficulty breathing b/c she is in so much pain. Try to calm pt. Called Dr. Cristal Generous office to inform nursing triage. Was informed to have pt call triage nurse at [facility]. Called pt back and informed to call Dr. Cristal Generous office. Informed pt to apply ice packs to help with pain. Received verbal understanding. Gave pt Dr. Cristal Generous office number.

## 2013-12-17 NOTE — Progress Notes (Signed)
Pt called in with extreme anxiety. Pt relayed she has had no sleep and is having difficulty concentrating at work. Per Dr. Jana Hakim, called in Ativan 0.5mg  BID, PRN. Called in left vm with instructions.

## 2013-12-17 NOTE — Telephone Encounter (Signed)
Left vm for pt to return call to discuss Manuel Garcia from 12/11/13.

## 2013-12-19 ENCOUNTER — Ambulatory Visit (HOSPITAL_COMMUNITY)
Admission: RE | Admit: 2013-12-19 | Discharge: 2013-12-19 | Disposition: A | Discharge status: Home / Self Care | Payer: Medicaid Other | Source: Ambulatory Visit | Attending: Oncology | Admitting: Oncology

## 2013-12-19 ENCOUNTER — Telehealth: Payer: Self-pay | Admitting: *Deleted

## 2013-12-19 DIAGNOSIS — C50919 Malignant neoplasm of unspecified site of unspecified female breast: Secondary | ICD-10-CM

## 2013-12-19 DIAGNOSIS — C50412 Malignant neoplasm of upper-outer quadrant of left female breast: Secondary | ICD-10-CM

## 2013-12-19 NOTE — Telephone Encounter (Signed)
Pt had echo test done this am.  Pt came to clinic as a walk-in wanting to notify nurse about hematoma on left breast post biopsy.  Spoke with pt. Nurse noted large bruised area above left breast, and no bleeding noted.   No swelling noted.  Pt stated she was instructed to tell nurse by echocardiogram technologist.  Instructed pt to call Dr. Renae Gloss office  at the [facility] ( who did biopsy ) and informed his nurse about the hematoma.  Pt voiced understanding, and stated she would contact Dr. Marisue Brooklyn office.

## 2013-12-25 ENCOUNTER — Other Ambulatory Visit (INDEPENDENT_AMBULATORY_CARE_PROVIDER_SITE_OTHER): Payer: Self-pay | Admitting: General Surgery

## 2013-12-25 ENCOUNTER — Other Ambulatory Visit: Payer: Self-pay | Admitting: Oncology

## 2013-12-25 ENCOUNTER — Telehealth: Payer: Self-pay | Admitting: Oncology

## 2013-12-25 NOTE — Telephone Encounter (Signed)
, °

## 2013-12-26 ENCOUNTER — Telehealth: Payer: Self-pay | Admitting: Oncology

## 2013-12-26 ENCOUNTER — Encounter: Payer: Self-pay | Admitting: Oncology

## 2013-12-26 NOTE — Progress Notes (Signed)
Called and spoke with the patient about the 1000.00 alight fund. She knows she must be in active treatment and will see once she starts.

## 2013-12-27 ENCOUNTER — Other Ambulatory Visit: Payer: Self-pay | Admitting: Oncology

## 2013-12-27 ENCOUNTER — Ambulatory Visit: Payer: Self-pay | Admitting: Oncology

## 2013-12-30 ENCOUNTER — Telehealth: Payer: Self-pay | Admitting: Oncology

## 2013-12-30 ENCOUNTER — Telehealth: Payer: Self-pay | Admitting: *Deleted

## 2013-12-30 ENCOUNTER — Encounter: Payer: Self-pay | Admitting: Genetic Counselor

## 2013-12-30 ENCOUNTER — Encounter (HOSPITAL_COMMUNITY): Payer: Self-pay | Admitting: Pharmacy Technician

## 2013-12-30 ENCOUNTER — Other Ambulatory Visit: Payer: Self-pay | Admitting: Oncology

## 2013-12-30 DIAGNOSIS — C50919 Malignant neoplasm of unspecified site of unspecified female breast: Secondary | ICD-10-CM

## 2013-12-30 NOTE — Telephone Encounter (Signed)
Received call from patient in reference to when chemotherapy will be scheduled. Patient is planning to work as long as possible and would like to get an idea of what days she will need to be off from work. Spoke with Dr. Jana Hakim. Left VM for patient that this will be discussed with her at her appt on 9/25.

## 2013-12-30 NOTE — Progress Notes (Signed)
HPI:  [name] was previously seen in the [place] clinic due to a personal history of cancer and concerns regarding a hereditary predisposition to cancer. Please refer to our prior cancer genetics clinic note for more information regarding [name]'s medical, social and family histories, and our assessment and recommendations, at the time. [name] recent genetic test results were disclosed to her, as were recommendations warranted by these results. These results and recommendations are discussed in more detail below.  GENETIC TEST RESULTS: At the time of [name]'s visit, we recommended she pursue genetic testing of the BRCA1 and BRCA2 genes. This test, which included sequencing and deletion/duplication analysis of the genes, was performed at [facility]. Genetic testing was normal, and did not reveal a deleterious mutation in these genes. The test report will be scanned into EP[facility] under the media tab.    We discussed with [name] that since the current genetic testing is not perfect, it is possible there may be a gene mutation in one of these genes that current testing cannot detect, but that chance is small.  We also discussed, that it is possible that another gene that has not yet been discovered, or that we have not yet tested, is responsible for the cancer diagnoses in the family, and it is, therefore, important to remain in touch with cancer genetics in the future so that we can continue to offer [name] the most up to date genetic testing.   ADDITIONAL GENETIC TESTING: We discussed with [name] that there are other genes that are associated with increased cancer risk that can be analyzed. The laboratories that offer such testing look at these additional genes via a hereditary cancer gene panel. Should [name] wish to pursue such testing, we are happy to discuss and coordinate this testing at any time.    CANCER SCREENING RECOMMENDATIONS: This  result is reassuring and suggests that [name]'s cancer was most likely not due to an inherited predisposition associated with one of these genes.  Most cancers happen by chance and this negative test, along with details of her family history, suggests that her cancer falls into this category.  We, therefore, recommended she continue to follow the cancer management and screening guidelines provided by her oncology and primary providers.   RECOMMENDATIONS FOR FAMILY MEMBERS:  Women in this family might be at some increased risk of developing cancer, over the general population risk, simply due to the family history of cancer.  We recommended women in this family have a yearly mammogram beginning at age 83, an an annual clinical breast exam, and perform monthly breast self-exams. Women in this family should also have a gynecological exam as recommended by their primary provider. All family members should have a colonoscopy by age 12.  FOLLOW-UP: Lastly, we discussed with [name] that cancer genetics is a rapidly advancing field and it is possible that new genetic tests will be appropriate for her and/or her family members in the future. We encouraged her to remain in contact with cancer genetics on an annual basis so we can update her personal and family histories and let her know of advances in cancer genetics that may benefit this family.   Our contact number was provided. [name] questions were answered to her satisfaction, and she knows she is welcome to call us at anytime with additional questions or concerns.   Catherine A. Fine, MS, CGC Certified Genetic Counseor [email or web address]

## 2013-12-30 NOTE — Telephone Encounter (Signed)
s/w pt and advised on Sept appt...done....pt ok and aware

## 2013-12-30 NOTE — Pre-Procedure Instructions (Signed)
Jessica [name]  12/30/2013   Your procedure is scheduled on: Thursday, Sept. 17, 2015  Report to [facility] Admitting at 7:30 AM.  Call this number if you have problems the morning of surgery: [phone]   Remember:   Do not eat food or drink liquids after midnight Wednesday, Sept. 16, 2015   Take these medicines the morning of surgery with A SIP OF WATER: SYMBICORT  if needed:LORazepam (ATIVAN) for anxiety, traMADol (ULTRAM), ipratropium (ATROVENT) inhaler for shortness of breath or wheezing ( bring inhaler with you on day of procedure) Stop taking Aspirin,vitamins, and herbal medications. Do not take any NSAIDs ie: Ibuprofen, Advil, Naproxen or any medication containing Aspirin.   Do not wear jewelry, make-up or nail polish.  Do not wear lotions, powders, or perfumes. You may not wear deodorant.  Do not shave 48 hours prior to surgery.   Do not bring valuables to the hospital.  [facility] is not responsible for any belongings or valuables.               Contacts, dentures or bridgework may not be worn into surgery.  Leave suitcase in the car. After surgery it may be brought to your room.  For patients admitted to the hospital, discharge time is determined by your treatment team.               Patients discharged the day of surgery will not be allowed to drive home.  Name and phone number of your driver:   Special Instructions:  Special Instructions:Special Instructions: [facility] - Preparing for Surgery  Before surgery, you can play an important role.  Because skin is not sterile, your skin needs to be as free of germs as possible.  You can reduce the number of germs on you skin by washing with CHG (chlorahexidine gluconate) soap before surgery.  CHG is an antiseptic cleaner which kills germs and bonds with the skin to continue killing germs even after washing.  Please DO NOT use if you have an allergy to CHG or antibacterial soaps.  If your skin becomes  reddened/irritated stop using the CHG and inform your nurse when you arrive at Short Stay.  Do not shave (including legs and underarms) for at least 48 hours prior to the first CHG shower.  You may shave your face.  Please follow these instructions carefully:   1.  Shower with CHG Soap the night before surgery and the morning of Surgery.  2.  If you choose to wash your hair, wash your hair first as usual with your normal shampoo.  3.  After you shampoo, rinse your hair and body thoroughly to remove the Shampoo.  4.  Use CHG as you would any other liquid soap.  You can apply chg directly  to the skin and wash gently with scrungie or a clean washcloth.  5.  Apply the CHG Soap to your body ONLY FROM THE NECK DOWN.  Do not use on open wounds or open sores.  Avoid contact with your eyes, ears, mouth and genitals (private parts).  Wash genitals (private parts) with your normal soap.  6.  Wash thoroughly, paying special attention to the area where your surgery will be performed.  7.  Thoroughly rinse your body with warm water from the neck down.  8.  DO NOT shower/wash with your normal soap after using and rinsing off the CHG Soap.  9.  Pat yourself dry with a clean towel.  10.  Wear clean pajamas.            11.  Place clean sheets on your bed the night of your first shower and do not sleep with pets.  Day of Surgery  Do not apply any lotions/deodorants the morning of surgery.  Please wear clean clothes to the hospital/surgery center.   Please read over the following fact sheets that you were given: Pain Booklet, Coughing and Deep Breathing and Surgical Site Infection Prevention

## 2013-12-31 ENCOUNTER — Encounter (HOSPITAL_COMMUNITY)
Admission: RE | Admit: 2013-12-31 | Discharge: 2013-12-31 | Disposition: A | Discharge status: Home / Self Care | Payer: Medicaid Other | Source: Ambulatory Visit | Attending: General Surgery | Admitting: General Surgery

## 2013-12-31 ENCOUNTER — Encounter (HOSPITAL_COMMUNITY)
Admission: RE | Admit: 2013-12-31 | Discharge: 2013-12-31 | Disposition: A | Discharge status: Home / Self Care | Payer: Medicaid Other | Source: Ambulatory Visit | Attending: Anesthesiology | Admitting: Anesthesiology

## 2013-12-31 ENCOUNTER — Encounter (HOSPITAL_COMMUNITY): Payer: Self-pay

## 2013-12-31 DIAGNOSIS — F411 Generalized anxiety disorder: Secondary | ICD-10-CM | POA: Diagnosis not present

## 2013-12-31 DIAGNOSIS — C50919 Malignant neoplasm of unspecified site of unspecified female breast: Secondary | ICD-10-CM | POA: Diagnosis present

## 2013-12-31 DIAGNOSIS — F3289 Other specified depressive episodes: Secondary | ICD-10-CM | POA: Diagnosis not present

## 2013-12-31 DIAGNOSIS — F17200 Nicotine dependence, unspecified, uncomplicated: Secondary | ICD-10-CM | POA: Diagnosis not present

## 2013-12-31 DIAGNOSIS — Z9089 Acquired absence of other organs: Secondary | ICD-10-CM | POA: Diagnosis not present

## 2013-12-31 DIAGNOSIS — R232 Flushing: Secondary | ICD-10-CM | POA: Diagnosis not present

## 2013-12-31 DIAGNOSIS — Z90710 Acquired absence of both cervix and uterus: Secondary | ICD-10-CM | POA: Diagnosis not present

## 2013-12-31 DIAGNOSIS — J449 Chronic obstructive pulmonary disease, unspecified: Secondary | ICD-10-CM | POA: Diagnosis not present

## 2013-12-31 DIAGNOSIS — F329 Major depressive disorder, single episode, unspecified: Secondary | ICD-10-CM | POA: Diagnosis not present

## 2013-12-31 HISTORY — DX: Family history of other specified conditions: Z84.89

## 2013-12-31 HISTORY — DX: Shortness of breath: R06.02

## 2013-12-31 HISTORY — DX: Cardiac murmur, unspecified: R01.1

## 2013-12-31 HISTORY — DX: Heartburn: R12

## 2013-12-31 HISTORY — DX: Malignant (primary) neoplasm, unspecified: C80.1

## 2013-12-31 HISTORY — DX: Ventricular premature depolarization: I49.3

## 2013-12-31 LAB — BASIC METABOLIC PANEL
Anion gap: 12 (ref 5–15)
BUN: 9 mg/dL (ref 6–23)
CALCIUM: 8.8 mg/dL (ref 8.4–10.5)
CO2: 24 meq/L (ref 19–32)
Chloride: 103 mEq/L (ref 96–112)
Creatinine, Ser: 0.83 mg/dL (ref 0.50–1.10)
GFR calc Af Amer: >90 mL/min (ref >90)
GFR calc non Af Amer: 83 mL/min — ABNORMAL LOW (ref >90)
GLUCOSE: 93 mg/dL (ref 70–99)
Potassium: 4.6 mEq/L (ref 3.7–5.3)
SODIUM: 139 meq/L (ref 137–147)

## 2013-12-31 LAB — CBC
HEMATOCRIT: 42.4 % (ref 36.0–46.0)
Hemoglobin: 14.6 g/dL (ref 12.0–15.0)
MCH: 32.8 pg (ref 26.0–34.0)
MCHC: 34.4 g/dL (ref 30.0–36.0)
MCV: 95.3 fL (ref 78.0–100.0)
Platelets: 180 10*3/uL (ref 150–400)
RBC: 4.45 MIL/uL (ref 3.87–5.11)
RDW: 13.2 % (ref 11.5–15.5)
WBC: 7.9 10*3/uL (ref 4.0–10.5)

## 2014-01-01 DIAGNOSIS — C50919 Malignant neoplasm of unspecified site of unspecified female breast: Secondary | ICD-10-CM | POA: Diagnosis not present

## 2014-01-01 MED ORDER — CEFAZOLIN SODIUM-DEXTROSE 2-3 GM-% IV SOLR
2.0000 g | INTRAVENOUS | Status: AC
  Administered 2014-01-02: 2 g via INTRAVENOUS
  Filled 2014-01-01: qty 50

## 2014-01-01 NOTE — Progress Notes (Signed)
Anesthesia Chart Review:  Patient is a 48 year old female scheduled for Port placement on 01/02/14 by Dr. Donne Hazel.  History includes left breast cancer, smoking, COPD, anxiety, depression, PVCs (noted at age 61), murmur (heard at age 67, but not recently), asthma, heartburn, hysterectomy '15. BMI is consistent with obesity.  PCP is listed as Dr. [name].    Pre-chemo echo showed:  - Left ventricle: Average LV strain is -17.9% which is mildly reduced. The cavity size was normal. Systolic function was normal. The estimated ejection fraction was in the range of 50% to 55%. Images were inadequate for LV wall motion assessment. - Aortic valve: Poorly visualized.  EKG on 12/31/13 showed: NSR with sinus arrythmia.   CXR on 12/31/13 showed: No active cardiopulmonary disease.  Preoperative labs noted.  Anticipate she can proceed as planned.  George Hugh [facility] Short Stay Center/Anesthesiology Phone [phone] 01/01/2014 9:49 AM

## 2014-01-02 ENCOUNTER — Ambulatory Visit (HOSPITAL_COMMUNITY): Payer: Medicaid Other

## 2014-01-02 ENCOUNTER — Ambulatory Visit (HOSPITAL_COMMUNITY)
Admission: RE | Admit: 2014-01-02 | Discharge: 2014-01-02 | Disposition: A | Discharge status: Home / Self Care | Payer: Medicaid Other | Source: Ambulatory Visit | Attending: General Surgery | Admitting: General Surgery

## 2014-01-02 ENCOUNTER — Encounter (HOSPITAL_COMMUNITY): Payer: Medicaid Other | Admitting: Vascular Surgery

## 2014-01-02 ENCOUNTER — Encounter (HOSPITAL_COMMUNITY): Payer: Self-pay | Admitting: *Deleted

## 2014-01-02 ENCOUNTER — Ambulatory Visit (HOSPITAL_COMMUNITY): Payer: Medicaid Other | Admitting: Anesthesiology

## 2014-01-02 ENCOUNTER — Encounter (HOSPITAL_COMMUNITY)
Admission: RE | Disposition: A | Discharge status: Home / Self Care | Payer: Self-pay | Source: Ambulatory Visit | Attending: General Surgery

## 2014-01-02 DIAGNOSIS — F329 Major depressive disorder, single episode, unspecified: Secondary | ICD-10-CM | POA: Insufficient documentation

## 2014-01-02 DIAGNOSIS — C50919 Malignant neoplasm of unspecified site of unspecified female breast: Secondary | ICD-10-CM | POA: Diagnosis not present

## 2014-01-02 DIAGNOSIS — F3289 Other specified depressive episodes: Secondary | ICD-10-CM | POA: Insufficient documentation

## 2014-01-02 DIAGNOSIS — Z90710 Acquired absence of both cervix and uterus: Secondary | ICD-10-CM | POA: Insufficient documentation

## 2014-01-02 DIAGNOSIS — F411 Generalized anxiety disorder: Secondary | ICD-10-CM | POA: Diagnosis not present

## 2014-01-02 DIAGNOSIS — R232 Flushing: Secondary | ICD-10-CM | POA: Insufficient documentation

## 2014-01-02 DIAGNOSIS — F17200 Nicotine dependence, unspecified, uncomplicated: Secondary | ICD-10-CM | POA: Insufficient documentation

## 2014-01-02 DIAGNOSIS — J449 Chronic obstructive pulmonary disease, unspecified: Secondary | ICD-10-CM | POA: Insufficient documentation

## 2014-01-02 DIAGNOSIS — Z9089 Acquired absence of other organs: Secondary | ICD-10-CM | POA: Insufficient documentation

## 2014-01-02 DIAGNOSIS — J4489 Other specified chronic obstructive pulmonary disease: Secondary | ICD-10-CM | POA: Insufficient documentation

## 2014-01-02 HISTORY — PX: PORTACATH PLACEMENT: SHX2246

## 2014-01-02 SURGERY — INSERTION, TUNNELED CENTRAL VENOUS DEVICE, WITH PORT
Anesthesia: General | Site: Chest | Laterality: Right

## 2014-01-02 MED ORDER — LACTATED RINGERS IV SOLN
INTRAVENOUS | Status: DC
  Administered 2014-01-02: 08:00:00 via INTRAVENOUS

## 2014-01-02 MED ORDER — MIDAZOLAM HCL 5 MG/5ML IJ SOLN
INTRAMUSCULAR | Status: DC | PRN
  Administered 2014-01-02: 1 mg via INTRAVENOUS

## 2014-01-02 MED ORDER — BUPIVACAINE HCL (PF) 0.25 % IJ SOLN
INTRAMUSCULAR | Status: AC
  Filled 2014-01-02: qty 30

## 2014-01-02 MED ORDER — FENTANYL CITRATE 0.05 MG/ML IJ SOLN
INTRAMUSCULAR | Status: AC
  Filled 2014-01-02: qty 5

## 2014-01-02 MED ORDER — PROPOFOL 10 MG/ML IV BOLUS
INTRAVENOUS | Status: AC
Start: 2014-01-02 — End: 2014-01-02
  Filled 2014-01-02: qty 20

## 2014-01-02 MED ORDER — NEOSTIGMINE METHYLSULFATE 10 MG/10ML IV SOLN
INTRAVENOUS | Status: AC
  Filled 2014-01-02: qty 1

## 2014-01-02 MED ORDER — OXYCODONE HCL 5 MG/5ML PO SOLN: 5.0000 mg | Freq: Once | ORAL | Status: DC | PRN

## 2014-01-02 MED ORDER — FENTANYL CITRATE 0.05 MG/ML IJ SOLN
INTRAMUSCULAR | Status: DC | PRN
  Administered 2014-01-02 (×3): 50 ug via INTRAVENOUS

## 2014-01-02 MED ORDER — ONDANSETRON HCL 4 MG/2ML IJ SOLN
INTRAMUSCULAR | Status: DC | PRN
  Administered 2014-01-02: 4 mg via INTRAVENOUS

## 2014-01-02 MED ORDER — OXYCODONE-ACETAMINOPHEN 10-325 MG PO TABS: 1.0000 | ORAL_TABLET | Freq: Four times a day (QID) | ORAL | Status: DC | PRN

## 2014-01-02 MED ORDER — HEPARIN SOD (PORK) LOCK FLUSH 100 UNIT/ML IV SOLN
INTRAVENOUS | Status: AC
  Filled 2014-01-02: qty 5

## 2014-01-02 MED ORDER — OXYCODONE HCL 5 MG PO TABS: 5.0000 mg | ORAL_TABLET | Freq: Once | ORAL | Status: DC | PRN

## 2014-01-02 MED ORDER — SODIUM CHLORIDE 0.9 % IR SOLN
Status: DC | PRN
  Administered 2014-01-02: 10:00:00

## 2014-01-02 MED ORDER — ONDANSETRON HCL 4 MG/2ML IJ SOLN: 4.0000 mg | Freq: Four times a day (QID) | INTRAMUSCULAR | Status: DC | PRN

## 2014-01-02 MED ORDER — HYDROMORPHONE HCL 1 MG/ML IJ SOLN
INTRAMUSCULAR | Status: AC
  Administered 2014-01-02: 0.5 mg via INTRAVENOUS
  Filled 2014-01-02: qty 1

## 2014-01-02 MED ORDER — LACTATED RINGERS IV SOLN
INTRAVENOUS | Status: DC | PRN
  Administered 2014-01-02: 09:00:00 via INTRAVENOUS

## 2014-01-02 MED ORDER — BUPIVACAINE HCL (PF) 0.25 % IJ SOLN
INTRAMUSCULAR | Status: DC | PRN
  Administered 2014-01-02: 18 mL

## 2014-01-02 MED ORDER — MIDAZOLAM HCL 2 MG/2ML IJ SOLN
INTRAMUSCULAR | Status: AC
  Filled 2014-01-02: qty 2

## 2014-01-02 MED ORDER — SODIUM CHLORIDE 0.9 % IJ SOLN
INTRAMUSCULAR | Status: AC
  Filled 2014-01-02: qty 10

## 2014-01-02 MED ORDER — PROPOFOL 10 MG/ML IV BOLUS
INTRAVENOUS | Status: DC | PRN
  Administered 2014-01-02: 150 mg via INTRAVENOUS

## 2014-01-02 MED ORDER — ROCURONIUM BROMIDE 50 MG/5ML IV SOLN
INTRAVENOUS | Status: AC
  Filled 2014-01-02: qty 1

## 2014-01-02 MED ORDER — ONDANSETRON HCL 4 MG/2ML IJ SOLN
INTRAMUSCULAR | Status: AC
  Filled 2014-01-02: qty 2

## 2014-01-02 MED ORDER — HEPARIN SOD (PORK) LOCK FLUSH 100 UNIT/ML IV SOLN
INTRAVENOUS | Status: DC | PRN
  Administered 2014-01-02: 400 [IU] via INTRAVENOUS

## 2014-01-02 MED ORDER — HYDROMORPHONE HCL 1 MG/ML IJ SOLN
0.2500 mg | INTRAMUSCULAR | Status: DC | PRN
Start: 2014-01-02 — End: 2014-01-02
  Administered 2014-01-02 (×3): 0.5 mg via INTRAVENOUS

## 2014-01-02 MED ORDER — HYDROMORPHONE HCL 1 MG/ML IJ SOLN
INTRAMUSCULAR | Status: AC
  Filled 2014-01-02: qty 1

## 2014-01-02 MED ORDER — GLYCOPYRROLATE 0.2 MG/ML IJ SOLN
INTRAMUSCULAR | Status: AC
  Filled 2014-01-02: qty 2

## 2014-01-02 MED ORDER — ARTIFICIAL TEARS OP OINT
TOPICAL_OINTMENT | OPHTHALMIC | Status: AC
  Filled 2014-01-02: qty 3.5

## 2014-01-02 MED ORDER — DEXAMETHASONE SODIUM PHOSPHATE 4 MG/ML IJ SOLN
INTRAMUSCULAR | Status: DC | PRN
  Administered 2014-01-02: 4 mg via INTRAVENOUS

## 2014-01-02 MED ORDER — LIDOCAINE HCL (CARDIAC) 20 MG/ML IV SOLN
INTRAVENOUS | Status: DC | PRN
  Administered 2014-01-02: 60 mg via INTRAVENOUS

## 2014-01-02 SURGICAL SUPPLY — 56 items
BAG DECANTER FOR FLEXI CONT (MISCELLANEOUS) ×3 IMPLANT
BLADE SURG 11 STRL SS (BLADE) ×3 IMPLANT
BLADE SURG 15 STRL LF DISP TIS (BLADE) ×1 IMPLANT
BLADE SURG 15 STRL SS (BLADE) ×2
CANISTER SUCTION 2500CC (MISCELLANEOUS) IMPLANT
CHLORAPREP W/TINT 26ML (MISCELLANEOUS) ×3 IMPLANT
COVER SURGICAL LIGHT HANDLE (MISCELLANEOUS) ×3 IMPLANT
CRADLE DONUT ADULT HEAD (MISCELLANEOUS) ×3 IMPLANT
DECANTER SPIKE VIAL GLASS SM (MISCELLANEOUS) ×3 IMPLANT
DERMABOND ADVANCED (GAUZE/BANDAGES/DRESSINGS) ×2
DERMABOND ADVANCED .7 DNX12 (GAUZE/BANDAGES/DRESSINGS) ×1 IMPLANT
DRAPE C-ARM 42X72 X-RAY (DRAPES) ×3 IMPLANT
DRAPE LAPAROSCOPIC ABDOMINAL (DRAPES) ×3 IMPLANT
ELECT CAUTERY BLADE 6.4 (BLADE) ×3 IMPLANT
ELECT REM PT RETURN 9FT ADLT (ELECTROSURGICAL) ×3
ELECTRODE REM PT RTRN 9FT ADLT (ELECTROSURGICAL) ×1 IMPLANT
GAUZE SPONGE 4X4 16PLY XRAY LF (GAUZE/BANDAGES/DRESSINGS) ×3 IMPLANT
GLOVE BIO SURGEON STRL SZ7 (GLOVE) ×6 IMPLANT
GLOVE BIOGEL PI IND STRL 6.5 (GLOVE) ×1 IMPLANT
GLOVE BIOGEL PI IND STRL 7.0 (GLOVE) ×1 IMPLANT
GLOVE BIOGEL PI IND STRL 7.5 (GLOVE) ×1 IMPLANT
GLOVE BIOGEL PI INDICATOR 6.5 (GLOVE) ×2
GLOVE BIOGEL PI INDICATOR 7.0 (GLOVE) ×2
GLOVE BIOGEL PI INDICATOR 7.5 (GLOVE) ×2
GOWN STRL REUS W/ TWL LRG LVL3 (GOWN DISPOSABLE) ×2 IMPLANT
GOWN STRL REUS W/TWL LRG LVL3 (GOWN DISPOSABLE) ×4
INTRODUCER COOK 11FR (CATHETERS) IMPLANT
KIT BASIN OR (CUSTOM PROCEDURE TRAY) ×3 IMPLANT
KIT PORT POWER 8FR ISP CVUE (Catheter) ×3 IMPLANT
KIT PORT POWER 9.6FR MRI PREA (Catheter) IMPLANT
KIT PORT POWER ISP 8FR (Catheter) IMPLANT
KIT POWER CATH 8FR (Catheter) IMPLANT
KIT ROOM TURNOVER OR (KITS) ×3 IMPLANT
NEEDLE HYPO 25GX1X1/2 BEV (NEEDLE) ×3 IMPLANT
NS IRRIG 1000ML POUR BTL (IV SOLUTION) ×3 IMPLANT
PACK SURGICAL SETUP 50X90 (CUSTOM PROCEDURE TRAY) ×3 IMPLANT
PAD ARMBOARD 7.5X6 YLW CONV (MISCELLANEOUS) ×6 IMPLANT
PENCIL BUTTON HOLSTER BLD 10FT (ELECTRODE) ×3 IMPLANT
SET INTRODUCER 12FR PACEMAKER (SHEATH) IMPLANT
SET SHEATH INTRODUCER 10FR (MISCELLANEOUS) IMPLANT
SHEATH COOK PEEL AWAY SET 9F (SHEATH) IMPLANT
SUT MNCRL AB 4-0 PS2 18 (SUTURE) ×3 IMPLANT
SUT PROLENE 2 0 SH DA (SUTURE) ×3 IMPLANT
SUT SILK 2 0 (SUTURE)
SUT SILK 2-0 18XBRD TIE 12 (SUTURE) IMPLANT
SUT VIC AB 3-0 SH 27 (SUTURE) ×2
SUT VIC AB 3-0 SH 27XBRD (SUTURE) ×1 IMPLANT
SYR 20ML ECCENTRIC (SYRINGE) ×6 IMPLANT
SYR 5ML LUER SLIP (SYRINGE) ×3 IMPLANT
SYR CONTROL 10ML LL (SYRINGE) IMPLANT
TOWEL OR 17X24 6PK STRL BLUE (TOWEL DISPOSABLE) ×3 IMPLANT
TOWEL OR 17X26 10 PK STRL BLUE (TOWEL DISPOSABLE) ×3 IMPLANT
TUBE CONNECTING 12'X1/4 (SUCTIONS)
TUBE CONNECTING 12X1/4 (SUCTIONS) IMPLANT
WATER STERILE IRR 1000ML POUR (IV SOLUTION) IMPLANT
YANKAUER SUCT BULB TIP NO VENT (SUCTIONS) IMPLANT

## 2014-01-02 NOTE — Interval H&P Note (Signed)
History and Physical Interval Note:  01/02/2014 9:12 AM  Jessica [name]  has presented today for surgery, with the diagnosis of breast cancer  The various methods of treatment have been discussed with the patient and family. After consideration of risks, benefits and other options for treatment, the patient has consented to  Procedure(s): INSERTION PORT-A-CATH (N/A) as a surgical intervention .  [name] history has been reviewed, patient examined, no change in status, stable for surgery.  I have reviewed the patient's chart and labs.  Questions were answered to the patient's satisfaction.     Fumiye Lubben

## 2014-01-02 NOTE — Anesthesia Postprocedure Evaluation (Signed)
  Anesthesia Post-op Note  Patient: Jessica [name]  Procedure(s) Performed: Procedure(s): INSERTION PORT-A-CATH (Right)  Patient Location: PACU  Anesthesia Type:General  Level of Consciousness: awake  Airway and Oxygen Therapy: Patient Spontanous Breathing  Post-op Pain: mild  Post-op Assessment: Post-op Vital signs reviewed  Post-op Vital Signs: Reviewed  Last Vitals:  Filed Vitals:   01/02/14 0750  BP: 144/81  Pulse: 70  Temp: 36.9 C  Resp: 18    Complications: No apparent anesthesia complications

## 2014-01-02 NOTE — Transfer of Care (Signed)
Immediate Anesthesia Transfer of Care Note  Patient: Jessica [name]  Procedure(s) Performed: Procedure(s): INSERTION PORT-A-CATH (Right)  Patient Location: PACU  Anesthesia Type:General  Level of Consciousness: awake and alert   Airway & Oxygen Therapy: Patient Spontanous Breathing and Patient connected to face mask oxygen  Post-op Assessment: Report given to PACU RN  Post vital signs: Reviewed and stable  Complications: No apparent anesthesia complications

## 2014-01-02 NOTE — Discharge Instructions (Signed)
    PORT-A-CATH: POST OP INSTRUCTIONS  Always review your discharge instruction sheet given to you by the facility where your surgery was performed.   1. A prescription for pain medication may be given to you upon discharge. Take your pain medication as prescribed, if needed. If narcotic pain medicine is not needed, then you make take acetaminophen (Tylenol) or ibuprofen (Advil) as needed.  2. Take your usually prescribed medications unless otherwise directed. 3. If you need a refill on your pain medication, please contact our office. All narcotic pain medicine now requires a paper prescription.  Phoned in and fax refills are no longer allowed by law.  Prescriptions will not be filled after 5 pm or on weekends.  4. You should follow a light diet for the remainder of the day after your procedure. 5. Most patients will experience some mild swelling and/or bruising in the area of the incision. It may take several days to resolve. 6. It is common to experience some constipation if taking pain medication after surgery. Increasing fluid intake and taking a stool softener (such as Colace) will usually help or prevent this problem from occurring. A mild laxative (Milk of Magnesia or Miralax) should be taken according to package directions if there are no bowel movements after 48 hours.  7. Unless discharge instructions indicate otherwise, you may remove your bandages 48 hours after surgery, and you may shower at that time. You may have steri-strips (small white skin tapes) in place directly over the incision.  These strips should be left on the skin for 7-10 days.  If your surgeon used Dermabond (skin glue) on the incision, you may shower in 24 hours.  The glue will flake off over the next 2-3 weeks.  8. If your port is left accessed at the end of surgery (needle left in port), the dressing cannot get wet and should only by changed by a healthcare professional. When the port is no longer accessed (when the  needle has been removed), follow step 7.   9. ACTIVITIES:  Limit activity involving your arms for the next 72 hours. Do no strenuous exercise or activity for 1 week. You may drive when you are no longer taking prescription pain medication, you can comfortably wear a seatbelt, and you can maneuver your car. 10.You may need to see your doctor in the office for a follow-up appointment.  Please       check with your doctor.  11.When you receive a new Port-a-Cath, you will get a product guide and        ID card.  Please keep them in case you need them.  WHEN TO CALL YOUR DOCTOR ([phone]): 1. Fever over 101.0 2. Chills 3. Continued bleeding from incision 4. Increased redness and tenderness at the site 5. Shortness of breath, difficulty breathing   The clinic staff is available to answer your questions during regular business hours. Please don't hesitate to call and ask to speak to one of the nurses or medical assistants for clinical concerns. If you have a medical emergency, go to the nearest emergency room or call 911.  A surgeon from [facility] is always on call at the hospital.     For further information, please visit [email or web address]      

## 2014-01-02 NOTE — Anesthesia Preprocedure Evaluation (Addendum)
Anesthesia Evaluation  Patient identified by MRN, date of birth, ID band Patient awake    Reviewed: Allergy & Precautions, H&P , NPO status , Patient's Chart, lab work & pertinent test results  Airway Mallampati: II  Neck ROM: full    Dental   Pulmonary asthma , COPDCurrent Smoker,          Cardiovascular negative cardio ROS      Neuro/Psych Anxiety Depression    GI/Hepatic   Endo/Other  obese  Renal/GU      Musculoskeletal   Abdominal   Peds  Hematology   Anesthesia Other Findings   Reproductive/Obstetrics                          Anesthesia Physical Anesthesia Plan  ASA: II  Anesthesia Plan: General   Post-op Pain Management:    Induction: Intravenous  Airway Management Planned: LMA  Additional Equipment:   Intra-op Plan:   Post-operative Plan:   Informed Consent: I have reviewed the patients History and Physical, chart, labs and discussed the procedure including the risks, benefits and alternatives for the proposed anesthesia with the patient or authorized representative who has indicated his/her understanding and acceptance.     Plan Discussed with: CRNA, Anesthesiologist and Surgeon  Anesthesia Plan Comments:         Anesthesia Quick Evaluation

## 2014-01-02 NOTE — H&P (View-Only) (Signed)
Patient ID: Jessica [name], female   DOB: 03-30-1966, 48 y.o.   MRN: [id number]  Chief Complaint  Patient presents with  . Other    HPI Jessica [name] is a 48 y.o. female.  Referred by Dr Lovey Newcomer HPI 76 yof who noted a palpable left breast mass, she has breast density category d, about a month ago. No nipple discharge.  On ultrasound this mass was 1.5x1.5x1 cm mass. On mri this is 2.9x2x2.7 cm with 6 cm of NME posterior to this.  There is an additional 6x8x8 mm upper inner left breast mass.  She comes in today to discuss options.  Past Medical History  Diagnosis Date  . Anxiety   . Asthma   . COPD (chronic obstructive pulmonary disease) 2011    does not use inhaler ofter  . Depression   . Hot flashes     Past Surgical History  Procedure Laterality Date  . Polyp removal     . Tonsillectomy    . Wisdom tooth extraction    . Robotic assisted total hysterectomy with bilateral salpingo oopherectomy Bilateral 05/24/2013    Procedure: ROBOTIC ASSISTED TOTAL HYSTERECTOMY WITH BILATERAL SALPINGECTOMY;  Surgeon: Lavonia Drafts, MD;  Location: [facility];  Service: Gynecology;  Laterality: Bilateral;  . Cystoscopy N/A 05/24/2013    Procedure: CYSTOSCOPY;  Surgeon: Lavonia Drafts, MD;  Location: [facility];  Service: Gynecology;  Laterality: N/A;  . Abdominal hysterectomy      Family History  Problem Relation Age of Onset  . Heart disease Mother   . Heart disease Father   . COPD Father     Social History History  Substance Use Topics  . Smoking status: Current Every Day Smoker -- 1.00 packs/day for 16 years    Types: Cigarettes  . Smokeless tobacco: Never Used  . Alcohol Use: Yes     Comment: occasional     No Known Allergies  Current Outpatient Prescriptions  Medication Sig Dispense Refill  . budesonide-formoterol (SYMBICORT) 160-4.5 MCG/ACT inhaler Inhale 1 puff into the lungs 2 (two) times daily.       . calcium carbonate (TUMS - DOSED IN MG ELEMENTAL CALCIUM) 500  MG chewable tablet Chew 1 tablet by mouth daily.      Marland Kitchen gabapentin (NEURONTIN) 600 MG tablet Take 600 mg by mouth at bedtime as needed.      Marland Kitchen ibuprofen (ADVIL,MOTRIN) 800 MG tablet Take 1 tablet (800 mg total) by mouth every 8 (eight) hours as needed.  30 tablet  0  . ipratropium (ATROVENT HFA) 17 MCG/ACT inhaler Inhale 1 puff into the lungs as needed for wheezing (SOB).      Marland Kitchen traMADol (ULTRAM) 50 MG tablet Take 1 tablet (50 mg total) by mouth every 6 (six) hours as needed.  30 tablet  0   No current facility-administered medications for this visit.    Review of Systems Review of Systems  Constitutional: Positive for fatigue. Negative for fever, chills and unexpected weight change.  HENT: Negative for congestion, hearing loss, sore throat, trouble swallowing and voice change.   Eyes: Negative for visual disturbance.  Respiratory: Negative for cough and wheezing.   Cardiovascular: Negative for chest pain, palpitations and leg swelling.  Gastrointestinal: Negative for nausea, vomiting, abdominal pain, diarrhea, constipation, blood in stool, abdominal distention and anal bleeding.  Genitourinary: Negative for hematuria, vaginal bleeding and difficulty urinating.  Musculoskeletal: Positive for back pain. Negative for arthralgias.  Skin: Negative for rash and wound.  Neurological: Negative for seizures,  syncope and headaches.  Hematological: Negative for adenopathy. Does not bruise/bleed easily.  Psychiatric/Behavioral: Negative for confusion.    Last menstrual period 03/18/2013.  Physical Exam Physical Exam  Vitals reviewed. Constitutional: She appears well-developed and well-nourished.  Neck: Neck supple.  Cardiovascular: Normal rate, regular rhythm and normal heart sounds.   Pulmonary/Chest: Effort normal and breath sounds normal. She has no wheezes. She has no rales. Right breast exhibits no inverted nipple, no mass, no nipple discharge, no skin change and no tenderness. Left breast  exhibits mass. Left breast exhibits no inverted nipple, no nipple discharge, no skin change and no tenderness.    Lymphadenopathy:    She has no cervical adenopathy.    She has no axillary adenopathy.       Right: No supraclavicular adenopathy present.       Left: No supraclavicular adenopathy present.    Data Reviewed EXAM:  BILATERAL BREAST MRI WITH AND WITHOUT CONTRAST  TECHNIQUE:  Multiplanar, multisequence MR images of both breasts were obtained  prior to and following the intravenous administration of 35ml of  MultiHance.  THREE-DIMENSIONAL MR IMAGE RENDERING ON INDEPENDENT WORKSTATION:  Three-dimensional MR images were rendered by post-processing of the  original MR data on an independent workstation. The  three-dimensional MR images were interpreted, and findings are  reported in the following complete MRI report for this study. Three  dimensional images were evaluated at the independent DynaCad  workstation  COMPARISON: Previous exams  FINDINGS:  Breast composition: d. Extreme fibroglandular tissue.  Background parenchymal enhancement: Marked  Right breast: Marked background parenchymal enhancement.  Left breast: Within the upper-outer left breast middle depth there  is a 2.9 x 2.0 x 2.7 cm irregular enhancing mass with central  susceptibility artifact compatible with biopsy marking clip. 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  Extending from the posterior margin of this mass approximately 6 cm  in a linear distribution is clumped non mass enhancement with  multiple associated small nodules. The more posterior grouping of  nodules measures 2.2 x 1.9 x 2.0 cm.  Additionally within the upper inner aspect of the left breast there  is a 6 x 8 x 8 mm irregular spiculated enhancing mass.  Within the lower outer left breast anterior depth there is a 4 x 8 x  5 mm oval enhancing mass.  There is a 1.5 cm simple cyst within the 11 o'clock position  left  breast.  Lymph nodes: No abnormal appearing lymph nodes.  Ancillary findings: None.  IMPRESSION:  Irregular left breast mass compatible with biopsy-proven left breast  carcinoma.  Extending from the posterior margin is approximately 6 cm of clumped  non mass enhancement with a grouping of suspicious nodules within  the posterior lateral left breast. MRI guided core needle biopsy of  this group of nodules is recommended.  Suspicious irregular enhancing mass within the upper inner left  breast. MRI guided core needle biopsy of this mass is recommended.  Slightly suspicious oval enhancing mass within the lateral left  breast anterior depth. Recommend determining pathology results from  the above recommended MRI guided core needle biopsies and correlate  with surgical planning. If necessary for further surgical planning,  MRI guided core needle biopsy of this mass can be obtained.  RECOMMENDATION:  MRI guided core needle biopsy suspicious group of enhancing nodules  within the lower outer left breast posterior depth.  MRI guided core needle biopsy suspicious irregular enhancing mass  within the upper inner left  breast.    Assessment    Clinical stage II left breast cancer    Plan    Needs additional biopsies of the left breast inner quadrant lesion and posterior extent of disease I will see her back right after her biopsies to decide final care. She will also get genetic testing. We discussed port placement for chemo/herceptin. We also discussed that primary chemotherapy might be the best option but will make final decisions after biopsies.  We discussed the staging and pathophysiology of breast cancer. We discussed all of the different options for treatment for breast cancer including surgery, chemotherapy, radiation therapy, Herceptin, and antiestrogen therapy.   We discussed a sentinel lymph node biopsy as she does not appear to having lymph node involvement right now. We  discussed the performance of that with injection of radioactive tracer. We discussed that she would have an incision underneath her axillary hairline. We discussed that there is a chance of having a positive node with a sentinel lymph node biopsy and we will await the permanent pathology to make any other first further decisions in terms of her treatment. One of these options might be to return to the operating room to perform an axillary lymph node dissection. We discussed up to a 5% risk lifetime of chronic shoulder pain as well as lymphedema associated with a sentinel lymph node biopsy.  We discussed the options for treatment of the breast cancer which included lumpectomy versus a mastectomy. We discussed the performance of the lumpectomy with a radioactive seed placement. We discussed a chance of a positive margin requiring reexcision in the operating room. We also discussed that she may need radiation therapy or antiestrogen therapy or both if she undergoes lumpectomy. We discussed the mastectomy and the postoperative care for that as well. We discussed that there is no difference in her survival whether she undergoes lumpectomy  versus a mastectomy.  We discussed the risks of operation including bleeding, infection, possible reoperation. She understands her further therapy will be based on what her stages at the time of her operation.          Anjolie Majer 12/11/2013, 3:33 PM

## 2014-01-02 NOTE — Progress Notes (Signed)
Report given to elise rn as caregiver 

## 2014-01-02 NOTE — Addendum Note (Signed)
Addendum created 01/02/14 1212 by Lavina Hamman, CRNA   Modules edited: Charges VN

## 2014-01-02 NOTE — Op Note (Signed)
Preoperative diagnosis: Clinical stage II left breast cancer Postoperative diagnosis: Same as above Procedure: Right subclavian power port insertion Surgeon: Dr. Serita Grammes Anesthesia: Gen. Estimated blood loss: Minimal Consultations: None Drains: None Specimens: None Sponge count correct at completion Disposition to recovery stable  Indications: This 48 yo female with a clinical stage II left breast cancer that his HER-2/neu amplified. We've discussed primary chemotherapy hopefully to facilitate breast conservation therapy at a later date. We discussed port placement and the risks and benefits associated with that.  Procedure: After informed consent was obtained the patient was taken to the operating room. She was given cefazolin. Sequential compression devices were on her legs. She was placed under general anesthesia without complication. Arms were tucked and appropriately padded. Her chest was prepped and draped in the standard sterile surgical fashion. A surgical timeout was then performed.  I did a field block over the entire right chest wall as well as the clavicle with quarter percent Marcaine. I then accessed the subclavian vein on the first pass. The wire was placed and confirmed to be in position. I then made a pocket below this. I tunneled the line between the 2 sites. I dilated the tract. I then inserted the peel-away sheath removed the wire assembly. The line was then placed. The peel-away sheath was removed. I then pulled the line back to be in position in the distal cava. I then secured the port. I sutured this into place with 2-0 Prolene in 2 places. The port aspirated blood and flushed easily. I placed heparin in the port. Final fluoroscopy showed that everything was in good position. I then closed with 3-0 Vicryl, 4-0 Monocryl, and Dermabond. She tolerated this well was extubated and transferred to recovery stable.

## 2014-01-06 ENCOUNTER — Encounter (HOSPITAL_COMMUNITY): Payer: Self-pay | Admitting: General Surgery

## 2014-01-10 ENCOUNTER — Telehealth: Payer: Self-pay | Admitting: *Deleted

## 2014-01-10 ENCOUNTER — Encounter: Payer: Self-pay | Admitting: *Deleted

## 2014-01-10 ENCOUNTER — Telehealth: Payer: Self-pay | Admitting: Oncology

## 2014-01-10 ENCOUNTER — Ambulatory Visit (HOSPITAL_BASED_OUTPATIENT_CLINIC_OR_DEPARTMENT_OTHER): Payer: Medicaid Other | Admitting: Oncology

## 2014-01-10 VITALS — BP 137/78 | HR 55 | Temp 99.1°F | Resp 18 | Ht 65.0 in | Wt 196.5 lb

## 2014-01-10 DIAGNOSIS — J449 Chronic obstructive pulmonary disease, unspecified: Secondary | ICD-10-CM

## 2014-01-10 DIAGNOSIS — F17200 Nicotine dependence, unspecified, uncomplicated: Secondary | ICD-10-CM

## 2014-01-10 DIAGNOSIS — C50412 Malignant neoplasm of upper-outer quadrant of left female breast: Secondary | ICD-10-CM

## 2014-01-10 DIAGNOSIS — Z170 Estrogen receptor positive status [ER+]: Secondary | ICD-10-CM

## 2014-01-10 DIAGNOSIS — C50419 Malignant neoplasm of upper-outer quadrant of unspecified female breast: Secondary | ICD-10-CM

## 2014-01-10 MED ORDER — LIDOCAINE-PRILOCAINE 2.5-2.5 % EX CREA: 1.0000 "application " | TOPICAL_CREAM | CUTANEOUS | Status: DC | PRN

## 2014-01-10 MED ORDER — LORAZEPAM 0.5 MG PO TABS
0.5000 mg | ORAL_TABLET | Freq: Every evening | ORAL | Status: DC | PRN
Start: 2014-01-10 — End: 2014-02-04

## 2014-01-10 MED ORDER — ONDANSETRON HCL 8 MG PO TABS
8.0000 mg | ORAL_TABLET | Freq: Two times a day (BID) | ORAL | Status: DC
Start: 2014-01-10 — End: 2014-05-08

## 2014-01-10 MED ORDER — PROCHLORPERAZINE MALEATE 10 MG PO TABS: 10.0000 mg | ORAL_TABLET | Freq: Four times a day (QID) | ORAL | Status: DC | PRN

## 2014-01-10 MED ORDER — DEXAMETHASONE 4 MG PO TABS: 8.0000 mg | ORAL_TABLET | Freq: Two times a day (BID) | ORAL | Status: DC

## 2014-01-10 MED ORDER — TOBRAMYCIN-DEXAMETHASONE 0.3-0.1 % OP SUSP: 1.0000 [drp] | Freq: Two times a day (BID) | OPHTHALMIC | Status: DC

## 2014-01-10 NOTE — Progress Notes (Signed)
[place]  [phone] [phone]     ID: Jessica [name] DOB: October 18, 1965  MR#: [id number]  WUX#:[id number]  Patient Care Team: [name], MD as PCP - General (Nurse Practitioner) [name], MD as Consulting Physician (General Surgery) [name], MD as Consulting Physician (Oncology) [name], MD as Consulting Physician (Radiation Oncology)  CHIEF COMPLAINT: Newly diagnosed triple positive breast cancer CURRENT TREATMENT: neoadjuvant chemo-immunotherapy  BREAST CANCER HISTORY: From the original intake note:  Jessica [name] herself palpated a mass in her left breast. She brought it to the attention of [name] at the health department and she was set up for unilateral left mammography and ultrasound 11/27/2013 at the [facility]. This showed a possible distortion in the left breast upper outer quadrant. However the patient's breasts are density category D. On exam there was a firm nodule or mass in the left breast 2:00 location which by ultrasound was irregular and hypoechoic and measured 1.5 cm. There was no left axillary lymphadenopathy noted.  Biopsy of the mass in question 11/29/2013 showed ([id number]) an invasive ductal carcinoma, grade 1, estrogen receptor 94% positive, progesterone receptor 94% positive, both with strong staining intensity, with an MIB-1 of 46%, and HER-2 amplified by CISH, the signals ratio of 4.85 and the number per cell 6.55.  : 12/08/2013 the patient underwent bilateral breast MRI. This showed in the left breast upper outer quadrant a 2.9 cm irregular enhancing mass and extending posteriorly from this an area of clumped non-masslike enhancement extending approximately 6 cm and leading to a posterior group of nodules which were small but measured in aggregate 2.2 cm. In addition, there was a separate 0.8 cm irregular spiculated enhancing mass in the upper inner aspect of the left breast. In the  lower outer left breast there was a 0.8 cm oval enhancing mass. There were no abnormal appearing lymph nodes.  [name] subsequent history is as detailed below  INTERVAL HISTORY: Jessica [name] returns today for followup of her breast cancer accompanied by her significant other Jessica [name]. Since her last visit here she had an MRI biopsy of 2 additional areas in her left breast. Both proved to be benign. However the biopsy was very traumatic. She had a significant hematoma and quite a bit of pain. In addition she had an echocardiogram which shows a good ejection fraction. She had her port placed, without event. Finally she came to chemotherapy school. She feels that was a worthwhile experience. She is now ready to start her chemotherapy.  REVIEW OF SYSTEMS: Jessica [name] feels a little bit more fatigued than before. She is protecting her port son, and is afraid of raising her arm all the way up, although she can do it. When her blood pressure is taken in the right arm she has a little bit of discomfort, which is unusual for her. She is getting leg cramps. Just a little bit of a runny nose and some heartburn. She also has some back pain. None of these are new problems. She has numbness in her fingers. This is chronic. She feels anxious but not depressed. A detailed review of systems today was otherwise noncontributory.   PAST MEDICAL HISTORY: Past Medical History  Diagnosis Date  . Anxiety   . COPD (chronic obstructive pulmonary disease) 2011    does not use inhaler ofter  . Depression   . Hot flashes   . Family history of anesthesia complication     Had a hard time waking father up  only once after several procedures  . Shortness of breath     with exertion  . Heart murmur     PMH: at age 50; no longer have murmur  . Chronic heartburn   . PVC (premature ventricular contraction)     PMH;at 48 y.o.  . Cancer     DCIS  . Asthma     denies history of asthma    PAST SURGICAL HISTORY: Past Surgical History    Procedure Laterality Date  . Polyp removal     . Tonsillectomy    . Wisdom tooth extraction    . Robotic assisted total hysterectomy with bilateral salpingo oopherectomy Bilateral 05/24/2013    Procedure: ROBOTIC ASSISTED TOTAL HYSTERECTOMY WITH BILATERAL SALPINGECTOMY;  Surgeon: Lavonia Drafts, MD;  Location: [facility];  Service: Gynecology;  Laterality: Bilateral;  . Cystoscopy N/A 05/24/2013    Procedure: CYSTOSCOPY;  Surgeon: Lavonia Drafts, MD;  Location: [facility];  Service: Gynecology;  Laterality: N/A;  . Abdominal hysterectomy    . Breast surgery      left breast biopsy x 2  . Portacath placement Right 01/02/2014    Procedure: INSERTION PORT-A-CATH;  Surgeon: [name], MD;  Location: [facility];  Service: General;  Laterality: Right;    FAMILY HISTORY Family History  Problem Relation Age of Onset  . Heart disease Mother   . Hypercholesterolemia Mother   . Heart disease Father   . COPD Father   . Hypercholesterolemia Father   . Cancer Neg Hx   [name] parents are still living, her father is 73 years old and her mother 2 years old as of August 2015. The patient had no brothers, one sister. There is no history of breast or ovarian cancer in the family.  GYNECOLOGIC HISTORY:  Patient's last menstrual period was 03/18/2013. Menarche age 78. She is GX P0. She took oral contraceptives for many years as well as Depo Provera shots. She is status post hysterectomy with bilateral salpingectomy. 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 She scans at work, and she also does all this she'll vein and stocking. She is single but for the last 11 years as lives with her significant other Jessica [name] Day. He is disabled secondary to multiple orthopedic problems including bilateral avascular necrosis of the hips. Both of them do smoke. The patient is here for drinks on weekends    ADVANCED DIRECTIVES: Not in place    HEALTH  MAINTENANCE: History  Substance Use Topics  . Smoking status: Current Every Day Smoker -- 1.00 packs/day for 16 years    Types: Cigarettes  . Smokeless tobacco: Never Used  . Alcohol Use: Yes     Comment: occasional      Colonoscopy:  PAP: January 2015   Bone density:  Lipid panel:  No Known Allergies  Current Outpatient Prescriptions  Medication Sig Dispense Refill  . budesonide-formoterol (SYMBICORT) 160-4.5 MCG/ACT inhaler Inhale 1 puff into the lungs 2 (two) times daily.       . calcium carbonate (TUMS - DOSED IN MG ELEMENTAL CALCIUM) 500 MG chewable tablet Chew 1 tablet by mouth daily.      Marland Kitchen gabapentin (NEURONTIN) 600 MG tablet Take 600 mg by mouth at bedtime as needed (anxiety).       Marland Kitchen ibuprofen (ADVIL,MOTRIN) 800 MG tablet Take 800 mg by mouth every 8 (eight) hours as needed (pain).      Marland Kitchen ipratropium (ATROVENT HFA) 17  MCG/ACT inhaler Inhale 1 puff into the lungs every 6 (six) hours as needed for wheezing (SOB).       . LORazepam (ATIVAN) 0.5 MG tablet Take 1 tablet (0.5 mg total) by mouth 2 (two) times daily as needed for anxiety.  60 tablet  0  . oxyCODONE-acetaminophen (PERCOCET) 10-325 MG per tablet Take 1 tablet by mouth every 6 (six) hours as needed for pain.  20 tablet  0  . traMADol (ULTRAM) 50 MG tablet Take 50 mg by mouth every 6 (six) hours as needed (pain).       No current facility-administered medications for this visit.    OBJECTIVE: middle-aged white woman who appears stated age 77 Vitals:   01/10/14 1352  BP: 137/78  Pulse: 55  Temp: 99.1 F (37.3 C)  Resp: 18     Body mass index is 32.7 kg/(m^2).    ECOG FS:1 - Symptomatic but completely ambulatory  Sclerae unicteric, pupils equal and reactive Oropharynx clear and moist No cervical or supraclavicular adenopathy Lungs no rales or rhonchi Heart regular rate and rhythm Abd soft, nontender, positive bowel sounds MSK no focal spinal tenderness, no upper extremity lymphedema Neuro: nonfocal,  well oriented, appropriate affect Breasts: The right breast is unremarkable. The left breast is status post recent biopsy. There is a significant ecchymosis across the upper portion of the breast. There is a palpable mass in the lateral aspect of the breast, but it is difficult to differentiate this from a possible hematoma. Left axilla is benign. Skin: The port is in the upper anterior right chest, with no erythema, swelling, or unusual tenderness   LAB RESULTS:  CMP     Component Value Date/Time   NA 139 12/31/2013 1448   NA 137 12/11/2013 1221   K 4.6 12/31/2013 1448   K 3.8 12/11/2013 1221   CL 103 12/31/2013 1448   CO2 24 12/31/2013 1448   CO2 23 12/11/2013 1221   GLUCOSE 93 12/31/2013 1448   GLUCOSE 103 12/11/2013 1221   BUN 9 12/31/2013 1448   BUN 10.8 12/11/2013 1221   CREATININE 0.83 12/31/2013 1448   CREATININE 0.9 12/11/2013 1221   CALCIUM 8.8 12/31/2013 1448   CALCIUM 9.7 12/11/2013 1221   PROT 7.1 12/11/2013 1221   PROT 7.1 06/04/2013 1511   ALBUMIN 4.1 12/11/2013 1221   ALBUMIN 3.4* 06/04/2013 1511   AST 21 12/11/2013 1221   AST 12 06/04/2013 1511   ALT 27 12/11/2013 1221   ALT 14 06/04/2013 1511   ALKPHOS 61 12/11/2013 1221   ALKPHOS 70 06/04/2013 1511   BILITOT 0.54 12/11/2013 1221   BILITOT 0.5 06/04/2013 1511   GFRNONAA 83* 12/31/2013 1448   GFRAA >90 12/31/2013 1448    I No results found for this basename: SPEP,  UPEP,   kappa and lambda light chains    Lab Results  Component Value Date   WBC 7.9 12/31/2013   NEUTROABS 6.1 12/11/2013   HGB 14.6 12/31/2013   HCT 42.4 12/31/2013   MCV 95.3 12/31/2013   PLT 180 12/31/2013      Chemistry      Component Value Date/Time   NA 139 12/31/2013 1448   NA 137 12/11/2013 1221   K 4.6 12/31/2013 1448   K 3.8 12/11/2013 1221   CL 103 12/31/2013 1448   CO2 24 12/31/2013 1448   CO2 23 12/11/2013 1221   BUN 9 12/31/2013 1448   BUN 10.8 12/11/2013 1221   CREATININE 0.83 12/31/2013 1448  CREATININE 0.9 12/11/2013 1221      Component Value  Date/Time   CALCIUM 8.8 12/31/2013 1448   CALCIUM 9.7 12/11/2013 1221   ALKPHOS 61 12/11/2013 1221   ALKPHOS 70 06/04/2013 1511   AST 21 12/11/2013 1221   AST 12 06/04/2013 1511   ALT 27 12/11/2013 1221   ALT 14 06/04/2013 1511   BILITOT 0.54 12/11/2013 1221   BILITOT 0.5 06/04/2013 1511       No results found for this basename: LABCA2    No components found with this basename: LABCA125    No results found for this basename: INR,  in the last 168 hours  Urinalysis    Component Value Date/Time   COLORURINE YELLOW 06/04/2013 1420   APPEARANCEUR HAZY* 06/04/2013 1420   LABSPEC >1.030* 06/04/2013 1420   PHURINE 6.0 06/04/2013 1420   GLUCOSEU NEGATIVE 06/04/2013 1420   HGBUR MODERATE* 06/04/2013 1420   HGBUR negative 10/07/2009 1044   BILIRUBINUR NEGATIVE 06/04/2013 1420   KETONESUR NEGATIVE 06/04/2013 1420   PROTEINUR NEGATIVE 06/04/2013 1420   UROBILINOGEN 0.2 06/04/2013 1420   NITRITE NEGATIVE 06/04/2013 1420   LEUKOCYTESUR NEGATIVE 06/04/2013 1420    STUDIES: Dg Chest 2 View  12/31/2013   CLINICAL DATA:  History of breast cancer, preoperative evaluation for port placement  EXAM: CHEST  2 VIEW  COMPARISON:  06/11/2009  FINDINGS: Cardiac shadow is within normal limits. The lungs are clear bilaterally. No acute bony abnormality is seen.  IMPRESSION: No active cardiopulmonary disease.   Electronically Signed   By: Inez Catalina M.D.   On: 12/31/2013 15:46   US Transvaginal Non-ob  12/12/2013   CLINICAL DATA:  Pelvic pain. Recently diagnosed with breast cancer. History of right ovarian hemorrhagic cyst.  EXAM: TRANSABDOMINAL AND TRANSVAGINAL ULTRASOUND OF PELVIS  TECHNIQUE: Both transabdominal and transvaginal ultrasound examinations of the pelvis were performed. Transabdominal technique was performed for global imaging of the pelvis including uterus, ovaries, adnexal regions, and pelvic cul-de-sac. It was necessary to proceed with endovaginal exam following the transabdominal exam to visualize the  uterus, ovaries, and adnexa.  COMPARISON:  07/03/2013 and CT of 06/04/2013.  FINDINGS: Uterus:  Surgically absent  Right Ovary: 4.1 by 2.4 x 3.0 cm. Resolution of previously described right ovarian hemorrhagic cyst. Small follicles noted within.  Left Ovary:  3.0 x 1.9 x 2.3 cm.  Normal in morphology.  Other Findings:  No significant free fluid.  IMPRESSION: 1. Resolution of previously described right ovarian hemorrhagic cyst. 2. Hysterectomy. 3. No explanation for pelvic pain.   Electronically Signed   By: Abigail Miyamoto M.D.   On: 12/12/2013 15:50   US Pelvis Complete  12/12/2013   CLINICAL DATA:  Pelvic pain. Recently diagnosed with breast cancer. History of right ovarian hemorrhagic cyst.  EXAM: TRANSABDOMINAL AND TRANSVAGINAL ULTRASOUND OF PELVIS  TECHNIQUE: Both transabdominal and transvaginal ultrasound examinations of the pelvis were performed. Transabdominal technique was performed for global imaging of the pelvis including uterus, ovaries, adnexal regions, and pelvic cul-de-sac. It was necessary to proceed with endovaginal exam following the transabdominal exam to visualize the uterus, ovaries, and adnexa.  COMPARISON:  07/03/2013 and CT of 06/04/2013.  FINDINGS: Uterus:  Surgically absent  Right Ovary: 4.1 by 2.4 x 3.0 cm. Resolution of previously described right ovarian hemorrhagic cyst. Small follicles noted within.  Left Ovary:  3.0 x 1.9 x 2.3 cm.  Normal in morphology.  Other Findings:  No significant free fluid.  IMPRESSION: 1. Resolution of previously described right ovarian hemorrhagic cyst.  2. Hysterectomy. 3. No explanation for pelvic pain.   Electronically Signed   By: Abigail Miyamoto M.D.   On: 12/12/2013 15:50   Dg Chest Port 1 View  01/02/2014   CLINICAL DATA:  Port placement.  EXAM: PORTABLE CHEST - 1 VIEW  COMPARISON:  12/31/2013  FINDINGS: There has been interval placement of a right subclavian Port-A-Cath with tip overlying the lower SVC. Cardiac silhouette is likely within normal  limits for size, mildly accentuated by AP technique and slightly shallow lung inflation. No airspace consolidation, edema, pleural effusion, or pneumothorax is identified. No acute osseous abnormality is identified.  IMPRESSION: Right subclavian Port-A-Cath as above.   Electronically Signed   By: Logan Bores   On: 01/02/2014 11:28   Dg Fluoro Guide Cv Line-no Report  01/02/2014   CLINICAL DATA: port placement, left breast cancer   FLOURO GUIDE CV LINE  Fluoroscopy was utilized by the requesting physician.  No radiographic  interpretation.    Mr Aundra Millet Breast Bx Johnella Moloney Dev 1st Lesion Image Bx Spec Mr Guide  12/18/2013   ADDENDUM REPORT: 12/18/2013 11:49  ADDENDUM: Pathology revealed a tubular adenoma with calcifications in the left breast at 5:00 and 11:00. This was found to be concordant by Dr. Luberta Robertson. Pathology was discussed with the patient by telephone. She reported mild additional bleeding, but no additional growth of the hematoma she had post procedure. She stated that her breast was beginning to bruise and was moderately painful. Post biopsy instructions and care were reviewed. She was scheduled to return today for post clip films, but this has been delayed until Friday, December 20, 2013. I will follow up with her on that day to see if this is feasible. She has recently been diagnosed with left breast invasive ductal carcinoma. She is to follow her treatment plan.  Pathology results reported by Susa Raring RN, BSN on December 18, 2013.   Electronically Signed   By: Luberta Robertson M.D.   On: 12/18/2013 11:49   12/18/2013   CLINICAL DATA:  Known left breast invasive mammary carcinoma. Abnormal breast MRI.  EXAM: MRI GUIDED CORE NEEDLE BIOPSY OF THE left BREAST  TECHNIQUE: Multiplanar, multisequence MR imaging of the left breast was performed both before and after administration of intravenous contrast.  CONTRAST:  20m MULTIHANCE GADOBENATE DIMEGLUMINE 529 MG/ML IV SOLN  COMPARISON:  Previous exams.   FINDINGS: I met with the patient, and we discussed the procedure of MRI guided biopsy, including risks, benefits, and alternatives. Specifically, we discussed the risks of infection, bleeding, tissue injury, clip migration, and inadequate sampling. Informed, written consent was given. The usual time out protocol was performed immediately prior to the procedure.  Using sterile technique, 2% Lidocaine, MRI guidance, and a 9 gauge vacuum assisted device, biopsy was performed of the enhancing mass located within the lower outer quadrant of the left breast at the 5 o'clock position using a lateral approach. At the conclusion of the procedure, a cylinder-shaped tissue marker clip was deployed into the biopsy cavity. Follow-up 2-view mammogram will be performed and dictated separately.  IMPRESSION: MRI guided biopsy of the left breast mass located within the lower outer quadrant at 5 o'clock position. No apparent complications.  Electronically Signed: By: RLuberta RobertsonM.D. On: 12/17/2013 11:03   Mr LAundra MilletBreast Bx W Loc Dev Ea Add Lesion Image Bx Spec Mr Guide  12/18/2013   ADDENDUM REPORT: 12/18/2013 11:50  ADDENDUM: Pathology revealed a tubular adenoma with calcifications in the left breast  at 5:00 and 11:00. This was found to be concordant by Dr. Luberta Robertson. Pathology was discussed with the patient by telephone. She reported mild additional bleeding, but no additional growth of the hematoma she had post procedure. She stated that her breast was beginning to bruise and was moderately painful. Post biopsy instructions and care were reviewed. She was scheduled to return today for post clip films, but this has been delayed until Friday, December 20, 2013. I will follow up with her on that day to see if this is feasible. She has recently been diagnosed with left breast invasive ductal carcinoma. She is to follow her treatment plan.  Pathology results reported by Susa Raring RN, BSN on December 18, 2013.   Electronically  Signed   By: Luberta Robertson M.D.   On: 12/18/2013 11:50   12/18/2013   CLINICAL DATA:  Known left breast invasive mammary carcinoma. Abnormal breast MRI.  EXAM: MRI GUIDED CORE NEEDLE BIOPSY OF THE left BREAST  TECHNIQUE: Multiplanar, multisequence MR imaging of the left breast was performed both before and after administration of intravenous contrast.  CONTRAST:  18 cc of MultiHance  COMPARISON:  Previous exams.  FINDINGS: I met with the patient, and we discussed the procedure of MRI guided biopsy, including risks, benefits, and alternatives. Specifically, we discussed the risks of infection, bleeding, tissue injury, clip migration, and inadequate sampling. Informed, written consent was given. The usual time out protocol was performed immediately prior to the procedure.  Using sterile technique, 2% Lidocaine, MRI guidance, and a 9 gauge vacuum assisted device, biopsy was performed of the mass located within the upper inner quadrant of the left breast at the 11 o'clock position using a lateral approach. At the conclusion of the procedure, a dumbbell-shaped tissue marker clip was deployed into the biopsy cavity. The patient developed a moderate hematoma following the biopsy procedure. Follow-up 2-view mammogram will be performed and dictated separately.  IMPRESSION: MRI guided biopsy of the left breast mass located within the upper inner quadrant at the 11 o'clock position. Moderate hematoma post biopsy procedure.  Electronically Signed: By: Luberta Robertson M.D. On: 12/17/2013 11:07    ASSESSMENT: 48 y.o. BRCA negative [place] woman status post left breast upper outer quadrant biopsy 11/29/2013 for a clinical T2 N0, stage IIA invasive ductal carcinoma, grade 1, triple positive, with an MIB-1 of 46%.  (1) MRI-guided biopsy of two additional areas in the left breast showed only tubular adenomas, no malignancy.   (2) neoadjuvant chemotherapy to start 01/14/2014, consisting of carboplatin, docetaxel, trastuzumab  and pertuzumab given every 21 days for 6 cycles, with Neulasta on day 2  (3) trastuzumab to be continued to the total one year, through September of 2016  (4) definitive surgery to follow chemotherapy  (5) radiation to follow surgery  (6) antiestrogen therapy to follow radiation  PLAN: We spent 55 minutes today going over the chemotherapy side effects, dates and plans. I gave Jessica [name] a "roadmap" on how to take her supportive medications, and we also discussed the possible toxicities, side effects and complications of these agents. I placed all her prescriptions and I also made appointments for her treatments and visit here. She is going to be treated on Tuesdays and she will take off work the rest of that week, then working 2 full weeks afterwards.  She will see [place] on day 1 and day 8 of each cycle. Once she completes 3 cycles we will decide whether to do a midcourse MRI of the breast,  to document initial response.  I encouraged lead to start an persist on a moderate exercise program. 30 minutes of walking daily would be good. She understands the need to keep her hands washed, avoid sick people, and call for temperature above 100, but also low she knows she can go shopping, go to church, or go to a restaurant if she wishes. She does need to make sure to wash her hands as directed and call for any fever.  Charmika has a good understanding of the overall plan. She agrees with it. She knows the goal of treatment in her case is cure. She will call with any problems that may develop before her next visit here.  [name], MD   01/10/2014 2:08 PM

## 2014-01-10 NOTE — Telephone Encounter (Signed)
Per staff message and POF I have scheduled appts. Advised scheduler of appts. JMW  

## 2014-01-10 NOTE — Telephone Encounter (Signed)
, °

## 2014-01-10 NOTE — Progress Notes (Signed)
RECEIVED A FAX FROM [facility] CONCERNING A PRIOR AUTHORIZATION FOR TOBRAMYCIN-DEXAMETHASONE. THIS REQUEST WAS PLACED IN THE MANAGED CARE BIN.

## 2014-01-13 ENCOUNTER — Encounter: Payer: Self-pay | Admitting: Oncology

## 2014-01-13 ENCOUNTER — Other Ambulatory Visit: Payer: Self-pay | Admitting: *Deleted

## 2014-01-13 DIAGNOSIS — C50412 Malignant neoplasm of upper-outer quadrant of left female breast: Secondary | ICD-10-CM

## 2014-01-13 NOTE — Progress Notes (Signed)
Per [facility] needs to run the brand tobramycin- dexamethasone instead of generic, informed the pharmacy

## 2014-01-14 ENCOUNTER — Other Ambulatory Visit (HOSPITAL_BASED_OUTPATIENT_CLINIC_OR_DEPARTMENT_OTHER): Payer: Medicaid Other

## 2014-01-14 ENCOUNTER — Ambulatory Visit (HOSPITAL_BASED_OUTPATIENT_CLINIC_OR_DEPARTMENT_OTHER): Payer: Medicaid Other

## 2014-01-14 VITALS — BP 134/67 | HR 57 | Temp 98.3°F | Resp 18

## 2014-01-14 DIAGNOSIS — C50412 Malignant neoplasm of upper-outer quadrant of left female breast: Secondary | ICD-10-CM

## 2014-01-14 DIAGNOSIS — C50419 Malignant neoplasm of upper-outer quadrant of unspecified female breast: Secondary | ICD-10-CM

## 2014-01-14 DIAGNOSIS — Z5111 Encounter for antineoplastic chemotherapy: Secondary | ICD-10-CM

## 2014-01-14 DIAGNOSIS — Z5112 Encounter for antineoplastic immunotherapy: Secondary | ICD-10-CM

## 2014-01-14 LAB — COMPREHENSIVE METABOLIC PANEL (CC13)
ALT: 14 U/L (ref 0–55)
AST: 11 U/L (ref 5–34)
Albumin: 4 g/dL (ref 3.5–5.0)
Alkaline Phosphatase: 64 U/L (ref 40–150)
Anion Gap: 7 mEq/L (ref 3–11)
BUN: 11.7 mg/dL (ref 7.0–26.0)
CHLORIDE: 107 meq/L (ref 98–109)
CO2: 23 meq/L (ref 22–29)
Calcium: 10.3 mg/dL (ref 8.4–10.4)
Creatinine: 0.9 mg/dL (ref 0.6–1.1)
Glucose: 166 mg/dl — ABNORMAL HIGH (ref 70–140)
Potassium: 4.7 mEq/L (ref 3.5–5.1)
Sodium: 137 mEq/L (ref 136–145)
TOTAL PROTEIN: 7.6 g/dL (ref 6.4–8.3)
Total Bilirubin: 0.46 mg/dL (ref 0.20–1.20)

## 2014-01-14 LAB — CBC WITH DIFFERENTIAL/PLATELET
BASO%: 0.2 % (ref 0.0–2.0)
Basophils Absolute: 0 10*3/uL (ref 0.0–0.1)
EOS%: 0 % (ref 0.0–7.0)
Eosinophils Absolute: 0 10*3/uL (ref 0.0–0.5)
HCT: 46.4 % (ref 34.8–46.6)
HGB: 15.6 g/dL (ref 11.6–15.9)
LYMPH#: 0.5 10*3/uL — AB (ref 0.9–3.3)
LYMPH%: 3.4 % — AB (ref 14.0–49.7)
MCH: 32.3 pg (ref 25.1–34.0)
MCHC: 33.5 g/dL (ref 31.5–36.0)
MCV: 96.4 fL (ref 79.5–101.0)
MONO#: 0.1 10*3/uL (ref 0.1–0.9)
MONO%: 0.6 % (ref 0.0–14.0)
NEUT#: 14.8 10*3/uL — ABNORMAL HIGH (ref 1.5–6.5)
NEUT%: 95.8 % — ABNORMAL HIGH (ref 38.4–76.8)
Platelets: 172 10*3/uL (ref 145–400)
RBC: 4.81 10*6/uL (ref 3.70–5.45)
RDW: 13.1 % (ref 11.2–14.5)
WBC: 15.4 10*3/uL — AB (ref 3.9–10.3)

## 2014-01-14 MED ORDER — HEPARIN SOD (PORK) LOCK FLUSH 100 UNIT/ML IV SOLN
500.0000 [IU] | Freq: Once | INTRAVENOUS | Status: AC | PRN
  Administered 2014-01-14: 500 [IU]
  Filled 2014-01-14: qty 5

## 2014-01-14 MED ORDER — ONDANSETRON 16 MG/50ML IVPB (CHCC)
16.0000 mg | Freq: Once | INTRAVENOUS | Status: AC
  Administered 2014-01-14: 16 mg via INTRAVENOUS

## 2014-01-14 MED ORDER — SODIUM CHLORIDE 0.9 % IV SOLN
Freq: Once | INTRAVENOUS | Status: AC
  Administered 2014-01-14: 09:00:00 via INTRAVENOUS

## 2014-01-14 MED ORDER — DEXAMETHASONE SODIUM PHOSPHATE 20 MG/5ML IJ SOLN
INTRAMUSCULAR | Status: AC
  Filled 2014-01-14: qty 5

## 2014-01-14 MED ORDER — DEXAMETHASONE SODIUM PHOSPHATE 20 MG/5ML IJ SOLN
20.0000 mg | Freq: Once | INTRAMUSCULAR | Status: AC
  Administered 2014-01-14: 20 mg via INTRAVENOUS

## 2014-01-14 MED ORDER — DIPHENHYDRAMINE HCL 25 MG PO CAPS
25.0000 mg | ORAL_CAPSULE | Freq: Once | ORAL | Status: AC
  Administered 2014-01-14: 25 mg via ORAL

## 2014-01-14 MED ORDER — ACETAMINOPHEN 325 MG PO TABS
650.0000 mg | ORAL_TABLET | Freq: Once | ORAL | Status: AC
  Administered 2014-01-14: 650 mg via ORAL

## 2014-01-14 MED ORDER — SODIUM CHLORIDE 0.9 % IV SOLN
720.0000 mg | Freq: Once | INTRAVENOUS | Status: AC
  Administered 2014-01-14: 720 mg via INTRAVENOUS
  Filled 2014-01-14: qty 72

## 2014-01-14 MED ORDER — DOCETAXEL CHEMO INJECTION 160 MG/16ML
75.0000 mg/m2 | Freq: Once | INTRAVENOUS | Status: AC
  Administered 2014-01-14: 150 mg via INTRAVENOUS
  Filled 2014-01-14: qty 15

## 2014-01-14 MED ORDER — TRASTUZUMAB CHEMO INJECTION 440 MG
8.0000 mg/kg | Freq: Once | INTRAVENOUS | Status: AC
  Administered 2014-01-14: 714 mg via INTRAVENOUS
  Filled 2014-01-14: qty 34

## 2014-01-14 MED ORDER — ONDANSETRON 16 MG/50ML IVPB (CHCC)
INTRAVENOUS | Status: AC
  Filled 2014-01-14: qty 16

## 2014-01-14 MED ORDER — SODIUM CHLORIDE 0.9 % IJ SOLN
10.0000 mL | INTRAMUSCULAR | Status: DC | PRN
  Administered 2014-01-14: 10 mL
  Filled 2014-01-14: qty 10

## 2014-01-14 MED ORDER — SODIUM CHLORIDE 0.9 % IV SOLN
840.0000 mg | Freq: Once | INTRAVENOUS | Status: AC
  Administered 2014-01-14: 840 mg via INTRAVENOUS
  Filled 2014-01-14: qty 28

## 2014-01-14 MED ORDER — DIPHENHYDRAMINE HCL 25 MG PO CAPS
ORAL_CAPSULE | ORAL | Status: AC
  Filled 2014-01-14: qty 1

## 2014-01-14 MED ORDER — ACETAMINOPHEN 325 MG PO TABS
ORAL_TABLET | ORAL | Status: AC
  Filled 2014-01-14: qty 2

## 2014-01-14 NOTE — Patient Instructions (Addendum)
[facility] Discharge Instructions for Patients Receiving Chemotherapy  Today you received the following chemotherapy agents Taxotere/Carboplatin/Herceptin/Perjeta.  To help prevent nausea and vomiting after your treatment, we encourage you to take your nausea medication as prescribed.   If you develop nausea and vomiting that is not controlled by your nausea medication, call the clinic.   BELOW ARE SYMPTOMS THAT SHOULD BE REPORTED IMMEDIATELY:  *FEVER GREATER THAN 100.5 F  *CHILLS WITH OR WITHOUT FEVER  NAUSEA AND VOMITING THAT IS NOT CONTROLLED WITH YOUR NAUSEA MEDICATION  *UNUSUAL SHORTNESS OF BREATH  *UNUSUAL BRUISING OR BLEEDING  TENDERNESS IN MOUTH AND THROAT WITH OR WITHOUT PRESENCE OF ULCERS  *URINARY PROBLEMS  *BOWEL PROBLEMS  UNUSUAL RASH Items with * indicate a potential emergency and should be followed up as soon as possible.  Feel free to call the clinic you have any questions or concerns. The clinic phone number is [phone].  Trastuzumab injection for infusion (Herceptin) What is this medicine? TRASTUZUMAB (tras TOO zoo mab) is a monoclonal antibody. It targets a protein called HER2. This protein is found in some stomach and breast cancers. This medicine can stop cancer cell growth. This medicine may be used with other cancer treatments. This medicine may be used for other purposes; ask your health care provider or pharmacist if you have questions. COMMON BRAND NAME(S): Herceptin What should I tell my health care provider before I take this medicine? They need to know if you have any of these conditions: -heart disease -heart failure -infection (especially a virus infection such as chickenpox, cold sores, or herpes) -lung or breathing disease, like asthma -recent or ongoing radiation therapy -an unusual or allergic reaction to trastuzumab, benzyl alcohol, or other medications, foods, dyes, or preservatives -pregnant or trying to get  pregnant -breast-feeding How should I use this medicine? This drug is given as an infusion into a vein. It is administered in a hospital or clinic by a specially trained health care professional. Talk to your pediatrician regarding the use of this medicine in children. This medicine is not approved for use in children. Overdosage: If you think you have taken too much of this medicine contact a poison control center or emergency room at once. NOTE: This medicine is only for you. Do not share this medicine with others. What if I miss a dose? It is important not to miss a dose. Call your doctor or health care professional if you are unable to keep an appointment. What may interact with this medicine? -cyclophosphamide -doxorubicin -warfarin This list may not describe all possible interactions. Give your health care provider a list of all the medicines, herbs, non-prescription drugs, or dietary supplements you use. Also tell them if you smoke, drink alcohol, or use illegal drugs. Some items may interact with your medicine. What should I watch for while using this medicine? Visit your doctor for checks on your progress. Report any side effects. Continue your course of treatment even though you feel ill unless your doctor tells you to stop. Call your doctor or health care professional for advice if you get a fever, chills or sore throat, or other symptoms of a cold or flu. Do not treat yourself. Try to avoid being around people who are sick. You may experience fever, chills and shaking during your first infusion. These effects are usually mild and can be treated with other medicines. Report any side effects during the infusion to your health care professional. Fever and chills usually do not happen with  later infusions. What side effects may I notice from receiving this medicine? Side effects that you should report to your doctor or other health care professional as soon as possible: -breathing  difficulties -chest pain or palpitations -cough -dizziness or fainting -fever or chills, sore throat -skin rash, itching or hives -swelling of the legs or ankles -unusually weak or tired Side effects that usually do not require medical attention (report to your doctor or other health care professional if they continue or are bothersome): -loss of appetite -headache -muscle aches -nausea This list may not describe all possible side effects. Call your doctor for medical advice about side effects. You may report side effects to FDA at 1-800-FDA-1088. Where should I keep my medicine? This drug is given in a hospital or clinic and will not be stored at home. NOTE: This sheet is a summary. It may not cover all possible information. If you have questions about this medicine, talk to your doctor, pharmacist, or health care provider.  2015, [employer]. (2009-02-06 13:43:15)  Pertuzumab injection (Perjeta) What is this medicine? PERTUZUMAB (per TOOZ ue mab) is a monoclonal antibody that targets a protein called HER2. HER2 is found in some breast cancers. This medicine can stop cancer cell growth. This medicine is used with other cancer treatments. This medicine may be used for other purposes; ask your health care provider or pharmacist if you have questions. COMMON BRAND NAME(S): PERJETA What should I tell my health care provider before I take this medicine? They need to know if you have any of these conditions: -heart disease -heart failure -high blood pressure -history of irregular heart beat -recent or ongoing radiation therapy -an unusual or allergic reaction to pertuzumab, other medicines, foods, dyes, or preservatives -pregnant or trying to get pregnant -breast-feeding How should I use this medicine? This medicine is for infusion into a vein. It is given by a health care professional in a hospital or clinic setting. Talk to your pediatrician regarding the use of this  medicine in children. Special care may be needed. Overdosage: If you think you've taken too much of this medicine contact a poison control center or emergency room at once. Overdosage: If you think you have taken too much of this medicine contact a poison control center or emergency room at once. NOTE: This medicine is only for you. Do not share this medicine with others. What if I miss a dose? It is important not to miss your dose. Call your doctor or health care professional if you are unable to keep an appointment. What may interact with this medicine? Interactions are not expected. Give your health care provider a list of all the medicines, herbs, non-prescription drugs, or dietary supplements you use. Also tell them if you smoke, drink alcohol, or use illegal drugs. Some items may interact with your medicine. This list may not describe all possible interactions. Give your health care provider a list of all the medicines, herbs, non-prescription drugs, or dietary supplements you use. Also tell them if you smoke, drink alcohol, or use illegal drugs. Some items may interact with your medicine. What should I watch for while using this medicine? Your condition will be monitored carefully while you are receiving this medicine. Report any side effects. Continue your course of treatment even though you feel ill unless your doctor tells you to stop. Do not become pregnant while taking this medicine. Women should inform their doctor if they wish to become pregnant or think they might be pregnant. There is  a potential for serious side effects to an unborn child. Talk to your health care professional or pharmacist for more information. Do not breast-feed an infant while taking this medicine. Call your doctor or health care professional for advice if you get a fever, chills or sore throat, or other symptoms of a cold or flu. Do not treat yourself. Try to avoid being around people who are sick. You may  experience fever, chills, and headache during the infusion. Report any side effects during the infusion to your health care professional. What side effects may I notice from receiving this medicine? Side effects that you should report to your doctor or health care professional as soon as possible: -breathing problems -chest pain or palpitations -dizziness -feeling faint or lightheaded -fever or chills -skin rash, itching or hives -sore throat -swelling of the face, lips, or tongue -swelling of the legs or ankles -unusually weak or tired Side effects that usually do not require medical attention (Report these to your doctor or health care professional if they continue or are bothersome.): -diarrhea -hair loss -nausea, vomiting -tiredness This list may not describe all possible side effects. Call your doctor for medical advice about side effects. You may report side effects to FDA at 1-800-FDA-1088. Where should I keep my medicine? This drug is given in a hospital or clinic and will not be stored at home. NOTE: This sheet is a summary. It may not cover all possible information. If you have questions about this medicine, talk to your doctor, pharmacist, or health care provider.  2015, [employer] Standard. (2012-02-01 16:54:15)  Docetaxel injection (Taxotere) What is this medicine? DOCETAXEL (doe se TAX el) is a chemotherapy drug. It targets fast dividing cells, like cancer cells, and causes these cells to die. This medicine is used to treat many types of cancers like breast cancer, certain stomach cancers, head and neck cancer, lung cancer, and prostate cancer. This medicine may be used for other purposes; ask your health care provider or pharmacist if you have questions. COMMON BRAND NAME(S): Docefrez, Taxotere What should I tell my health care provider before I take this medicine? They need to know if you have any of these conditions: -infection (especially a virus infection such as  chickenpox, cold sores, or herpes) -liver disease -low blood counts, like low white cell, platelet, or red cell counts -an unusual or allergic reaction to docetaxel, polysorbate 80, other chemotherapy agents, other medicines, foods, dyes, or preservatives -pregnant or trying to get pregnant -breast-feeding How should I use this medicine? This drug is given as an infusion into a vein. It is administered in a hospital or clinic by a specially trained health care professional. Talk to your pediatrician regarding the use of this medicine in children. Special care may be needed. Overdosage: If you think you have taken too much of this medicine contact a poison control center or emergency room at once. NOTE: This medicine is only for you. Do not share this medicine with others. What if I miss a dose? It is important not to miss your dose. Call your doctor or health care professional if you are unable to keep an appointment. What may interact with this medicine? -cyclosporine -erythromycin -ketoconazole -medicines to increase blood counts like filgrastim, pegfilgrastim, sargramostim -vaccines Talk to your doctor or health care professional before taking any of these medicines: -acetaminophen -aspirin -ibuprofen -ketoprofen -naproxen This list may not describe all possible interactions. Give your health care provider a list of all the medicines, herbs, non-prescription drugs,  or dietary supplements you use. Also tell them if you smoke, drink alcohol, or use illegal drugs. Some items may interact with your medicine. What should I watch for while using this medicine? Your condition will be monitored carefully while you are receiving this medicine. You will need important blood work done while you are taking this medicine. This drug may make you feel generally unwell. This is not uncommon, as chemotherapy can affect healthy cells as well as cancer cells. Report any side effects. Continue your course  of treatment even though you feel ill unless your doctor tells you to stop. In some cases, you may be given additional medicines to help with side effects. Follow all directions for their use. Call your doctor or health care professional for advice if you get a fever, chills or sore throat, or other symptoms of a cold or flu. Do not treat yourself. This drug decreases your body's ability to fight infections. Try to avoid being around people who are sick. This medicine may increase your risk to bruise or bleed. Call your doctor or health care professional if you notice any unusual bleeding. Be careful brushing and flossing your teeth or using a toothpick because you may get an infection or bleed more easily. If you have any dental work done, tell your dentist you are receiving this medicine. Avoid taking products that contain aspirin, acetaminophen, ibuprofen, naproxen, or ketoprofen unless instructed by your doctor. These medicines may hide a fever. This medicine contains an alcohol in the product. You may get drowsy or dizzy. Do not drive, use machinery, or do anything that needs mental alertness until you know how this medicine affects you. Do not stand or sit up quickly, especially if you are an older patient. This reduces the risk of dizzy or fainting spells. Avoid alcoholic drinks Do not become pregnant while taking this medicine. Women should inform their doctor if they wish to become pregnant or think they might be pregnant. There is a potential for serious side effects to an unborn child. Talk to your health care professional or pharmacist for more information. Do not breast-feed an infant while taking this medicine. What side effects may I notice from receiving this medicine? Side effects that you should report to your doctor or health care professional as soon as possible: -allergic reactions like skin rash, itching or hives, swelling of the face, lips, or tongue -low blood counts - This drug  may decrease the number of white blood cells, red blood cells and platelets. You may be at increased risk for infections and bleeding. -signs of infection - fever or chills, cough, sore throat, pain or difficulty passing urine -signs of decreased platelets or bleeding - bruising, pinpoint red spots on the skin, black, tarry stools, nosebleeds -signs of decreased red blood cells - unusually weak or tired, fainting spells, lightheadedness -breathing problems -fast or irregular heartbeat -low blood pressure -mouth sores -nausea and vomiting -pain, swelling, redness or irritation at the injection site -pain, tingling, numbness in the hands or feet -swelling of the ankle, feet, hands -weight gain Side effects that usually do not require medical attention (report to your prescriber or health care professional if they continue or are bothersome): -bone pain -complete hair loss including hair on your head, underarms, pubic hair, eyebrows, and eyelashes -diarrhea -excessive tearing -changes in the color of fingernails -loosening of the fingernails -nausea -muscle pain -red flush to skin -sweating -weak or tired This list may not describe all possible side effects. Call  your doctor for medical advice about side effects. You may report side effects to FDA at 1-800-FDA-1088. Where should I keep my medicine? This drug is given in a hospital or clinic and will not be stored at home. NOTE: This sheet is a summary. It may not cover all possible information. If you have questions about this medicine, talk to your doctor, pharmacist, or health care provider.  2015, [employer] Standard. (2013-02-28 22:21:02)  Carboplatin injection What is this medicine? CARBOPLATIN (KAR boe pla tin) is a chemotherapy drug. It targets fast dividing cells, like cancer cells, and causes these cells to die. This medicine is used to treat ovarian cancer and many other cancers. This medicine may be used for other  purposes; ask your health care provider or pharmacist if you have questions. COMMON BRAND NAME(S): Paraplatin What should I tell my health care provider before I take this medicine? They need to know if you have any of these conditions: -blood disorders -hearing problems -kidney disease -recent or ongoing radiation therapy -an unusual or allergic reaction to carboplatin, cisplatin, other chemotherapy, other medicines, foods, dyes, or preservatives -pregnant or trying to get pregnant -breast-feeding How should I use this medicine? This drug is usually given as an infusion into a vein. It is administered in a hospital or clinic by a specially trained health care professional. Talk to your pediatrician regarding the use of this medicine in children. Special care may be needed. Overdosage: If you think you have taken too much of this medicine contact a poison control center or emergency room at once. NOTE: This medicine is only for you. Do not share this medicine with others. What if I miss a dose? It is important not to miss a dose. Call your doctor or health care professional if you are unable to keep an appointment. What may interact with this medicine? -medicines for seizures -medicines to increase blood counts like filgrastim, pegfilgrastim, sargramostim -some antibiotics like amikacin, gentamicin, neomycin, streptomycin, tobramycin -vaccines Talk to your doctor or health care professional before taking any of these medicines: -acetaminophen -aspirin -ibuprofen -ketoprofen -naproxen This list may not describe all possible interactions. Give your health care provider a list of all the medicines, herbs, non-prescription drugs, or dietary supplements you use. Also tell them if you smoke, drink alcohol, or use illegal drugs. Some items may interact with your medicine. What should I watch for while using this medicine? Your condition will be monitored carefully while you are receiving this  medicine. You will need important blood work done while you are taking this medicine. This drug may make you feel generally unwell. This is not uncommon, as chemotherapy can affect healthy cells as well as cancer cells. Report any side effects. Continue your course of treatment even though you feel ill unless your doctor tells you to stop. In some cases, you may be given additional medicines to help with side effects. Follow all directions for their use. Call your doctor or health care professional for advice if you get a fever, chills or sore throat, or other symptoms of a cold or flu. Do not treat yourself. This drug decreases your body's ability to fight infections. Try to avoid being around people who are sick. This medicine may increase your risk to bruise or bleed. Call your doctor or health care professional if you notice any unusual bleeding. Be careful brushing and flossing your teeth or using a toothpick because you may get an infection or bleed more easily. If you have any dental  work done, tell your dentist you are receiving this medicine. Avoid taking products that contain aspirin, acetaminophen, ibuprofen, naproxen, or ketoprofen unless instructed by your doctor. These medicines may hide a fever. Do not become pregnant while taking this medicine. Women should inform their doctor if they wish to become pregnant or think they might be pregnant. There is a potential for serious side effects to an unborn child. Talk to your health care professional or pharmacist for more information. Do not breast-feed an infant while taking this medicine. What side effects may I notice from receiving this medicine? Side effects that you should report to your doctor or health care professional as soon as possible: -allergic reactions like skin rash, itching or hives, swelling of the face, lips, or tongue -signs of infection - fever or chills, cough, sore throat, pain or difficulty passing urine -signs of  decreased platelets or bleeding - bruising, pinpoint red spots on the skin, black, tarry stools, nosebleeds -signs of decreased red blood cells - unusually weak or tired, fainting spells, lightheadedness -breathing problems -changes in hearing -changes in vision -chest pain -high blood pressure -low blood counts - This drug may decrease the number of white blood cells, red blood cells and platelets. You may be at increased risk for infections and bleeding. -nausea and vomiting -pain, swelling, redness or irritation at the injection site -pain, tingling, numbness in the hands or feet -problems with balance, talking, walking -trouble passing urine or change in the amount of urine Side effects that usually do not require medical attention (report to your doctor or health care professional if they continue or are bothersome): -hair loss -loss of appetite -metallic taste in the mouth or changes in taste This list may not describe all possible side effects. Call your doctor for medical advice about side effects. You may report side effects to FDA at 1-800-FDA-1088. Where should I keep my medicine? This drug is given in a hospital or clinic and will not be stored at home. NOTE: This sheet is a summary. It may not cover all possible information. If you have questions about this medicine, talk to your doctor, pharmacist, or health care provider.  2015, [employer] Standard. (2007-07-10 14:38:05)

## 2014-01-15 ENCOUNTER — Encounter: Payer: Self-pay | Admitting: Oncology

## 2014-01-15 ENCOUNTER — Telehealth: Payer: Self-pay | Admitting: *Deleted

## 2014-01-15 ENCOUNTER — Other Ambulatory Visit: Payer: Self-pay | Admitting: Emergency Medicine

## 2014-01-15 ENCOUNTER — Ambulatory Visit (HOSPITAL_BASED_OUTPATIENT_CLINIC_OR_DEPARTMENT_OTHER): Payer: Medicaid Other

## 2014-01-15 DIAGNOSIS — C50412 Malignant neoplasm of upper-outer quadrant of left female breast: Secondary | ICD-10-CM

## 2014-01-15 DIAGNOSIS — C50419 Malignant neoplasm of upper-outer quadrant of unspecified female breast: Secondary | ICD-10-CM

## 2014-01-15 DIAGNOSIS — C50919 Malignant neoplasm of unspecified site of unspecified female breast: Secondary | ICD-10-CM

## 2014-01-15 DIAGNOSIS — Z5189 Encounter for other specified aftercare: Secondary | ICD-10-CM

## 2014-01-15 MED ORDER — LORATADINE 10 MG PO TABS: 10.0000 mg | ORAL_TABLET | ORAL | Status: DC | PRN

## 2014-01-15 MED ORDER — LORATADINE 10 MG PO TABS: 10.0000 mg | ORAL_TABLET | Freq: Every day | ORAL | Status: DC | PRN

## 2014-01-15 MED ORDER — PEGFILGRASTIM INJECTION 6 MG/0.6ML
6.0000 mg | Freq: Once | SUBCUTANEOUS | Status: AC
  Administered 2014-01-15: 6 mg via SUBCUTANEOUS
  Filled 2014-01-15: qty 0.6

## 2014-01-15 NOTE — Progress Notes (Signed)
Called and left a message to call me back to let me know how often she gets paid-- weekly or biweekly.

## 2014-01-15 NOTE — Patient Instructions (Signed)
Pegfilgrastim injection What is this medicine? PEGFILGRASTIM (peg fil GRA stim) is a long-acting granulocyte colony-stimulating factor that stimulates the growth of neutrophils, a type of white blood cell important in the body's fight against infection. It is used to reduce the incidence of fever and infection in patients with certain types of cancer who are receiving chemotherapy that affects the bone marrow. This medicine may be used for other purposes; ask your health care provider or pharmacist if you have questions. COMMON BRAND NAME(S): Neulasta What should I tell my health care provider before I take this medicine? They need to know if you have any of these conditions: -latex allergy -ongoing radiation therapy -sickle cell disease -skin reactions to acrylic adhesives (On-Body Injector only) -an unusual or allergic reaction to pegfilgrastim, filgrastim, other medicines, foods, dyes, or preservatives -pregnant or trying to get pregnant -breast-feeding How should I use this medicine? This medicine is for injection under the skin. If you get this medicine at home, you will be taught how to prepare and give the pre-filled syringe or how to use the On-body Injector. Refer to the patient Instructions for Use for detailed instructions. Use exactly as directed. Take your medicine at regular intervals. Do not take your medicine more often than directed. It is important that you put your used needles and syringes in a special sharps container. Do not put them in a trash can. If you do not have a sharps container, call your pharmacist or healthcare provider to get one. Talk to your pediatrician regarding the use of this medicine in children. Special care may be needed. Overdosage: If you think you have taken too much of this medicine contact a poison control center or emergency room at once. NOTE: This medicine is only for you. Do not share this medicine with others. What if I miss a dose? It is  important not to miss your dose. Call your doctor or health care professional if you miss your dose. If you miss a dose due to an On-body Injector failure or leakage, a new dose should be administered as soon as possible using a single prefilled syringe for manual use. What may interact with this medicine? Interactions have not been studied. Give your health care provider a list of all the medicines, herbs, non-prescription drugs, or dietary supplements you use. Also tell them if you smoke, drink alcohol, or use illegal drugs. Some items may interact with your medicine. This list may not describe all possible interactions. Give your health care provider a list of all the medicines, herbs, non-prescription drugs, or dietary supplements you use. Also tell them if you smoke, drink alcohol, or use illegal drugs. Some items may interact with your medicine. What should I watch for while using this medicine? You may need blood work done while you are taking this medicine. If you are going to need a MRI, CT scan, or other procedure, tell your doctor that you are using this medicine (On-Body Injector only). What side effects may I notice from receiving this medicine? Side effects that you should report to your doctor or health care professional as soon as possible: -allergic reactions like skin rash, itching or hives, swelling of the face, lips, or tongue -dizziness -fever -pain, redness, or irritation at site where injected -pinpoint red spots on the skin -shortness of breath or breathing problems -stomach or side pain, or pain at the shoulder -swelling -tiredness -trouble passing urine Side effects that usually do not require medical attention (report to your doctor   or health care professional if they continue or are bothersome): -bone pain -muscle pain This list may not describe all possible side effects. Call your doctor for medical advice about side effects. You may report side effects to FDA at  1-800-FDA-1088. Where should I keep my medicine? Keep out of the reach of children. Store pre-filled syringes in a refrigerator between 2 and 8 degrees C (36 and 46 degrees F). Do not freeze. Keep in carton to protect from light. Throw away this medicine if it is left out of the refrigerator for more than 48 hours. Throw away any unused medicine after the expiration date. NOTE: This sheet is a summary. It may not cover all possible information. If you have questions about this medicine, talk to your doctor, pharmacist, or health care provider.  2015, Elsevier/Gold Standard. (2013-07-04 16:14:05)  

## 2014-01-15 NOTE — Telephone Encounter (Signed)
Called pt to check on her post Herceptin/Perjeta/Taxotere/Carboplatin 01/14/14 & she reports feeling good except dry mouth, h/a & lower back pain & constipation.  Discussed biotene, increased fluids, tylenol for pain, & pt is at drug store now to p/u miralax.  Suggested a stool softener to take starting before next treatment & to stay on PRN.  We discussed adding roughage foods to diet. She reports no fevers.  She know to call with any questions or concerns.

## 2014-01-15 NOTE — Telephone Encounter (Signed)
Message copied by Jesse Fall on Wed Jan 15, 2014  2:08 PM ------      Message from: Renford Dills      Created: Tue Jan 14, 2014  2:06 PM       1st Herceptin/Perjeta/Taxotere/Carboplatin.  Dr. Jana Hakim  [phone] ------

## 2014-01-16 ENCOUNTER — Telehealth: Payer: Self-pay | Admitting: *Deleted

## 2014-01-16 NOTE — Telephone Encounter (Signed)
Pt called to this RN to state side effects post chemo treatment.  Jessica [name] states she has had an ongoing headache unrelieved with tylenol since receiving chemo " it is pounding ". She also states onset of " low back pain " and " I am real shaky like anxiety ".  Per discussion of above regarding probable side effects secondary to support medications.  Plan is - Jessica [name] will take benadryl for headache with the tylenol.  She understands the pain in low back is probably related to the neulasta injection and will continue to use OTC including claritin ( while not on benadryl ).  Shakiness is probably due to the steroid- today is d2 of 3 day 8mg  bid dosing.   Jessica [name] states she has had no nausea or vomiting.  Recommended due to above symptoms for Jessica [name] to decrease dexamethasone to 1 tablet bid ( 4mg  ).  Jessica [name] will institute above plan and call if symptoms unrelieved or other concerns arise.

## 2014-01-20 ENCOUNTER — Ambulatory Visit (HOSPITAL_BASED_OUTPATIENT_CLINIC_OR_DEPARTMENT_OTHER): Payer: Medicaid Other | Admitting: Nurse Practitioner

## 2014-01-20 ENCOUNTER — Other Ambulatory Visit: Payer: Self-pay | Admitting: *Deleted

## 2014-01-20 ENCOUNTER — Telehealth: Payer: Self-pay | Admitting: *Deleted

## 2014-01-20 ENCOUNTER — Telehealth: Payer: Self-pay | Admitting: Nurse Practitioner

## 2014-01-20 ENCOUNTER — Encounter: Payer: Self-pay | Admitting: Nurse Practitioner

## 2014-01-20 ENCOUNTER — Ambulatory Visit (HOSPITAL_BASED_OUTPATIENT_CLINIC_OR_DEPARTMENT_OTHER): Payer: Medicaid Other

## 2014-01-20 ENCOUNTER — Other Ambulatory Visit: Payer: Self-pay | Admitting: Oncology

## 2014-01-20 VITALS — BP 141/82 | HR 94 | Temp 98.3°F | Resp 18 | Ht 65.0 in | Wt 195.3 lb

## 2014-01-20 DIAGNOSIS — F17200 Nicotine dependence, unspecified, uncomplicated: Secondary | ICD-10-CM

## 2014-01-20 DIAGNOSIS — Z95828 Presence of other vascular implants and grafts: Secondary | ICD-10-CM

## 2014-01-20 DIAGNOSIS — C50412 Malignant neoplasm of upper-outer quadrant of left female breast: Secondary | ICD-10-CM

## 2014-01-20 DIAGNOSIS — J019 Acute sinusitis, unspecified: Secondary | ICD-10-CM | POA: Insufficient documentation

## 2014-01-20 DIAGNOSIS — R21 Rash and other nonspecific skin eruption: Secondary | ICD-10-CM | POA: Insufficient documentation

## 2014-01-20 DIAGNOSIS — H6123 Impacted cerumen, bilateral: Secondary | ICD-10-CM | POA: Insufficient documentation

## 2014-01-20 DIAGNOSIS — Z452 Encounter for adjustment and management of vascular access device: Secondary | ICD-10-CM

## 2014-01-20 DIAGNOSIS — K1231 Oral mucositis (ulcerative) due to antineoplastic therapy: Secondary | ICD-10-CM | POA: Insufficient documentation

## 2014-01-20 DIAGNOSIS — J329 Chronic sinusitis, unspecified: Secondary | ICD-10-CM | POA: Insufficient documentation

## 2014-01-20 LAB — CBC WITH DIFFERENTIAL/PLATELET
BASO%: 0.3 % (ref 0.0–2.0)
Basophils Absolute: 0 10*3/uL (ref 0.0–0.1)
EOS ABS: 0.1 10*3/uL (ref 0.0–0.5)
EOS%: 3.3 % (ref 0.0–7.0)
HEMATOCRIT: 42.1 % (ref 34.8–46.6)
HEMOGLOBIN: 14.8 g/dL (ref 11.6–15.9)
LYMPH#: 1.4 10*3/uL (ref 0.9–3.3)
LYMPH%: 34.4 % (ref 14.0–49.7)
MCH: 33 pg (ref 25.1–34.0)
MCHC: 35.2 g/dL (ref 31.5–36.0)
MCV: 94 fL (ref 79.5–101.0)
MONO#: 0.4 10*3/uL (ref 0.1–0.9)
MONO%: 9.5 % (ref 0.0–14.0)
NEUT#: 2.1 10*3/uL (ref 1.5–6.5)
NEUT%: 52.5 % (ref 38.4–76.8)
Platelets: 98 10*3/uL — ABNORMAL LOW (ref 145–400)
RBC: 4.48 10*6/uL (ref 3.70–5.45)
RDW: 12.9 % (ref 11.2–14.5)
WBC: 4 10*3/uL (ref 3.9–10.3)
nRBC: 0 % (ref 0–0)

## 2014-01-20 LAB — COMPREHENSIVE METABOLIC PANEL (CC13)
ALK PHOS: 78 U/L (ref 40–150)
ALT: 28 U/L (ref 0–55)
AST: 13 U/L (ref 5–34)
Albumin: 3.8 g/dL (ref 3.5–5.0)
Anion Gap: 8 mEq/L (ref 3–11)
BUN: 12.1 mg/dL (ref 7.0–26.0)
CALCIUM: 9.8 mg/dL (ref 8.4–10.4)
CHLORIDE: 105 meq/L (ref 98–109)
CO2: 24 meq/L (ref 22–29)
Creatinine: 0.8 mg/dL (ref 0.6–1.1)
GLUCOSE: 81 mg/dL (ref 70–140)
POTASSIUM: 3.9 meq/L (ref 3.5–5.1)
SODIUM: 137 meq/L (ref 136–145)
TOTAL PROTEIN: 6.7 g/dL (ref 6.4–8.3)
Total Bilirubin: 0.36 mg/dL (ref 0.20–1.20)

## 2014-01-20 MED ORDER — SODIUM CHLORIDE 0.9 % IJ SOLN
10.0000 mL | INTRAMUSCULAR | Status: DC | PRN
  Administered 2014-01-20: 10 mL via INTRAVENOUS
  Filled 2014-01-20: qty 10

## 2014-01-20 MED ORDER — NYSTATIN 100000 UNIT/ML MT SUSP: 5.0000 mL | Freq: Four times a day (QID) | OROMUCOSAL | Status: DC | PRN

## 2014-01-20 MED ORDER — HYDROCODONE-ACETAMINOPHEN 5-325 MG PO TABS: 1.0000 | ORAL_TABLET | Freq: Four times a day (QID) | ORAL | Status: DC | PRN

## 2014-01-20 MED ORDER — AMOXICILLIN-POT CLAVULANATE 875-125 MG PO TABS: 1.0000 | ORAL_TABLET | Freq: Two times a day (BID) | ORAL | Status: DC

## 2014-01-20 MED ORDER — HEPARIN SOD (PORK) LOCK FLUSH 100 UNIT/ML IV SOLN
500.0000 [IU] | Freq: Once | INTRAVENOUS | Status: AC
  Administered 2014-01-20: 500 [IU] via INTRAVENOUS
  Filled 2014-01-20: qty 5

## 2014-01-20 NOTE — Progress Notes (Signed)
Patient also had any mild white coating to her tongue.  She had no oral lesions on exam.  Posterior oropharynx was slightly red.  Patient denied any oral/tongue sensitivity.  Will prescribe nystatin swish and spit.  Called patient at home and left a message to remind her to pick up the nystatin swish and spit.  Also advised patient she could call this provider back for further instructions if she would like.

## 2014-01-20 NOTE — Patient Instructions (Signed)

## 2014-01-20 NOTE — Assessment & Plan Note (Signed)
Patient received cycle 1 of her neoadjuvant carboplatin/docetaxel/Herceptin/Perjeta chemotherapy on 01/14/2014.  She received Neulasta injection for growth factor support on 01/15/2014.  She will continue to be seen on today's number one and 8 at each cycle for labs and follow up visit as well.  She'll be due for cycle 2 of the same regimen on 02/04/2014.

## 2014-01-20 NOTE — Assessment & Plan Note (Signed)
Patient does have an excessive amount of cerumen in her bilateral ears.  Advised patient that she should obtain an ear wax removal kit over-the-counter.  Suggested she wait till she is feeling better in the next few days to try cleaning her ears.  Advised that she follow with her primary care physician if she is unsuccessful in clearing her ears.

## 2014-01-20 NOTE — Telephone Encounter (Signed)
   Provider input needed: SYMPTOMS AFTER CHEMO         Reason for call: HEADACHE, RUNNY NOSE, AND SORE THROAT     ALLERGIES:  has No Known Allergies.  Patient last received chemotherapy/ treatment on 01/14/14 HERCEPTIN AND PERJETA  01/15/14 NEULASTA  Patient was last seen in the office on 01/10/14  Next appt is 01/21/14.  Is patient having fevers greater than 100.5?  no, TEMPERATURE IS NORMAL   Is patient having uncontrolled pain, or new pain? yes, HEADACHE   Is patient having new back pain that changes with position (worsens or eases when laying down?)  no   Is patient able to eat and drink? yes    Is patient able to pass stool without difficulty?   yes     Is patient having uncontrolled nausea?  no    patient calls 01/20/2014 with complaint of  Ears, nose, mouth, throat, and face: positive for nasal congestion, sore throat and SWELLING AROUND HER EYES WITH PRESSURE. Respiratory: positive for cough and SLIGHT TIGHTNESS IN CHEST WITH SLIGHT SHORTNESS OF BREATH. PT.'S COUGH IS DRY AND NASAL CONGESTION IS CLEAR. THERE IS A WHITE FILM ON PT.'S TONGUE. Neurological: positive for headaches and NO RELIEF WITH TYLENOL, BENADRYL, OR CLARITIN. PAIN SCALE IS AT A NINE.   Summary Based on the above information advised patient to  [place].   Waverly Ferrari M  01/20/2014, 10:03 AM   Background Info  JAKIERA EHLER   DOB: [birth year]   MR#: [id number]   CSN#   [id number] 01/20/2014

## 2014-01-20 NOTE — Telephone Encounter (Signed)
, °

## 2014-01-20 NOTE — Assessment & Plan Note (Signed)
Patient does appear to have significant sinusitis today.  Patient was prescribed Augmentin 1 tablet twice daily for a total of 10 days.  Patient did request hydrocodone for her sinus headache.  Prescription was given for hydrocodone per her request.

## 2014-01-20 NOTE — Assessment & Plan Note (Signed)
The patient is noted to have a thin white coating to her tongue.  She has no oral lesions.  She denies any specific oral/tongue sensitivity.  Will orders nystatin  swish and spit for mild thrush.

## 2014-01-20 NOTE — Progress Notes (Signed)
[facility]   Chief Complaint  Patient presents with  . Sinusitis    Headache    HPI: Jessica [name] 48 y.o. female diagnosed with breast cancer.  Patient is currently undergoing neoadjuvant carboplatin/docetaxel/Herceptin/Perjeta  Chemotherapy regimen.  The plan is for the patient to receive a total of 6 cycles of chemotherapy; followed by definitive surgery; and then a possible radiation therapy.  Patient called the cancer Center today requesting urgent care visit.  She states she has been suffering with some severe sinusitis since this past Friday, 01/17/2014.  She is complaining of a headache, sinus pressure, ear fullness, mild sore throat, facial pain.  She states that "her teeth hurt".  She denies any recent fevers or chills.  She also denies any cough or shortness of breath.    Sinusitis    CURRENT THERAPY: Upcoming Treatment Dates - BREAST Docetaxel / Carboplatin reduced AUC q21d (Order Herceptin separately) Days with orders from any treatment category:  02/04/2014      SCHEDULING COMMUNICATION      ondansetron (ZOFRAN) IVPB 16 mg      Dexamethasone Sodium Phosphate (DECADRON) injection 20 mg      DOCEtaxel (TAXOTERE) 150 mg in dextrose 5 % 250 mL chemo infusion      CARBOplatin (PARAPLATIN) 720.5 mg in sodium chloride 0.9 % 250 mL chemo infusion      lidocaine-prilocaine (EMLA) cream      tobramycin-dexamethasone (TOBRADEX) ophthalmic solution      sodium chloride 0.9 % injection 10 mL      heparin lock flush 100 unit/mL      heparin lock flush 100 unit/mL      alteplase (CATHFLO ACTIVASE) injection 2 mg      sodium chloride 0.9 % injection 3 mL      Cold Pack 1 packet      diphenhydrAMINE (BENADRYL) injection 25 mg      famotidine (PEPCID) IVPB 20 mg      0.9 %  sodium chloride infusion      methylPREDNISolone sodium succinate (SOLU-MEDROL) 125 mg/2 mL injection 125 mg      EPINEPHrine (ADRENALIN) 0.1 MG/ML injection 0.25 mg      EPINEPHrine  (ADRENALIN) 0.1 MG/ML injection 0.25 mg      EPINEPHrine (ADRENALIN) injection 0.5 mg      EPINEPHrine (ADRENALIN) injection 0.5 mg      diphenhydrAMINE (BENADRYL) injection 50 mg      albuterol (PROVENTIL) (2.5 MG/3ML) 0.083% nebulizer solution 2.5 mg      0.9 %  sodium chloride infusion      TREATMENT CONDITIONS 02/05/2014      SCHEDULING COMMUNICATION INJECTION      lidocaine-prilocaine (EMLA) cream      tobramycin-dexamethasone (TOBRADEX) ophthalmic solution      pegfilgrastim (NEULASTA) injection 6 mg 02/25/2014      SCHEDULING COMMUNICATION      ondansetron (ZOFRAN) IVPB 16 mg      Dexamethasone Sodium Phosphate (DECADRON) injection 20 mg      DOCEtaxel (TAXOTERE) 150 mg in dextrose 5 % 250 mL chemo infusion      CARBOplatin (PARAPLATIN) 720.5 mg in sodium chloride 0.9 % 250 mL chemo infusion      lidocaine-prilocaine (EMLA) cream      tobramycin-dexamethasone (TOBRADEX) ophthalmic solution      sodium chloride 0.9 % injection 10 mL      heparin lock flush 100 unit/mL      heparin lock flush 100 unit/mL  alteplase (CATHFLO ACTIVASE) injection 2 mg      sodium chloride 0.9 % injection 3 mL      Cold Pack 1 packet      diphenhydrAMINE (BENADRYL) injection 25 mg      famotidine (PEPCID) IVPB 20 mg      0.9 %  sodium chloride infusion      methylPREDNISolone sodium succinate (SOLU-MEDROL) 125 mg/2 mL injection 125 mg      EPINEPHrine (ADRENALIN) 0.1 MG/ML injection 0.25 mg      EPINEPHrine (ADRENALIN) 0.1 MG/ML injection 0.25 mg      EPINEPHrine (ADRENALIN) injection 0.5 mg      EPINEPHrine (ADRENALIN) injection 0.5 mg      diphenhydrAMINE (BENADRYL) injection 50 mg      albuterol (PROVENTIL) (2.5 MG/3ML) 0.083% nebulizer solution 2.5 mg      0.9 %  sodium chloride infusion      TREATMENT CONDITIONS    ROS  Past Medical History  Diagnosis Date  . Anxiety   . COPD (chronic obstructive pulmonary disease) 2011    does not use inhaler ofter  . Depression   . Hot  flashes   . Family history of anesthesia complication     Had a hard time waking father up only once after several procedures  . Shortness of breath     with exertion  . Heart murmur     PMH: at age 56; no longer have murmur  . Chronic heartburn   . PVC (premature ventricular contraction)     PMH;at 48 y.o.  . Cancer     DCIS  . Asthma     denies history of asthma    Past Surgical History  Procedure Laterality Date  . Polyp removal     . Tonsillectomy    . Wisdom tooth extraction    . Robotic assisted total hysterectomy with bilateral salpingo oopherectomy Bilateral 05/24/2013    Procedure: ROBOTIC ASSISTED TOTAL HYSTERECTOMY WITH BILATERAL SALPINGECTOMY;  Surgeon: Lavonia Drafts, MD;  Location: [facility];  Service: Gynecology;  Laterality: Bilateral;  . Cystoscopy N/A 05/24/2013    Procedure: CYSTOSCOPY;  Surgeon: Lavonia Drafts, MD;  Location: [facility];  Service: Gynecology;  Laterality: N/A;  . Abdominal hysterectomy    . Breast surgery      left breast biopsy x 2  . Portacath placement Right 01/02/2014    Procedure: INSERTION PORT-A-CATH;  Surgeon: [name], MD;  Location: [facility];  Service: General;  Laterality: Right;    has HYPERTRIGLYCERIDEMIA; TOBACCO ABUSE; CHRONIC OBSTRUCTIVE PULMONARY DISEASE; OVARIAN CYST; MENORRHAGIA; ELEVATED BLOOD PRESSURE WITHOUT DIAGNOSIS OF HYPERTENSION; Postoperative infection; Breast cancer of upper-outer quadrant of left female breast; Sinusitis; Acute sinusitis; Rash; and Excessive cerumen in both ear canals on her problem list.     has No Known Allergies.    Medication List       This list is accurate as of: 01/20/14 11:34 AM.  Always use your most recent med list.               amoxicillin-clavulanate 875-125 MG per tablet  Commonly known as:  AUGMENTIN  Take 1 tablet by mouth 2 (two) times daily.     budesonide-formoterol 160-4.5 MCG/ACT inhaler  Commonly known as:  SYMBICORT  Inhale 1 puff into the lungs 2  (two) times daily.     calcium carbonate 500 MG chewable tablet  Commonly known as:  TUMS - dosed in mg elemental calcium  Chew 1 tablet by mouth daily.  dexamethasone 4 MG tablet  Commonly known as:  DECADRON  Take 2 tablets (8 mg total) by mouth 2 (two) times daily. Start the day before Taxotere. Then again the day after chemo for 3 days.     gabapentin 600 MG tablet  Commonly known as:  NEURONTIN  Take 600 mg by mouth at bedtime as needed (anxiety).     HYDROcodone-acetaminophen 5-325 MG per tablet  Commonly known as:  NORCO/VICODIN  Take 1-2 tablets by mouth every 6 (six) hours as needed for moderate pain.     ibuprofen 800 MG tablet  Commonly known as:  ADVIL,MOTRIN  Take 800 mg by mouth every 8 (eight) hours as needed (pain).     ipratropium 17 MCG/ACT inhaler  Commonly known as:  ATROVENT HFA  Inhale 1 puff into the lungs every 6 (six) hours as needed for wheezing (SOB).     lidocaine-prilocaine cream  Commonly known as:  EMLA  Apply 1 application topically as needed. Apply over port site 1-2 hours before chemotherapy, cover with plastic wrap     loratadine 10 MG tablet  Commonly known as:  CLARITIN  Take 1 tablet (10 mg total) by mouth as needed.     LORazepam 0.5 MG tablet  Commonly known as:  ATIVAN  Take 1 tablet (0.5 mg total) by mouth 2 (two) times daily as needed for anxiety.     LORazepam 0.5 MG tablet  Commonly known as:  ATIVAN  Take 1 tablet (0.5 mg total) by mouth at bedtime as needed (Nausea or vomiting).     ondansetron 8 MG tablet  Commonly known as:  ZOFRAN  Take 1 tablet (8 mg total) by mouth 2 (two) times daily. Start the day after chemo for 3 days. Then take as needed for nausea or vomiting.     prochlorperazine 10 MG tablet  Commonly known as:  COMPAZINE  Take 1 tablet (10 mg total) by mouth every 6 (six) hours as needed (Nausea or vomiting).     tobramycin-dexamethasone ophthalmic solution  Commonly known as:  TOBRADEX  Place 1 drop  into both eyes 2 (two) times daily.         PHYSICAL EXAMINATION  Blood pressure 141/82, pulse 94, temperature 98.3 F (36.8 C), temperature source Oral, resp. rate 18, height _0  (1.651 m), weight 195 lb 4.8 oz (88.587 kg), last menstrual period 03/18/2013.  Physical Exam  Nursing note and vitals reviewed. Constitutional: She is oriented to person, place, and time and well-developed, well-nourished, and in no distress.  HENT:  Head: Normocephalic and atraumatic.  Mouth/Throat: No oropharyngeal exudate.  Patient does have moderate tenderness with palpation to entire face.  No specific dental issues noted.  No oral lesions noted.  Posterior oropharynx is erythmatous.   Eyes: Conjunctivae and EOM are normal. Pupils are equal, round, and reactive to light. Right eye exhibits no discharge. Left eye exhibits no discharge. No scleral icterus.  Neck: Normal range of motion. Neck supple. No JVD present. No tracheal deviation present. No thyromegaly present.  Cardiovascular: Normal rate, regular rhythm, normal heart sounds and intact distal pulses.   Pulmonary/Chest: Effort normal and breath sounds normal. No respiratory distress. She has no wheezes.  Abdominal: Soft. Bowel sounds are normal. She exhibits no distension. There is no tenderness. There is no rebound.  Musculoskeletal: Normal range of motion. She exhibits no edema and no tenderness.  Lymphadenopathy:    She has no cervical adenopathy.  Neurological: She is alert and oriented to  person, place, and time. Gait normal.  Skin: Skin is warm and dry. No rash noted. No erythema.  Psychiatric: Affect normal.    LABORATORY DATA:.Labs are pending results; and will be reviewed tomorrow 01/21/2014.   ASSESSMENT/PLAN:    TOBACCO ABUSE  Assessment & Plan Patient continues to smoke on a daily basis.  She states that she has decreased from a pack per day to about a quarter of a pack per day.  Patient was encouraged to discontinue smoking  altogether if at all possible.   Breast cancer of upper-outer quadrant of left female breast  Assessment & Plan Patient received cycle 1 of her neoadjuvant carboplatin/docetaxel/Herceptin/Perjeta chemotherapy on 01/14/2014.  She received Neulasta injection for growth factor support on 01/15/2014.  She will continue to be seen on today's number one and 8 at each cycle for labs and follow up visit as well.  She'll be due for cycle 2 of the same regimen on 02/04/2014.   Sinusitis  Assessment & Plan Patient does appear to have significant sinusitis today.  Patient was prescribed Augmentin 1 tablet twice daily for a total of 10 days.  Patient did request hydrocodone for her sinus headache.  Prescription was given for hydrocodone per her request.   Rash  Assessment & Plan Patient has developed a very mild acneform scattered rash to her face and her anterior chest region.  Patient denies any pruritus with this rash.  I reviewed with patient that she could try Benadryl 25 mg every 6 hours and Pepcid 20 mg every 12 hours since this is most likely a chemotherapy-induced rash.   Excessive cerumen in both ear canals  Assessment & Plan Patient does have an excessive amount of cerumen in her bilateral ears.  Advised patient that she should obtain an ear wax removal kit over-the-counter.  Suggested she wait till she is feeling better in the next few days to try cleaning her ears.  Advised that she follow with her primary care physician if she is unsuccessful in clearing her ears.   Patient stated understanding of all instructions; and was in agreement with this plan of care. The patient knows to call the clinic with any problems, questions or concerns.   Review/collaboration with Dr.  Jana Hakim  regarding all aspects of patient's visit today.   Advised both patient and her significant other that could draw follow up labs this morning; but that she would still need to return to the [facility] as  previously scheduled tomorrow for followup with Dr. Jana Hakim.  Total time spent with patient was  25  minutes;  with greater than  with 75  percent of that time spent in face to face counseling regarding  her symptoms,  and coordination of care and follow up.  Disclaimer: This note was dictated with voice recognition software. Similar sounding words can inadvertently be transcribed and may not be corrected upon review.   Drue Second, NP 01/20/2014

## 2014-01-20 NOTE — Addendum Note (Signed)
Addended by: Drue Second R on: 01/20/2014 12:20 PM   Modules accepted: Orders

## 2014-01-20 NOTE — Assessment & Plan Note (Signed)
Patient has developed a very mild acneform scattered rash to her face and her anterior chest region.  Patient denies any pruritus with this rash.  I reviewed with patient that she could try Benadryl 25 mg every 6 hours and Pepcid 20 mg every 12 hours since this is most likely a chemotherapy-induced rash.

## 2014-01-20 NOTE — Assessment & Plan Note (Signed)
Patient continues to smoke on a daily basis.  She states that she has decreased from a pack per day to about a quarter of a pack per day.  Patient was encouraged to discontinue smoking altogether if at all possible.

## 2014-01-21 ENCOUNTER — Ambulatory Visit (HOSPITAL_BASED_OUTPATIENT_CLINIC_OR_DEPARTMENT_OTHER): Payer: Medicaid Other | Admitting: Oncology

## 2014-01-21 ENCOUNTER — Other Ambulatory Visit: Payer: Medicaid Other

## 2014-01-21 ENCOUNTER — Ambulatory Visit: Payer: Medicaid Other | Admitting: Oncology

## 2014-01-21 VITALS — BP 143/96 | HR 101 | Temp 98.6°F | Resp 18 | Ht 65.0 in | Wt 195.7 lb

## 2014-01-21 DIAGNOSIS — C50412 Malignant neoplasm of upper-outer quadrant of left female breast: Secondary | ICD-10-CM

## 2014-01-21 DIAGNOSIS — J0111 Acute recurrent frontal sinusitis: Secondary | ICD-10-CM

## 2014-01-21 DIAGNOSIS — Z170 Estrogen receptor positive status [ER+]: Secondary | ICD-10-CM

## 2014-01-21 DIAGNOSIS — F17200 Nicotine dependence, unspecified, uncomplicated: Secondary | ICD-10-CM

## 2014-01-21 NOTE — Progress Notes (Signed)
[place]  [phone] [phone]     ID: Jessica [name] DOB: 14-Aug-1965  MR#: [id number]  IOE#:[id number]  Patient Care Team: [name], MD as PCP - General (Nurse Practitioner) [name], MD as Consulting Physician (General Surgery) [name], MD as Consulting Physician (Oncology) [name], MD as Consulting Physician (Radiation Oncology)  CHIEF COMPLAINT: Newly diagnosed triple positive breast cancer CURRENT TREATMENT: neoadjuvant chemo-immunotherapy  BREAST CANCER HISTORY: From the original intake note:  Jessica [name] herself palpated a mass in her left breast. She brought it to the attention of Jessica [name] at the health department and she was set up for unilateral left mammography and ultrasound 11/27/2013 at the [facility] Center. This showed a possible distortion in the left breast upper outer quadrant. However the patient's breasts are density category D. On exam there was a firm nodule or mass in the left breast 2:00 location which by ultrasound was irregular and hypoechoic and measured 1.5 cm. There was no left axillary lymphadenopathy noted.  Biopsy of the mass in question 11/29/2013 showed ([id number]) an invasive ductal carcinoma, grade 1, estrogen receptor 94% positive, progesterone receptor 94% positive, both with strong staining intensity, with an MIB-1 of 46%, and HER-2 amplified by CISH, the signals ratio of 4.85 and the number per cell 6.55.  : 12/08/2013 the patient underwent bilateral breast MRI. This showed in the left breast upper outer quadrant a 2.9 cm irregular enhancing mass and extending posteriorly from this an area of clumped non-masslike enhancement extending approximately 6 cm and leading to a posterior group of nodules which were small but measured in aggregate 2.2 cm. In addition, there was a separate 0.8 cm irregular spiculated enhancing mass in the upper inner aspect of the left breast. In the  lower outer left breast there was a 0.8 cm oval enhancing mass. There were no abnormal appearing lymph nodes.  [name] subsequent history is as detailed below  INTERVAL HISTORY: Jessica [name] returns today for followup of her breast cancer accompanied by her significant other Jessica [name]. Today is day 8 cycle 1 of 6 planned cycles of carboplatin and docetaxel pertuzumab and trastuzumab to be given every 3 weeks. The trastuzumab will be continued to complete one year.  REVIEW OF SYSTEMS: Jessica [name] developed a severe sinus headaches which brought her here yesterday. She was started on amoxicillin and a ready is feeling a bit better. Aside from the headache she developed mild thrush. She also had an acneform rash over her face and neck. This is described in the physical exam. She has a history of mild carpal tunnel syndrome in her right hand and has a little bit more of that after this chemotherapy. This will have to be followed closely. She had no nausea or vomiting. She was initially constipated, took stool softeners and MiraLAX, and then had loose bowel movements for one day. Her bowel movements are now normal. Her energy is "okay" the biggest problem she is having is financial. They were not able to by the hydrocodone or nystatin was prescribed yesterday. They have applied for financial 8. A detailed review of systems today is otherwise stable   PAST MEDICAL HISTORY: Past Medical History  Diagnosis Date  . Anxiety   . COPD (chronic obstructive pulmonary disease) 2011    does not use inhaler ofter  . Depression   . Hot flashes   . Family history of anesthesia complication     Had a hard time waking father up only once  after several procedures  . Shortness of breath     with exertion  . Heart murmur     PMH: at age 24; no longer have murmur  . Chronic heartburn   . PVC (premature ventricular contraction)     PMH;at 48 y.o.  . Cancer     DCIS  . Asthma     denies history of asthma    PAST SURGICAL  HISTORY: Past Surgical History  Procedure Laterality Date  . Polyp removal     . Tonsillectomy    . Wisdom tooth extraction    . Robotic assisted total hysterectomy with bilateral salpingo oopherectomy Bilateral 05/24/2013    Procedure: ROBOTIC ASSISTED TOTAL HYSTERECTOMY WITH BILATERAL SALPINGECTOMY;  Surgeon: Lavonia Drafts, MD;  Location: [facility];  Service: Gynecology;  Laterality: Bilateral;  . Cystoscopy N/A 05/24/2013    Procedure: CYSTOSCOPY;  Surgeon: Lavonia Drafts, MD;  Location: [facility];  Service: Gynecology;  Laterality: N/A;  . Abdominal hysterectomy    . Breast surgery      left breast biopsy x 2  . Portacath placement Right 01/02/2014    Procedure: INSERTION PORT-A-CATH;  Surgeon: [name], MD;  Location: [facility];  Service: General;  Laterality: Right;    FAMILY HISTORY Family History  Problem Relation Age of Onset  . Heart disease Mother   . Hypercholesterolemia Mother   . Heart disease Father   . COPD Father   . Hypercholesterolemia Father   . Cancer Neg Hx   [name] parents are still living, her father is 21 years old and her mother 31 years old as of August 2015. The patient had no brothers, one sister. There is no history of breast or ovarian cancer in the family.  GYNECOLOGIC HISTORY:  Patient's last menstrual period was 03/18/2013. Menarche age 43. She is GX P0. She took oral contraceptives for many years as well as Depo Provera shots. She is status post hysterectomy with bilateral salpingectomy. 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 She scans at work, and she also does all this she'll vein and stocking. She is single but for the last 11 years as lives with her significant other Jessica [name] Day. He is disabled secondary to multiple orthopedic problems including bilateral avascular necrosis of the hips. Both of them do smoke. The patient is here for drinks on weekends    ADVANCED DIRECTIVES:  Not in place    HEALTH MAINTENANCE: History  Substance Use Topics  . Smoking status: Current Every Day Smoker -- 1.00 packs/day for 16 years    Types: Cigarettes  . Smokeless tobacco: Never Used  . Alcohol Use: Yes     Comment: occasional      Colonoscopy:  PAP: January 2015   Bone density:  Lipid panel:  No Known Allergies  Current Outpatient Prescriptions  Medication Sig Dispense Refill  . amoxicillin-clavulanate (AUGMENTIN) 875-125 MG per tablet Take 1 tablet by mouth 2 (two) times daily.  20 tablet  0  . budesonide-formoterol (SYMBICORT) 160-4.5 MCG/ACT inhaler Inhale 1 puff into the lungs 2 (two) times daily.       . calcium carbonate (TUMS - DOSED IN MG ELEMENTAL CALCIUM) 500 MG chewable tablet Chew 1 tablet by mouth daily.      Marland Kitchen dexamethasone (DECADRON) 4 MG tablet Take 2 tablets (8 mg total) by mouth 2 (two) times daily. Start the day before Taxotere. Then again the day after chemo for 3  days.  30 tablet  1  . gabapentin (NEURONTIN) 600 MG tablet Take 600 mg by mouth at bedtime as needed (anxiety).       Marland Kitchen HYDROcodone-acetaminophen (NORCO/VICODIN) 5-325 MG per tablet Take 1-2 tablets by mouth every 6 (six) hours as needed for moderate pain.  30 tablet  0  . ibuprofen (ADVIL,MOTRIN) 800 MG tablet Take 800 mg by mouth every 8 (eight) hours as needed (pain).      Marland Kitchen ipratropium (ATROVENT HFA) 17 MCG/ACT inhaler Inhale 1 puff into the lungs every 6 (six) hours as needed for wheezing (SOB).       Marland Kitchen lidocaine-prilocaine (EMLA) cream Apply 1 application topically as needed. Apply over port site 1-2 hours before chemotherapy, cover with plastic wrap  30 g  0  . loratadine (CLARITIN) 10 MG tablet Take 1 tablet (10 mg total) by mouth as needed.  30 tablet  1  . LORazepam (ATIVAN) 0.5 MG tablet Take 1 tablet (0.5 mg total) by mouth 2 (two) times daily as needed for anxiety.  60 tablet  0  . LORazepam (ATIVAN) 0.5 MG tablet Take 1 tablet (0.5 mg total) by mouth at bedtime as needed  (Nausea or vomiting).  30 tablet  0  . nystatin (MYCOSTATIN) 100000 UNIT/ML suspension Take 5 mLs (500,000 Units total) by mouth 4 (four) times daily as needed (Swish and spit).  60 mL  0  . ondansetron (ZOFRAN) 8 MG tablet Take 1 tablet (8 mg total) by mouth 2 (two) times daily. Start the day after chemo for 3 days. Then take as needed for nausea or vomiting.  30 tablet  1  . prochlorperazine (COMPAZINE) 10 MG tablet Take 1 tablet (10 mg total) by mouth every 6 (six) hours as needed (Nausea or vomiting).  30 tablet  1  . tobramycin-dexamethasone (TOBRADEX) ophthalmic solution Place 1 drop into both eyes 2 (two) times daily.  5 mL  0   No current facility-administered medications for this visit.    OBJECTIVE: middle-aged white woman in no acute distress Filed Vitals:   01/21/14 0809  BP: 143/96  Pulse: 101  Temp: 98.6 F (37 C)  Resp: 18     Body mass index is 32.57 kg/(m^2).    ECOG FS:1 - Symptomatic but completely ambulatory  Sclerae unicteric, EOMs intact Oropharynx clear and moist No cervical or supraclavicular adenopathy Lungs no rales or rhonchi Heart regular rate and rhythm Abd soft, nontender, positive bowel sounds MSK no focal spinal tenderness, no upper extremity lymphedema Neuro: nonfocal, well oriented, positive affect Breasts: Deferred Skin: There is a very scattered acneform rash over the face and anterior neck; there is no evidence of cellulitis or inflammation   LAB RESULTS:  CMP     Component Value Date/Time   NA 137 01/20/2014 1118   NA 139 12/31/2013 1448   K 3.9 01/20/2014 1118   K 4.6 12/31/2013 1448   CL 103 12/31/2013 1448   CO2 24 01/20/2014 1118   CO2 24 12/31/2013 1448   GLUCOSE 81 01/20/2014 1118   GLUCOSE 93 12/31/2013 1448   BUN 12.1 01/20/2014 1118   BUN 9 12/31/2013 1448   CREATININE 0.8 01/20/2014 1118   CREATININE 0.83 12/31/2013 1448   CALCIUM 9.8 01/20/2014 1118   CALCIUM 8.8 12/31/2013 1448   PROT 6.7 01/20/2014 1118   PROT 7.1 06/04/2013 1511     ALBUMIN 3.8 01/20/2014 1118   ALBUMIN 3.4* 06/04/2013 1511   AST 13 01/20/2014 1118   AST 12  06/04/2013 1511   ALT 28 01/20/2014 1118   ALT 14 06/04/2013 1511   ALKPHOS 78 01/20/2014 1118   ALKPHOS 70 06/04/2013 1511   BILITOT 0.36 01/20/2014 1118   BILITOT 0.5 06/04/2013 1511   GFRNONAA 83* 12/31/2013 1448   GFRAA >90 12/31/2013 1448    I No results found for this basename: SPEP,  UPEP,   kappa and lambda light chains    Lab Results  Component Value Date   WBC 4.0 01/20/2014   NEUTROABS 2.1 01/20/2014   HGB 14.8 01/20/2014   HCT 42.1 01/20/2014   MCV 94.0 01/20/2014   PLT 98* 01/20/2014      Chemistry      Component Value Date/Time   NA 137 01/20/2014 1118   NA 139 12/31/2013 1448   K 3.9 01/20/2014 1118   K 4.6 12/31/2013 1448   CL 103 12/31/2013 1448   CO2 24 01/20/2014 1118   CO2 24 12/31/2013 1448   BUN 12.1 01/20/2014 1118   BUN 9 12/31/2013 1448   CREATININE 0.8 01/20/2014 1118   CREATININE 0.83 12/31/2013 1448      Component Value Date/Time   CALCIUM 9.8 01/20/2014 1118   CALCIUM 8.8 12/31/2013 1448   ALKPHOS 78 01/20/2014 1118   ALKPHOS 70 06/04/2013 1511   AST 13 01/20/2014 1118   AST 12 06/04/2013 1511   ALT 28 01/20/2014 1118   ALT 14 06/04/2013 1511   BILITOT 0.36 01/20/2014 1118   BILITOT 0.5 06/04/2013 1511       No results found for this basename: LABCA2    No components found with this basename: LABCA125    No results found for this basename: INR,  in the last 168 hours  Urinalysis    Component Value Date/Time   COLORURINE YELLOW 06/04/2013 1420   APPEARANCEUR HAZY* 06/04/2013 1420   LABSPEC >1.030* 06/04/2013 1420   PHURINE 6.0 06/04/2013 1420   GLUCOSEU NEGATIVE 06/04/2013 1420   HGBUR MODERATE* 06/04/2013 1420   HGBUR negative 10/07/2009 [place] 06/04/2013 1420   KETONESUR NEGATIVE 06/04/2013 1420   PROTEINUR NEGATIVE 06/04/2013 1420   UROBILINOGEN 0.2 06/04/2013 1420   NITRITE NEGATIVE 06/04/2013 1420   LEUKOCYTESUR NEGATIVE 06/04/2013 1420     STUDIES: Dg Chest 2 View  12/31/2013   CLINICAL DATA:  History of breast cancer, preoperative evaluation for port placement  EXAM: CHEST  2 VIEW  COMPARISON:  06/11/2009  FINDINGS: Cardiac shadow is within normal limits. The lungs are clear bilaterally. No acute bony abnormality is seen.  IMPRESSION: No active cardiopulmonary disease.   Electronically Signed   By: Inez Catalina M.D.   On: 12/31/2013 15:46   Dg Chest Port 1 View  01/02/2014   CLINICAL DATA:  Port placement.  EXAM: PORTABLE CHEST - 1 VIEW  COMPARISON:  12/31/2013  FINDINGS: There has been interval placement of a right subclavian Port-A-Cath with tip overlying the lower SVC. Cardiac silhouette is likely within normal limits for size, mildly accentuated by AP technique and slightly shallow lung inflation. No airspace consolidation, edema, pleural effusion, or pneumothorax is identified. No acute osseous abnormality is identified.  IMPRESSION: Right subclavian Port-A-Cath as above.   Electronically Signed   By: Logan Bores   On: 01/02/2014 11:28   Dg Fluoro Guide Cv Line-no Report  01/02/2014   CLINICAL DATA: port placement, left breast cancer   FLOURO GUIDE CV LINE  Fluoroscopy was utilized by the requesting physician.  No radiographic  interpretation.  ASSESSMENT: 48 y.o. BRCA negative [place] woman status post left breast upper outer quadrant biopsy 11/29/2013 for a clinical T2 N0, stage IIA invasive ductal carcinoma, grade 1, triple positive, with an MIB-1 of 46%.  (1) MRI-guided biopsy of two additional areas in the left breast showed only tubular adenomas, no malignancy.   (2) neoadjuvant chemotherapy to start 01/14/2014, consisting of carboplatin, docetaxel, trastuzumab and pertuzumab given every 21 days for 6 cycles, with Neulasta on day 2-- echocardiogram 12/19/2013 showed an ejection fraction in the 50-55% range.  (3) trastuzumab to be continued to the total one year, through September of 2016  (4) definitive  surgery to follow chemotherapy  (5) radiation to follow surgery  (6) antiestrogen therapy to follow radiation  PLAN: She tolerated the first cycle generally well. I think the sinusitis is incidental and not caused by the treatment.  She does have acne and had some thrush as well as blurred vision. I think all those problems are due to dexamethasone. She does have to take the dexamethasone on day 0 and we will of course give it to her on day 1, but the DEXA methicillin on days 23 and 4 will be omitted. She will take ondansetron and prochlorperazine instead for nausea control. I hope that will take care of those issues.  She has some carpal tunnel in the right hand. We will have to watch that closely to make sure she does not develop worsening neuropathy at that particular site. She did not have any neuropathy in the left hand or toes.  Aanshi has significant money problems at present and we're trying to get her some financial assistance.  Koby has a good understanding of the overall plan. She agrees with it. She knows the goal of treatment in her case is cure. She will call with any problems that may develop before her next visit here, which will be with her second cycle.  [name], MD   01/21/2014 8:16 AM

## 2014-01-22 ENCOUNTER — Other Ambulatory Visit: Payer: Self-pay | Admitting: *Deleted

## 2014-01-22 MED ORDER — FLUCONAZOLE 100 MG PO TABS: 100.0000 mg | ORAL_TABLET | Freq: Every day | ORAL | Status: DC

## 2014-01-22 NOTE — Telephone Encounter (Signed)
This RN spoke with pt per her call stating onset of " red splotches on my head " " I'm not loosing my hair yet but I got it cut real short "-  Lorah also states she is being treated with ABX for sinus infection " and my nose is sore- I had a little blood from nose early today and am using polysporin "  Reeta requested an antifungal to be prescribed " because I always get a yeast infection when I am on an ABX"  Per call this RN discussed with pt probable folliculitis from chemo and anticipated hair loss occuring. Advise on use of creams for symptom management -  Prescription for diflucan obtained and sent to pt's pharmacy.  No other needs at this time- pt understands to call if additional concerns arise.

## 2014-01-27 ENCOUNTER — Other Ambulatory Visit (HOSPITAL_BASED_OUTPATIENT_CLINIC_OR_DEPARTMENT_OTHER): Payer: Medicaid Other | Admitting: Nurse Practitioner

## 2014-01-27 ENCOUNTER — Ambulatory Visit: Payer: Medicaid Other

## 2014-01-27 ENCOUNTER — Ambulatory Visit (HOSPITAL_BASED_OUTPATIENT_CLINIC_OR_DEPARTMENT_OTHER): Payer: Medicaid Other | Admitting: Nurse Practitioner

## 2014-01-27 VITALS — BP 143/86 | HR 77 | Temp 98.4°F | Resp 18 | Ht 65.0 in | Wt 196.7 lb

## 2014-01-27 DIAGNOSIS — C50412 Malignant neoplasm of upper-outer quadrant of left female breast: Secondary | ICD-10-CM

## 2014-01-27 DIAGNOSIS — N390 Urinary tract infection, site not specified: Secondary | ICD-10-CM

## 2014-01-27 DIAGNOSIS — H6123 Impacted cerumen, bilateral: Secondary | ICD-10-CM

## 2014-01-27 DIAGNOSIS — H6120 Impacted cerumen, unspecified ear: Secondary | ICD-10-CM | POA: Insufficient documentation

## 2014-01-27 DIAGNOSIS — K1231 Oral mucositis (ulcerative) due to antineoplastic therapy: Secondary | ICD-10-CM

## 2014-01-27 DIAGNOSIS — R197 Diarrhea, unspecified: Secondary | ICD-10-CM

## 2014-01-27 DIAGNOSIS — J019 Acute sinusitis, unspecified: Secondary | ICD-10-CM

## 2014-01-27 LAB — URINALYSIS, MICROSCOPIC - CHCC
BLOOD: NEGATIVE
Glucose: NEGATIVE mg/dL
PH: 5 (ref 4.6–8.0)
Specific Gravity, Urine: 1.03 (ref 1.003–1.035)

## 2014-01-27 MED ORDER — CIPROFLOXACIN HCL 500 MG PO TABS: 500.0000 mg | ORAL_TABLET | Freq: Two times a day (BID) | ORAL | Status: DC

## 2014-01-27 MED ORDER — FLUTICASONE PROPIONATE 50 MCG/ACT NA SUSP: 2.0000 | Freq: Every day | NASAL | Status: DC

## 2014-01-27 NOTE — Assessment & Plan Note (Signed)
Patient was noted to have significant cerumen to bilateral ears on exam last week.  Patient obtained earwax removal kit over-the-counter; and has been using cerumen softening oil drops to her bilateral ears for the past few days.  She's also been trying to flush her ears with minimal effectiveness.  Attempted to manually disimpact bilat ears; but unable to do so effectively today. Patient states that she will continue to use oil drops to bilat ears; and attempt to flush at home.  Obtained an ENT referral appointment for evaluation and disimpaction of bilateral ear cerumen per Dr. Redmond Baseman at [facility] for this coming Thursday, 01/30/2014 at 1:50 PM.  Will call the patient to give her this appointment information.

## 2014-01-27 NOTE — Assessment & Plan Note (Addendum)
Patient is complaining of dysuria, frequency, and hesitancy for the past 2-3 days.  She denies any recent fevers or chills.  She denies any GI symptoms whatsoever.  Patient has been taking AZO over the counter for the past 2-3 days-which will cause discoloration of her urine.  Urinalysis was negative for blood, 3-6 WBC; but was difficult to obtain an adequate urinalysis due to color interference.  Due to clinical symptoms of urinary tract infection-will go ahead and order an antibiotic for patient; and await urine culture results.

## 2014-01-27 NOTE — Assessment & Plan Note (Addendum)
Patient was diagnosed with sinusitis last week; and prescribed Augmentin twice daily for a total of 10 days.  Patient states that her facial and dental pain has completely resolved; and she no longer has sinusitis-type headaches.  She continues to complain of some excessive tearing; and some clear runny nose. She denies any recent fevers or chills.  Patient states she has approximately 3 days left of her Augmentin prescription to take.  Patient has been experiencing diarrhea since initiating the Augmentin. Since it does appear that patient's sinusitis symptoms are essentially resolved; and that her continued, chronic symptoms are more allergic-type symptoms-will advise patient to discontinue her Augmentin; and will call in a prescription for Flonase to try.  Also advised patient that excessive tearing could very well be related to her chemotherapy as well.  In addition, patient will be prescribed Cipro for UTI diagnosis.

## 2014-01-27 NOTE — Progress Notes (Signed)
SYMPTOM MANAGEMENT CLINIC   HPI: Jessica [name] 48 y.o. female diagnosed with breast cancer.  Currently undergoing neoadjuvant carboplatin/docetaxel/Herceptin/Perjeta chemotherapy regimen.  The plan is for the patient received a total of 6 cycles of chemotherapy; followed by definitive surgery; and then possible radiation therapy.  Patient returned to the [facility] today for followup.  She was seen last week as an urgent care visit for complaint of acute sinusitis symptoms.  She was initiated on Augmentin antibiotic at that time; and states that her sinusitis symptoms have greatly improved since that time.  She denies any headaches, facial pain, or dental pain now.  She still has some nasal congestion of; but her main complaints are a clear runny nose and some increased tearing of her bilateral eyes.  She denies any recent fevers or chills.  Also, patient was noted to have a excessive cerumen to bilateral ears on exam last week.  Patient has been using the he wax removal kit over the counter for the past several days with only minimal effectiveness.  She states that she has been using the cerumen softening oil; and flushing or years as directed.  She still feels as though her ears are blocked; is complaining of decreased hearing bilaterally. HPI  CURRENT THERAPY: Upcoming Treatment Dates - BREAST Docetaxel / Carboplatin reduced AUC q21d (Order Herceptin separately) Days with orders from any treatment category:  02/04/2014      SCHEDULING COMMUNICATION      ondansetron (ZOFRAN) IVPB 16 mg      Dexamethasone Sodium Phosphate (DECADRON) injection 20 mg      DOCEtaxel (TAXOTERE) 150 mg in dextrose 5 % 250 mL chemo infusion      CARBOplatin (PARAPLATIN) 720.5 mg in sodium chloride 0.9 % 250 mL chemo infusion      lidocaine-prilocaine (EMLA) cream      tobramycin-dexamethasone (TOBRADEX) ophthalmic solution      sodium chloride 0.9 % injection 10 mL      heparin lock flush 100 unit/mL   heparin lock flush 100 unit/mL      alteplase (CATHFLO ACTIVASE) injection 2 mg      sodium chloride 0.9 % injection 3 mL      Cold Pack 1 packet      diphenhydrAMINE (BENADRYL) injection 25 mg      famotidine (PEPCID) IVPB 20 mg      0.9 %  sodium chloride infusion      methylPREDNISolone sodium succinate (SOLU-MEDROL) 125 mg/2 mL injection 125 mg      EPINEPHrine (ADRENALIN) 0.1 MG/ML injection 0.25 mg      EPINEPHrine (ADRENALIN) 0.1 MG/ML injection 0.25 mg      EPINEPHrine (ADRENALIN) injection 0.5 mg      EPINEPHrine (ADRENALIN) injection 0.5 mg      diphenhydrAMINE (BENADRYL) injection 50 mg      albuterol (PROVENTIL) (2.5 MG/3ML) 0.083% nebulizer solution 2.5 mg      0.9 %  sodium chloride infusion      TREATMENT CONDITIONS 02/05/2014      SCHEDULING COMMUNICATION INJECTION      lidocaine-prilocaine (EMLA) cream      tobramycin-dexamethasone (TOBRADEX) ophthalmic solution      pegfilgrastim (NEULASTA) injection 6 mg 02/25/2014      SCHEDULING COMMUNICATION      ondansetron (ZOFRAN) IVPB 16 mg      Dexamethasone Sodium Phosphate (DECADRON) injection 20 mg      DOCEtaxel (TAXOTERE) 150 mg in dextrose 5 % 250 mL chemo infusion  CARBOplatin (PARAPLATIN) 720.5 mg in sodium chloride 0.9 % 250 mL chemo infusion      lidocaine-prilocaine (EMLA) cream      tobramycin-dexamethasone (TOBRADEX) ophthalmic solution      sodium chloride 0.9 % injection 10 mL      heparin lock flush 100 unit/mL      heparin lock flush 100 unit/mL      alteplase (CATHFLO ACTIVASE) injection 2 mg      sodium chloride 0.9 % injection 3 mL      Cold Pack 1 packet      diphenhydrAMINE (BENADRYL) injection 25 mg      famotidine (PEPCID) IVPB 20 mg      0.9 %  sodium chloride infusion      methylPREDNISolone sodium succinate (SOLU-MEDROL) 125 mg/2 mL injection 125 mg      EPINEPHrine (ADRENALIN) 0.1 MG/ML injection 0.25 mg      EPINEPHrine (ADRENALIN) 0.1 MG/ML injection 0.25 mg      EPINEPHrine  (ADRENALIN) injection 0.5 mg      EPINEPHrine (ADRENALIN) injection 0.5 mg      diphenhydrAMINE (BENADRYL) injection 50 mg      albuterol (PROVENTIL) (2.5 MG/3ML) 0.083% nebulizer solution 2.5 mg      0.9 %  sodium chloride infusion      TREATMENT CONDITIONS    ROS  Past Medical History  Diagnosis Date  . Anxiety   . COPD (chronic obstructive pulmonary disease) 2011    does not use inhaler ofter  . Depression   . Hot flashes   . Family history of anesthesia complication     Had a hard time waking father up only once after several procedures  . Shortness of breath     with exertion  . Heart murmur     PMH: at age 58; no longer have murmur  . Chronic heartburn   . PVC (premature ventricular contraction)     PMH;at 48 y.o.  . Cancer     DCIS  . Asthma     denies history of asthma    Past Surgical History  Procedure Laterality Date  . Polyp removal     . Tonsillectomy    . Wisdom tooth extraction    . Robotic assisted total hysterectomy with bilateral salpingo oopherectomy Bilateral 05/24/2013    Procedure: ROBOTIC ASSISTED TOTAL HYSTERECTOMY WITH BILATERAL SALPINGECTOMY;  Surgeon: Lavonia Drafts, MD;  Location: [facility];  Service: Gynecology;  Laterality: Bilateral;  . Cystoscopy N/A 05/24/2013    Procedure: CYSTOSCOPY;  Surgeon: Lavonia Drafts, MD;  Location: [facility];  Service: Gynecology;  Laterality: N/A;  . Abdominal hysterectomy    . Breast surgery      left breast biopsy x 2  . Portacath placement Right 01/02/2014    Procedure: INSERTION PORT-A-CATH;  Surgeon: [name], MD;  Location: [facility];  Service: General;  Laterality: Right;    has HYPERTRIGLYCERIDEMIA; TOBACCO ABUSE; CHRONIC OBSTRUCTIVE PULMONARY DISEASE; OVARIAN CYST; MENORRHAGIA; ELEVATED BLOOD PRESSURE WITHOUT DIAGNOSIS OF HYPERTENSION; Postoperative infection; Breast cancer of upper-outer quadrant of left female breast; Sinusitis; Acute sinusitis; Rash; Excessive cerumen in both ear  canals; Oral mucositis due to antineoplastic therapy; UTI (urinary tract infection); Cerumen impaction; and Diarrhea on her problem list.     has No Known Allergies.    Medication List       This list is accurate as of: 01/27/14  2:55 PM.  Always use your most recent med list.  budesonide-formoterol 160-4.5 MCG/ACT inhaler  Commonly known as:  SYMBICORT  Inhale 1 puff into the lungs 2 (two) times daily.     calcium carbonate 500 MG chewable tablet  Commonly known as:  TUMS - dosed in mg elemental calcium  Chew 1 tablet by mouth daily.     ciprofloxacin 500 MG tablet  Commonly known as:  CIPRO  Take 1 tablet (500 mg total) by mouth 2 (two) times daily.     dexamethasone 4 MG tablet  Commonly known as:  DECADRON  Take 2 tablets (8 mg total) by mouth 2 (two) times daily. Start the day before Taxotere. Then again the day after chemo for 3 days.     fluconazole 100 MG tablet  Commonly known as:  DIFLUCAN  Take 1 tablet (100 mg total) by mouth daily.     fluticasone 50 MCG/ACT nasal spray  Commonly known as:  FLONASE  Place 2 sprays into both nostrils daily.     gabapentin 600 MG tablet  Commonly known as:  NEURONTIN  Take 600 mg by mouth at bedtime as needed (anxiety).     HYDROcodone-acetaminophen 5-325 MG per tablet  Commonly known as:  NORCO/VICODIN  Take 1-2 tablets by mouth every 6 (six) hours as needed for moderate pain.     ibuprofen 800 MG tablet  Commonly known as:  ADVIL,MOTRIN  Take 800 mg by mouth every 8 (eight) hours as needed (pain).     ipratropium 17 MCG/ACT inhaler  Commonly known as:  ATROVENT HFA  Inhale 1 puff into the lungs every 6 (six) hours as needed for wheezing (SOB).     lidocaine-prilocaine cream  Commonly known as:  EMLA  Apply 1 application topically as needed. Apply over port site 1-2 hours before chemotherapy, cover with plastic wrap     loratadine 10 MG tablet  Commonly known as:  CLARITIN  Take 1 tablet (10 mg  total) by mouth as needed.     LORazepam 0.5 MG tablet  Commonly known as:  ATIVAN  Take 1 tablet (0.5 mg total) by mouth 2 (two) times daily as needed for anxiety.     LORazepam 0.5 MG tablet  Commonly known as:  ATIVAN  Take 1 tablet (0.5 mg total) by mouth at bedtime as needed (Nausea or vomiting).     nystatin 100000 UNIT/ML suspension  Commonly known as:  MYCOSTATIN  Take 5 mLs (500,000 Units total) by mouth 4 (four) times daily as needed (Swish and spit).     ondansetron 8 MG tablet  Commonly known as:  ZOFRAN  Take 1 tablet (8 mg total) by mouth 2 (two) times daily. Start the day after chemo for 3 days. Then take as needed for nausea or vomiting.     prochlorperazine 10 MG tablet  Commonly known as:  COMPAZINE  Take 1 tablet (10 mg total) by mouth every 6 (six) hours as needed (Nausea or vomiting).     tobramycin-dexamethasone ophthalmic solution  Commonly known as:  TOBRADEX  Place 1 drop into both eyes 2 (two) times daily.         PHYSICAL EXAMINATION  Blood pressure 143/86, pulse 77, temperature 98.4 F (36.9 C), temperature source Oral, resp. rate 18, height 5' 5"  (1.651 m), weight 196 lb 11.2 oz (89.223 kg), last menstrual period 03/18/2013.  Physical Exam  LABORATORY DATA:. Orders Only on 01/27/2014  Component Date Value Ref Range Status  . Glucose 01/27/2014 Negative  Negative mg/dL Final  . Bilirubin (Urine)  01/27/2014 Color Interference  Negative Final  . Ketones 01/27/2014 Color Interference  Negative mg/dL Final  . Specific Gravity, Urine 01/27/2014 1.030  1.003 - 1.035 Final  . Blood 01/27/2014 Negative  Negative Final  . pH 01/27/2014 5.0  4.6 - 8.0 Final  . Protein 01/27/2014 Color Interference  Negative- <30 mg/dL Final  . Urobilinogen, UR 01/27/2014 Color Interference  0.2 - 1 mg/dL Final  . Nitrite 01/27/2014 Color Interference  Negative Final  . Leukocyte Esterase 01/27/2014 Color Interference  Negative Final  . RBC / HPF 01/27/2014 0-2  0 - 2  Final  . WBC, UA 01/27/2014 3-6  0 - 2 Final  . Bacteria, UA 01/27/2014 Few  Negative- Trace Final  . Epithelial Cells 01/27/2014 Occasional  Negative- Few Final     RADIOGRAPHIC STUDIES: No results found.  ASSESSMENT/PLAN:    Breast cancer of upper-outer quadrant of left female breast  Assessment & Plan Patient received cycle one of her neoadjuvant carboplatin/docetaxel/Herceptin/Perjeta chemotherapy on 01/14/2014.  She received Neulasta injection for growth factor support on 01/15/2014.  She'll continue to be seen on days #1 and 8 of each cycle of chemotherapy for labs and follow up visit as well.  She'll be due for cycle 2 of the same regimen on 02/04/2014.   Sinusitis  Assessment & Plan Patient was diagnosed with sinusitis last week; and prescribed Augmentin twice daily for a total of 10 days.  Patient states that her facial and dental pain has completely resolved; and she no longer has sinusitis-type headaches.  She continues to complain of some excessive tearing; and some clear runny nose. She denies any recent fevers or chills.  Patient states she has approximately 3 days left of her Augmentin prescription to take.  Patient has been experiencing diarrhea since initiating the Augmentin. Since it does appear that patient's sinusitis symptoms are essentially resolved; and that her continued, chronic symptoms are more allergic-type symptoms-will advise patient to discontinue her Augmentin; and will call in a prescription for Flonase to try.  Also advised patient that excessive tearing could very well be related to her chemotherapy as well.  In addition, patient will be prescribed Cipro for UTI diagnosis.     Oral mucositis due to antineoplastic therapy  Assessment & Plan Patient states that her mucositis is essentially resolved.  She no longer has any white coating to her tongue.  I she continues to use the nystatin swish and spit up for mild thrush on intermittent basis.   UTI  (urinary tract infection)  Assessment & Plan Patient is complaining of dysuria, frequency, and hesitancy for the past 2-3 days.  She denies any recent fevers or chills.  She denies any GI symptoms whatsoever.  Patient has been taking AZO over the counter for the past 2-3 days-which will cause discoloration of her urine.  Urinalysis was negative for blood, 3-6 WBC; but was difficult to obtain an adequate urinalysis due to color interference.  Due to clinical symptoms of urinary tract infection-will go ahead and order an antibiotic for patient; and await urine culture results.   Cerumen impaction  Assessment & Plan Patient was noted to have significant cerumen to bilateral ears on exam last week.  Patient obtained earwax removal kit over-the-counter; and has been using cerumen softening oil drops to her bilateral ears for the past few days.  She's also been trying to flush her ears with minimal effectiveness.  Attempted to manually disimpact bilat ears; but unable to do so effectively today. Patient states  that she will continue to use oil drops to bilat ears; and attempt to flush at home.  Obtained an ENT referral appointment for evaluation and disimpaction of bilateral ear cerumen per Dr. Redmond Baseman at [facility] for this coming Thursday, 01/30/2014 at 1:50 PM.  Will call the patient to give her this appointment information.     Diarrhea  Assessment & Plan Most likely, antibioitic-induced diarrhea.  Advised Immodium OTC as directed.     Patient stated understanding of all instructions; and was in agreement with this plan of care. The patient knows to call the clinic with any problems, questions or concerns.   Review/collaboration with Dr.  Jana Hakim regarding all aspects of patient's visit today.   Total time spent with patient was  40 minutes;  with greater than  80 percent of that time spent in face to face counseling regarding  her symptoms, attempting disimpaction of bilateral your cerumen,  and coordination of care and follow up.  Disclaimer: This note was dictated with voice recognition software. Similar sounding words can inadvertently be transcribed and may not be corrected upon review.   Drue Second, NP 01/27/2014

## 2014-01-27 NOTE — Assessment & Plan Note (Signed)
Patient states that her mucositis is essentially resolved.  She no longer has any white coating to her tongue.  I she continues to use the nystatin swish and spit up for mild thrush on intermittent basis.

## 2014-01-27 NOTE — Assessment & Plan Note (Signed)
Patient received cycle one of her neoadjuvant carboplatin/docetaxel/Herceptin/Perjeta chemotherapy on 01/14/2014.  She received Neulasta injection for growth factor support on 01/15/2014.  She'll continue to be seen on days #1 and 8 of each cycle of chemotherapy for labs and follow up visit as well.  She'll be due for cycle 2 of the same regimen on 02/04/2014.

## 2014-01-27 NOTE — Assessment & Plan Note (Signed)
Most likely, antibioitic-induced diarrhea.  Advised Immodium OTC as directed.

## 2014-01-28 LAB — URINE CULTURE

## 2014-01-31 ENCOUNTER — Other Ambulatory Visit: Payer: Medicaid Other

## 2014-01-31 ENCOUNTER — Ambulatory Visit: Payer: Self-pay | Admitting: Oncology

## 2014-02-03 ENCOUNTER — Other Ambulatory Visit: Payer: Self-pay | Admitting: *Deleted

## 2014-02-03 DIAGNOSIS — C50412 Malignant neoplasm of upper-outer quadrant of left female breast: Secondary | ICD-10-CM

## 2014-02-04 ENCOUNTER — Ambulatory Visit: Payer: Medicaid Other

## 2014-02-04 ENCOUNTER — Telehealth: Payer: Self-pay | Admitting: *Deleted

## 2014-02-04 ENCOUNTER — Ambulatory Visit (HOSPITAL_BASED_OUTPATIENT_CLINIC_OR_DEPARTMENT_OTHER): Payer: Medicaid Other | Admitting: Nurse Practitioner

## 2014-02-04 ENCOUNTER — Telehealth: Payer: Self-pay | Admitting: Nurse Practitioner

## 2014-02-04 ENCOUNTER — Ambulatory Visit (HOSPITAL_BASED_OUTPATIENT_CLINIC_OR_DEPARTMENT_OTHER): Payer: Medicaid Other

## 2014-02-04 ENCOUNTER — Other Ambulatory Visit (HOSPITAL_BASED_OUTPATIENT_CLINIC_OR_DEPARTMENT_OTHER): Payer: Medicaid Other

## 2014-02-04 ENCOUNTER — Other Ambulatory Visit: Payer: Self-pay | Admitting: Oncology

## 2014-02-04 ENCOUNTER — Other Ambulatory Visit: Payer: Self-pay | Admitting: *Deleted

## 2014-02-04 ENCOUNTER — Encounter: Payer: Self-pay | Admitting: Nurse Practitioner

## 2014-02-04 VITALS — BP 121/62 | HR 76 | Temp 98.7°F | Resp 18 | Ht 65.0 in | Wt 198.6 lb

## 2014-02-04 DIAGNOSIS — IMO0001 Reserved for inherently not codable concepts without codable children: Secondary | ICD-10-CM

## 2014-02-04 DIAGNOSIS — K219 Gastro-esophageal reflux disease without esophagitis: Secondary | ICD-10-CM

## 2014-02-04 DIAGNOSIS — N390 Urinary tract infection, site not specified: Secondary | ICD-10-CM

## 2014-02-04 DIAGNOSIS — C50412 Malignant neoplasm of upper-outer quadrant of left female breast: Secondary | ICD-10-CM

## 2014-02-04 DIAGNOSIS — J019 Acute sinusitis, unspecified: Secondary | ICD-10-CM

## 2014-02-04 DIAGNOSIS — Z5112 Encounter for antineoplastic immunotherapy: Secondary | ICD-10-CM

## 2014-02-04 DIAGNOSIS — Z5111 Encounter for antineoplastic chemotherapy: Secondary | ICD-10-CM

## 2014-02-04 DIAGNOSIS — Z95828 Presence of other vascular implants and grafts: Secondary | ICD-10-CM

## 2014-02-04 LAB — COMPREHENSIVE METABOLIC PANEL (CC13)
ALT: 28 U/L (ref 0–55)
ANION GAP: 11 meq/L (ref 3–11)
AST: 13 U/L (ref 5–34)
Albumin: 3.9 g/dL (ref 3.5–5.0)
Alkaline Phosphatase: 75 U/L (ref 40–150)
BUN: 12.3 mg/dL (ref 7.0–26.0)
CO2: 20 meq/L — AB (ref 22–29)
Calcium: 10 mg/dL (ref 8.4–10.4)
Chloride: 108 mEq/L (ref 98–109)
Creatinine: 0.8 mg/dL (ref 0.6–1.1)
Glucose: 150 mg/dl — ABNORMAL HIGH (ref 70–140)
POTASSIUM: 4.1 meq/L (ref 3.5–5.1)
SODIUM: 138 meq/L (ref 136–145)
TOTAL PROTEIN: 6.9 g/dL (ref 6.4–8.3)
Total Bilirubin: 0.6 mg/dL (ref 0.20–1.20)

## 2014-02-04 LAB — CBC WITH DIFFERENTIAL/PLATELET
BASO%: 0.1 % (ref 0.0–2.0)
Basophils Absolute: 0 10*3/uL (ref 0.0–0.1)
EOS%: 0 % (ref 0.0–7.0)
Eosinophils Absolute: 0 10*3/uL (ref 0.0–0.5)
HCT: 38.1 % (ref 34.8–46.6)
HGB: 13.3 g/dL (ref 11.6–15.9)
LYMPH%: 4.3 % — ABNORMAL LOW (ref 14.0–49.7)
MCH: 32.8 pg (ref 25.1–34.0)
MCHC: 34.9 g/dL (ref 31.5–36.0)
MCV: 93.8 fL (ref 79.5–101.0)
MONO#: 0.1 10*3/uL (ref 0.1–0.9)
MONO%: 0.5 % (ref 0.0–14.0)
NEUT%: 95.1 % — ABNORMAL HIGH (ref 38.4–76.8)
NEUTROS ABS: 11.6 10*3/uL — AB (ref 1.5–6.5)
PLATELETS: 189 10*3/uL (ref 145–400)
RBC: 4.06 10*6/uL (ref 3.70–5.45)
RDW: 13.7 % (ref 11.2–14.5)
WBC: 12.2 10*3/uL — AB (ref 3.9–10.3)
lymph#: 0.5 10*3/uL — ABNORMAL LOW (ref 0.9–3.3)

## 2014-02-04 MED ORDER — HEPARIN SOD (PORK) LOCK FLUSH 100 UNIT/ML IV SOLN
500.0000 [IU] | Freq: Once | INTRAVENOUS | Status: AC | PRN
  Administered 2014-02-04: 500 [IU]
  Filled 2014-02-04: qty 5

## 2014-02-04 MED ORDER — TOBRAMYCIN-DEXAMETHASONE 0.3-0.1 % OP SUSP: 1.0000 [drp] | Freq: Two times a day (BID) | OPHTHALMIC | Status: DC

## 2014-02-04 MED ORDER — OMEPRAZOLE 40 MG PO CPDR: 40.0000 mg | DELAYED_RELEASE_CAPSULE | Freq: Every day | ORAL | Status: DC

## 2014-02-04 MED ORDER — DOCETAXEL CHEMO INJECTION 160 MG/16ML
75.0000 mg/m2 | Freq: Once | INTRAVENOUS | Status: AC
  Administered 2014-02-04: 150 mg via INTRAVENOUS
  Filled 2014-02-04: qty 15

## 2014-02-04 MED ORDER — ACETAMINOPHEN 325 MG PO TABS
ORAL_TABLET | ORAL | Status: AC
  Filled 2014-02-04: qty 2

## 2014-02-04 MED ORDER — SODIUM CHLORIDE 0.9 % IJ SOLN
10.0000 mL | INTRAMUSCULAR | Status: DC | PRN
  Administered 2014-02-04: 10 mL via INTRAVENOUS
  Filled 2014-02-04: qty 10

## 2014-02-04 MED ORDER — ONDANSETRON 16 MG/50ML IVPB (CHCC)
INTRAVENOUS | Status: AC
  Filled 2014-02-04: qty 16

## 2014-02-04 MED ORDER — HYDROCODONE-ACETAMINOPHEN 5-325 MG PO TABS: 1.0000 | ORAL_TABLET | Freq: Four times a day (QID) | ORAL | Status: DC | PRN

## 2014-02-04 MED ORDER — DIPHENHYDRAMINE HCL 25 MG PO CAPS
ORAL_CAPSULE | ORAL | Status: AC
Start: 2014-02-04 — End: 2014-02-04
  Filled 2014-02-04: qty 1

## 2014-02-04 MED ORDER — ONDANSETRON 16 MG/50ML IVPB (CHCC)
16.0000 mg | Freq: Once | INTRAVENOUS | Status: AC
Start: 2014-02-04 — End: 2014-02-04
  Administered 2014-02-04: 16 mg via INTRAVENOUS

## 2014-02-04 MED ORDER — SODIUM CHLORIDE 0.9 % IV SOLN
720.0000 mg | Freq: Once | INTRAVENOUS | Status: AC
  Administered 2014-02-04: 720 mg via INTRAVENOUS
  Filled 2014-02-04: qty 72

## 2014-02-04 MED ORDER — DIPHENHYDRAMINE HCL 25 MG PO CAPS
25.0000 mg | ORAL_CAPSULE | Freq: Once | ORAL | Status: AC
  Administered 2014-02-04: 25 mg via ORAL

## 2014-02-04 MED ORDER — LORAZEPAM 0.5 MG PO TABS: 0.5000 mg | ORAL_TABLET | Freq: Every evening | ORAL | Status: DC | PRN

## 2014-02-04 MED ORDER — SODIUM CHLORIDE 0.9 % IJ SOLN
10.0000 mL | INTRAMUSCULAR | Status: DC | PRN
  Administered 2014-02-04: 10 mL
  Filled 2014-02-04: qty 10

## 2014-02-04 MED ORDER — ACETAMINOPHEN 325 MG PO TABS
650.0000 mg | ORAL_TABLET | Freq: Once | ORAL | Status: AC
  Administered 2014-02-04: 650 mg via ORAL

## 2014-02-04 MED ORDER — TRASTUZUMAB CHEMO INJECTION 440 MG
6.0000 mg/kg | Freq: Once | INTRAVENOUS | Status: AC
  Administered 2014-02-04: 525 mg via INTRAVENOUS
  Filled 2014-02-04: qty 25

## 2014-02-04 MED ORDER — DEXAMETHASONE SODIUM PHOSPHATE 20 MG/5ML IJ SOLN
INTRAMUSCULAR | Status: AC
  Filled 2014-02-04: qty 5

## 2014-02-04 MED ORDER — DEXAMETHASONE SODIUM PHOSPHATE 20 MG/5ML IJ SOLN
20.0000 mg | Freq: Once | INTRAMUSCULAR | Status: AC
  Administered 2014-02-04: 20 mg via INTRAVENOUS

## 2014-02-04 MED ORDER — SODIUM CHLORIDE 0.9 % IV SOLN
Freq: Once | INTRAVENOUS | Status: AC
  Administered 2014-02-04: 11:00:00 via INTRAVENOUS

## 2014-02-04 MED ORDER — SODIUM CHLORIDE 0.9 % IV SOLN
420.0000 mg | Freq: Once | INTRAVENOUS | Status: AC
  Administered 2014-02-04: 420 mg via INTRAVENOUS
  Filled 2014-02-04: qty 14

## 2014-02-04 NOTE — Telephone Encounter (Signed)
per pof to sch pt appt-gave pt copy of sch °

## 2014-02-04 NOTE — Telephone Encounter (Signed)
sch appt-sent MW emai to sch trmt-pt will get updated copy in am on 10/21

## 2014-02-04 NOTE — Patient Instructions (Signed)
[facility] Discharge Instructions for Patients Receiving Chemotherapy  Today you received the following chemotherapy agents Herceptin, Prejeta, Taxotere and Carboplatin  To help prevent nausea and vomiting after your treatment, we encourage you to take your nausea medication as prescribed.   If you develop nausea and vomiting that is not controlled by your nausea medication, call the clinic.   BELOW ARE SYMPTOMS THAT SHOULD BE REPORTED IMMEDIATELY:  *FEVER GREATER THAN 100.5 F  *CHILLS WITH OR WITHOUT FEVER  NAUSEA AND VOMITING THAT IS NOT CONTROLLED WITH YOUR NAUSEA MEDICATION  *UNUSUAL SHORTNESS OF BREATH  *UNUSUAL BRUISING OR BLEEDING  TENDERNESS IN MOUTH AND THROAT WITH OR WITHOUT PRESENCE OF ULCERS  *URINARY PROBLEMS  *BOWEL PROBLEMS  UNUSUAL RASH Items with * indicate a potential emergency and should be followed up as soon as possible.  Feel free to call the clinic you have any questions or concerns. The clinic phone number is [phone].

## 2014-02-04 NOTE — Patient Instructions (Signed)

## 2014-02-04 NOTE — Progress Notes (Signed)
Jessica [name]  [phone] [phone]     ID: MARKASIA CARROL DOB: 1965/10/07  MR#: [id number]  JGO#:[id number]  Patient Care Team: [name], MD as PCP - General (Nurse Practitioner) [name], MD as Consulting Physician (General Surgery) [name], MD as Consulting Physician (Oncology) [name], MD as Consulting Physician (Radiation Oncology)  CHIEF COMPLAINT: Newly diagnosed triple positive breast cancer CURRENT TREATMENT: neoadjuvant chemo-immunotherapy  BREAST CANCER HISTORY: From the original intake note:  Jessica [name] herself palpated a mass in her left breast. She brought it to the attention of [name] at the health department and she was set up for unilateral left mammography and ultrasound 11/27/2013 at the [facility]. This showed a possible distortion in the left breast upper outer quadrant. However the patient's breasts are density category D. On exam there was a firm nodule or mass in the left breast 2:00 location which by ultrasound was irregular and hypoechoic and measured 1.5 cm. There was no left axillary lymphadenopathy noted.  Biopsy of the mass in question 11/29/2013 showed ([id number]) an invasive ductal carcinoma, grade 1, estrogen receptor 94% positive, progesterone receptor 94% positive, both with strong staining intensity, with an MIB-1 of 46%, and HER-2 amplified by CISH, the signals ratio of 4.85 and the number per cell 6.55.  : 12/08/2013 the patient underwent bilateral breast MRI. This showed in the left breast upper outer quadrant a 2.9 cm irregular enhancing mass and extending posteriorly from this an area of clumped non-masslike enhancement extending approximately 6 cm and leading to a posterior group of nodules which were small but measured in aggregate 2.2 cm. In addition, there was a separate 0.8 cm irregular spiculated enhancing mass in the upper inner aspect of the left breast. In the  lower outer left breast there was a 0.8 cm oval enhancing mass. There were no abnormal appearing lymph nodes.  [name] subsequent history is as detailed below  INTERVAL HISTORY: Jessica [name] returns today for follow up of her breast cancer, accompanied by her husband, [name]. Today is day 1, cycle 1 of 6 planned cycles of carboplatin, docetaxel, pertuzumab, and trastuzumab to be given every 3 weeks. [name] is recovering from a sinus infection, and visited with our symptom management NP regarding urinary tract infection-like symptoms. She is feeling much better and only has a few doses of cipro left. Her headaches continue, but they are much improved.   REVIEW OF SYSTEMS: [name] denies fevers, chills, nausea, or vomiting. She had some diarrhea after her initial course of antibiotics but this has resolved. She has severe heartburn, and her tums are not helping. She is somewhat fatigued and is short of breath with exertion, but denies chest pain, palpitations, or cough. The carpel tunnel in her right hand continues but is is no worse, she denies numbness or tingling to the left hand or either foot. She continues the tobradex eye drops for her excessive tearing, and this has improved. She has no more oral mucositis or thrush. She recently had her ears disimpacted by Dr. Redmond Baseman of [facility] as they were filled with cerumen. A detailed review of systems is otherwise noncontributory.   PAST MEDICAL HISTORY: Past Medical History  Diagnosis Date  . Anxiety   . COPD (chronic obstructive pulmonary disease) 2011    does not use inhaler ofter  . Depression   . Hot flashes   . Family history of anesthesia complication     Had a hard time waking father  up only once after several procedures  . Shortness of breath     with exertion  . Heart murmur     PMH: at age 66; no longer have murmur  . Chronic heartburn   . PVC (premature ventricular contraction)     PMH;at 48 y.o.  . Cancer     DCIS  . Asthma      denies history of asthma    PAST SURGICAL HISTORY: Past Surgical History  Procedure Laterality Date  . Polyp removal     . Tonsillectomy    . Wisdom tooth extraction    . Robotic assisted total hysterectomy with bilateral salpingo oopherectomy Bilateral 05/24/2013    Procedure: ROBOTIC ASSISTED TOTAL HYSTERECTOMY WITH BILATERAL SALPINGECTOMY;  Surgeon: Lavonia Drafts, MD;  Location: [facility];  Service: Gynecology;  Laterality: Bilateral;  . Cystoscopy N/A 05/24/2013    Procedure: CYSTOSCOPY;  Surgeon: Lavonia Drafts, MD;  Location: [facility];  Service: Gynecology;  Laterality: N/A;  . Abdominal hysterectomy    . Breast surgery      left breast biopsy x 2  . Portacath placement Right 01/02/2014    Procedure: INSERTION PORT-A-CATH;  Surgeon: [name], MD;  Location: [facility];  Service: General;  Laterality: Right;    FAMILY HISTORY Family History  Problem Relation Age of Onset  . Heart disease Mother   . Hypercholesterolemia Mother   . Heart disease Father   . COPD Father   . Hypercholesterolemia Father   . Cancer Neg Hx   [name] parents are still living, her father is 83 years old and her mother 19 years old as of August 2015. The patient had no brothers, one sister. There is no history of breast or ovarian cancer in the family.  GYNECOLOGIC HISTORY:  Patient's last menstrual period was 03/18/2013. Menarche age 83. She is GX P0. She took oral contraceptives for many years as well as Depo Provera shots. She is status post hysterectomy with bilateral salpingectomy. 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 She scans at work, and she also does all this she'll vein and stocking. She is single but for the last 11 years as lives with her significant other [name]. He is disabled secondary to multiple orthopedic problems including bilateral avascular necrosis of the hips. Both of them do smoke. The patient is here for  drinks on weekends    ADVANCED DIRECTIVES: Not in place    HEALTH MAINTENANCE: History  Substance Use Topics  . Smoking status: Current Every Day Smoker -- 1.00 packs/day for 16 years    Types: Cigarettes  . Smokeless tobacco: Never Used  . Alcohol Use: Yes     Comment: occasional      Colonoscopy:  PAP: January 2015   Bone density:  Lipid panel:  No Known Allergies  Current Outpatient Prescriptions  Medication Sig Dispense Refill  . ciprofloxacin (CIPRO) 500 MG tablet Take 1 tablet (500 mg total) by mouth 2 (two) times daily.  14 tablet  0  . dexamethasone (DECADRON) 4 MG tablet Take 2 tablets (8 mg total) by mouth 2 (two) times daily. Start the day before Taxotere. Then again the day after chemo for 3 days.  30 tablet  1  . fluconazole (DIFLUCAN) 100 MG tablet Take 1 tablet (100 mg total) by mouth daily.  7 tablet  0  . fluticasone (FLONASE) 50 MCG/ACT nasal spray Place 2 sprays into both nostrils daily.  16 g  2  . gabapentin (NEURONTIN) 600 MG tablet Take 600 mg by mouth at bedtime as needed (anxiety).       Marland Kitchen ipratropium (ATROVENT HFA) 17 MCG/ACT inhaler Inhale 1 puff into the lungs every 6 (six) hours as needed for wheezing (SOB).       Marland Kitchen lidocaine-prilocaine (EMLA) cream Apply 1 application topically as needed. Apply over port site 1-2 hours before chemotherapy, cover with plastic wrap  30 g  0  . loratadine (CLARITIN) 10 MG tablet Take 1 tablet (10 mg total) by mouth as needed.  30 tablet  1  . ondansetron (ZOFRAN) 8 MG tablet Take 1 tablet (8 mg total) by mouth 2 (two) times daily. Start the day after chemo for 3 days. Then take as needed for nausea or vomiting.  30 tablet  1  . prochlorperazine (COMPAZINE) 10 MG tablet Take 1 tablet (10 mg total) by mouth every 6 (six) hours as needed (Nausea or vomiting).  30 tablet  1  . budesonide-formoterol (SYMBICORT) 160-4.5 MCG/ACT inhaler Inhale 1 puff into the lungs 2 (two) times daily.       . calcium carbonate (TUMS - DOSED IN  MG ELEMENTAL CALCIUM) 500 MG chewable tablet Chew 1 tablet by mouth daily.      Marland Kitchen HYDROcodone-acetaminophen (NORCO/VICODIN) 5-325 MG per tablet Take 1-2 tablets by mouth every 6 (six) hours as needed for moderate pain.  30 tablet  0  . ibuprofen (ADVIL,MOTRIN) 800 MG tablet Take 800 mg by mouth every 8 (eight) hours as needed (pain).      . LORazepam (ATIVAN) 0.5 MG tablet Take 1 tablet (0.5 mg total) by mouth at bedtime as needed (Nausea or vomiting).  30 tablet  0  . nystatin (MYCOSTATIN) 100000 UNIT/ML suspension Take 5 mLs (500,000 Units total) by mouth 4 (four) times daily as needed (Swish and spit).  60 mL  0  . omeprazole (PRILOSEC) 40 MG capsule Take 1 capsule (40 mg total) by mouth daily.  30 capsule  2  . tobramycin-dexamethasone (TOBRADEX) ophthalmic solution Place 1 drop into both eyes 2 (two) times daily.  5 mL  0   No current facility-administered medications for this visit.    OBJECTIVE: middle-aged white woman in no acute distress Filed Vitals:   02/04/14 1005  BP: 121/62  Pulse: 76  Temp: 98.7 F (37.1 C)  Resp: 18     Body mass index is 33.05 kg/(m^2).    ECOG FS:1 - Symptomatic but completely ambulatory  Skin: warm, dry  HEENT: sclerae anicteric, conjunctivae pink, oropharynx clear. No thrush or mucositis.  Lymph Nodes: No cervical or supraclavicular lymphadenopathy  Lungs: clear to auscultation bilaterally, no rales, wheezes, or rhonci  Heart: regular rate and rhythm  Abdomen: round, soft, non tender, positive bowel sounds  Musculoskeletal: No focal spinal tenderness, no peripheral edema  Neuro: non focal, well oriented, positive affect  Breasts: deferred   LAB RESULTS:  CMP     Component Value Date/Time   NA 138 02/04/2014 0937   NA 139 12/31/2013 1448   K 4.1 02/04/2014 0937   K 4.6 12/31/2013 1448   CL 103 12/31/2013 1448   CO2 20* 02/04/2014 0937   CO2 24 12/31/2013 1448   GLUCOSE 150* 02/04/2014 0937   GLUCOSE 93 12/31/2013 1448   BUN 12.3 02/04/2014  0937   BUN 9 12/31/2013 1448   CREATININE 0.8 02/04/2014 0937   CREATININE 0.83 12/31/2013 1448   CALCIUM 10.0 02/04/2014 2595  CALCIUM 8.8 12/31/2013 1448   PROT 6.9 02/04/2014 0937   PROT 7.1 06/04/2013 1511   ALBUMIN 3.9 02/04/2014 0937   ALBUMIN 3.4* 06/04/2013 1511   AST 13 02/04/2014 0937   AST 12 06/04/2013 1511   ALT 28 02/04/2014 0937   ALT 14 06/04/2013 1511   ALKPHOS 75 02/04/2014 0937   ALKPHOS 70 06/04/2013 1511   BILITOT 0.60 02/04/2014 0937   BILITOT 0.5 06/04/2013 1511   GFRNONAA 83* 12/31/2013 1448   GFRAA >90 12/31/2013 1448    I No results found for this basename: SPEP,  UPEP,   kappa and lambda light chains    Lab Results  Component Value Date   WBC 12.2* 02/04/2014   NEUTROABS 11.6* 02/04/2014   HGB 13.3 02/04/2014   HCT 38.1 02/04/2014   MCV 93.8 02/04/2014   PLT 189 02/04/2014      Chemistry      Component Value Date/Time   NA 138 02/04/2014 0937   NA 139 12/31/2013 1448   K 4.1 02/04/2014 0937   K 4.6 12/31/2013 1448   CL 103 12/31/2013 1448   CO2 20* 02/04/2014 0937   CO2 24 12/31/2013 1448   BUN 12.3 02/04/2014 0937   BUN 9 12/31/2013 1448   CREATININE 0.8 02/04/2014 0937   CREATININE 0.83 12/31/2013 1448      Component Value Date/Time   CALCIUM 10.0 02/04/2014 0937   CALCIUM 8.8 12/31/2013 1448   ALKPHOS 75 02/04/2014 0937   ALKPHOS 70 06/04/2013 1511   AST 13 02/04/2014 0937   AST 12 06/04/2013 1511   ALT 28 02/04/2014 0937   ALT 14 06/04/2013 1511   BILITOT 0.60 02/04/2014 0937   BILITOT 0.5 06/04/2013 1511       No results found for this basename: LABCA2    No components found with this basename: LABCA125    No results found for this basename: INR,  in the last 168 hours  Urinalysis    Component Value Date/Time   COLORURINE YELLOW 06/04/2013 1420   APPEARANCEUR HAZY* 06/04/2013 1420   LABSPEC 1.030 01/27/2014 1350   LABSPEC >1.030* 06/04/2013 1420   PHURINE 6.0 06/04/2013 1420   GLUCOSEU Negative 01/27/2014 1350   GLUCOSEU  NEGATIVE 06/04/2013 1420   HGBUR MODERATE* 06/04/2013 1420   HGBUR negative 10/07/2009 1044   BILIRUBINUR NEGATIVE 06/04/2013 1420   KETONESUR NEGATIVE 06/04/2013 1420   PROTEINUR NEGATIVE 06/04/2013 1420   UROBILINOGEN Color Interference 01/27/2014 1350   UROBILINOGEN 0.2 06/04/2013 1420   NITRITE NEGATIVE 06/04/2013 1420   LEUKOCYTESUR NEGATIVE 06/04/2013 1420    STUDIES: No results found.  ASSESSMENT: 48 y.o. BRCA negative [place] woman status post left breast upper outer quadrant biopsy 11/29/2013 for a clinical T2 N0, stage IIA invasive ductal carcinoma, grade 1, triple positive, with an MIB-1 of 46%.  (1) MRI-guided biopsy of two additional areas in the left breast showed only tubular adenomas, no malignancy.   (2) neoadjuvant chemotherapy to start 01/14/2014, consisting of carboplatin, docetaxel, trastuzumab and pertuzumab given every 21 days for 6 cycles, with Neulasta on day 2-- echocardiogram 12/19/2013 showed an ejection fraction in the 50-55% range.  (3) trastuzumab to be continued to the total one year, through September of 2016  (4) definitive surgery to follow chemotherapy  (5) radiation to follow surgery  (6) antiestrogen therapy to follow radiation  PLAN: Besides the acute sinus and urinary tract infections, Kathren is faring well. The labs were reviewed in detail and were entirely stable. We will proceed  with day 1, cycle 2 of carboplatin, docetaxel, trastuzumab, and pertuzumab today, with the neulasta injection tomorrow. She will complete her course of antibiotics, and call us if her symptoms recur. I am starting her on omeprazole 66m daily for her reflux symptoms.  LAdeleewill return next week for a follow up visit. Cycle 3 will begin the week after that. She understands and agrees with this plan. She knows the goal of treatment in her case is cure. She has been encouraged to call with any issues that might arise before her next visit here.       FMarcelino Duster NP    02/04/2014 10:44 AM

## 2014-02-04 NOTE — Telephone Encounter (Signed)
Called and spoke with [place] to cancel prescriptions for Tobradex drops and Prilosec sent today. Pt just got grant money and would like prescriptions sent to [facility]. Scripts sent there and pt notified.

## 2014-02-04 NOTE — Telephone Encounter (Signed)
Per staff message and POF I have scheduled appts. Advised scheduler of appts. JMW  

## 2014-02-05 ENCOUNTER — Other Ambulatory Visit: Payer: Self-pay | Admitting: Oncology

## 2014-02-05 ENCOUNTER — Ambulatory Visit (HOSPITAL_BASED_OUTPATIENT_CLINIC_OR_DEPARTMENT_OTHER): Payer: Medicaid Other

## 2014-02-05 VITALS — BP 128/62 | HR 68 | Temp 97.9°F

## 2014-02-05 DIAGNOSIS — C50412 Malignant neoplasm of upper-outer quadrant of left female breast: Secondary | ICD-10-CM

## 2014-02-05 DIAGNOSIS — Z5189 Encounter for other specified aftercare: Secondary | ICD-10-CM

## 2014-02-05 MED ORDER — PEGFILGRASTIM INJECTION 6 MG/0.6ML
6.0000 mg | Freq: Once | SUBCUTANEOUS | Status: AC
  Administered 2014-02-05: 6 mg via SUBCUTANEOUS
  Filled 2014-02-05: qty 0.6

## 2014-02-05 NOTE — Patient Instructions (Signed)
Pegfilgrastim injection What is this medicine? PEGFILGRASTIM (peg fil GRA stim) is a long-acting granulocyte colony-stimulating factor that stimulates the growth of neutrophils, a type of white blood cell important in the body's fight against infection. It is used to reduce the incidence of fever and infection in patients with certain types of cancer who are receiving chemotherapy that affects the bone marrow. This medicine may be used for other purposes; ask your health care provider or pharmacist if you have questions. COMMON BRAND NAME(S): Neulasta What should I tell my health care provider before I take this medicine? They need to know if you have any of these conditions: -latex allergy -ongoing radiation therapy -sickle cell disease -skin reactions to acrylic adhesives (On-Body Injector only) -an unusual or allergic reaction to pegfilgrastim, filgrastim, other medicines, foods, dyes, or preservatives -pregnant or trying to get pregnant -breast-feeding How should I use this medicine? This medicine is for injection under the skin. If you get this medicine at home, you will be taught how to prepare and give the pre-filled syringe or how to use the On-body Injector. Refer to the patient Instructions for Use for detailed instructions. Use exactly as directed. Take your medicine at regular intervals. Do not take your medicine more often than directed. It is important that you put your used needles and syringes in a special sharps container. Do not put them in a trash can. If you do not have a sharps container, call your pharmacist or healthcare provider to get one. Talk to your pediatrician regarding the use of this medicine in children. Special care may be needed. Overdosage: If you think you have taken too much of this medicine contact a poison control center or emergency room at once. NOTE: This medicine is only for you. Do not share this medicine with others. What if I miss a dose? It is  important not to miss your dose. Call your doctor or health care professional if you miss your dose. If you miss a dose due to an On-body Injector failure or leakage, a new dose should be administered as soon as possible using a single prefilled syringe for manual use. What may interact with this medicine? Interactions have not been studied. Give your health care provider a list of all the medicines, herbs, non-prescription drugs, or dietary supplements you use. Also tell them if you smoke, drink alcohol, or use illegal drugs. Some items may interact with your medicine. This list may not describe all possible interactions. Give your health care provider a list of all the medicines, herbs, non-prescription drugs, or dietary supplements you use. Also tell them if you smoke, drink alcohol, or use illegal drugs. Some items may interact with your medicine. What should I watch for while using this medicine? You may need blood work done while you are taking this medicine. If you are going to need a MRI, CT scan, or other procedure, tell your doctor that you are using this medicine (On-Body Injector only). What side effects may I notice from receiving this medicine? Side effects that you should report to your doctor or health care professional as soon as possible: -allergic reactions like skin rash, itching or hives, swelling of the face, lips, or tongue -dizziness -fever -pain, redness, or irritation at site where injected -pinpoint red spots on the skin -shortness of breath or breathing problems -stomach or side pain, or pain at the shoulder -swelling -tiredness -trouble passing urine Side effects that usually do not require medical attention (report to your doctor   or health care professional if they continue or are bothersome): -bone pain -muscle pain This list may not describe all possible side effects. Call your doctor for medical advice about side effects. You may report side effects to FDA at  1-800-FDA-1088. Where should I keep my medicine? Keep out of the reach of children. Store pre-filled syringes in a refrigerator between 2 and 8 degrees C (36 and 46 degrees F). Do not freeze. Keep in carton to protect from light. Throw away this medicine if it is left out of the refrigerator for more than 48 hours. Throw away any unused medicine after the expiration date. NOTE: This sheet is a summary. It may not cover all possible information. If you have questions about this medicine, talk to your doctor, pharmacist, or health care provider.  2015, Elsevier/Gold Standard. (2013-07-04 16:14:05)  

## 2014-02-12 ENCOUNTER — Other Ambulatory Visit: Payer: Self-pay | Admitting: *Deleted

## 2014-02-12 DIAGNOSIS — C50412 Malignant neoplasm of upper-outer quadrant of left female breast: Secondary | ICD-10-CM

## 2014-02-13 ENCOUNTER — Encounter: Payer: Self-pay | Admitting: Nurse Practitioner

## 2014-02-13 ENCOUNTER — Other Ambulatory Visit (HOSPITAL_BASED_OUTPATIENT_CLINIC_OR_DEPARTMENT_OTHER): Payer: Medicaid Other

## 2014-02-13 ENCOUNTER — Other Ambulatory Visit (HOSPITAL_BASED_OUTPATIENT_CLINIC_OR_DEPARTMENT_OTHER): Payer: Medicaid Other | Admitting: Nurse Practitioner

## 2014-02-13 ENCOUNTER — Ambulatory Visit (HOSPITAL_BASED_OUTPATIENT_CLINIC_OR_DEPARTMENT_OTHER): Payer: Medicaid Other | Admitting: Nurse Practitioner

## 2014-02-13 VITALS — BP 146/78 | HR 79 | Temp 98.4°F | Resp 18 | Ht 65.0 in | Wt 198.2 lb

## 2014-02-13 DIAGNOSIS — C50412 Malignant neoplasm of upper-outer quadrant of left female breast: Secondary | ICD-10-CM

## 2014-02-13 DIAGNOSIS — Z170 Estrogen receptor positive status [ER+]: Secondary | ICD-10-CM

## 2014-02-13 DIAGNOSIS — J019 Acute sinusitis, unspecified: Secondary | ICD-10-CM

## 2014-02-13 DIAGNOSIS — G4700 Insomnia, unspecified: Secondary | ICD-10-CM

## 2014-02-13 DIAGNOSIS — Z720 Tobacco use: Secondary | ICD-10-CM

## 2014-02-13 DIAGNOSIS — R197 Diarrhea, unspecified: Secondary | ICD-10-CM

## 2014-02-13 LAB — CBC WITH DIFFERENTIAL/PLATELET
BASO%: 0.3 % (ref 0.0–2.0)
Basophils Absolute: 0 10*3/uL (ref 0.0–0.1)
EOS%: 0.1 % (ref 0.0–7.0)
Eosinophils Absolute: 0 10*3/uL (ref 0.0–0.5)
HCT: 35.8 % (ref 34.8–46.6)
HGB: 12.1 g/dL (ref 11.6–15.9)
LYMPH#: 0.8 10*3/uL — AB (ref 0.9–3.3)
LYMPH%: 7.4 % — ABNORMAL LOW (ref 14.0–49.7)
MCH: 32.7 pg (ref 25.1–34.0)
MCHC: 33.7 g/dL (ref 31.5–36.0)
MCV: 96.9 fL (ref 79.5–101.0)
MONO#: 0.8 10*3/uL (ref 0.1–0.9)
MONO%: 7.6 % (ref 0.0–14.0)
NEUT%: 84.6 % — ABNORMAL HIGH (ref 38.4–76.8)
NEUTROS ABS: 9.3 10*3/uL — AB (ref 1.5–6.5)
Platelets: 142 10*3/uL — ABNORMAL LOW (ref 145–400)
RBC: 3.7 10*6/uL (ref 3.70–5.45)
RDW: 13.8 % (ref 11.2–14.5)
WBC: 11.1 10*3/uL — ABNORMAL HIGH (ref 3.9–10.3)

## 2014-02-13 LAB — COMPREHENSIVE METABOLIC PANEL (CC13)
ALT: 36 U/L (ref 0–55)
AST: 15 U/L (ref 5–34)
Albumin: 3.8 g/dL (ref 3.5–5.0)
Alkaline Phosphatase: 98 U/L (ref 40–150)
Anion Gap: 6 mEq/L (ref 3–11)
BUN: 6.9 mg/dL — AB (ref 7.0–26.0)
CALCIUM: 9.1 mg/dL (ref 8.4–10.4)
CO2: 27 mEq/L (ref 22–29)
Chloride: 106 mEq/L (ref 98–109)
Creatinine: 0.8 mg/dL (ref 0.6–1.1)
GLUCOSE: 113 mg/dL (ref 70–140)
POTASSIUM: 4.4 meq/L (ref 3.5–5.1)
Sodium: 139 mEq/L (ref 136–145)
Total Bilirubin: 0.36 mg/dL (ref 0.20–1.20)
Total Protein: 6 g/dL — ABNORMAL LOW (ref 6.4–8.3)

## 2014-02-13 MED ORDER — CHOLESTYRAMINE 4 G PO PACK: 4.0000 g | PACK | Freq: Three times a day (TID) | ORAL | Status: DC

## 2014-02-13 MED ORDER — TRAZODONE HCL 50 MG PO TABS: 50.0000 mg | ORAL_TABLET | Freq: Every evening | ORAL | Status: DC | PRN

## 2014-02-13 MED ORDER — LOPERAMIDE HCL 2 MG PO CAPS: 2.0000 mg | ORAL_CAPSULE | ORAL | Status: DC | PRN

## 2014-02-13 MED ORDER — HYDROCODONE-ACETAMINOPHEN 5-325 MG PO TABS: 1.0000 | ORAL_TABLET | Freq: Four times a day (QID) | ORAL | Status: DC | PRN

## 2014-02-13 NOTE — Progress Notes (Signed)
Jessica [name]  [phone] [phone]     ID: Jessica [name] DOB: [birth year]  MR#: [id number]  HTX#:[id number]  Patient Care Team: [name], MD as PCP - General (Nurse Practitioner) [name], MD as Consulting Physician (General Surgery) [name], MD as Consulting Physician (Oncology) [name], MD as Consulting Physician (Radiation Oncology)  CHIEF COMPLAINT: Newly diagnosed triple positive breast cancer CURRENT TREATMENT: neoadjuvant chemo-immunotherapy  BREAST CANCER HISTORY: From the original intake note:  Jessica [name] herself palpated a mass in her left breast. She brought it to the attention of [name] at the health department and she was set up for unilateral left mammography and ultrasound 11/27/2013 at the [facility]. This showed a possible distortion in the left breast upper outer quadrant. However the patient's breasts are density category D. On exam there was a firm nodule or mass in the left breast 2:00 location which by ultrasound was irregular and hypoechoic and measured 1.5 cm. There was no left axillary lymphadenopathy noted.  Biopsy of the mass in question 11/29/2013 showed ([id number]) an invasive ductal carcinoma, grade 1, estrogen receptor 94% positive, progesterone receptor 94% positive, both with strong staining intensity, with an MIB-1 of 46%, and HER-2 amplified by CISH, the signals ratio of 4.85 and the number per cell 6.55.  : 12/08/2013 the patient underwent bilateral breast MRI. This showed in the left breast upper outer quadrant a 2.9 cm irregular enhancing mass and extending posteriorly from this an area of clumped non-masslike enhancement extending approximately 6 cm and leading to a posterior group of nodules which were small but measured in aggregate 2.2 cm. In addition, there was a separate 0.8 cm irregular spiculated enhancing mass in the upper inner aspect of the left breast. In the  lower outer left breast there was a 0.8 cm oval enhancing mass. There were no abnormal appearing lymph nodes.  [name] subsequent history is as detailed below  INTERVAL HISTORY: Jessica [name] returns today for follow up of her breast cancer, accompanied by her husband, Jessica [name]. Today is [name] 8, cycle 1 of 6 planned cycles of carboplatin, docetaxel, pertuzumab, and trastuzumab to be given every 3 weeks.   Jessica [name] denies fevers, chills, nausea, or vomiting. However, she has experienced 4-5 episodes of diarrhea for the past 3 days. She is not taking anything to alleviate this, but has been staying hydrated with Pedialyte the best that she can. Her rectum is irritated and occasionally when she wipes, there is a spotting of bright red blood on the toilet tissue. In addition her vision has been blurry since the [name] after treatment. Her energy level is decreased, and she is having difficulty sleeping at night.   REVIEW OF SYSTEMS: A detailed review of systems is otherwise noncontributory.    PAST MEDICAL HISTORY: Past Medical History  Diagnosis Date  . Anxiety   . COPD (chronic obstructive pulmonary disease) 2011    does not use inhaler ofter  . Depression   . Hot flashes   . Family history of anesthesia complication     Had a hard time waking father up only once after several procedures  . Shortness of breath     with exertion  . Heart murmur     PMH: at age 34; no longer have murmur  . Chronic heartburn   . PVC (premature ventricular contraction)     PMH;at 48 y.o.  . Cancer     DCIS  . Asthma  denies history of asthma    PAST SURGICAL HISTORY: Past Surgical History  Procedure Laterality Date  . Polyp removal     . Tonsillectomy    . Wisdom tooth extraction    . Robotic assisted total hysterectomy with bilateral salpingo oopherectomy Bilateral 05/24/2013    Procedure: ROBOTIC ASSISTED TOTAL HYSTERECTOMY WITH BILATERAL SALPINGECTOMY;  Surgeon: Lavonia Drafts, MD;  Location: [facility];  Service: Gynecology;  Laterality: Bilateral;  . Cystoscopy N/A 05/24/2013    Procedure: CYSTOSCOPY;  Surgeon: Lavonia Drafts, MD;  Location: [facility];  Service: Gynecology;  Laterality: N/A;  . Abdominal hysterectomy    . Breast surgery      left breast biopsy x 2  . Portacath placement Right 01/02/2014    Procedure: INSERTION PORT-A-CATH;  Surgeon: [name], MD;  Location: [facility];  Service: General;  Laterality: Right;    FAMILY HISTORY Family History  Problem Relation Age of Onset  . Heart disease Mother   . Hypercholesterolemia Mother   . Heart disease Father   . COPD Father   . Hypercholesterolemia Father   . Cancer Neg Hx   [name] parents are still living, her father is 1 years old and her mother 21 years old as of August 2015. The patient had no brothers, one sister. There is no history of breast or ovarian cancer in the family.  GYNECOLOGIC HISTORY:  Patient's last menstrual period was 03/18/2013. Menarche age 3. She is GX P0. She took oral contraceptives for many years as well as Depo Provera shots. She is status post hysterectomy with bilateral salpingectomy. 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 She scans at work, and she also does all this she'll vein and stocking. She is single but for the last 11 years as lives with her significant other Jessica [name]. He is disabled secondary to multiple orthopedic problems including bilateral avascular necrosis of the hips. Both of them do smoke. The patient is here for drinks on weekends    ADVANCED DIRECTIVES: Not in place    HEALTH MAINTENANCE: History  Substance Use Topics  . Smoking status: Current Every [name] Smoker -- 1.00 packs/[name] for 16 years    Types: Cigarettes  . Smokeless tobacco: Never Used  . Alcohol Use: Yes     Comment: occasional      Colonoscopy:  PAP: January 2015   Bone density:  Lipid panel:  No Known Allergies  Current  Outpatient Prescriptions  Medication Sig Dispense Refill  . budesonide-formoterol (SYMBICORT) 160-4.5 MCG/ACT inhaler Inhale 1 puff into the lungs 2 (two) times daily.       . calcium carbonate (TUMS - DOSED IN MG ELEMENTAL CALCIUM) 500 MG chewable tablet Chew 1 tablet by mouth daily.      . ciprofloxacin (CIPRO) 500 MG tablet Take 1 tablet (500 mg total) by mouth 2 (two) times daily.  14 tablet  0  . dexamethasone (DECADRON) 4 MG tablet Take 2 tablets (8 mg total) by mouth 2 (two) times daily. Start the [name] before Taxotere. Then again the [name] after chemo for 3 days.  30 tablet  1  . fluconazole (DIFLUCAN) 100 MG tablet Take 1 tablet (100 mg total) by mouth daily.  7 tablet  0  . fluticasone (FLONASE) 50 MCG/ACT nasal spray Place 2 sprays into both nostrils daily.  16 g  2  . gabapentin (NEURONTIN) 600 MG tablet Take 600 mg by mouth at bedtime  as needed (anxiety).       Marland Kitchen HYDROcodone-acetaminophen (NORCO/VICODIN) 5-325 MG per tablet Take 1-2 tablets by mouth every 6 (six) hours as needed for moderate pain.  30 tablet  0  . ibuprofen (ADVIL,MOTRIN) 800 MG tablet Take 800 mg by mouth every 8 (eight) hours as needed (pain).      Marland Kitchen ipratropium (ATROVENT HFA) 17 MCG/ACT inhaler Inhale 1 puff into the lungs every 6 (six) hours as needed for wheezing (SOB).       Marland Kitchen lidocaine-prilocaine (EMLA) cream Apply 1 application topically as needed. Apply over port site 1-2 hours before chemotherapy, cover with plastic wrap  30 g  0  . loratadine (CLARITIN) 10 MG tablet Take 1 tablet (10 mg total) by mouth as needed.  30 tablet  1  . LORazepam (ATIVAN) 0.5 MG tablet Take 1 tablet (0.5 mg total) by mouth at bedtime as needed (Nausea or vomiting).  30 tablet  0  . nystatin (MYCOSTATIN) 100000 UNIT/ML suspension Take 5 mLs (500,000 Units total) by mouth 4 (four) times daily as needed (Swish and spit).  60 mL  0  . omeprazole (PRILOSEC) 40 MG capsule Take 1 capsule (40 mg total) by mouth daily.  30 capsule  2  .  ondansetron (ZOFRAN) 8 MG tablet Take 1 tablet (8 mg total) by mouth 2 (two) times daily. Start the [name] after chemo for 3 days. Then take as needed for nausea or vomiting.  30 tablet  1  . prochlorperazine (COMPAZINE) 10 MG tablet Take 1 tablet (10 mg total) by mouth every 6 (six) hours as needed (Nausea or vomiting).  30 tablet  1  . tobramycin-dexamethasone (TOBRADEX) ophthalmic solution Place 1 drop into both eyes 2 (two) times daily.  5 mL  0  . cholestyramine (QUESTRAN) 4 G packet Take 1 packet (4 g total) by mouth 3 (three) times daily.  60 each  3  . loperamide (IMODIUM) 2 MG capsule Take 1 capsule (2 mg total) by mouth as needed for diarrhea or loose stools.  60 capsule  3  . traZODone (DESYREL) 50 MG tablet Take 1 tablet (50 mg total) by mouth at bedtime as needed for sleep.  30 tablet  3   No current facility-administered medications for this visit.    OBJECTIVE: middle-aged white woman in no acute distress Filed Vitals:   02/13/14 1248  BP: 146/78  Pulse: 79  Temp: 98.4 F (36.9 C)  Resp: 18     Body mass index is 32.98 kg/(m^2).    ECOG FS:1 - Symptomatic but completely ambulatory  Sclerae unicteric, pupils equal and reactive Oropharynx clear and moist-- no thrush No cervical or supraclavicular adenopathy Lungs no rales or rhonchi Heart regular rate and rhythm Abd soft, nontender, positive bowel sounds MSK no focal spinal tenderness, no upper extremity lymphedema Neuro: nonfocal, well oriented, appropriate affect Breasts: deferred   LAB RESULTS:  CMP     Component Value Date/Time   NA 139 02/13/2014 1435   NA 139 12/31/2013 1448   K 4.4 02/13/2014 1435   K 4.6 12/31/2013 1448   CL 103 12/31/2013 1448   CO2 27 02/13/2014 1435   CO2 24 12/31/2013 1448   GLUCOSE 113 02/13/2014 1435   GLUCOSE 93 12/31/2013 1448   BUN 6.9* 02/13/2014 1435   BUN 9 12/31/2013 1448   CREATININE 0.8 02/13/2014 1435   CREATININE 0.83 12/31/2013 1448   CALCIUM 9.1 02/13/2014 1435   CALCIUM  8.8 12/31/2013 1448   PROT  6.0* 02/13/2014 1435   PROT 7.1 06/04/2013 1511   ALBUMIN 3.8 02/13/2014 1435   ALBUMIN 3.4* 06/04/2013 1511   AST 15 02/13/2014 1435   AST 12 06/04/2013 1511   ALT 36 02/13/2014 1435   ALT 14 06/04/2013 1511   ALKPHOS 98 02/13/2014 1435   ALKPHOS 70 06/04/2013 1511   BILITOT 0.36 02/13/2014 1435   BILITOT 0.5 06/04/2013 1511   GFRNONAA 83* 12/31/2013 1448   GFRAA >90 12/31/2013 1448    I No results found for this basename: SPEP,  UPEP,   kappa and lambda light chains    Lab Results  Component Value Date   WBC 11.1* 02/13/2014   NEUTROABS 9.3* 02/13/2014   HGB 12.1 02/13/2014   HCT 35.8 02/13/2014   MCV 96.9 02/13/2014   PLT 142* 02/13/2014      Chemistry      Component Value Date/Time   NA 139 02/13/2014 1435   NA 139 12/31/2013 1448   K 4.4 02/13/2014 1435   K 4.6 12/31/2013 1448   CL 103 12/31/2013 1448   CO2 27 02/13/2014 1435   CO2 24 12/31/2013 1448   BUN 6.9* 02/13/2014 1435   BUN 9 12/31/2013 1448   CREATININE 0.8 02/13/2014 1435   CREATININE 0.83 12/31/2013 1448      Component Value Date/Time   CALCIUM 9.1 02/13/2014 1435   CALCIUM 8.8 12/31/2013 1448   ALKPHOS 98 02/13/2014 1435   ALKPHOS 70 06/04/2013 1511   AST 15 02/13/2014 1435   AST 12 06/04/2013 1511   ALT 36 02/13/2014 1435   ALT 14 06/04/2013 1511   BILITOT 0.36 02/13/2014 1435   BILITOT 0.5 06/04/2013 1511       No results found for this basename: LABCA2    No components found with this basename: LABCA125    No results found for this basename: INR,  in the last 168 hours  Urinalysis    Component Value Date/Time   COLORURINE YELLOW 06/04/2013 1420   APPEARANCEUR HAZY* 06/04/2013 1420   LABSPEC 1.030 01/27/2014 1350   LABSPEC >1.030* 06/04/2013 1420   PHURINE 6.0 06/04/2013 1420   GLUCOSEU Negative 01/27/2014 1350   GLUCOSEU NEGATIVE 06/04/2013 1420   HGBUR MODERATE* 06/04/2013 1420   HGBUR negative 10/07/2009 1044   BILIRUBINUR NEGATIVE 06/04/2013 1420   KETONESUR  NEGATIVE 06/04/2013 1420   PROTEINUR NEGATIVE 06/04/2013 1420   UROBILINOGEN Color Interference 01/27/2014 1350   UROBILINOGEN 0.2 06/04/2013 1420   NITRITE NEGATIVE 06/04/2013 1420   LEUKOCYTESUR NEGATIVE 06/04/2013 1420    STUDIES: No results found.  ASSESSMENT: 48 y.o. BRCA negative [place] woman status post left breast upper outer quadrant biopsy 11/29/2013 for a clinical T2 N0, stage IIA invasive ductal carcinoma, grade 1, triple positive, with an MIB-1 of 46%.  (1) MRI-guided biopsy of two additional areas in the left breast showed only tubular adenomas, no malignancy.   (2) neoadjuvant chemotherapy to start 01/14/2014, consisting of carboplatin, docetaxel, trastuzumab and pertuzumab given every 21 days for 6 cycles, with Neulasta on [name] 2-- echocardiogram 12/19/2013 showed an ejection fraction in the 50-55% range.  (3) trastuzumab to be continued to the total one year, through September of 2016  (4) definitive surgery to follow chemotherapy  (5) radiation to follow surgery  (6) antiestrogen therapy to follow radiation  PLAN: Kosisochukwu feels only "fair" today. The labs were reviewed in detail and were stable. I consulted with Dr. Jana Hakim concerning her diarrhea, and it is likely the pertuzumab causing this side effect.  Norelle stands to benefit greatly from this drug, and because she has tried neither imodium nor [place], I have written prescritptions for both to see if we can manage her diarrhea. If after trying these medications, the diarrhea persists, we will likely remove pertuzumab from her regimen. She will try over the counter Tuck's wipes and cream for her rectal irritation. She will also begin 56m trazodone nightly, understanding that this will take a few weeks to build up in her system.   LPhyliswill return in 2 weeks for the start of cycle 3. She understands and agrees with this plan. She knows the goal of treatment in her case is cure. She has been encouraged to call with any  issues that might arise before her next visit here.    FMarcelino Duster NP   02/13/2014 3:41 PM

## 2014-02-14 ENCOUNTER — Other Ambulatory Visit: Payer: Self-pay | Admitting: *Deleted

## 2014-02-14 DIAGNOSIS — C50412 Malignant neoplasm of upper-outer quadrant of left female breast: Secondary | ICD-10-CM

## 2014-02-14 MED ORDER — LOPERAMIDE HCL 2 MG PO CAPS: ORAL_CAPSULE | ORAL | Status: DC

## 2014-02-24 ENCOUNTER — Other Ambulatory Visit: Payer: Self-pay | Admitting: Nurse Practitioner

## 2014-02-24 DIAGNOSIS — C50412 Malignant neoplasm of upper-outer quadrant of left female breast: Secondary | ICD-10-CM

## 2014-02-25 ENCOUNTER — Telehealth: Payer: Self-pay | Admitting: *Deleted

## 2014-02-25 ENCOUNTER — Other Ambulatory Visit: Payer: Self-pay | Admitting: Oncology

## 2014-02-25 ENCOUNTER — Encounter: Payer: Self-pay | Admitting: Nurse Practitioner

## 2014-02-25 ENCOUNTER — Ambulatory Visit (HOSPITAL_BASED_OUTPATIENT_CLINIC_OR_DEPARTMENT_OTHER): Payer: Medicaid Other

## 2014-02-25 ENCOUNTER — Telehealth: Payer: Self-pay | Admitting: Nurse Practitioner

## 2014-02-25 ENCOUNTER — Ambulatory Visit: Payer: Medicaid Other

## 2014-02-25 ENCOUNTER — Ambulatory Visit (HOSPITAL_BASED_OUTPATIENT_CLINIC_OR_DEPARTMENT_OTHER): Payer: Medicaid Other | Admitting: Nurse Practitioner

## 2014-02-25 ENCOUNTER — Other Ambulatory Visit (HOSPITAL_BASED_OUTPATIENT_CLINIC_OR_DEPARTMENT_OTHER): Payer: Medicaid Other

## 2014-02-25 VITALS — BP 126/67 | HR 75 | Temp 97.7°F | Resp 18 | Ht 65.0 in | Wt 200.8 lb

## 2014-02-25 DIAGNOSIS — Z95828 Presence of other vascular implants and grafts: Secondary | ICD-10-CM

## 2014-02-25 DIAGNOSIS — C50412 Malignant neoplasm of upper-outer quadrant of left female breast: Secondary | ICD-10-CM

## 2014-02-25 DIAGNOSIS — C50919 Malignant neoplasm of unspecified site of unspecified female breast: Secondary | ICD-10-CM

## 2014-02-25 DIAGNOSIS — Z720 Tobacco use: Secondary | ICD-10-CM

## 2014-02-25 DIAGNOSIS — Z5111 Encounter for antineoplastic chemotherapy: Secondary | ICD-10-CM

## 2014-02-25 DIAGNOSIS — Z5112 Encounter for antineoplastic immunotherapy: Secondary | ICD-10-CM

## 2014-02-25 DIAGNOSIS — Z170 Estrogen receptor positive status [ER+]: Secondary | ICD-10-CM

## 2014-02-25 LAB — COMPREHENSIVE METABOLIC PANEL (CC13)
ALT: 29 U/L (ref 0–55)
AST: 14 U/L (ref 5–34)
Albumin: 4.1 g/dL (ref 3.5–5.0)
Alkaline Phosphatase: 74 U/L (ref 40–150)
Anion Gap: 6 mEq/L (ref 3–11)
BILIRUBIN TOTAL: 0.51 mg/dL (ref 0.20–1.20)
BUN: 12.4 mg/dL (ref 7.0–26.0)
CALCIUM: 10.2 mg/dL (ref 8.4–10.4)
CHLORIDE: 106 meq/L (ref 98–109)
CO2: 27 mEq/L (ref 22–29)
Creatinine: 0.8 mg/dL (ref 0.6–1.1)
Glucose: 119 mg/dl (ref 70–140)
Potassium: 4.3 mEq/L (ref 3.5–5.1)
SODIUM: 140 meq/L (ref 136–145)
Total Protein: 7.1 g/dL (ref 6.4–8.3)

## 2014-02-25 LAB — CBC WITH DIFFERENTIAL/PLATELET
BASO%: 0.6 % (ref 0.0–2.0)
Basophils Absolute: 0.1 10*3/uL (ref 0.0–0.1)
EOS%: 0 % (ref 0.0–7.0)
Eosinophils Absolute: 0 10*3/uL (ref 0.0–0.5)
HEMATOCRIT: 36.7 % (ref 34.8–46.6)
HGB: 12.3 g/dL (ref 11.6–15.9)
LYMPH#: 0.5 10*3/uL — AB (ref 0.9–3.3)
LYMPH%: 4.4 % — ABNORMAL LOW (ref 14.0–49.7)
MCH: 32.6 pg (ref 25.1–34.0)
MCHC: 33.6 g/dL (ref 31.5–36.0)
MCV: 97 fL (ref 79.5–101.0)
MONO#: 0.1 10*3/uL (ref 0.1–0.9)
MONO%: 0.7 % (ref 0.0–14.0)
NEUT#: 10.7 10*3/uL — ABNORMAL HIGH (ref 1.5–6.5)
NEUT%: 94.3 % — AB (ref 38.4–76.8)
Platelets: 167 10*3/uL (ref 145–400)
RBC: 3.78 10*6/uL (ref 3.70–5.45)
RDW: 15.8 % — ABNORMAL HIGH (ref 11.2–14.5)
WBC: 11.4 10*3/uL — ABNORMAL HIGH (ref 3.9–10.3)

## 2014-02-25 MED ORDER — DIPHENHYDRAMINE HCL 25 MG PO CAPS
ORAL_CAPSULE | ORAL | Status: AC
  Filled 2014-02-25: qty 1

## 2014-02-25 MED ORDER — ONDANSETRON 16 MG/50ML IVPB (CHCC)
16.0000 mg | Freq: Once | INTRAVENOUS | Status: AC
  Administered 2014-02-25: 16 mg via INTRAVENOUS

## 2014-02-25 MED ORDER — ONDANSETRON 16 MG/50ML IVPB (CHCC)
INTRAVENOUS | Status: AC
  Filled 2014-02-25: qty 16

## 2014-02-25 MED ORDER — TRASTUZUMAB CHEMO INJECTION 440 MG
6.0000 mg/kg | Freq: Once | INTRAVENOUS | Status: AC
  Administered 2014-02-25: 525 mg via INTRAVENOUS
  Filled 2014-02-25: qty 25

## 2014-02-25 MED ORDER — HEPARIN SOD (PORK) LOCK FLUSH 100 UNIT/ML IV SOLN
500.0000 [IU] | Freq: Once | INTRAVENOUS | Status: AC | PRN
  Administered 2014-02-25: 500 [IU]
  Filled 2014-02-25: qty 5

## 2014-02-25 MED ORDER — ACETAMINOPHEN 325 MG PO TABS
ORAL_TABLET | ORAL | Status: AC
  Filled 2014-02-25: qty 1

## 2014-02-25 MED ORDER — SODIUM CHLORIDE 0.9 % IJ SOLN
10.0000 mL | INTRAMUSCULAR | Status: DC | PRN
  Administered 2014-02-25: 10 mL
  Filled 2014-02-25: qty 10

## 2014-02-25 MED ORDER — CARBOPLATIN CHEMO INJECTION 600 MG/60ML
720.0000 mg | Freq: Once | INTRAVENOUS | Status: AC
  Administered 2014-02-25: 720 mg via INTRAVENOUS
  Filled 2014-02-25: qty 72

## 2014-02-25 MED ORDER — SODIUM CHLORIDE 0.9 % IV SOLN
420.0000 mg | Freq: Once | INTRAVENOUS | Status: AC
  Administered 2014-02-25: 420 mg via INTRAVENOUS
  Filled 2014-02-25: qty 14

## 2014-02-25 MED ORDER — DEXAMETHASONE SODIUM PHOSPHATE 20 MG/5ML IJ SOLN
INTRAMUSCULAR | Status: AC
  Filled 2014-02-25: qty 5

## 2014-02-25 MED ORDER — DEXAMETHASONE SODIUM PHOSPHATE 20 MG/5ML IJ SOLN
20.0000 mg | Freq: Once | INTRAMUSCULAR | Status: AC
Start: 2014-02-25 — End: 2014-02-25
  Administered 2014-02-25: 20 mg via INTRAVENOUS

## 2014-02-25 MED ORDER — DOCETAXEL CHEMO INJECTION 160 MG/16ML
75.0000 mg/m2 | Freq: Once | INTRAVENOUS | Status: AC
  Administered 2014-02-25: 150 mg via INTRAVENOUS
  Filled 2014-02-25: qty 15

## 2014-02-25 MED ORDER — SODIUM CHLORIDE 0.9 % IJ SOLN
10.0000 mL | INTRAMUSCULAR | Status: DC | PRN
  Administered 2014-02-25: 10 mL via INTRAVENOUS
  Filled 2014-02-25: qty 10

## 2014-02-25 MED ORDER — ACETAMINOPHEN 325 MG PO TABS
650.0000 mg | ORAL_TABLET | Freq: Once | ORAL | Status: AC
  Administered 2014-02-25: 650 mg via ORAL

## 2014-02-25 MED ORDER — SODIUM CHLORIDE 0.9 % IV SOLN
Freq: Once | INTRAVENOUS | Status: AC
  Administered 2014-02-25: 10:00:00 via INTRAVENOUS

## 2014-02-25 MED ORDER — DIPHENHYDRAMINE HCL 25 MG PO CAPS
25.0000 mg | ORAL_CAPSULE | Freq: Once | ORAL | Status: AC
  Administered 2014-02-25: 25 mg via ORAL

## 2014-02-25 MED ORDER — SODIUM CHLORIDE 0.9 % IV SOLN
Freq: Once | INTRAVENOUS | Status: AC
  Administered 2014-02-25: 09:00:00 via INTRAVENOUS

## 2014-02-25 NOTE — Telephone Encounter (Signed)
Per staff message and POF I have scheduled appts. Advised scheduler of appts. JMW  

## 2014-02-25 NOTE — Patient Instructions (Signed)
Carboplatin injection What is this medicine? CARBOPLATIN (KAR boe pla tin) is a chemotherapy drug. It targets fast dividing cells, like cancer cells, and causes these cells to die. This medicine is used to treat ovarian cancer and many other cancers. This medicine may be used for other purposes; ask your health care provider or pharmacist if you have questions. COMMON BRAND NAME(S): Paraplatin What should I tell my health care provider before I take this medicine? They need to know if you have any of these conditions: -blood disorders -hearing problems -kidney disease -recent or ongoing radiation therapy -an unusual or allergic reaction to carboplatin, cisplatin, other chemotherapy, other medicines, foods, dyes, or preservatives -pregnant or trying to get pregnant -breast-feeding How should I use this medicine? This drug is usually given as an infusion into a vein. It is administered in a hospital or clinic by a specially trained health care professional. Talk to your pediatrician regarding the use of this medicine in children. Special care may be needed. Overdosage: If you think you have taken too much of this medicine contact a poison control center or emergency room at once. NOTE: This medicine is only for you. Do not share this medicine with others. What if I miss a dose? It is important not to miss a dose. Call your doctor or health care professional if you are unable to keep an appointment. What may interact with this medicine? -medicines for seizures -medicines to increase blood counts like filgrastim, pegfilgrastim, sargramostim -some antibiotics like amikacin, gentamicin, neomycin, streptomycin, tobramycin -vaccines Talk to your doctor or health care professional before taking any of these medicines: -acetaminophen -aspirin -ibuprofen -ketoprofen -naproxen This list may not describe all possible interactions. Give your health care provider a list of all the medicines, herbs,  non-prescription drugs, or dietary supplements you use. Also tell them if you smoke, drink alcohol, or use illegal drugs. Some items may interact with your medicine. What should I watch for while using this medicine? Your condition will be monitored carefully while you are receiving this medicine. You will need important blood work done while you are taking this medicine. This drug may make you feel generally unwell. This is not uncommon, as chemotherapy can affect healthy cells as well as cancer cells. Report any side effects. Continue your course of treatment even though you feel ill unless your doctor tells you to stop. In some cases, you may be given additional medicines to help with side effects. Follow all directions for their use. Call your doctor or health care professional for advice if you get a fever, chills or sore throat, or other symptoms of a cold or flu. Do not treat yourself. This drug decreases your body's ability to fight infections. Try to avoid being around people who are sick. This medicine may increase your risk to bruise or bleed. Call your doctor or health care professional if you notice any unusual bleeding. Be careful brushing and flossing your teeth or using a toothpick because you may get an infection or bleed more easily. If you have any dental work done, tell your dentist you are receiving this medicine. Avoid taking products that contain aspirin, acetaminophen, ibuprofen, naproxen, or ketoprofen unless instructed by your doctor. These medicines may hide a fever. Do not become pregnant while taking this medicine. Women should inform their doctor if they wish to become pregnant or think they might be pregnant. There is a potential for serious side effects to an unborn child. Talk to your health care professional or  pharmacist for more information. Do not breast-feed an infant while taking this medicine. What side effects may I notice from receiving this medicine? Side effects  that you should report to your doctor or health care professional as soon as possible: -allergic reactions like skin rash, itching or hives, swelling of the face, lips, or tongue -signs of infection - fever or chills, cough, sore throat, pain or difficulty passing urine -signs of decreased platelets or bleeding - bruising, pinpoint red spots on the skin, black, tarry stools, nosebleeds -signs of decreased red blood cells - unusually weak or tired, fainting spells, lightheadedness -breathing problems -changes in hearing -changes in vision -chest pain -high blood pressure -low blood counts - This drug may decrease the number of white blood cells, red blood cells and platelets. You may be at increased risk for infections and bleeding. -nausea and vomiting -pain, swelling, redness or irritation at the injection site -pain, tingling, numbness in the hands or feet -problems with balance, talking, walking -trouble passing urine or change in the amount of urine Side effects that usually do not require medical attention (report to your doctor or health care professional if they continue or are bothersome): -hair loss -loss of appetite -metallic taste in the mouth or changes in taste This list may not describe all possible side effects. Call your doctor for medical advice about side effects. You may report side effects to FDA at 1-800-FDA-1088. Where should I keep my medicine? This drug is given in a hospital or clinic and will not be stored at home. NOTE: This sheet is a summary. It may not cover all possible information. If you have questions about this medicine, talk to your doctor, pharmacist, or health care provider.  2015, [employer]. (2007-07-10 14:38:05) Docetaxel injection What is this medicine? DOCETAXEL (doe se TAX el) is a chemotherapy drug. It targets fast dividing cells, like cancer cells, and causes these cells to die. This medicine is used to treat many types of cancers  like breast cancer, certain stomach cancers, head and neck cancer, lung cancer, and prostate cancer. This medicine may be used for other purposes; ask your health care provider or pharmacist if you have questions. COMMON BRAND NAME(S): Docefrez, Taxotere What should I tell my health care provider before I take this medicine? They need to know if you have any of these conditions: -infection (especially a virus infection such as chickenpox, cold sores, or herpes) -liver disease -low blood counts, like low white cell, platelet, or red cell counts -an unusual or allergic reaction to docetaxel, polysorbate 80, other chemotherapy agents, other medicines, foods, dyes, or preservatives -pregnant or trying to get pregnant -breast-feeding How should I use this medicine? This drug is given as an infusion into a vein. It is administered in a hospital or clinic by a specially trained health care professional. Talk to your pediatrician regarding the use of this medicine in children. Special care may be needed. Overdosage: If you think you have taken too much of this medicine contact a poison control center or emergency room at once. NOTE: This medicine is only for you. Do not share this medicine with others. What if I miss a dose? It is important not to miss your dose. Call your doctor or health care professional if you are unable to keep an appointment. What may interact with this medicine? -cyclosporine -erythromycin -ketoconazole -medicines to increase blood counts like filgrastim, pegfilgrastim, sargramostim -vaccines Talk to your doctor or health care professional before taking any of these  medicines: -acetaminophen -aspirin -ibuprofen -ketoprofen -naproxen This list may not describe all possible interactions. Give your health care provider a list of all the medicines, herbs, non-prescription drugs, or dietary supplements you use. Also tell them if you smoke, drink alcohol, or use illegal drugs.  Some items may interact with your medicine. What should I watch for while using this medicine? Your condition will be monitored carefully while you are receiving this medicine. You will need important blood work done while you are taking this medicine. This drug may make you feel generally unwell. This is not uncommon, as chemotherapy can affect healthy cells as well as cancer cells. Report any side effects. Continue your course of treatment even though you feel ill unless your doctor tells you to stop. In some cases, you may be given additional medicines to help with side effects. Follow all directions for their use. Call your doctor or health care professional for advice if you get a fever, chills or sore throat, or other symptoms of a cold or flu. Do not treat yourself. This drug decreases your body's ability to fight infections. Try to avoid being around people who are sick. This medicine may increase your risk to bruise or bleed. Call your doctor or health care professional if you notice any unusual bleeding. Be careful brushing and flossing your teeth or using a toothpick because you may get an infection or bleed more easily. If you have any dental work done, tell your dentist you are receiving this medicine. Avoid taking products that contain aspirin, acetaminophen, ibuprofen, naproxen, or ketoprofen unless instructed by your doctor. These medicines may hide a fever. This medicine contains an alcohol in the product. You may get drowsy or dizzy. Do not drive, use machinery, or do anything that needs mental alertness until you know how this medicine affects you. Do not stand or sit up quickly, especially if you are an older patient. This reduces the risk of dizzy or fainting spells. Avoid alcoholic drinks Do not become pregnant while taking this medicine. Women should inform their doctor if they wish to become pregnant or think they might be pregnant. There is a potential for serious side effects to  an unborn child. Talk to your health care professional or pharmacist for more information. Do not breast-feed an infant while taking this medicine. What side effects may I notice from receiving this medicine? Side effects that you should report to your doctor or health care professional as soon as possible: -allergic reactions like skin rash, itching or hives, swelling of the face, lips, or tongue -low blood counts - This drug may decrease the number of white blood cells, red blood cells and platelets. You may be at increased risk for infections and bleeding. -signs of infection - fever or chills, cough, sore throat, pain or difficulty passing urine -signs of decreased platelets or bleeding - bruising, pinpoint red spots on the skin, black, tarry stools, nosebleeds -signs of decreased red blood cells - unusually weak or tired, fainting spells, lightheadedness -breathing problems -fast or irregular heartbeat -low blood pressure -mouth sores -nausea and vomiting -pain, swelling, redness or irritation at the injection site -pain, tingling, numbness in the hands or feet -swelling of the ankle, feet, hands -weight gain Side effects that usually do not require medical attention (report to your prescriber or health care professional if they continue or are bothersome): -bone pain -complete hair loss including hair on your head, underarms, pubic hair, eyebrows, and eyelashes -diarrhea -excessive tearing -changes in the  color of fingernails -loosening of the fingernails -nausea -muscle pain -red flush to skin -sweating -weak or tired This list may not describe all possible side effects. Call your doctor for medical advice about side effects. You may report side effects to FDA at 1-800-FDA-1088. Where should I keep my medicine? This drug is given in a hospital or clinic and will not be stored at home. NOTE: This sheet is a summary. It may not cover all possible information. If you have  questions about this medicine, talk to your doctor, pharmacist, or health care provider.  2015, [employer] Standard. (2013-02-28 22:21:02) Pertuzumab injection What is this medicine? PERTUZUMAB (per TOOZ ue mab) is a monoclonal antibody that targets a protein called HER2. HER2 is found in some breast cancers. This medicine can stop cancer cell growth. This medicine is used with other cancer treatments. This medicine may be used for other purposes; ask your health care provider or pharmacist if you have questions. COMMON BRAND NAME(S): PERJETA What should I tell my health care provider before I take this medicine? They need to know if you have any of these conditions: -heart disease -heart failure -high blood pressure -history of irregular heart beat -recent or ongoing radiation therapy -an unusual or allergic reaction to pertuzumab, other medicines, foods, dyes, or preservatives -pregnant or trying to get pregnant -breast-feeding How should I use this medicine? This medicine is for infusion into a vein. It is given by a health care professional in a hospital or clinic setting. Talk to your pediatrician regarding the use of this medicine in children. Special care may be needed. Overdosage: If you think you've taken too much of this medicine contact a poison control center or emergency room at once. Overdosage: If you think you have taken too much of this medicine contact a poison control center or emergency room at once. NOTE: This medicine is only for you. Do not share this medicine with others. What if I miss a dose? It is important not to miss your dose. Call your doctor or health care professional if you are unable to keep an appointment. What may interact with this medicine? Interactions are not expected. Give your health care provider a list of all the medicines, herbs, non-prescription drugs, or dietary supplements you use. Also tell them if you smoke, drink alcohol, or use  illegal drugs. Some items may interact with your medicine. This list may not describe all possible interactions. Give your health care provider a list of all the medicines, herbs, non-prescription drugs, or dietary supplements you use. Also tell them if you smoke, drink alcohol, or use illegal drugs. Some items may interact with your medicine. What should I watch for while using this medicine? Your condition will be monitored carefully while you are receiving this medicine. Report any side effects. Continue your course of treatment even though you feel ill unless your doctor tells you to stop. Do not become pregnant while taking this medicine. Women should inform their doctor if they wish to become pregnant or think they might be pregnant. There is a potential for serious side effects to an unborn child. Talk to your health care professional or pharmacist for more information. Do not breast-feed an infant while taking this medicine. Call your doctor or health care professional for advice if you get a fever, chills or sore throat, or other symptoms of a cold or flu. Do not treat yourself. Try to avoid being around people who are sick. You may experience fever, chills, and  headache during the infusion. Report any side effects during the infusion to your health care professional. What side effects may I notice from receiving this medicine? Side effects that you should report to your doctor or health care professional as soon as possible: -breathing problems -chest pain or palpitations -dizziness -feeling faint or lightheaded -fever or chills -skin rash, itching or hives -sore throat -swelling of the face, lips, or tongue -swelling of the legs or ankles -unusually weak or tired Side effects that usually do not require medical attention (Report these to your doctor or health care professional if they continue or are bothersome.): -diarrhea -hair loss -nausea, vomiting -tiredness This list may not  describe all possible side effects. Call your doctor for medical advice about side effects. You may report side effects to FDA at 1-800-FDA-1088. Where should I keep my medicine? This drug is given in a hospital or clinic and will not be stored at home. NOTE: This sheet is a summary. It may not cover all possible information. If you have questions about this medicine, talk to your doctor, pharmacist, or health care provider.  2015, [employer]. (2012-02-01 16:54:15) Trastuzumab injection for infusion What is this medicine? TRASTUZUMAB (tras TOO zoo mab) is a monoclonal antibody. It targets a protein called HER2. This protein is found in some stomach and breast cancers. This medicine can stop cancer cell growth. This medicine may be used with other cancer treatments. This medicine may be used for other purposes; ask your health care provider or pharmacist if you have questions. COMMON BRAND NAME(S): Herceptin What should I tell my health care provider before I take this medicine? They need to know if you have any of these conditions: -heart disease -heart failure -infection (especially a virus infection such as chickenpox, cold sores, or herpes) -lung or breathing disease, like asthma -recent or ongoing radiation therapy -an unusual or allergic reaction to trastuzumab, benzyl alcohol, or other medications, foods, dyes, or preservatives -pregnant or trying to get pregnant -breast-feeding How should I use this medicine? This drug is given as an infusion into a vein. It is administered in a hospital or clinic by a specially trained health care professional. Talk to your pediatrician regarding the use of this medicine in children. This medicine is not approved for use in children. Overdosage: If you think you have taken too much of this medicine contact a poison control center or emergency room at once. NOTE: This medicine is only for you. Do not share this medicine with others. What  if I miss a dose? It is important not to miss a dose. Call your doctor or health care professional if you are unable to keep an appointment. What may interact with this medicine? -cyclophosphamide -doxorubicin -warfarin This list may not describe all possible interactions. Give your health care provider a list of all the medicines, herbs, non-prescription drugs, or dietary supplements you use. Also tell them if you smoke, drink alcohol, or use illegal drugs. Some items may interact with your medicine. What should I watch for while using this medicine? Visit your doctor for checks on your progress. Report any side effects. Continue your course of treatment even though you feel ill unless your doctor tells you to stop. Call your doctor or health care professional for advice if you get a fever, chills or sore throat, or other symptoms of a cold or flu. Do not treat yourself. Try to avoid being around people who are sick. You may experience fever, chills and shaking during  your first infusion. These effects are usually mild and can be treated with other medicines. Report any side effects during the infusion to your health care professional. Fever and chills usually do not happen with later infusions. What side effects may I notice from receiving this medicine? Side effects that you should report to your doctor or other health care professional as soon as possible: -breathing difficulties -chest pain or palpitations -cough -dizziness or fainting -fever or chills, sore throat -skin rash, itching or hives -swelling of the legs or ankles -unusually weak or tired Side effects that usually do not require medical attention (report to your doctor or other health care professional if they continue or are bothersome): -loss of appetite -headache -muscle aches -nausea This list may not describe all possible side effects. Call your doctor for medical advice about side effects. You may report side effects  to FDA at 1-800-FDA-1088. Where should I keep my medicine? This drug is given in a hospital or clinic and will not be stored at home. NOTE: This sheet is a summary. It may not cover all possible information. If you have questions about this medicine, talk to your doctor, pharmacist, or health care provider.  2015, [employer] Standard. (2009-02-06 13:43:15)

## 2014-02-25 NOTE — Progress Notes (Signed)
[facility]  [phone] [phone]) [phone]     ID: Jessica [name] DOB: Sep 01, 1965  MR#: [id number]  LPF#:[id number]  Patient Care Team: [name], MD as PCP - General (Nurse Practitioner) [name], MD as Consulting Physician (General Surgery) [name], MD as Consulting Physician (Oncology) [name], MD as Consulting Physician (Radiation Oncology)  CHIEF COMPLAINT: Newly diagnosed triple positive breast cancer CURRENT TREATMENT: neoadjuvant chemo-immunotherapy  BREAST CANCER HISTORY: From the original intake note:  Jessica [name] herself palpated a mass in her left breast. She brought it to the attention of [name] at the health department and she was set up for unilateral left mammography and ultrasound 11/27/2013 at the [facility]. This showed a possible distortion in the left breast upper outer quadrant. However the patient's breasts are density category D. On exam there was a firm nodule or mass in the left breast 2:00 location which by ultrasound was irregular and hypoechoic and measured 1.5 cm. There was no left axillary lymphadenopathy noted.  Biopsy of the mass in question 11/29/2013 showed ([id number]) an invasive ductal carcinoma, grade 1, estrogen receptor 94% positive, progesterone receptor 94% positive, both with strong staining intensity, with an MIB-1 of 46%, and HER-2 amplified by CISH, the signals ratio of 4.85 and the number per cell 6.55.  : 12/08/2013 the patient underwent bilateral breast MRI. This showed in the left breast upper outer quadrant a 2.9 cm irregular enhancing mass and extending posteriorly from this an area of clumped non-masslike enhancement extending approximately 6 cm and leading to a posterior group of nodules which were small but measured in aggregate 2.2 cm. In addition, there was a separate 0.8 cm irregular spiculated enhancing mass in the upper inner aspect of the left breast. In the  lower outer left breast there was a 0.8 cm oval enhancing mass. There were no abnormal appearing lymph nodes.  [name] subsequent history is as detailed below  INTERVAL HISTORY: Jessica [name] returns today for follow up of her breast cancer, accompanied by her husband, [name]. Today is day 1, cycle 2 of 6 planned cycles of carboplatin, docetaxel, pertuzumab, and trastuzumab to be given every 3 weeks.   Jessica [name] is doing better this week. 2 doses of imodium were enough to control her diarrhea. She has not needed to utilize the [place] powder yet. The Tuck's wipes have resolved her rectal irritation. It is too soon to tell if the trazodone is helpful for her sleep, not that she has not been on the steroids, but possibly. Her eyes are blurry, likely from the steroids. She is using the tobradex for her excessive tearing.   REVIEW OF SYSTEMS: Jessica [name] denies fevers, chills, nausea or vomiting. She is eating well and staying well hydrated. She has no shortness of breath, chest pain, cough, or palpitations. She denies mouth sores, rash, or peripheral neuropathy symptoms. A detailed review of systems is otherwise noncontributory.    PAST MEDICAL HISTORY: Past Medical History  Diagnosis Date  . Anxiety   . COPD (chronic obstructive pulmonary disease) 2011    does not use inhaler ofter  . Depression   . Hot flashes   . Family history of anesthesia complication     Had a hard time waking father up only once after several procedures  . Shortness of breath     with exertion  . Heart murmur     PMH: at age 89; no longer have murmur  . Chronic heartburn   . PVC (  premature ventricular contraction)     PMH;at 48 y.o.  . Cancer     DCIS  . Asthma     denies history of asthma    PAST SURGICAL HISTORY: Past Surgical History  Procedure Laterality Date  . Polyp removal     . Tonsillectomy    . Wisdom tooth extraction    . Robotic assisted total hysterectomy with bilateral salpingo oopherectomy Bilateral  05/24/2013    Procedure: ROBOTIC ASSISTED TOTAL HYSTERECTOMY WITH BILATERAL SALPINGECTOMY;  Surgeon: Lavonia Drafts, MD;  Location: [facility];  Service: Gynecology;  Laterality: Bilateral;  . Cystoscopy N/A 05/24/2013    Procedure: CYSTOSCOPY;  Surgeon: Lavonia Drafts, MD;  Location: [facility];  Service: Gynecology;  Laterality: N/A;  . Abdominal hysterectomy    . Breast surgery      left breast biopsy x 2  . Portacath placement Right 01/02/2014    Procedure: INSERTION PORT-A-CATH;  Surgeon: [name], MD;  Location: [facility];  Service: General;  Laterality: Right;    FAMILY HISTORY Family History  Problem Relation Age of Onset  . Heart disease Mother   . Hypercholesterolemia Mother   . Heart disease Father   . COPD Father   . Hypercholesterolemia Father   . Cancer Neg Hx   [name] parents are still living, her father is 69 years old and her mother 92 years old as of August 2015. The patient had no brothers, one sister. There is no history of breast or ovarian cancer in the family.  GYNECOLOGIC HISTORY:  Patient's last menstrual period was 03/18/2013. Menarche age 32. She is GX P0. She took oral contraceptives for many years as well as Depo Provera shots. She is status post hysterectomy with bilateral salpingectomy. 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 She scans at work, and she also does all this she'll vein and stocking. She is single but for the last 11 years as lives with her significant other [name]. He is disabled secondary to multiple orthopedic problems including bilateral avascular necrosis of the hips. Both of them do smoke. The patient is here for drinks on weekends    ADVANCED DIRECTIVES: Not in place    HEALTH MAINTENANCE: History  Substance Use Topics  . Smoking status: Current Every Day Smoker -- 1.00 packs/day for 16 years    Types: Cigarettes  . Smokeless tobacco: Never Used  . Alcohol Use:  Yes     Comment: occasional      Colonoscopy:  PAP: January 2015   Bone density:  Lipid panel:  No Known Allergies  Current Outpatient Prescriptions  Medication Sig Dispense Refill  . budesonide-formoterol (SYMBICORT) 160-4.5 MCG/ACT inhaler Inhale 1 puff into the lungs 2 (two) times daily.     . calcium carbonate (TUMS - DOSED IN MG ELEMENTAL CALCIUM) 500 MG chewable tablet Chew 1 tablet by mouth daily.    . cholestyramine (QUESTRAN) 4 G packet Take 1 packet (4 g total) by mouth 3 (three) times daily. 60 each 3  . ciprofloxacin (CIPRO) 500 MG tablet Take 1 tablet (500 mg total) by mouth 2 (two) times daily. 14 tablet 0  . dexamethasone (DECADRON) 4 MG tablet Take 2 tablets (8 mg total) by mouth 2 (two) times daily. Start the day before Taxotere. Then again the day after chemo for 3 days. 30 tablet 1  . fluconazole (DIFLUCAN) 100 MG tablet Take 1 tablet (100 mg total) by mouth daily.  7 tablet 0  . fluticasone (FLONASE) 50 MCG/ACT nasal spray Place 2 sprays into both nostrils daily. 16 g 2  . gabapentin (NEURONTIN) 600 MG tablet Take 600 mg by mouth at bedtime as needed (anxiety).     Marland Kitchen HYDROcodone-acetaminophen (NORCO/VICODIN) 5-325 MG per tablet Take 1-2 tablets by mouth every 6 (six) hours as needed for moderate pain. 30 tablet 0  . ibuprofen (ADVIL,MOTRIN) 800 MG tablet Take 800 mg by mouth every 8 (eight) hours as needed (pain).    Marland Kitchen ipratropium (ATROVENT HFA) 17 MCG/ACT inhaler Inhale 1 puff into the lungs every 6 (six) hours as needed for wheezing (SOB).     Marland Kitchen lidocaine-prilocaine (EMLA) cream Apply 1 application topically as needed. Apply over port site 1-2 hours before chemotherapy, cover with plastic wrap 30 g 0  . loperamide (IMODIUM) 2 MG capsule TAKE ONE TABLET AFTER EACH LOOSE STOOL. MAXIMUM OF SIX TABLETS A DAY. CHEMOTHERAPY INDUCED DIARRHEA. 60 capsule 3  . loratadine (CLARITIN) 10 MG tablet Take 1 tablet (10 mg total) by mouth as needed. 30 tablet 1  . LORazepam (ATIVAN)  0.5 MG tablet Take 1 tablet (0.5 mg total) by mouth at bedtime as needed (Nausea or vomiting). 30 tablet 0  . nystatin (MYCOSTATIN) 100000 UNIT/ML suspension Take 5 mLs (500,000 Units total) by mouth 4 (four) times daily as needed (Swish and spit). 60 mL 0  . omeprazole (PRILOSEC) 40 MG capsule Take 1 capsule (40 mg total) by mouth daily. 30 capsule 2  . ondansetron (ZOFRAN) 8 MG tablet Take 1 tablet (8 mg total) by mouth 2 (two) times daily. Start the day after chemo for 3 days. Then take as needed for nausea or vomiting. 30 tablet 1  . prochlorperazine (COMPAZINE) 10 MG tablet Take 1 tablet (10 mg total) by mouth every 6 (six) hours as needed (Nausea or vomiting). 30 tablet 1  . tobramycin-dexamethasone (TOBRADEX) ophthalmic solution Place 1 drop into both eyes 2 (two) times daily. 5 mL 0  . traZODone (DESYREL) 50 MG tablet Take 1 tablet (50 mg total) by mouth at bedtime as needed for sleep. 30 tablet 3   No current facility-administered medications for this visit.    OBJECTIVE: middle-aged white [name] in no acute distress Filed Vitals:   02/25/14 0934  BP: 126/67  Pulse: 75  Temp: 97.7 F (36.5 C)  Resp: 18     Body mass index is 33.41 kg/(m^2).    ECOG FS:1 - Symptomatic but completely ambulatory  Skin: warm, dry  HEENT: sclerae anicteric, conjunctivae pink, oropharynx clear. No thrush or mucositis.  Lymph Nodes: No cervical or supraclavicular lymphadenopathy  Lungs: clear to auscultation bilaterally, no rales, wheezes, or rhonci  Heart: regular rate and rhythm  Abdomen: round, soft, non tender, positive bowel sounds  Musculoskeletal: No focal spinal tenderness, no peripheral edema  Neuro: non focal, well oriented, positive affect  Breasts: deferred  LAB RESULTS:  CMP     Component Value Date/Time   NA 140 02/25/2014 0913   NA 139 12/31/2013 1448   K 4.3 02/25/2014 0913   K 4.6 12/31/2013 1448   CL 103 12/31/2013 1448   CO2 27 02/25/2014 0913   CO2 24 12/31/2013 1448    GLUCOSE 119 02/25/2014 0913   GLUCOSE 93 12/31/2013 1448   BUN 12.4 02/25/2014 0913   BUN 9 12/31/2013 1448   CREATININE 0.8 02/25/2014 0913   CREATININE 0.83 12/31/2013 1448   CALCIUM 10.2 02/25/2014 0913   CALCIUM 8.8 12/31/2013 1448  PROT 7.1 02/25/2014 0913   PROT 7.1 06/04/2013 1511   ALBUMIN 4.1 02/25/2014 0913   ALBUMIN 3.4* 06/04/2013 1511   AST 14 02/25/2014 0913   AST 12 06/04/2013 1511   ALT 29 02/25/2014 0913   ALT 14 06/04/2013 1511   ALKPHOS 74 02/25/2014 0913   ALKPHOS 70 06/04/2013 1511   BILITOT 0.51 02/25/2014 0913   BILITOT 0.5 06/04/2013 1511   GFRNONAA 83* 12/31/2013 1448   GFRAA >90 12/31/2013 1448    I No results found for: SPEP  Lab Results  Component Value Date   WBC 11.4* 02/25/2014   NEUTROABS 10.7* 02/25/2014   HGB 12.3 02/25/2014   HCT 36.7 02/25/2014   MCV 97.0 02/25/2014   PLT 167 02/25/2014      Chemistry      Component Value Date/Time   NA 140 02/25/2014 0913   NA 139 12/31/2013 1448   K 4.3 02/25/2014 0913   K 4.6 12/31/2013 1448   CL 103 12/31/2013 1448   CO2 27 02/25/2014 0913   CO2 24 12/31/2013 1448   BUN 12.4 02/25/2014 0913   BUN 9 12/31/2013 1448   CREATININE 0.8 02/25/2014 0913   CREATININE 0.83 12/31/2013 1448      Component Value Date/Time   CALCIUM 10.2 02/25/2014 0913   CALCIUM 8.8 12/31/2013 1448   ALKPHOS 74 02/25/2014 0913   ALKPHOS 70 06/04/2013 1511   AST 14 02/25/2014 0913   AST 12 06/04/2013 1511   ALT 29 02/25/2014 0913   ALT 14 06/04/2013 1511   BILITOT 0.51 02/25/2014 0913   BILITOT 0.5 06/04/2013 1511       No results found for: LABCA2  No components found for: LABCA125  No results for input(s): INR in the last 168 hours.  Urinalysis    Component Value Date/Time   COLORURINE YELLOW 06/04/2013 1420   APPEARANCEUR HAZY* 06/04/2013 1420   LABSPEC 1.030 01/27/2014 1350   LABSPEC >1.030* 06/04/2013 1420   PHURINE 6.0 06/04/2013 1420   GLUCOSEU Negative 01/27/2014 1350   GLUCOSEU  NEGATIVE 06/04/2013 1420   HGBUR MODERATE* 06/04/2013 1420   HGBUR negative 10/07/2009 1044   BILIRUBINUR NEGATIVE 06/04/2013 1420   KETONESUR NEGATIVE 06/04/2013 1420   PROTEINUR NEGATIVE 06/04/2013 1420   UROBILINOGEN Color Interference 01/27/2014 1350   UROBILINOGEN 0.2 06/04/2013 1420   NITRITE NEGATIVE 06/04/2013 1420   LEUKOCYTESUR NEGATIVE 06/04/2013 1420    STUDIES: No results found.  ASSESSMENT: 48 y.o. BRCA negative Jessica [name] status post left breast upper outer quadrant biopsy 11/29/2013 for a clinical T2 N0, stage IIA invasive ductal carcinoma, grade 1, triple positive, with an MIB-1 of 46%.  (1) MRI-guided biopsy of two additional areas in the left breast showed only tubular adenomas, no malignancy.   (2) neoadjuvant chemotherapy to start 01/14/2014, consisting of carboplatin, docetaxel, trastuzumab and pertuzumab given every 21 days for 6 cycles, with Neulasta on day 2-- echocardiogram 12/19/2013 showed an ejection fraction in the 50-55% range.  (3) trastuzumab to be continued to the total one year, through September of 2016  (4) definitive surgery to follow chemotherapy  (5) radiation to follow surgery  (6) antiestrogen therapy to follow radiation  PLAN: Zaraya is doing well today. The labs were reviewed in detail and are entirely stable. We will proceed with day 1, cycle 3 of carboplatin, docetaxel, trastuzumab, and pertuzumab today, with the neulasta injection tomorrow.   She has given me FMLA forms to be filled out and signed. I will be forwarding these to  Carmelina Noun.    Jessica [name] will return next week for her nadir visit. Cycle 4 will begin the week after that. I have put in orders for her midpoint breast MRI. She understands and agrees with this plan. She knows the goal of treatment in her case is cure. She has been encouraged to call with any issues that might arise before her next visit here.    Marcelino Duster, NP   02/25/2014 10:07 AM

## 2014-02-25 NOTE — Telephone Encounter (Signed)
per pof to sch pt appt-sent MW emailto sch trmt-sent Vaughan Basta email to sch ECHO-pt to get updated sch b4 leaving trmt room-adv pt once reply from pre-cert i will sch ECHO and call pt-pt understood

## 2014-02-25 NOTE — Patient Instructions (Signed)

## 2014-02-26 ENCOUNTER — Ambulatory Visit (HOSPITAL_BASED_OUTPATIENT_CLINIC_OR_DEPARTMENT_OTHER): Payer: Medicaid Other

## 2014-02-26 DIAGNOSIS — Z5189 Encounter for other specified aftercare: Secondary | ICD-10-CM

## 2014-02-26 DIAGNOSIS — C50412 Malignant neoplasm of upper-outer quadrant of left female breast: Secondary | ICD-10-CM

## 2014-02-26 MED ORDER — PEGFILGRASTIM INJECTION 6 MG/0.6ML ~~LOC~~
6.0000 mg | PREFILLED_SYRINGE | Freq: Once | SUBCUTANEOUS | Status: AC
  Administered 2014-02-26: 6 mg via SUBCUTANEOUS
  Filled 2014-02-26: qty 0.6

## 2014-02-26 NOTE — Patient Instructions (Signed)
Pegfilgrastim injection What is this medicine? PEGFILGRASTIM (peg fil GRA stim) is a long-acting granulocyte colony-stimulating factor that stimulates the growth of neutrophils, a type of white blood cell important in the body's fight against infection. It is used to reduce the incidence of fever and infection in patients with certain types of cancer who are receiving chemotherapy that affects the bone marrow. This medicine may be used for other purposes; ask your health care provider or pharmacist if you have questions. COMMON BRAND NAME(S): Neulasta What should I tell my health care provider before I take this medicine? They need to know if you have any of these conditions: -latex allergy -ongoing radiation therapy -sickle cell disease -skin reactions to acrylic adhesives (On-Body Injector only) -an unusual or allergic reaction to pegfilgrastim, filgrastim, other medicines, foods, dyes, or preservatives -pregnant or trying to get pregnant -breast-feeding How should I use this medicine? This medicine is for injection under the skin. If you get this medicine at home, you will be taught how to prepare and give the pre-filled syringe or how to use the On-body Injector. Refer to the patient Instructions for Use for detailed instructions. Use exactly as directed. Take your medicine at regular intervals. Do not take your medicine more often than directed. It is important that you put your used needles and syringes in a special sharps container. Do not put them in a trash can. If you do not have a sharps container, call your pharmacist or healthcare provider to get one. Talk to your pediatrician regarding the use of this medicine in children. Special care may be needed. Overdosage: If you think you have taken too much of this medicine contact a poison control center or emergency room at once. NOTE: This medicine is only for you. Do not share this medicine with others. What if I miss a dose? It is  important not to miss your dose. Call your doctor or health care professional if you miss your dose. If you miss a dose due to an On-body Injector failure or leakage, a new dose should be administered as soon as possible using a single prefilled syringe for manual use. What may interact with this medicine? Interactions have not been studied. Give your health care provider a list of all the medicines, herbs, non-prescription drugs, or dietary supplements you use. Also tell them if you smoke, drink alcohol, or use illegal drugs. Some items may interact with your medicine. This list may not describe all possible interactions. Give your health care provider a list of all the medicines, herbs, non-prescription drugs, or dietary supplements you use. Also tell them if you smoke, drink alcohol, or use illegal drugs. Some items may interact with your medicine. What should I watch for while using this medicine? You may need blood work done while you are taking this medicine. If you are going to need a MRI, CT scan, or other procedure, tell your doctor that you are using this medicine (On-Body Injector only). What side effects may I notice from receiving this medicine? Side effects that you should report to your doctor or health care professional as soon as possible: -allergic reactions like skin rash, itching or hives, swelling of the face, lips, or tongue -dizziness -fever -pain, redness, or irritation at site where injected -pinpoint red spots on the skin -shortness of breath or breathing problems -stomach or side pain, or pain at the shoulder -swelling -tiredness -trouble passing urine Side effects that usually do not require medical attention (report to your doctor   or health care professional if they continue or are bothersome): -bone pain -muscle pain This list may not describe all possible side effects. Call your doctor for medical advice about side effects. You may report side effects to FDA at  1-800-FDA-1088. Where should I keep my medicine? Keep out of the reach of children. Store pre-filled syringes in a refrigerator between 2 and 8 degrees C (36 and 46 degrees F). Do not freeze. Keep in carton to protect from light. Throw away this medicine if it is left out of the refrigerator for more than 48 hours. Throw away any unused medicine after the expiration date. NOTE: This sheet is a summary. It may not cover all possible information. If you have questions about this medicine, talk to your doctor, pharmacist, or health care provider.  2015, Elsevier/Gold Standard. (2013-07-04 16:14:05)  

## 2014-02-27 ENCOUNTER — Encounter: Payer: Self-pay | Admitting: Nurse Practitioner

## 2014-02-27 ENCOUNTER — Telehealth: Payer: Self-pay | Admitting: Nurse Practitioner

## 2014-02-27 NOTE — Progress Notes (Signed)
Put fmla form in registration desk °

## 2014-02-27 NOTE — Telephone Encounter (Signed)
per [facility] NPR cld [id number] n/a 11/11&11/12-left message to sch ECHO for 12/1 & 9 or 10-adv to call Webb Silversmith [id number] to call pt for me to adv of appt time for ECHO

## 2014-02-28 ENCOUNTER — Telehealth: Payer: Self-pay | Admitting: Nurse Practitioner

## 2014-02-28 NOTE — Telephone Encounter (Signed)
, °

## 2014-03-04 ENCOUNTER — Other Ambulatory Visit (HOSPITAL_BASED_OUTPATIENT_CLINIC_OR_DEPARTMENT_OTHER): Payer: Medicaid Other

## 2014-03-04 ENCOUNTER — Other Ambulatory Visit: Payer: Self-pay | Admitting: *Deleted

## 2014-03-04 ENCOUNTER — Ambulatory Visit (HOSPITAL_BASED_OUTPATIENT_CLINIC_OR_DEPARTMENT_OTHER): Payer: Medicaid Other | Admitting: Nurse Practitioner

## 2014-03-04 ENCOUNTER — Encounter: Payer: Self-pay | Admitting: Nurse Practitioner

## 2014-03-04 VITALS — BP 128/80 | HR 80 | Temp 98.4°F | Resp 18 | Ht 65.0 in | Wt 196.3 lb

## 2014-03-04 DIAGNOSIS — C50412 Malignant neoplasm of upper-outer quadrant of left female breast: Secondary | ICD-10-CM | POA: Diagnosis present

## 2014-03-04 DIAGNOSIS — K1231 Oral mucositis (ulcerative) due to antineoplastic therapy: Secondary | ICD-10-CM

## 2014-03-04 DIAGNOSIS — R197 Diarrhea, unspecified: Secondary | ICD-10-CM

## 2014-03-04 DIAGNOSIS — B370 Candidal stomatitis: Secondary | ICD-10-CM | POA: Diagnosis not present

## 2014-03-04 DIAGNOSIS — M62830 Muscle spasm of back: Secondary | ICD-10-CM | POA: Diagnosis not present

## 2014-03-04 LAB — COMPREHENSIVE METABOLIC PANEL (CC13)
ALK PHOS: 94 U/L (ref 40–150)
ALT: 38 U/L (ref 0–55)
AST: 17 U/L (ref 5–34)
Albumin: 4.3 g/dL (ref 3.5–5.0)
Anion Gap: 11 mEq/L (ref 3–11)
BILIRUBIN TOTAL: 0.58 mg/dL (ref 0.20–1.20)
BUN: 13.7 mg/dL (ref 7.0–26.0)
CO2: 25 mEq/L (ref 22–29)
CREATININE: 1 mg/dL (ref 0.6–1.1)
Calcium: 9.9 mg/dL (ref 8.4–10.4)
Chloride: 102 mEq/L (ref 98–109)
Glucose: 116 mg/dl (ref 70–140)
POTASSIUM: 4 meq/L (ref 3.5–5.1)
Sodium: 139 mEq/L (ref 136–145)
Total Protein: 7.4 g/dL (ref 6.4–8.3)

## 2014-03-04 LAB — CBC WITH DIFFERENTIAL/PLATELET
BASO%: 0.2 % (ref 0.0–2.0)
Basophils Absolute: 0 10*3/uL (ref 0.0–0.1)
EOS%: 0.3 % (ref 0.0–7.0)
Eosinophils Absolute: 0 10*3/uL (ref 0.0–0.5)
HCT: 38.1 % (ref 34.8–46.6)
HGB: 13.2 g/dL (ref 11.6–15.9)
LYMPH%: 16.2 % (ref 14.0–49.7)
MCH: 33.4 pg (ref 25.1–34.0)
MCHC: 34.6 g/dL (ref 31.5–36.0)
MCV: 96.5 fL (ref 79.5–101.0)
MONO#: 0.8 10*3/uL (ref 0.1–0.9)
MONO%: 8.6 % (ref 0.0–14.0)
NEUT#: 6.8 10*3/uL — ABNORMAL HIGH (ref 1.5–6.5)
NEUT%: 74.7 % (ref 38.4–76.8)
Platelets: 152 10*3/uL (ref 145–400)
RBC: 3.95 10*6/uL (ref 3.70–5.45)
RDW: 15.6 % — AB (ref 11.2–14.5)
WBC: 9.2 10*3/uL (ref 3.9–10.3)
lymph#: 1.5 10*3/uL (ref 0.9–3.3)

## 2014-03-04 MED ORDER — NYSTATIN 100000 UNIT/ML MT SUSP: 5.0000 mL | Freq: Four times a day (QID) | OROMUCOSAL | Status: DC | PRN

## 2014-03-04 MED ORDER — LORATADINE 10 MG PO TABS: 10.0000 mg | ORAL_TABLET | ORAL | Status: DC | PRN

## 2014-03-04 MED ORDER — CYCLOBENZAPRINE HCL 5 MG PO TABS: 5.0000 mg | ORAL_TABLET | Freq: Two times a day (BID) | ORAL | Status: DC | PRN

## 2014-03-04 NOTE — Progress Notes (Signed)
[place]  Telephone:([phone]) [phone] Fax:(336) [phone]     ID: Jessica [name] DOB: January 03, 1966  MR#: [id number]  ALP#:[id number]  Patient Care Team: [name], MD as PCP - General (Nurse Practitioner) [name], MD as Consulting Physician (General Surgery) [name], MD as Consulting Physician (Oncology) [name], MD as Consulting Physician (Radiation Oncology)  CHIEF COMPLAINT: Newly diagnosed triple positive breast cancer CURRENT TREATMENT: neoadjuvant chemo-immunotherapy  BREAST CANCER HISTORY: From the original intake note:  [name] herself palpated a mass in her left breast. She brought it to the attention of [name] at the health department and she was set up for unilateral left mammography and ultrasound 11/27/2013 at the [facility]. This showed a possible distortion in the left breast upper outer quadrant. However the patient's breasts are density category D. On exam there was a firm nodule or mass in the left breast 2:00 location which by ultrasound was irregular and hypoechoic and measured 1.5 cm. There was no left axillary lymphadenopathy noted.  Biopsy of the mass in question 11/29/2013 showed ([id number]) an invasive ductal carcinoma, grade 1, estrogen receptor 94% positive, progesterone receptor 94% positive, both with strong staining intensity, with an MIB-1 of 46%, and HER-2 amplified by CISH, the signals ratio of 4.85 and the number per cell 6.55.  : 12/08/2013 the patient underwent bilateral breast MRI. This showed in the left breast upper outer quadrant a 2.9 cm irregular enhancing mass and extending posteriorly from this an area of clumped non-masslike enhancement extending approximately 6 cm and leading to a posterior group of nodules which were small but measured in aggregate 2.2 cm. In addition, there was a separate 0.8 cm irregular spiculated enhancing mass in the upper inner aspect of the left breast. In the  lower outer left breast there was a 0.8 cm oval enhancing mass. There were no abnormal appearing lymph nodes.  [name] subsequent history is as detailed below  INTERVAL HISTORY: Jessica [name] returns today for follow up of her breast cancer, accompanied by her husband, [name]. Today is day 8, cycle 3 of 6 planned cycles of carboplatin, docetaxel, pertuzumab, and trastuzumab to be given every 3 weeks.   Ryiah says this week was "rough." On days 1-3 she experienced constipation, and used miralax daily. Days 4-6 she experienced 4-5 loose bowel movements per day. She took imodium but this was not entirely effective. She still has not tried the [place] powder. She took claratin and vicodin for her back pain after the neulasta injection, but experienced breakthrough muscle spasms that still haven't gone away. This keeps her up at night. Her appetite is good, but she is unable to satisfy it because her sense of taste has made the things she enjoys taste bad. She is staying well hydrated and drinking pedialyte. She denies fevers, chills, nausea, or vomiting. Jessica [name] has reappeared on her tongue starting yesterday and she is using the nystatin mouth wash. She is experiencing sinus related ear pressure. The numbness and tingling to her bilateral fingers is unchanged.   REVIEW OF SYSTEMS: A detailed review of systems is otherwise noncontributory, except where noted above.    PAST MEDICAL HISTORY: Past Medical History  Diagnosis Date  . Anxiety   . COPD (chronic obstructive pulmonary disease) 2011    does not use inhaler ofter  . Depression   . Hot flashes   . Family history of anesthesia complication     Had a hard time waking father up only once after  several procedures  . Shortness of breath     with exertion  . Heart murmur     PMH: at age 48; no longer have murmur  . Chronic heartburn   . PVC (premature ventricular contraction)     PMH;at 48 y.o.  . Cancer     DCIS  . Asthma     denies history  of asthma    PAST SURGICAL HISTORY: Past Surgical History  Procedure Laterality Date  . Polyp removal     . Tonsillectomy    . Wisdom tooth extraction    . Robotic assisted total hysterectomy with bilateral salpingo oopherectomy Bilateral 05/24/2013    Procedure: ROBOTIC ASSISTED TOTAL HYSTERECTOMY WITH BILATERAL SALPINGECTOMY;  Surgeon: Lavonia Drafts, MD;  Location: [facility];  Service: Gynecology;  Laterality: Bilateral;  . Cystoscopy N/A 05/24/2013    Procedure: CYSTOSCOPY;  Surgeon: Lavonia Drafts, MD;  Location: [facility];  Service: Gynecology;  Laterality: N/A;  . Abdominal hysterectomy    . Breast surgery      left breast biopsy x 2  . Portacath placement Right 01/02/2014    Procedure: INSERTION PORT-A-CATH;  Surgeon: [name], MD;  Location: [facility];  Service: General;  Laterality: Right;    FAMILY HISTORY Family History  Problem Relation Age of Onset  . Heart disease Mother   . Hypercholesterolemia Mother   . Heart disease Father   . COPD Father   . Hypercholesterolemia Father   . Cancer Neg Hx   [name] parents are still living, her father is 72 years old and her mother 19 years old as of August 2015. The patient had no brothers, one sister. There is no history of breast or ovarian cancer in the family.  GYNECOLOGIC HISTORY:  Patient's last menstrual period was 03/18/2013. Menarche age 48. She is GX P0. She took oral contraceptives for many years as well as Depo Provera shots. She is status post hysterectomy with bilateral salpingectomy. 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 She scans at work, and she also does all this she'll vein and stocking. She is single but for the last 11 years as lives with her significant other [name]. He is disabled secondary to multiple orthopedic problems including bilateral avascular necrosis of the hips. Both of them do smoke. The patient is here for drinks on  weekends    ADVANCED DIRECTIVES: Not in place    HEALTH MAINTENANCE: History  Substance Use Topics  . Smoking status: Current Every Day Smoker -- 1.00 packs/day for 16 years    Types: Cigarettes  . Smokeless tobacco: Never Used  . Alcohol Use: Yes     Comment: occasional      Colonoscopy:  PAP: January 2015   Bone density:  Lipid panel:  No Known Allergies  Current Outpatient Prescriptions  Medication Sig Dispense Refill  . budesonide-formoterol (SYMBICORT) 160-4.5 MCG/ACT inhaler Inhale 1 puff into the lungs 2 (two) times daily.     . calcium carbonate (TUMS - DOSED IN MG ELEMENTAL CALCIUM) 500 MG chewable tablet Chew 1 tablet by mouth daily.    Marland Kitchen dexamethasone (DECADRON) 4 MG tablet Take 2 tablets (8 mg total) by mouth 2 (two) times daily. Start the day before Taxotere. Then again the day after chemo for 3 days. 30 tablet 1  . fluticasone (FLONASE) 50 MCG/ACT nasal spray Place 2 sprays into both nostrils daily. 16 g 2  . gabapentin (NEURONTIN) 600  MG tablet Take 600 mg by mouth at bedtime as needed (anxiety).     Marland Kitchen HYDROcodone-acetaminophen (NORCO/VICODIN) 5-325 MG per tablet Take 1-2 tablets by mouth every 6 (six) hours as needed for moderate pain. 30 tablet 0  . lidocaine-prilocaine (EMLA) cream Apply 1 application topically as needed. Apply over port site 1-2 hours before chemotherapy, cover with plastic wrap 30 g 0  . loperamide (IMODIUM) 2 MG capsule TAKE ONE TABLET AFTER EACH LOOSE STOOL. MAXIMUM OF SIX TABLETS A DAY. CHEMOTHERAPY INDUCED DIARRHEA. 60 capsule 3  . loratadine (CLARITIN) 10 MG tablet Take 1 tablet (10 mg total) by mouth as needed. 30 tablet 1  . LORazepam (ATIVAN) 0.5 MG tablet Take 1 tablet (0.5 mg total) by mouth at bedtime as needed (Nausea or vomiting). 30 tablet 0  . nystatin (MYCOSTATIN) 100000 UNIT/ML suspension Take 5 mLs (500,000 Units total) by mouth 4 (four) times daily as needed (Swish and spit). 60 mL 0  . omeprazole (PRILOSEC) 40 MG capsule Take  1 capsule (40 mg total) by mouth daily. 30 capsule 2  . ondansetron (ZOFRAN) 8 MG tablet Take 1 tablet (8 mg total) by mouth 2 (two) times daily. Start the day after chemo for 3 days. Then take as needed for nausea or vomiting. 30 tablet 1  . tobramycin-dexamethasone (TOBRADEX) ophthalmic solution Place 1 drop into both eyes 2 (two) times daily. 5 mL 0  . traZODone (DESYREL) 50 MG tablet Take 1 tablet (50 mg total) by mouth at bedtime as needed for sleep. 30 tablet 3  . cholestyramine (QUESTRAN) 4 G packet Take 1 packet (4 g total) by mouth 3 (three) times daily. 60 each 3  . ciprofloxacin (CIPRO) 500 MG tablet Take 1 tablet (500 mg total) by mouth 2 (two) times daily. 14 tablet 0  . cyclobenzaprine (FLEXERIL) 5 MG tablet Take 1 tablet (5 mg total) by mouth 2 (two) times daily as needed for muscle spasms. 60 tablet 1  . fluconazole (DIFLUCAN) 100 MG tablet Take 1 tablet (100 mg total) by mouth daily. 7 tablet 0  . ibuprofen (ADVIL,MOTRIN) 800 MG tablet Take 800 mg by mouth every 8 (eight) hours as needed (pain).    Marland Kitchen ipratropium (ATROVENT HFA) 17 MCG/ACT inhaler Inhale 1 puff into the lungs every 6 (six) hours as needed for wheezing (SOB).     Marland Kitchen prochlorperazine (COMPAZINE) 10 MG tablet Take 1 tablet (10 mg total) by mouth every 6 (six) hours as needed (Nausea or vomiting). 30 tablet 1   No current facility-administered medications for this visit.    OBJECTIVE: middle-aged white woman in no acute distress Filed Vitals:   03/04/14 0922  BP: 128/80  Pulse: 80  Temp: 98.4 F (36.9 C)  Resp: 18     Body mass index is 32.67 kg/(m^2).    ECOG FS:1 - Symptomatic but completely ambulatory  Sclerae unicteric, pupils equal and reactive Thrush coating to tongue and buccal mucosa Bilateral TM pearly, right TM slightly bulging, no erythema, no excessive cerumen  No cervical or supraclavicular adenopathy Lungs no rales or rhonchi Heart regular rate and rhythm Abd soft, nontender, positive bowel  sounds MSK no focal spinal tenderness, no upper extremity lymphedema Neuro: nonfocal, well oriented, appropriate affect Breasts: deferred  LAB RESULTS:  CMP     Component Value Date/Time   NA 139 03/04/2014 0846   NA 139 12/31/2013 1448   K 4.0 03/04/2014 0846   K 4.6 12/31/2013 1448   CL 103 12/31/2013 1448  CO2 25 03/04/2014 0846   CO2 24 12/31/2013 1448   GLUCOSE 116 03/04/2014 0846   GLUCOSE 93 12/31/2013 1448   BUN 13.7 03/04/2014 0846   BUN 9 12/31/2013 1448   CREATININE 1.0 03/04/2014 0846   CREATININE 0.83 12/31/2013 1448   CALCIUM 9.9 03/04/2014 0846   CALCIUM 8.8 12/31/2013 1448   PROT 7.4 03/04/2014 0846   PROT 7.1 06/04/2013 1511   ALBUMIN 4.3 03/04/2014 0846   ALBUMIN 3.4* 06/04/2013 1511   AST 17 03/04/2014 0846   AST 12 06/04/2013 1511   ALT 38 03/04/2014 0846   ALT 14 06/04/2013 1511   ALKPHOS 94 03/04/2014 0846   ALKPHOS 70 06/04/2013 1511   BILITOT 0.58 03/04/2014 0846   BILITOT 0.5 06/04/2013 1511   GFRNONAA 83* 12/31/2013 1448   GFRAA >90 12/31/2013 1448    I No results found for: SPEP  Lab Results  Component Value Date   WBC 9.2 03/04/2014   NEUTROABS 6.8* 03/04/2014   HGB 13.2 03/04/2014   HCT 38.1 03/04/2014   MCV 96.5 03/04/2014   PLT 152 03/04/2014      Chemistry      Component Value Date/Time   NA 139 03/04/2014 0846   NA 139 12/31/2013 1448   K 4.0 03/04/2014 0846   K 4.6 12/31/2013 1448   CL 103 12/31/2013 1448   CO2 25 03/04/2014 0846   CO2 24 12/31/2013 1448   BUN 13.7 03/04/2014 0846   BUN 9 12/31/2013 1448   CREATININE 1.0 03/04/2014 0846   CREATININE 0.83 12/31/2013 1448      Component Value Date/Time   CALCIUM 9.9 03/04/2014 0846   CALCIUM 8.8 12/31/2013 1448   ALKPHOS 94 03/04/2014 0846   ALKPHOS 70 06/04/2013 1511   AST 17 03/04/2014 0846   AST 12 06/04/2013 1511   ALT 38 03/04/2014 0846   ALT 14 06/04/2013 1511   BILITOT 0.58 03/04/2014 0846   BILITOT 0.5 06/04/2013 1511       No results found  for: LABCA2  No components found for: PPIRJ188  No results for input(s): INR in the last 168 hours.  Urinalysis    Component Value Date/Time   COLORURINE YELLOW 06/04/2013 1420   APPEARANCEUR HAZY* 06/04/2013 1420   LABSPEC 1.030 01/27/2014 1350   LABSPEC >1.030* 06/04/2013 1420   PHURINE 6.0 06/04/2013 1420   GLUCOSEU Negative 01/27/2014 1350   GLUCOSEU NEGATIVE 06/04/2013 1420   HGBUR MODERATE* 06/04/2013 1420   HGBUR negative 10/07/2009 1044   BILIRUBINUR NEGATIVE 06/04/2013 1420   KETONESUR NEGATIVE 06/04/2013 1420   PROTEINUR NEGATIVE 06/04/2013 1420   UROBILINOGEN Color Interference 01/27/2014 1350   UROBILINOGEN 0.2 06/04/2013 1420   NITRITE NEGATIVE 06/04/2013 1420   LEUKOCYTESUR NEGATIVE 06/04/2013 1420    STUDIES: No results found.  ASSESSMENT: 48 y.o. BRCA negative [place] woman status post left breast upper outer quadrant biopsy 11/29/2013 for a clinical T2 N0, stage IIA invasive ductal carcinoma, grade 1, triple positive, with an MIB-1 of 46%.  (1) MRI-guided biopsy of two additional areas in the left breast showed only tubular adenomas, no malignancy.   (2) neoadjuvant chemotherapy to start 01/14/2014, consisting of carboplatin, docetaxel, trastuzumab and pertuzumab given every 21 days for 6 cycles, with Neulasta on day 2-- echocardiogram 12/19/2013 showed an ejection fraction in the 50-55% range.  (3) trastuzumab to be continued to the total one year, through September of 2016  (4) definitive surgery to follow chemotherapy  (5) radiation to follow surgery  (6) antiestrogen therapy  to follow radiation  PLAN: The labs were reviewed in detail and were entirely stable. Aara and I spent about 20 minutes discussing her various complaints. I advised the following:   Constipation/diarrhea: use miralax on day 1 and day 2, nothing on day 3, then questran powder at least daily on days 4, 5, and 6 with imodium PRN. Stay well hydrated on all days .  Lower back  pain/spasm: continue claratin daily x 5 days after injection. vicodin PRN pain greater than 5/10. I have sent a new prescription for flexeril 68m BIDPRN. Patient knows not to drive or operate heavy machinery while on these medications  Thrush: continue to swish/spit nystatin suspension QID.   Sinus symptoms: claratin daily. nasocort BID PRN.   LMurlwill return in 2 weeks for the start of cycle 4 of carboplatin, docetaxel, trastuzumab, and pertuzumab. She has a repeat echocardiogram scheduled for 12/1. Her midpoint breast MRI will occur after the completion of cycle 4. Lauretta understands and agrees with this plan. She knows the goal of treatment in her case is cure. She has been encouraged to call with any issues that might arise before her next visit here.    FMarcelino Duster NP   03/04/2014 9:46 AM

## 2014-03-07 ENCOUNTER — Other Ambulatory Visit: Payer: Self-pay | Admitting: *Deleted

## 2014-03-07 DIAGNOSIS — J019 Acute sinusitis, unspecified: Secondary | ICD-10-CM

## 2014-03-07 MED ORDER — HYDROCODONE-ACETAMINOPHEN 5-325 MG PO TABS: 1.0000 | ORAL_TABLET | Freq: Four times a day (QID) | ORAL | Status: DC | PRN

## 2014-03-12 ENCOUNTER — Telehealth: Payer: Self-pay | Admitting: *Deleted

## 2014-03-12 DIAGNOSIS — M62830 Muscle spasm of back: Secondary | ICD-10-CM

## 2014-03-12 DIAGNOSIS — C50412 Malignant neoplasm of upper-outer quadrant of left female breast: Secondary | ICD-10-CM

## 2014-03-12 NOTE — Telephone Encounter (Signed)
Pt called this RN to state concern for ongoing back pain unrelieved with flexeril per HF/NP.  Pain is in lower back- starts in center of back then radiates out.  Pain is aggravated with standing and improves with sitting.  Pain is not radiating down legs.  Urine and bowels are moving regular for pt.  Per MD review recommendation is for gabapentin to be increased to tid at 300mg  dose during the day.  Plain films of back ordered with next office visit.  Pt also has noted pain in lower left abdominal pain " like 3 inches below my belly button"  Area is tender to touch.  Per discussion pt has history of right ovarian cyst " but this is on the left side "  Pt is also post hysterectomy and is wondering " is this where the cuff is located ?"  This RN discussed above may be a cyst now on her left ovary if she is prone to them.  Pt understands plan with back pain and will monitor lower abd pain and call if needed.

## 2014-03-18 ENCOUNTER — Other Ambulatory Visit: Payer: Self-pay | Admitting: *Deleted

## 2014-03-18 ENCOUNTER — Ambulatory Visit (HOSPITAL_BASED_OUTPATIENT_CLINIC_OR_DEPARTMENT_OTHER): Payer: Medicaid Other

## 2014-03-18 ENCOUNTER — Other Ambulatory Visit (HOSPITAL_BASED_OUTPATIENT_CLINIC_OR_DEPARTMENT_OTHER): Payer: Medicaid Other

## 2014-03-18 ENCOUNTER — Other Ambulatory Visit: Payer: Medicaid Other

## 2014-03-18 ENCOUNTER — Ambulatory Visit (HOSPITAL_COMMUNITY): Admission: RE | Admit: 2014-03-18 | Payer: Medicaid Other | Source: Ambulatory Visit

## 2014-03-18 ENCOUNTER — Ambulatory Visit (HOSPITAL_BASED_OUTPATIENT_CLINIC_OR_DEPARTMENT_OTHER): Payer: Medicaid Other | Admitting: Nurse Practitioner

## 2014-03-18 ENCOUNTER — Ambulatory Visit: Payer: Medicaid Other

## 2014-03-18 ENCOUNTER — Encounter: Payer: Self-pay | Admitting: Nurse Practitioner

## 2014-03-18 VITALS — BP 134/75 | HR 85 | Temp 98.0°F | Resp 18 | Ht 65.0 in | Wt 201.7 lb

## 2014-03-18 DIAGNOSIS — C50412 Malignant neoplasm of upper-outer quadrant of left female breast: Secondary | ICD-10-CM

## 2014-03-18 DIAGNOSIS — M545 Low back pain, unspecified: Secondary | ICD-10-CM

## 2014-03-18 DIAGNOSIS — R1032 Left lower quadrant pain: Secondary | ICD-10-CM

## 2014-03-18 DIAGNOSIS — Z5111 Encounter for antineoplastic chemotherapy: Secondary | ICD-10-CM

## 2014-03-18 DIAGNOSIS — Z5112 Encounter for antineoplastic immunotherapy: Secondary | ICD-10-CM

## 2014-03-18 DIAGNOSIS — Z95828 Presence of other vascular implants and grafts: Secondary | ICD-10-CM

## 2014-03-18 DIAGNOSIS — Z720 Tobacco use: Secondary | ICD-10-CM

## 2014-03-18 DIAGNOSIS — M454 Ankylosing spondylitis of thoracic region: Secondary | ICD-10-CM

## 2014-03-18 DIAGNOSIS — Z170 Estrogen receptor positive status [ER+]: Secondary | ICD-10-CM

## 2014-03-18 LAB — COMPREHENSIVE METABOLIC PANEL (CC13)
ALBUMIN: 3.7 g/dL (ref 3.5–5.0)
ALK PHOS: 75 U/L (ref 40–150)
ALT: 29 U/L (ref 0–55)
AST: 20 U/L (ref 5–34)
Anion Gap: 10 mEq/L (ref 3–11)
BILIRUBIN TOTAL: 0.52 mg/dL (ref 0.20–1.20)
BUN: 9.9 mg/dL (ref 7.0–26.0)
CO2: 23 mEq/L (ref 22–29)
Calcium: 9.4 mg/dL (ref 8.4–10.4)
Chloride: 107 mEq/L (ref 98–109)
Creatinine: 0.9 mg/dL (ref 0.6–1.1)
Glucose: 124 mg/dl (ref 70–140)
POTASSIUM: 3.3 meq/L — AB (ref 3.5–5.1)
Sodium: 140 mEq/L (ref 136–145)
TOTAL PROTEIN: 6.4 g/dL (ref 6.4–8.3)

## 2014-03-18 LAB — CBC WITH DIFFERENTIAL/PLATELET
BASO%: 0.2 % (ref 0.0–2.0)
BASOS ABS: 0 10*3/uL (ref 0.0–0.1)
EOS ABS: 0 10*3/uL (ref 0.0–0.5)
EOS%: 0.6 % (ref 0.0–7.0)
HCT: 35.1 % (ref 34.8–46.6)
HEMOGLOBIN: 11.8 g/dL (ref 11.6–15.9)
LYMPH%: 21.1 % (ref 14.0–49.7)
MCH: 33.4 pg (ref 25.1–34.0)
MCHC: 33.6 g/dL (ref 31.5–36.0)
MCV: 99.4 fL (ref 79.5–101.0)
MONO#: 0.3 10*3/uL (ref 0.1–0.9)
MONO%: 6.1 % (ref 0.0–14.0)
NEUT%: 72 % (ref 38.4–76.8)
NEUTROS ABS: 3.9 10*3/uL (ref 1.5–6.5)
PLATELETS: 115 10*3/uL — AB (ref 145–400)
RBC: 3.53 10*6/uL — ABNORMAL LOW (ref 3.70–5.45)
RDW: 17.7 % — ABNORMAL HIGH (ref 11.2–14.5)
WBC: 5.4 10*3/uL (ref 3.9–10.3)
lymph#: 1.2 10*3/uL (ref 0.9–3.3)

## 2014-03-18 MED ORDER — DEXAMETHASONE SODIUM PHOSPHATE 20 MG/5ML IJ SOLN
INTRAMUSCULAR | Status: AC
  Filled 2014-03-18: qty 5

## 2014-03-18 MED ORDER — SODIUM CHLORIDE 0.9 % IV SOLN
720.0000 mg | Freq: Once | INTRAVENOUS | Status: AC
  Administered 2014-03-18: 720 mg via INTRAVENOUS
  Filled 2014-03-18: qty 72

## 2014-03-18 MED ORDER — SODIUM CHLORIDE 0.9 % IJ SOLN
10.0000 mL | INTRAMUSCULAR | Status: DC | PRN
  Administered 2014-03-18: 10 mL via INTRAVENOUS
  Filled 2014-03-18: qty 10

## 2014-03-18 MED ORDER — SODIUM CHLORIDE 0.9 % IJ SOLN
10.0000 mL | INTRAMUSCULAR | Status: DC | PRN
  Administered 2014-03-18: 10 mL
  Filled 2014-03-18: qty 10

## 2014-03-18 MED ORDER — DIPHENHYDRAMINE HCL 25 MG PO CAPS
ORAL_CAPSULE | ORAL | Status: AC
  Filled 2014-03-18: qty 1

## 2014-03-18 MED ORDER — SODIUM CHLORIDE 0.9 % IV SOLN
420.0000 mg | Freq: Once | INTRAVENOUS | Status: AC
  Administered 2014-03-18: 420 mg via INTRAVENOUS
  Filled 2014-03-18: qty 14

## 2014-03-18 MED ORDER — HEPARIN SOD (PORK) LOCK FLUSH 100 UNIT/ML IV SOLN
500.0000 [IU] | Freq: Once | INTRAVENOUS | Status: AC | PRN
  Administered 2014-03-18: 500 [IU]
  Filled 2014-03-18: qty 5

## 2014-03-18 MED ORDER — ONDANSETRON 16 MG/50ML IVPB (CHCC)
INTRAVENOUS | Status: AC
  Filled 2014-03-18: qty 16

## 2014-03-18 MED ORDER — ACETAMINOPHEN 325 MG PO TABS
ORAL_TABLET | ORAL | Status: AC
  Filled 2014-03-18: qty 2

## 2014-03-18 MED ORDER — TRASTUZUMAB CHEMO INJECTION 440 MG
6.0000 mg/kg | Freq: Once | INTRAVENOUS | Status: AC
  Administered 2014-03-18: 525 mg via INTRAVENOUS
  Filled 2014-03-18: qty 25

## 2014-03-18 MED ORDER — DOCETAXEL CHEMO INJECTION 160 MG/16ML
75.0000 mg/m2 | Freq: Once | INTRAVENOUS | Status: AC
  Administered 2014-03-18: 150 mg via INTRAVENOUS
  Filled 2014-03-18: qty 15

## 2014-03-18 MED ORDER — DIPHENHYDRAMINE HCL 25 MG PO CAPS
25.0000 mg | ORAL_CAPSULE | Freq: Once | ORAL | Status: AC
  Administered 2014-03-18: 25 mg via ORAL

## 2014-03-18 MED ORDER — ONDANSETRON 16 MG/50ML IVPB (CHCC)
16.0000 mg | Freq: Once | INTRAVENOUS | Status: AC
  Administered 2014-03-18: 16 mg via INTRAVENOUS

## 2014-03-18 MED ORDER — DEXAMETHASONE SODIUM PHOSPHATE 20 MG/5ML IJ SOLN
20.0000 mg | Freq: Once | INTRAMUSCULAR | Status: AC
  Administered 2014-03-18: 20 mg via INTRAVENOUS

## 2014-03-18 MED ORDER — ACETAMINOPHEN 325 MG PO TABS
650.0000 mg | ORAL_TABLET | Freq: Once | ORAL | Status: AC
  Administered 2014-03-18: 650 mg via ORAL

## 2014-03-18 MED ORDER — SODIUM CHLORIDE 0.9 % IV SOLN
Freq: Once | INTRAVENOUS | Status: AC
  Administered 2014-03-18: 14:00:00 via INTRAVENOUS

## 2014-03-18 NOTE — Progress Notes (Signed)
Jessica [name]  [phone] [phone]     ID: Jessica [name] DOB: 1966-02-06  MR#: [id number]  MWN#:[id number]  Patient Care Team: [name], MD as PCP - General (Nurse Practitioner) [name], MD as Consulting Physician (General Surgery) [name], MD as Consulting Physician (Oncology) [name], MD as Consulting Physician (Radiation Oncology)  CHIEF COMPLAINT: Newly diagnosed triple positive breast cancer CURRENT TREATMENT: neoadjuvant chemo-immunotherapy  BREAST CANCER HISTORY: From the original intake note:  Jessica [name] herself palpated a mass in her left breast. She brought it to the attention of Jessica [name] at the health department and she was set up for unilateral left mammography and ultrasound 11/27/2013 at the [facility]. This showed a possible distortion in the left breast upper outer quadrant. However the patient's breasts are density category D. On exam there was a firm nodule or mass in the left breast 2:00 location which by ultrasound was irregular and hypoechoic and measured 1.5 cm. There was no left axillary lymphadenopathy noted.  Biopsy of the mass in question 11/29/2013 showed ([id number]) an invasive ductal carcinoma, grade 1, estrogen receptor 94% positive, progesterone receptor 94% positive, both with strong staining intensity, with an MIB-1 of 46%, and HER-2 amplified by CISH, the signals ratio of 4.85 and the number per cell 6.55.  : 12/08/2013 the patient underwent bilateral breast MRI. This showed in the left breast upper outer quadrant a 2.9 cm irregular enhancing mass and extending posteriorly from this an area of clumped non-masslike enhancement extending approximately 6 cm and leading to a posterior group of nodules which were small but measured in aggregate 2.2 cm. In addition, there was a separate 0.8 cm irregular spiculated enhancing mass in the upper inner aspect of the left breast. In the  lower outer left breast there was a 0.8 cm oval enhancing mass. There were no abnormal appearing lymph nodes.  [name] subsequent history is as detailed below  INTERVAL HISTORY: Jessica [name] returns today for follow up of her breast cancer, accompanied by her husband, Jessica [name]. Today is [name] 1, cycle 4 of 6 planned cycles of carboplatin, docetaxel, pertuzumab, and trastuzumab to be given every 3 weeks.   Jessica [name] is doing moderately well today. She has had no fevers, chills, nausea, or vomiting. Her diarrhea has resolved with the use of questran powder daily. Her appetite is healthy and she is staying well hydrated. Her thrush resolved completely with the use of nystatin mouthwash. She complains of more fatigue with this past treatment. Her main complaint this week is lower back pain unrelieved by ibuprofen and flexeril. It starts in the center of there back and radiates outward but not down her legs. The pain is aggravated with standing and improves with sitting. She also has mild pain to her left lower abdomen over her left ovary. She has a history of a left ovarian cyst. She is status post hysterectomy and has not seen any vaginal bleeding.  REVIEW OF SYSTEMS: A detailed review of systems is otherwise noncontributory, except where noted above.    PAST MEDICAL HISTORY: Past Medical History  Diagnosis Date  . Anxiety   . COPD (chronic obstructive pulmonary disease) 2011    does not use inhaler ofter  . Depression   . Hot flashes   . Family history of anesthesia complication     Had a hard time waking father up only once after several procedures  . Shortness of breath     with exertion  .  Heart murmur     PMH: at age 7; no longer have murmur  . Chronic heartburn   . PVC (premature ventricular contraction)     PMH;at 48 y.o.  . Cancer     DCIS  . Asthma     denies history of asthma    PAST SURGICAL HISTORY: Past Surgical History  Procedure Laterality Date  . Polyp removal     .  Tonsillectomy    . Wisdom tooth extraction    . Robotic assisted total hysterectomy with bilateral salpingo oopherectomy Bilateral 05/24/2013    Procedure: ROBOTIC ASSISTED TOTAL HYSTERECTOMY WITH BILATERAL SALPINGECTOMY;  Surgeon: Lavonia Drafts, MD;  Location: [facility];  Service: Gynecology;  Laterality: Bilateral;  . Cystoscopy N/A 05/24/2013    Procedure: CYSTOSCOPY;  Surgeon: Lavonia Drafts, MD;  Location: [facility];  Service: Gynecology;  Laterality: N/A;  . Abdominal hysterectomy    . Breast surgery      left breast biopsy x 2  . Portacath placement Right 01/02/2014    Procedure: INSERTION PORT-A-CATH;  Surgeon: [name], MD;  Location: [facility];  Service: General;  Laterality: Right;    FAMILY HISTORY Family History  Problem Relation Age of Onset  . Heart disease Mother   . Hypercholesterolemia Mother   . Heart disease Father   . COPD Father   . Hypercholesterolemia Father   . Cancer Neg Hx   [name] parents are still living, her father is 42 years old and her mother 27 years old as of August 2015. The patient had no brothers, one sister. There is no history of breast or ovarian cancer in the family.  GYNECOLOGIC HISTORY:  Patient's last menstrual period was 03/18/2013. Menarche age 37. She is GX P0. She took oral contraceptives for many years as well as Depo Provera shots. She is status post hysterectomy with bilateral salpingectomy. 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 She scans at work, and she also does all this she'll vein and stocking. She is single but for the last 11 years as lives with her significant other Jessica [name]. He is disabled secondary to multiple orthopedic problems including bilateral avascular necrosis of the hips. Both of them do smoke. The patient is here for drinks on weekends    ADVANCED DIRECTIVES: Not in place    HEALTH MAINTENANCE: History  Substance Use Topics  . Smoking  status: Current Every [name] Smoker -- 1.00 packs/[name] for 16 years    Types: Cigarettes  . Smokeless tobacco: Never Used  . Alcohol Use: Yes     Comment: occasional      Colonoscopy:  PAP: January 2015   Bone density:  Lipid panel:  No Known Allergies  Current Outpatient Prescriptions  Medication Sig Dispense Refill  . budesonide-formoterol (SYMBICORT) 160-4.5 MCG/ACT inhaler Inhale 1 puff into the lungs 2 (two) times daily.     . calcium carbonate (TUMS - DOSED IN MG ELEMENTAL CALCIUM) 500 MG chewable tablet Chew 1 tablet by mouth daily.    . cholestyramine (QUESTRAN) 4 G packet Take 1 packet (4 g total) by mouth 3 (three) times daily. 60 each 3  . cyclobenzaprine (FLEXERIL) 5 MG tablet Take 1 tablet (5 mg total) by mouth 2 (two) times daily as needed for muscle spasms. 60 tablet 1  . dexamethasone (DECADRON) 4 MG tablet Take 2 tablets (8 mg total) by mouth 2 (two) times daily. Start the [name] before Taxotere. Then  again the [name] after chemo for 3 days. 30 tablet 1  . fluticasone (FLONASE) 50 MCG/ACT nasal spray Place 2 sprays into both nostrils daily. 16 g 2  . gabapentin (NEURONTIN) 600 MG tablet Take 600 mg by mouth at bedtime as needed (anxiety).     Marland Kitchen ibuprofen (ADVIL,MOTRIN) 800 MG tablet Take 800 mg by mouth every 8 (eight) hours as needed (pain).    Marland Kitchen ipratropium (ATROVENT HFA) 17 MCG/ACT inhaler Inhale 1 puff into the lungs every 6 (six) hours as needed for wheezing (SOB).     Marland Kitchen lidocaine-prilocaine (EMLA) cream Apply 1 application topically as needed. Apply over port site 1-2 hours before chemotherapy, cover with plastic wrap 30 g 0  . loratadine (CLARITIN) 10 MG tablet Take 1 tablet (10 mg total) by mouth as needed. 90 tablet 1  . LORazepam (ATIVAN) 0.5 MG tablet Take 1 tablet (0.5 mg total) by mouth at bedtime as needed (Nausea or vomiting). 30 tablet 0  . omeprazole (PRILOSEC) 40 MG capsule Take 1 capsule (40 mg total) by mouth daily. 30 capsule 2  . ondansetron (ZOFRAN) 8 MG  tablet Take 1 tablet (8 mg total) by mouth 2 (two) times daily. Start the [name] after chemo for 3 days. Then take as needed for nausea or vomiting. 30 tablet 1  . prochlorperazine (COMPAZINE) 10 MG tablet Take 1 tablet (10 mg total) by mouth every 6 (six) hours as needed (Nausea or vomiting). 30 tablet 1  . tobramycin-dexamethasone (TOBRADEX) ophthalmic solution Place 1 drop into both eyes 2 (two) times daily. 5 mL 0  . traZODone (DESYREL) 50 MG tablet Take 1 tablet (50 mg total) by mouth at bedtime as needed for sleep. 30 tablet 3  . fluconazole (DIFLUCAN) 100 MG tablet Take 1 tablet (100 mg total) by mouth daily. (Patient not taking: Reported on 03/18/2014) 7 tablet 0  . HYDROcodone-acetaminophen (NORCO/VICODIN) 5-325 MG per tablet Take 1-2 tablets by mouth every 6 (six) hours as needed for moderate pain. (Patient not taking: Reported on 03/18/2014) 30 tablet 0  . loperamide (IMODIUM) 2 MG capsule TAKE ONE TABLET AFTER EACH LOOSE STOOL. MAXIMUM OF SIX TABLETS A [name]. CHEMOTHERAPY INDUCED DIARRHEA. (Patient not taking: Reported on 03/18/2014) 60 capsule 3  . nystatin (MYCOSTATIN) 100000 UNIT/ML suspension Take 5 mLs (500,000 Units total) by mouth 4 (four) times daily as needed (Swish and spit). (Patient not taking: Reported on 03/18/2014) 60 mL 0   No current facility-administered medications for this visit.    OBJECTIVE: middle-aged white woman in no acute distress Filed Vitals:   03/18/14 1134  BP: 134/75  Pulse: 85  Temp: 98 F (36.7 C)  Resp: 18     Body mass index is 33.56 kg/(m^2).    ECOG FS:1 - Symptomatic but completely ambulatory  Skin: warm, dry  HEENT: sclerae anicteric, conjunctivae pink, oropharynx clear. No thrush or mucositis.  Lymph Nodes: No cervical or supraclavicular lymphadenopathy  Lungs: clear to auscultation bilaterally, no rales, wheezes, or rhonci  Heart: regular rate and rhythm  Abdomen: round, soft, non tender, positive bowel sounds  Musculoskeletal: No focal spinal  tenderness, no peripheral edema  Neuro: non focal, well oriented, positive affect  Breasts: deferred  LAB RESULTS:  CMP     Component Value Date/Time   NA 140 03/18/2014 1106   NA 139 12/31/2013 1448   K 3.3* 03/18/2014 1106   K 4.6 12/31/2013 1448   CL 103 12/31/2013 1448   CO2 23 03/18/2014 1106   CO2 24  12/31/2013 1448   GLUCOSE 124 03/18/2014 1106   GLUCOSE 93 12/31/2013 1448   BUN 9.9 03/18/2014 1106   BUN 9 12/31/2013 1448   CREATININE 0.9 03/18/2014 1106   CREATININE 0.83 12/31/2013 1448   CALCIUM 9.4 03/18/2014 1106   CALCIUM 8.8 12/31/2013 1448   PROT 6.4 03/18/2014 1106   PROT 7.1 06/04/2013 1511   ALBUMIN 3.7 03/18/2014 1106   ALBUMIN 3.4* 06/04/2013 1511   AST 20 03/18/2014 1106   AST 12 06/04/2013 1511   ALT 29 03/18/2014 1106   ALT 14 06/04/2013 1511   ALKPHOS 75 03/18/2014 1106   ALKPHOS 70 06/04/2013 1511   BILITOT 0.52 03/18/2014 1106   BILITOT 0.5 06/04/2013 1511   GFRNONAA 83* 12/31/2013 1448   GFRAA >90 12/31/2013 1448    I No results found for: SPEP  Lab Results  Component Value Date   WBC 5.4 03/18/2014   NEUTROABS 3.9 03/18/2014   HGB 11.8 03/18/2014   HCT 35.1 03/18/2014   MCV 99.4 03/18/2014   PLT 115* 03/18/2014      Chemistry      Component Value Date/Time   NA 140 03/18/2014 1106   NA 139 12/31/2013 1448   K 3.3* 03/18/2014 1106   K 4.6 12/31/2013 1448   CL 103 12/31/2013 1448   CO2 23 03/18/2014 1106   CO2 24 12/31/2013 1448   BUN 9.9 03/18/2014 1106   BUN 9 12/31/2013 1448   CREATININE 0.9 03/18/2014 1106   CREATININE 0.83 12/31/2013 1448      Component Value Date/Time   CALCIUM 9.4 03/18/2014 1106   CALCIUM 8.8 12/31/2013 1448   ALKPHOS 75 03/18/2014 1106   ALKPHOS 70 06/04/2013 1511   AST 20 03/18/2014 1106   AST 12 06/04/2013 1511   ALT 29 03/18/2014 1106   ALT 14 06/04/2013 1511   BILITOT 0.52 03/18/2014 1106   BILITOT 0.5 06/04/2013 1511       No results found for: LABCA2  No components found for:  [id number]  No results for input(s): INR in the last 168 hours.  Urinalysis    Component Value Date/Time   COLORURINE YELLOW 06/04/2013 1420   APPEARANCEUR HAZY* 06/04/2013 1420   LABSPEC 1.030 01/27/2014 1350   LABSPEC >1.030* 06/04/2013 1420   PHURINE 6.0 06/04/2013 1420   GLUCOSEU Negative 01/27/2014 1350   GLUCOSEU NEGATIVE 06/04/2013 1420   HGBUR MODERATE* 06/04/2013 1420   HGBUR negative 10/07/2009 1044   BILIRUBINUR NEGATIVE 06/04/2013 1420   KETONESUR NEGATIVE 06/04/2013 1420   PROTEINUR NEGATIVE 06/04/2013 1420   UROBILINOGEN Color Interference 01/27/2014 1350   UROBILINOGEN 0.2 06/04/2013 1420   NITRITE NEGATIVE 06/04/2013 1420   LEUKOCYTESUR NEGATIVE 06/04/2013 1420    STUDIES: No results found.  ASSESSMENT: 48 y.o. BRCA negative [place] woman status post left breast upper outer quadrant biopsy 11/29/2013 for a clinical T2 N0, stage IIA invasive ductal carcinoma, grade 1, triple positive, with an MIB-1 of 46%.  (1) MRI-guided biopsy of two additional areas in the left breast showed only tubular adenomas, no malignancy.   (2) neoadjuvant chemotherapy to start 01/14/2014, consisting of carboplatin, docetaxel, trastuzumab and pertuzumab given every 21 days for 6 cycles, with Neulasta on [name] 2-- echocardiogram 12/19/2013 showed an ejection fraction in the 50-55% range.  (3) trastuzumab to be continued to the total one year, through September of 2016  (4) definitive surgery to follow chemotherapy  (5) radiation to follow surgery  (6) antiestrogen therapy to follow radiation  PLAN: The labs were  reviewed in detail and were stable. We will proceed with cycle 4 of carboplatin, docetaxel, trastuzumab, and pertuzumab. She receive neulasta tomorrow.   Dr. Jana Hakim ordered a plain film X-ray to her lumbar spine and she will have this done after chemo is over. I suggested she follow up with her gynecologist about her left lower abdominal pain. Her ovary was not palpable or  particularly tender on exam.   Namine is scheduled for a midpoint breast MRI on 12/15. She will return next week for labs and her nadir visit. She understands and agrees with this plan. She knows the goal of treatment is cure. She has been encouraged to call with any issues that might arise before her next visit here.    Jessica Duster, NP   03/18/2014 1:07 PM

## 2014-03-18 NOTE — Patient Instructions (Signed)
[facility] Discharge Instructions for Patients Receiving Chemotherapy  Today you received the following chemotherapy agents Herceptin, Perjeta, Taxotere, and Carboplatin.  To help prevent nausea and vomiting after your treatment, we encourage you to take your nausea medication Compazine 10 mg every 6 hours or Zofran 8 mg every 8 hours as needed.  If you develop nausea and vomiting that is not controlled by your nausea medication, call the clinic.   BELOW ARE SYMPTOMS THAT SHOULD BE REPORTED IMMEDIATELY:  *FEVER GREATER THAN 100.5 F  *CHILLS WITH OR WITHOUT FEVER  NAUSEA AND VOMITING THAT IS NOT CONTROLLED WITH YOUR NAUSEA MEDICATION  *UNUSUAL SHORTNESS OF BREATH  *UNUSUAL BRUISING OR BLEEDING  TENDERNESS IN MOUTH AND THROAT WITH OR WITHOUT PRESENCE OF ULCERS  *URINARY PROBLEMS  *BOWEL PROBLEMS  UNUSUAL RASH Items with * indicate a potential emergency and should be followed up as soon as possible.  Feel free to call the clinic you have any questions or concerns. The clinic phone number is [phone].

## 2014-03-18 NOTE — Patient Instructions (Signed)

## 2014-03-19 ENCOUNTER — Other Ambulatory Visit: Payer: Self-pay | Admitting: Nurse Practitioner

## 2014-03-19 ENCOUNTER — Ambulatory Visit (HOSPITAL_COMMUNITY)
Admission: RE | Admit: 2014-03-19 | Discharge: 2014-03-19 | Disposition: A | Discharge status: Home / Self Care | Payer: Medicaid Other | Source: Ambulatory Visit | Attending: Oncology | Admitting: Oncology

## 2014-03-19 ENCOUNTER — Ambulatory Visit (HOSPITAL_BASED_OUTPATIENT_CLINIC_OR_DEPARTMENT_OTHER): Payer: Medicaid Other

## 2014-03-19 ENCOUNTER — Telehealth: Payer: Self-pay | Admitting: Nurse Practitioner

## 2014-03-19 ENCOUNTER — Other Ambulatory Visit: Payer: Medicaid Other

## 2014-03-19 ENCOUNTER — Ambulatory Visit: Payer: Medicaid Other | Admitting: Nurse Practitioner

## 2014-03-19 DIAGNOSIS — M5137 Other intervertebral disc degeneration, lumbosacral region: Secondary | ICD-10-CM | POA: Insufficient documentation

## 2014-03-19 DIAGNOSIS — Z853 Personal history of malignant neoplasm of breast: Secondary | ICD-10-CM | POA: Diagnosis not present

## 2014-03-19 DIAGNOSIS — Z5189 Encounter for other specified aftercare: Secondary | ICD-10-CM

## 2014-03-19 DIAGNOSIS — M545 Low back pain: Secondary | ICD-10-CM | POA: Diagnosis present

## 2014-03-19 DIAGNOSIS — M62830 Muscle spasm of back: Secondary | ICD-10-CM

## 2014-03-19 DIAGNOSIS — C50412 Malignant neoplasm of upper-outer quadrant of left female breast: Secondary | ICD-10-CM

## 2014-03-19 MED ORDER — PEGFILGRASTIM INJECTION 6 MG/0.6ML ~~LOC~~
6.0000 mg | PREFILLED_SYRINGE | Freq: Once | SUBCUTANEOUS | Status: AC
  Administered 2014-03-19: 6 mg via SUBCUTANEOUS
  Filled 2014-03-19: qty 0.6

## 2014-03-19 MED ORDER — GABAPENTIN 300 MG PO CAPS: 300.0000 mg | ORAL_CAPSULE | Freq: Three times a day (TID) | ORAL | Status: DC

## 2014-03-19 NOTE — Telephone Encounter (Signed)
per pof to sch pt appt-sent MW email to sch pt appt-pt has sch and will get updated one on 12/8

## 2014-03-19 NOTE — Patient Instructions (Signed)
Pegfilgrastim injection What is this medicine? PEGFILGRASTIM (peg fil GRA stim) is a long-acting granulocyte colony-stimulating factor that stimulates the growth of neutrophils, a type of white blood cell important in the body's fight against infection. It is used to reduce the incidence of fever and infection in patients with certain types of cancer who are receiving chemotherapy that affects the bone marrow. This medicine may be used for other purposes; ask your health care provider or pharmacist if you have questions. COMMON BRAND NAME(S): Neulasta What should I tell my health care provider before I take this medicine? They need to know if you have any of these conditions: -latex allergy -ongoing radiation therapy -sickle cell disease -skin reactions to acrylic adhesives (On-Body Injector only) -an unusual or allergic reaction to pegfilgrastim, filgrastim, other medicines, foods, dyes, or preservatives -pregnant or trying to get pregnant -breast-feeding How should I use this medicine? This medicine is for injection under the skin. If you get this medicine at home, you will be taught how to prepare and give the pre-filled syringe or how to use the On-body Injector. Refer to the patient Instructions for Use for detailed instructions. Use exactly as directed. Take your medicine at regular intervals. Do not take your medicine more often than directed. It is important that you put your used needles and syringes in a special sharps container. Do not put them in a trash can. If you do not have a sharps container, call your pharmacist or healthcare provider to get one. Talk to your pediatrician regarding the use of this medicine in children. Special care may be needed. Overdosage: If you think you have taken too much of this medicine contact a poison control center or emergency room at once. NOTE: This medicine is only for you. Do not share this medicine with others. What if I miss a dose? It is  important not to miss your dose. Call your doctor or health care professional if you miss your dose. If you miss a dose due to an On-body Injector failure or leakage, a new dose should be administered as soon as possible using a single prefilled syringe for manual use. What may interact with this medicine? Interactions have not been studied. Give your health care provider a list of all the medicines, herbs, non-prescription drugs, or dietary supplements you use. Also tell them if you smoke, drink alcohol, or use illegal drugs. Some items may interact with your medicine. This list may not describe all possible interactions. Give your health care provider a list of all the medicines, herbs, non-prescription drugs, or dietary supplements you use. Also tell them if you smoke, drink alcohol, or use illegal drugs. Some items may interact with your medicine. What should I watch for while using this medicine? You may need blood work done while you are taking this medicine. If you are going to need a MRI, CT scan, or other procedure, tell your doctor that you are using this medicine (On-Body Injector only). What side effects may I notice from receiving this medicine? Side effects that you should report to your doctor or health care professional as soon as possible: -allergic reactions like skin rash, itching or hives, swelling of the face, lips, or tongue -dizziness -fever -pain, redness, or irritation at site where injected -pinpoint red spots on the skin -shortness of breath or breathing problems -stomach or side pain, or pain at the shoulder -swelling -tiredness -trouble passing urine Side effects that usually do not require medical attention (report to your doctor   or health care professional if they continue or are bothersome): -bone pain -muscle pain This list may not describe all possible side effects. Call your doctor for medical advice about side effects. You may report side effects to FDA at  1-800-FDA-1088. Where should I keep my medicine? Keep out of the reach of children. Store pre-filled syringes in a refrigerator between 2 and 8 degrees C (36 and 46 degrees F). Do not freeze. Keep in carton to protect from light. Throw away this medicine if it is left out of the refrigerator for more than 48 hours. Throw away any unused medicine after the expiration date. NOTE: This sheet is a summary. It may not cover all possible information. If you have questions about this medicine, talk to your doctor, pharmacist, or health care provider.  2015, Elsevier/Gold Standard. (2013-07-04 16:14:05)  

## 2014-03-20 ENCOUNTER — Other Ambulatory Visit: Payer: Self-pay | Admitting: Nurse Practitioner

## 2014-03-20 ENCOUNTER — Telehealth: Payer: Self-pay | Admitting: *Deleted

## 2014-03-20 NOTE — Telephone Encounter (Signed)
Per staff message and POF I have scheduled appts. Advised scheduler of appts. JMW  

## 2014-03-21 ENCOUNTER — Other Ambulatory Visit: Payer: Self-pay | Admitting: *Deleted

## 2014-03-21 DIAGNOSIS — C50412 Malignant neoplasm of upper-outer quadrant of left female breast: Secondary | ICD-10-CM

## 2014-03-21 MED ORDER — LORAZEPAM 0.5 MG PO TABS: 0.5000 mg | ORAL_TABLET | Freq: Every evening | ORAL | Status: DC | PRN

## 2014-03-25 ENCOUNTER — Other Ambulatory Visit (HOSPITAL_BASED_OUTPATIENT_CLINIC_OR_DEPARTMENT_OTHER): Payer: Medicaid Other

## 2014-03-25 ENCOUNTER — Telehealth: Payer: Self-pay | Admitting: Oncology

## 2014-03-25 ENCOUNTER — Encounter: Payer: Self-pay | Admitting: Nurse Practitioner

## 2014-03-25 ENCOUNTER — Ambulatory Visit (HOSPITAL_BASED_OUTPATIENT_CLINIC_OR_DEPARTMENT_OTHER): Payer: Medicaid Other | Admitting: Nurse Practitioner

## 2014-03-25 VITALS — BP 122/81 | HR 97 | Temp 98.5°F | Resp 18 | Ht 65.0 in | Wt 198.4 lb

## 2014-03-25 DIAGNOSIS — B370 Candidal stomatitis: Secondary | ICD-10-CM

## 2014-03-25 DIAGNOSIS — Z170 Estrogen receptor positive status [ER+]: Secondary | ICD-10-CM

## 2014-03-25 DIAGNOSIS — K5900 Constipation, unspecified: Secondary | ICD-10-CM

## 2014-03-25 DIAGNOSIS — C50412 Malignant neoplasm of upper-outer quadrant of left female breast: Secondary | ICD-10-CM

## 2014-03-25 DIAGNOSIS — M545 Low back pain, unspecified: Secondary | ICD-10-CM

## 2014-03-25 LAB — CBC WITH DIFFERENTIAL/PLATELET
BASO%: 0.2 % (ref 0.0–2.0)
Basophils Absolute: 0 10*3/uL (ref 0.0–0.1)
EOS%: 0.2 % (ref 0.0–7.0)
Eosinophils Absolute: 0 10*3/uL (ref 0.0–0.5)
HCT: 37.7 % (ref 34.8–46.6)
HGB: 12.9 g/dL (ref 11.6–15.9)
LYMPH%: 15.7 % (ref 14.0–49.7)
MCH: 33.7 pg (ref 25.1–34.0)
MCHC: 34.2 g/dL (ref 31.5–36.0)
MCV: 98.4 fL (ref 79.5–101.0)
MONO#: 0.6 10*3/uL (ref 0.1–0.9)
MONO%: 6.5 % (ref 0.0–14.0)
NEUT#: 6.9 10*3/uL — ABNORMAL HIGH (ref 1.5–6.5)
NEUT%: 77.4 % — ABNORMAL HIGH (ref 38.4–76.8)
Platelets: 141 10*3/uL — ABNORMAL LOW (ref 145–400)
RBC: 3.83 10*6/uL (ref 3.70–5.45)
RDW: 16.3 % — AB (ref 11.2–14.5)
WBC: 8.9 10*3/uL (ref 3.9–10.3)
lymph#: 1.4 10*3/uL (ref 0.9–3.3)

## 2014-03-25 LAB — COMPREHENSIVE METABOLIC PANEL (CC13)
ALBUMIN: 4.3 g/dL (ref 3.5–5.0)
ALK PHOS: 97 U/L (ref 40–150)
ALT: 25 U/L (ref 0–55)
AST: 14 U/L (ref 5–34)
Anion Gap: 12 mEq/L — ABNORMAL HIGH (ref 3–11)
BILIRUBIN TOTAL: 0.65 mg/dL (ref 0.20–1.20)
BUN: 13.7 mg/dL (ref 7.0–26.0)
CO2: 26 mEq/L (ref 22–29)
Calcium: 10.5 mg/dL — ABNORMAL HIGH (ref 8.4–10.4)
Chloride: 101 mEq/L (ref 98–109)
Creatinine: 0.9 mg/dL (ref 0.6–1.1)
EGFR: 74 mL/min/{1.73_m2} — ABNORMAL LOW (ref >90)
Glucose: 125 mg/dl (ref 70–140)
POTASSIUM: 3.6 meq/L (ref 3.5–5.1)
Sodium: 139 mEq/L (ref 136–145)
TOTAL PROTEIN: 7 g/dL (ref 6.4–8.3)

## 2014-03-25 MED ORDER — FLUCONAZOLE 100 MG PO TABS: 100.0000 mg | ORAL_TABLET | Freq: Every day | ORAL | Status: DC

## 2014-03-25 NOTE — Addendum Note (Signed)
Addended by: Marcelino Duster on: 03/25/2014 02:30 PM   Modules accepted: Orders

## 2014-03-25 NOTE — Telephone Encounter (Signed)
per pof to sch pt appt-cld [facility]-per agent practice not taking new Mcaid pt-will check w/MD and call us back to see if he will agree to see pt

## 2014-03-25 NOTE — Telephone Encounter (Signed)
sent HF email to adv of what Hazel Sams response was

## 2014-03-25 NOTE — Progress Notes (Signed)
Jessica [name]  [phone]) [phone] Fax:([phone]) [phone]     ID: Jessica [name] DOB: 26-Feb-1966  MR#: [id number]  CMK#:[id number]  Patient Care Team: [name], MD as PCP - General (Nurse Practitioner) [name], MD as Consulting Physician (General Surgery) [name], MD as Consulting Physician (Oncology) [name], MD as Consulting Physician (Radiation Oncology)  CHIEF COMPLAINT: Newly diagnosed triple positive breast cancer CURRENT TREATMENT: neoadjuvant chemo-immunotherapy  BREAST CANCER HISTORY: From the original intake note:  Jessica [name] herself palpated a mass in her left breast. She brought it to the attention of [name] at the health department and she was set up for unilateral left mammography and ultrasound 11/27/2013 at the [facility]. This showed a possible distortion in the left breast upper outer quadrant. However the patient's breasts are density category D. On exam there was a firm nodule or mass in the left breast 2:00 location which by ultrasound was irregular and hypoechoic and measured 1.5 cm. There was no left axillary lymphadenopathy noted.  Biopsy of the mass in question 11/29/2013 showed ([id number]) an invasive ductal carcinoma, grade 1, estrogen receptor 94% positive, progesterone receptor 94% positive, both with strong staining intensity, with an MIB-1 of 46%, and HER-2 amplified by CISH, the signals ratio of 4.85 and the number per cell 6.55.  : 12/08/2013 the patient underwent bilateral breast MRI. This showed in the left breast upper outer quadrant a 2.9 cm irregular enhancing mass and extending posteriorly from this an area of clumped non-masslike enhancement extending approximately 6 cm and leading to a posterior group of nodules which were small but measured in aggregate 2.2 cm. In addition, there was a separate 0.8 cm irregular spiculated enhancing mass in the upper inner aspect of the left breast. In the  lower outer left breast there was a 0.8 cm oval enhancing mass. There were no abnormal appearing lymph nodes.  [name] subsequent history is as detailed below  INTERVAL HISTORY: Lucindy returns today for follow up of her breast cancer, accompanied by her husband, Jessica [name]. Today is day 8, cycle 4 of 6 planned cycles of carboplatin, docetaxel, pertuzumab, and trastuzumab to be given every 3 weeks.   Ruthella had an xray on 03/19/14 that showed mild degenerative disc changes at L5-S1 as well as facet joint hypertrophy. It is possible the neulasta on day 2 causes this pain to be worse. [name] spends a lot of time laying down or on the couch which only causes stiffness but her fatigue this past week has been the worse of all the cycles. She had her first bowel movement in 6 days today and has been taking miralax, but was confused and also took her questran powder daily not realizing this was worsening the situation. Her appetite has decreased secondary to taste changes. She has a thin film on her tongue that is not improved with nystatin mouth wash. She denies fevers, chills, nausea, or vomiting. She has no peripheral neuropathy symptoms.   REVIEW OF SYSTEMS: A detailed review of systems is otherwise noncontributory, except where noted above.    PAST MEDICAL HISTORY: Past Medical History  Diagnosis Date  . Anxiety   . COPD (chronic obstructive pulmonary disease) 2011    does not use inhaler ofter  . Depression   . Hot flashes   . Family history of anesthesia complication     Had a hard time waking father up only once after several procedures  . Shortness of breath  with exertion  . Heart murmur     PMH: at age 48; no longer have murmur  . Chronic heartburn   . PVC (premature ventricular contraction)     PMH;at 48 y.o.  . Cancer     DCIS  . Asthma     denies history of asthma    PAST SURGICAL HISTORY: Past Surgical History  Procedure Laterality Date  . Polyp removal     .  Tonsillectomy    . Wisdom tooth extraction    . Robotic assisted total hysterectomy with bilateral salpingo oopherectomy Bilateral 05/24/2013    Procedure: ROBOTIC ASSISTED TOTAL HYSTERECTOMY WITH BILATERAL SALPINGECTOMY;  Surgeon: Lavonia Drafts, MD;  Location: [facility];  Service: Gynecology;  Laterality: Bilateral;  . Cystoscopy N/A 05/24/2013    Procedure: CYSTOSCOPY;  Surgeon: Lavonia Drafts, MD;  Location: [facility];  Service: Gynecology;  Laterality: N/A;  . Abdominal hysterectomy    . Breast surgery      left breast biopsy x 2  . Portacath placement Right 01/02/2014    Procedure: INSERTION PORT-A-CATH;  Surgeon: [name], MD;  Location: [facility];  Service: General;  Laterality: Right;    FAMILY HISTORY Family History  Problem Relation Age of Onset  . Heart disease Mother   . Hypercholesterolemia Mother   . Heart disease Father   . COPD Father   . Hypercholesterolemia Father   . Cancer Neg Hx   [name] parents are still living, her father is 47 years old and her mother 69 years old as of August 2015. The patient had no brothers, one sister. There is no history of breast or ovarian cancer in the family.  GYNECOLOGIC HISTORY:  Patient's last menstrual period was 03/18/2013. Menarche age 4. She is GX P0. She took oral contraceptives for many years as well as Depo Provera shots. She is status post hysterectomy with bilateral salpingectomy. 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 She scans at work, and she also does all this she'll vein and stocking. She is single but for the last 11 years as lives with her significant other [name]. He is disabled secondary to multiple orthopedic problems including bilateral avascular necrosis of the hips. Both of them do smoke. The patient is here for drinks on weekends    ADVANCED DIRECTIVES: Not in place    HEALTH MAINTENANCE: History  Substance Use Topics  . Smoking  status: Current Every Day Smoker -- 1.00 packs/day for 16 years    Types: Cigarettes  . Smokeless tobacco: Never Used  . Alcohol Use: Yes     Comment: occasional      Colonoscopy:  PAP: January 2015   Bone density:  Lipid panel:  No Known Allergies  Current Outpatient Prescriptions  Medication Sig Dispense Refill  . budesonide-formoterol (SYMBICORT) 160-4.5 MCG/ACT inhaler Inhale 1 puff into the lungs 2 (two) times daily.     . calcium carbonate (TUMS - DOSED IN MG ELEMENTAL CALCIUM) 500 MG chewable tablet Chew 1 tablet by mouth daily.    . cyclobenzaprine (FLEXERIL) 5 MG tablet Take 1 tablet (5 mg total) by mouth 2 (two) times daily as needed for muscle spasms. 60 tablet 1  . fluticasone (FLONASE) 50 MCG/ACT nasal spray Place 2 sprays into both nostrils daily. 16 g 2  . gabapentin (NEURONTIN) 600 MG tablet Take 600 mg by mouth at bedtime as needed (anxiety).     Marland Kitchen HYDROcodone-acetaminophen (NORCO/VICODIN) 5-325  MG per tablet Take 1-2 tablets by mouth every 6 (six) hours as needed for moderate pain. 30 tablet 0  . ibuprofen (ADVIL,MOTRIN) 800 MG tablet Take 800 mg by mouth every 8 (eight) hours as needed (pain).    Marland Kitchen loratadine (CLARITIN) 10 MG tablet Take 1 tablet (10 mg total) by mouth as needed. 90 tablet 1  . LORazepam (ATIVAN) 0.5 MG tablet Take 1 tablet (0.5 mg total) by mouth at bedtime as needed (Nausea or vomiting). 30 tablet 0  . omeprazole (PRILOSEC) 40 MG capsule Take 1 capsule (40 mg total) by mouth daily. 30 capsule 2  . ondansetron (ZOFRAN) 8 MG tablet Take 1 tablet (8 mg total) by mouth 2 (two) times daily. Start the day after chemo for 3 days. Then take as needed for nausea or vomiting. 30 tablet 1  . tobramycin-dexamethasone (TOBRADEX) ophthalmic solution Place 1 drop into both eyes 2 (two) times daily. 5 mL 0  . traZODone (DESYREL) 50 MG tablet Take 1 tablet (50 mg total) by mouth at bedtime as needed for sleep. 30 tablet 3  . cholestyramine (QUESTRAN) 4 G packet Take  1 packet (4 g total) by mouth 3 (three) times daily. (Patient not taking: Reported on 03/25/2014) 60 each 3  . dexamethasone (DECADRON) 4 MG tablet Take 2 tablets (8 mg total) by mouth 2 (two) times daily. Start the day before Taxotere. Then again the day after chemo for 3 days. (Patient not taking: Reported on 03/25/2014) 30 tablet 1  . fluconazole (DIFLUCAN) 100 MG tablet Take 1 tablet (100 mg total) by mouth daily. 10 tablet 0  . gabapentin (NEURONTIN) 300 MG capsule Take 1 capsule (300 mg total) by mouth 3 (three) times daily. (Patient not taking: Reported on 03/25/2014) 90 capsule 0  . ipratropium (ATROVENT HFA) 17 MCG/ACT inhaler Inhale 1 puff into the lungs every 6 (six) hours as needed for wheezing (SOB).     Marland Kitchen lidocaine-prilocaine (EMLA) cream Apply 1 application topically as needed. Apply over port site 1-2 hours before chemotherapy, cover with plastic wrap (Patient not taking: Reported on 03/25/2014) 30 g 0  . loperamide (IMODIUM) 2 MG capsule TAKE ONE TABLET AFTER EACH LOOSE STOOL. MAXIMUM OF SIX TABLETS A DAY. CHEMOTHERAPY INDUCED DIARRHEA. (Patient not taking: Reported on 03/18/2014) 60 capsule 3  . nystatin (MYCOSTATIN) 100000 UNIT/ML suspension Take 5 mLs (500,000 Units total) by mouth 4 (four) times daily as needed (Swish and spit). (Patient not taking: Reported on 03/18/2014) 60 mL 0  . prochlorperazine (COMPAZINE) 10 MG tablet Take 1 tablet (10 mg total) by mouth every 6 (six) hours as needed (Nausea or vomiting). (Patient not taking: Reported on 03/25/2014) 30 tablet 1   No current facility-administered medications for this visit.    OBJECTIVE: middle-aged white woman in no acute distress Filed Vitals:   03/25/14 0857  BP: 122/81  Pulse: 97  Temp: 98.5 F (36.9 C)  Resp: 18     Body mass index is 33.02 kg/(m^2).    ECOG FS:1 - Symptomatic but completely ambulatory  Sclerae unicteric, pupils equal and reactive Oropharynx moist, thin coating over tongue No cervical or  supraclavicular adenopathy Lungs no rales or rhonchi Heart regular rate and rhythm Abd soft, nontender, positive bowel sounds MSK no focal spinal tenderness, no upper extremity lymphedema Neuro: nonfocal, well oriented, appropriate affect Breasts: right breast unremarkable. Previously easily palpable mass in left breast unable to be located today. No skin or nipple changes. Left axilla bening.    LAB RESULTS:  CMP     Component Value Date/Time   NA 139 03/25/2014 0847   NA 139 12/31/2013 1448   K 3.6 03/25/2014 0847   K 4.6 12/31/2013 1448   CL 103 12/31/2013 1448   CO2 26 03/25/2014 0847   CO2 24 12/31/2013 1448   GLUCOSE 125 03/25/2014 0847   GLUCOSE 93 12/31/2013 1448   BUN 13.7 03/25/2014 0847   BUN 9 12/31/2013 1448   CREATININE 0.9 03/25/2014 0847   CREATININE 0.83 12/31/2013 1448   CALCIUM 10.5* 03/25/2014 0847   CALCIUM 8.8 12/31/2013 1448   PROT 7.0 03/25/2014 0847   PROT 7.1 06/04/2013 1511   ALBUMIN 4.3 03/25/2014 0847   ALBUMIN 3.4* 06/04/2013 1511   AST 14 03/25/2014 0847   AST 12 06/04/2013 1511   ALT 25 03/25/2014 0847   ALT 14 06/04/2013 1511   ALKPHOS 97 03/25/2014 0847   ALKPHOS 70 06/04/2013 1511   BILITOT 0.65 03/25/2014 0847   BILITOT 0.5 06/04/2013 1511   GFRNONAA 83* 12/31/2013 1448   GFRAA >90 12/31/2013 1448    I No results found for: SPEP  Lab Results  Component Value Date   WBC 8.9 03/25/2014   NEUTROABS 6.9* 03/25/2014   HGB 12.9 03/25/2014   HCT 37.7 03/25/2014   MCV 98.4 03/25/2014   PLT 141* 03/25/2014      Chemistry      Component Value Date/Time   NA 139 03/25/2014 0847   NA 139 12/31/2013 1448   K 3.6 03/25/2014 0847   K 4.6 12/31/2013 1448   CL 103 12/31/2013 1448   CO2 26 03/25/2014 0847   CO2 24 12/31/2013 1448   BUN 13.7 03/25/2014 0847   BUN 9 12/31/2013 1448   CREATININE 0.9 03/25/2014 0847   CREATININE 0.83 12/31/2013 1448      Component Value Date/Time   CALCIUM 10.5* 03/25/2014 0847   CALCIUM 8.8  12/31/2013 1448   ALKPHOS 97 03/25/2014 0847   ALKPHOS 70 06/04/2013 1511   AST 14 03/25/2014 0847   AST 12 06/04/2013 1511   ALT 25 03/25/2014 0847   ALT 14 06/04/2013 1511   BILITOT 0.65 03/25/2014 0847   BILITOT 0.5 06/04/2013 1511       No results found for: LABCA2  No components found for: [id number]  No results for input(s): INR in the last 168 hours.  Urinalysis    Component Value Date/Time   COLORURINE YELLOW 06/04/2013 1420   APPEARANCEUR HAZY* 06/04/2013 1420   LABSPEC 1.030 01/27/2014 1350   LABSPEC >1.030* 06/04/2013 1420   PHURINE 6.0 06/04/2013 1420   GLUCOSEU Negative 01/27/2014 1350   GLUCOSEU NEGATIVE 06/04/2013 1420   HGBUR MODERATE* 06/04/2013 1420   HGBUR negative 10/07/2009 1044   BILIRUBINUR NEGATIVE 06/04/2013 1420   KETONESUR NEGATIVE 06/04/2013 1420   PROTEINUR NEGATIVE 06/04/2013 1420   UROBILINOGEN Color Interference 01/27/2014 1350   UROBILINOGEN 0.2 06/04/2013 1420   NITRITE NEGATIVE 06/04/2013 1420   LEUKOCYTESUR NEGATIVE 06/04/2013 1420    STUDIES: Dg Lumbar Spine Complete  03/19/2014   CLINICAL DATA:  Mid to low back pain for 3 weeks without known trauma ; history of breast malignancy  EXAM: LUMBAR SPINE - COMPLETE 4+ VIEW  COMPARISON:  None.  FINDINGS: The lumbar vertebral bodies are preserved in height. There is mild disc space narrowing at L5-S1. There is no spondylolisthesis. There is mild facet joint hypertrophy at L4-5 and at L5-S1. The pedicles and transverse processes are intact. The observed portions of the sacrum are  normal.  IMPRESSION: There are mild degenerative disc changes at L5-S1 and there is facet joint hypertrophy at L4-5 and L5-S1 bilaterally. There are no findings suspicious for metastatic disease.   Electronically Signed   By: David  Martinique   On: 03/19/2014 13:08    ASSESSMENT: 48 y.o. BRCA negative [place] woman status post left breast upper outer quadrant biopsy 11/29/2013 for a clinical T2 N0, stage IIA invasive  ductal carcinoma, grade 1, triple positive, with an MIB-1 of 46%.  (1) MRI-guided biopsy of two additional areas in the left breast showed only tubular adenomas, no malignancy.   (2) neoadjuvant chemotherapy to start 01/14/2014, consisting of carboplatin, docetaxel, trastuzumab and pertuzumab given every 21 days for 6 cycles, with Neulasta on day 2-- echocardiogram 12/19/2013 showed an ejection fraction in the 50-55% range.  (3) trastuzumab to be continued to the total one year, through September of 2016  (4) definitive surgery to follow chemotherapy  (5) radiation to follow surgery  (6) antiestrogen therapy to follow radiation  PLAN: Aaniya is doing fair today. The labs were reviewed in detail and were entirely stable. I told her to stop the questran powder and fully explained the appropriate time for its use. She will continue to stay hydrated and use stool softeners with miralax until she has a soft formed bowel movement at least every other day. I have written for diflucan 175m daily x 5 to clear her thrush.   Manvi requested a neurologist to further work up her back pain. In the meantime I suggested she increase her water intake and utilize NSAIDs daily with the remaining hydrocodone tablets. she was also prescribed flexiril, but she does not find that to be effective.    LSenayawill return in 2 weeks for the start of cycle 5. She is scheduled for a midpoint breast MRI on 12/15. She will discuss the results with Dr. MJana Hakimat her next visit. She understands and agrees with this plan. She knows the goal of treatment is cure. She has been encouraged to call with any issues that might arise before her next visit here.    FMarcelino Duster NP   03/25/2014 10:21 AM

## 2014-03-31 ENCOUNTER — Other Ambulatory Visit: Payer: Self-pay | Admitting: Nurse Practitioner

## 2014-03-31 NOTE — Addendum Note (Signed)
Addended by: Marcelino Duster on: 03/31/2014 04:23 PM   Modules accepted: Orders

## 2014-04-01 ENCOUNTER — Ambulatory Visit (HOSPITAL_COMMUNITY): Admission: RE | Admit: 2014-04-01 | Payer: Medicaid Other | Source: Ambulatory Visit

## 2014-04-02 ENCOUNTER — Other Ambulatory Visit: Payer: Self-pay | Admitting: Nurse Practitioner

## 2014-04-02 DIAGNOSIS — Z853 Personal history of malignant neoplasm of breast: Secondary | ICD-10-CM

## 2014-04-08 ENCOUNTER — Other Ambulatory Visit: Payer: Self-pay | Admitting: Emergency Medicine

## 2014-04-08 ENCOUNTER — Ambulatory Visit (HOSPITAL_BASED_OUTPATIENT_CLINIC_OR_DEPARTMENT_OTHER): Payer: Medicaid Other | Admitting: Lab

## 2014-04-08 ENCOUNTER — Ambulatory Visit (HOSPITAL_BASED_OUTPATIENT_CLINIC_OR_DEPARTMENT_OTHER): Payer: Medicaid Other

## 2014-04-08 ENCOUNTER — Ambulatory Visit: Payer: Medicaid Other

## 2014-04-08 ENCOUNTER — Telehealth: Payer: Self-pay | Admitting: Oncology

## 2014-04-08 ENCOUNTER — Ambulatory Visit (HOSPITAL_BASED_OUTPATIENT_CLINIC_OR_DEPARTMENT_OTHER): Payer: Medicaid Other | Admitting: Oncology

## 2014-04-08 ENCOUNTER — Other Ambulatory Visit: Payer: Self-pay | Admitting: Hematology and Oncology

## 2014-04-08 VITALS — BP 137/79 | HR 108 | Temp 98.5°F | Resp 18 | Ht 65.0 in | Wt 207.4 lb

## 2014-04-08 DIAGNOSIS — Z170 Estrogen receptor positive status [ER+]: Secondary | ICD-10-CM

## 2014-04-08 DIAGNOSIS — C50412 Malignant neoplasm of upper-outer quadrant of left female breast: Secondary | ICD-10-CM

## 2014-04-08 DIAGNOSIS — Z5111 Encounter for antineoplastic chemotherapy: Secondary | ICD-10-CM

## 2014-04-08 DIAGNOSIS — Z95828 Presence of other vascular implants and grafts: Secondary | ICD-10-CM

## 2014-04-08 DIAGNOSIS — Z5112 Encounter for antineoplastic immunotherapy: Secondary | ICD-10-CM

## 2014-04-08 LAB — COMPREHENSIVE METABOLIC PANEL (CC13)
ALT: 21 U/L (ref 0–55)
AST: 14 U/L (ref 5–34)
Albumin: 3.7 g/dL (ref 3.5–5.0)
Alkaline Phosphatase: 70 U/L (ref 40–150)
Anion Gap: 9 mEq/L (ref 3–11)
BUN: 10.9 mg/dL (ref 7.0–26.0)
CO2: 26 mEq/L (ref 22–29)
CREATININE: 0.9 mg/dL (ref 0.6–1.1)
Calcium: 9.3 mg/dL (ref 8.4–10.4)
Chloride: 105 mEq/L (ref 98–109)
EGFR: 81 mL/min/{1.73_m2} — ABNORMAL LOW (ref >90)
Glucose: 142 mg/dl — ABNORMAL HIGH (ref 70–140)
Potassium: 4 mEq/L (ref 3.5–5.1)
Sodium: 140 mEq/L (ref 136–145)
Total Bilirubin: 0.6 mg/dL (ref 0.20–1.20)
Total Protein: 6.4 g/dL (ref 6.4–8.3)

## 2014-04-08 LAB — CBC WITH DIFFERENTIAL/PLATELET
BASO%: 0.1 % (ref 0.0–2.0)
Basophils Absolute: 0 10*3/uL (ref 0.0–0.1)
EOS%: 0 % (ref 0.0–7.0)
Eosinophils Absolute: 0 10*3/uL (ref 0.0–0.5)
HEMATOCRIT: 34.4 % — AB (ref 34.8–46.6)
HGB: 11.6 g/dL (ref 11.6–15.9)
LYMPH%: 5.3 % — ABNORMAL LOW (ref 14.0–49.7)
MCH: 34.5 pg — AB (ref 25.1–34.0)
MCHC: 33.7 g/dL (ref 31.5–36.0)
MCV: 102.4 fL — ABNORMAL HIGH (ref 79.5–101.0)
MONO#: 0.1 10*3/uL (ref 0.1–0.9)
MONO%: 0.6 % (ref 0.0–14.0)
NEUT#: 7.2 10*3/uL — ABNORMAL HIGH (ref 1.5–6.5)
NEUT%: 94 % — AB (ref 38.4–76.8)
Platelets: 109 10*3/uL — ABNORMAL LOW (ref 145–400)
RBC: 3.36 10*6/uL — ABNORMAL LOW (ref 3.70–5.45)
RDW: 19.1 % — AB (ref 11.2–14.5)
WBC: 7.7 10*3/uL (ref 3.9–10.3)
lymph#: 0.4 10*3/uL — ABNORMAL LOW (ref 0.9–3.3)

## 2014-04-08 MED ORDER — ACETAMINOPHEN 325 MG PO TABS
ORAL_TABLET | ORAL | Status: AC
  Filled 2014-04-08: qty 2

## 2014-04-08 MED ORDER — ACETAMINOPHEN 325 MG PO TABS
650.0000 mg | ORAL_TABLET | Freq: Once | ORAL | Status: AC
  Administered 2014-04-08: 650 mg via ORAL

## 2014-04-08 MED ORDER — DEXAMETHASONE SODIUM PHOSPHATE 20 MG/5ML IJ SOLN
20.0000 mg | Freq: Once | INTRAMUSCULAR | Status: AC
  Administered 2014-04-08: 20 mg via INTRAVENOUS

## 2014-04-08 MED ORDER — SODIUM CHLORIDE 0.9 % IV SOLN
420.0000 mg | Freq: Once | INTRAVENOUS | Status: AC
  Administered 2014-04-08: 420 mg via INTRAVENOUS
  Filled 2014-04-08: qty 14

## 2014-04-08 MED ORDER — HEPARIN SOD (PORK) LOCK FLUSH 100 UNIT/ML IV SOLN
500.0000 [IU] | Freq: Once | INTRAVENOUS | Status: DC | PRN
  Filled 2014-04-08: qty 5

## 2014-04-08 MED ORDER — DEXAMETHASONE SODIUM PHOSPHATE 20 MG/5ML IJ SOLN
INTRAMUSCULAR | Status: AC
  Filled 2014-04-08: qty 5

## 2014-04-08 MED ORDER — DIPHENHYDRAMINE HCL 25 MG PO CAPS
ORAL_CAPSULE | ORAL | Status: AC
Start: 2014-04-08 — End: 2014-04-08
  Filled 2014-04-08: qty 1

## 2014-04-08 MED ORDER — ONDANSETRON 16 MG/50ML IVPB (CHCC)
INTRAVENOUS | Status: AC
  Filled 2014-04-08: qty 16

## 2014-04-08 MED ORDER — HYDROCODONE-IBUPROFEN 5-200 MG PO TABS: 2.0000 | ORAL_TABLET | Freq: Three times a day (TID) | ORAL | Status: DC | PRN

## 2014-04-08 MED ORDER — DOCETAXEL CHEMO INJECTION 160 MG/16ML
75.0000 mg/m2 | Freq: Once | INTRAVENOUS | Status: AC
  Administered 2014-04-08: 150 mg via INTRAVENOUS
  Filled 2014-04-08: qty 15

## 2014-04-08 MED ORDER — SODIUM CHLORIDE 0.9 % IV SOLN
720.0000 mg | Freq: Once | INTRAVENOUS | Status: AC
  Administered 2014-04-08: 720 mg via INTRAVENOUS
  Filled 2014-04-08: qty 72

## 2014-04-08 MED ORDER — HYDROCODONE-ACETAMINOPHEN 5-325 MG PO TABS: 1.0000 | ORAL_TABLET | Freq: Four times a day (QID) | ORAL | Status: DC | PRN

## 2014-04-08 MED ORDER — DIPHENHYDRAMINE HCL 25 MG PO CAPS
25.0000 mg | ORAL_CAPSULE | Freq: Once | ORAL | Status: AC
  Administered 2014-04-08: 25 mg via ORAL

## 2014-04-08 MED ORDER — SODIUM CHLORIDE 0.9 % IJ SOLN
10.0000 mL | INTRAMUSCULAR | Status: DC | PRN
  Administered 2014-04-08: 10 mL
  Filled 2014-04-08: qty 10

## 2014-04-08 MED ORDER — HEPARIN SOD (PORK) LOCK FLUSH 100 UNIT/ML IV SOLN
500.0000 [IU] | Freq: Once | INTRAVENOUS | Status: AC | PRN
  Administered 2014-04-08: 500 [IU]
  Filled 2014-04-08: qty 5

## 2014-04-08 MED ORDER — SODIUM CHLORIDE 0.9 % IJ SOLN
10.0000 mL | INTRAMUSCULAR | Status: DC | PRN
  Administered 2014-04-08: 10 mL via INTRAVENOUS
  Filled 2014-04-08: qty 10

## 2014-04-08 MED ORDER — ONDANSETRON 16 MG/50ML IVPB (CHCC)
16.0000 mg | Freq: Once | INTRAVENOUS | Status: AC
  Administered 2014-04-08: 16 mg via INTRAVENOUS

## 2014-04-08 MED ORDER — TRASTUZUMAB CHEMO INJECTION 440 MG
6.0000 mg/kg | Freq: Once | INTRAVENOUS | Status: AC
  Administered 2014-04-08: 525 mg via INTRAVENOUS
  Filled 2014-04-08: qty 25

## 2014-04-08 MED ORDER — SODIUM CHLORIDE 0.9 % IV SOLN
Freq: Once | INTRAVENOUS | Status: AC
  Administered 2014-04-08: 13:00:00 via INTRAVENOUS

## 2014-04-08 NOTE — Patient Instructions (Signed)
[facility] Discharge Instructions for Patients Receiving Chemotherapy  Today you received the following chemotherapy agents [facility]/Perjeta.  To help prevent nausea and vomiting after your treatment, we encourage you to take your nausea medication as directed.    If you develop nausea and vomiting that is not controlled by your nausea medication, call the clinic.   BELOW ARE SYMPTOMS THAT SHOULD BE REPORTED IMMEDIATELY:  *FEVER GREATER THAN 100.5 F  *CHILLS WITH OR WITHOUT FEVER  NAUSEA AND VOMITING THAT IS NOT CONTROLLED WITH YOUR NAUSEA MEDICATION  *UNUSUAL SHORTNESS OF BREATH  *UNUSUAL BRUISING OR BLEEDING  TENDERNESS IN MOUTH AND THROAT WITH OR WITHOUT PRESENCE OF ULCERS  *URINARY PROBLEMS  *BOWEL PROBLEMS  UNUSUAL RASH Items with * indicate a potential emergency and should be followed up as soon as possible.  Feel free to call the clinic you have any questions or concerns. The clinic phone number is [phone].

## 2014-04-08 NOTE — Progress Notes (Signed)
[place]  [phone] [phone]) [phone]     ID: Jessica [name] DOB: [birth year]  MR#: [id number]  VOH#:[id number]  Patient Care Team: [name], MD as PCP - General (Nurse Practitioner) [name], MD as Consulting Physician (General Surgery) [name], MD as Consulting Physician (Oncology) Jodelle Gross, MD as Consulting Physician (Radiation Oncology)  CHIEF COMPLAINT: Newly diagnosed triple positive breast cancer CURRENT TREATMENT: neoadjuvant chemo-immunotherapy  BREAST CANCER HISTORY: From the original intake note:  [name] herself palpated a mass in her left breast. She brought it to the attention of [name] at the health department and she was set up for unilateral left mammography and ultrasound 11/27/2013 at the [facility]. This showed a possible distortion in the left breast upper outer quadrant. However the patient's breasts are density category D. On exam there was a firm nodule or mass in the left breast 2:00 location which by ultrasound was irregular and hypoechoic and measured 1.5 cm. There was no left axillary lymphadenopathy noted.  Biopsy of the mass in question 11/29/2013 showed ([id number]) an invasive ductal carcinoma, grade 1, estrogen receptor 94% positive, progesterone receptor 94% positive, both with strong staining intensity, with an MIB-1 of 46%, and HER-2 amplified by CISH, the signals ratio of 4.85 and the number per cell 6.55.  : 12/08/2013 the patient underwent bilateral breast MRI. This showed in the left breast upper outer quadrant a 2.9 cm irregular enhancing mass and extending posteriorly from this an area of clumped non-masslike enhancement extending approximately 6 cm and leading to a posterior group of nodules which were small but measured in aggregate 2.2 cm. In addition, there was a separate 0.8 cm irregular spiculated enhancing mass in the upper inner aspect of the left breast. In the  lower outer left breast there was a 0.8 cm oval enhancing mass. There were no abnormal appearing lymph nodes.  [name] subsequent history is as detailed below  INTERVAL HISTORY: Jessica [name] returns today for follow up of her breast cancer, accompanied by her husband, Jessica [name]. Today is day 1, cycle 5 of 6 planned cycles of carboplatin, docetaxel, pertuzumab, and trastuzumab to be given every 3 weeks.   REVIEW OF SYSTEMS: [name] tells me she went and saw an orthopedist doctor and he said he could explain exactly why she had pain in her back, but that he could not do anything about it and that she needed pain medicine. Her pain is not controlled on nonsteroidals. It is in her back but also in her legs. She sleeps poorly, has blurred vision, is significantly constipated, and has had some Remeron at all bleeding as a result. He has a history of stress urinary incontinence and recently has been having some discomfort with urination. She bruises easily. She feels anxious, but not depressed. She is having hot flashes. She is not working but is also not on disability. She hopes to get back to her job eventually. A detailed review of systems today was otherwise noncontributory   PAST MEDICAL HISTORY: Past Medical History  Diagnosis Date  . Anxiety   . COPD (chronic obstructive pulmonary disease) 2011    does not use inhaler ofter  . Depression   . Hot flashes   . Family history of anesthesia complication     Had a hard time waking father up only once after several procedures  . Shortness of breath     with exertion  . Heart murmur     PMH: at age  20; no longer have murmur  . Chronic heartburn   . PVC (premature ventricular contraction)     PMH;at 48 y.o.  . Cancer     DCIS  . Asthma     denies history of asthma    PAST SURGICAL HISTORY: Past Surgical History  Procedure Laterality Date  . Polyp removal     . Tonsillectomy    . Wisdom tooth extraction    . Robotic assisted total hysterectomy  with bilateral salpingo oopherectomy Bilateral 05/24/2013    Procedure: ROBOTIC ASSISTED TOTAL HYSTERECTOMY WITH BILATERAL SALPINGECTOMY;  Surgeon: Lavonia Drafts, MD;  Location: [facility];  Service: Gynecology;  Laterality: Bilateral;  . Cystoscopy N/A 05/24/2013    Procedure: CYSTOSCOPY;  Surgeon: Lavonia Drafts, MD;  Location: [facility];  Service: Gynecology;  Laterality: N/A;  . Abdominal hysterectomy    . Breast surgery      left breast biopsy x 2  . Portacath placement Right 01/02/2014    Procedure: INSERTION PORT-A-CATH;  Surgeon: [name], MD;  Location: [facility];  Service: General;  Laterality: Right;    FAMILY HISTORY Family History  Problem Relation Age of Onset  . Heart disease Mother   . Hypercholesterolemia Mother   . Heart disease Father   . COPD Father   . Hypercholesterolemia Father   . Cancer Neg Hx   [name] parents are still living, her father is 5 years old and her mother 48 years old as of August 2015. The patient had no brothers, one sister. There is no history of breast or ovarian cancer in the family.  GYNECOLOGIC HISTORY:  Patient's last menstrual period was 03/18/2013. Menarche age 65. She is GX P0. She took oral contraceptives for many years as well as Depo Provera shots. She is status post hysterectomy with bilateral salpingectomy. 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 She scans at work, and she also does all this she'll vein and stocking. She is single but for the last 11 years as lives with her significant other [name]. He is disabled secondary to multiple orthopedic problems including bilateral avascular necrosis of the hips. Both of them do smoke. The patient is here for drinks on weekends    ADVANCED DIRECTIVES: Not in place    HEALTH MAINTENANCE: History  Substance Use Topics  . Smoking status: Current Every Day Smoker -- 1.00 packs/day for 16 years    Types: Cigarettes  .  Smokeless tobacco: Never Used  . Alcohol Use: Yes     Comment: occasional      Colonoscopy:  PAP: January 2015   Bone density:  Lipid panel:  No Known Allergies  Current Outpatient Prescriptions  Medication Sig Dispense Refill  . budesonide-formoterol (SYMBICORT) 160-4.5 MCG/ACT inhaler Inhale 1 puff into the lungs 2 (two) times daily.     . calcium carbonate (TUMS - DOSED IN MG ELEMENTAL CALCIUM) 500 MG chewable tablet Chew 1 tablet by mouth daily.    . cholestyramine (QUESTRAN) 4 G packet Take 1 packet (4 g total) by mouth 3 (three) times daily. (Patient not taking: Reported on 03/25/2014) 60 each 3  . cyclobenzaprine (FLEXERIL) 5 MG tablet Take 1 tablet (5 mg total) by mouth 2 (two) times daily as needed for muscle spasms. 60 tablet 1  . dexamethasone (DECADRON) 4 MG tablet Take 2 tablets (8 mg total) by mouth 2 (two) times daily. Start the day before Taxotere. Then again the day  after chemo for 3 days. (Patient not taking: Reported on 03/25/2014) 30 tablet 1  . fluconazole (DIFLUCAN) 100 MG tablet Take 1 tablet (100 mg total) by mouth daily. 10 tablet 0  . fluticasone (FLONASE) 50 MCG/ACT nasal spray Place 2 sprays into both nostrils daily. 16 g 2  . gabapentin (NEURONTIN) 300 MG capsule Take 1 capsule (300 mg total) by mouth 3 (three) times daily. (Patient not taking: Reported on 03/25/2014) 90 capsule 0  . gabapentin (NEURONTIN) 600 MG tablet Take 600 mg by mouth at bedtime as needed (anxiety).     Marland Kitchen HYDROcodone-acetaminophen (NORCO/VICODIN) 5-325 MG per tablet Take 1 tablet by mouth every 6 (six) hours as needed for moderate pain. 60 tablet 0  . hydrocodone-ibuprofen (VICOPROFEN) 5-200 MG per tablet Take 2 tablets by mouth every 8 (eight) hours as needed for pain. 60 tablet 0  . ibuprofen (ADVIL,MOTRIN) 800 MG tablet Take 800 mg by mouth every 8 (eight) hours as needed (pain).    Marland Kitchen ipratropium (ATROVENT HFA) 17 MCG/ACT inhaler Inhale 1 puff into the lungs every 6 (six) hours as needed  for wheezing (SOB).     Marland Kitchen lidocaine-prilocaine (EMLA) cream Apply 1 application topically as needed. Apply over port site 1-2 hours before chemotherapy, cover with plastic wrap (Patient not taking: Reported on 03/25/2014) 30 g 0  . loperamide (IMODIUM) 2 MG capsule TAKE ONE TABLET AFTER EACH LOOSE STOOL. MAXIMUM OF SIX TABLETS A DAY. CHEMOTHERAPY INDUCED DIARRHEA. (Patient not taking: Reported on 03/18/2014) 60 capsule 3  . loratadine (CLARITIN) 10 MG tablet Take 1 tablet (10 mg total) by mouth as needed. 90 tablet 1  . LORazepam (ATIVAN) 0.5 MG tablet Take 1 tablet (0.5 mg total) by mouth at bedtime as needed (Nausea or vomiting). 30 tablet 0  . nystatin (MYCOSTATIN) 100000 UNIT/ML suspension Take 5 mLs (500,000 Units total) by mouth 4 (four) times daily as needed (Swish and spit). (Patient not taking: Reported on 03/18/2014) 60 mL 0  . omeprazole (PRILOSEC) 40 MG capsule Take 1 capsule (40 mg total) by mouth daily. 30 capsule 2  . ondansetron (ZOFRAN) 8 MG tablet Take 1 tablet (8 mg total) by mouth 2 (two) times daily. Start the day after chemo for 3 days. Then take as needed for nausea or vomiting. 30 tablet 1  . prochlorperazine (COMPAZINE) 10 MG tablet Take 1 tablet (10 mg total) by mouth every 6 (six) hours as needed (Nausea or vomiting). (Patient not taking: Reported on 03/25/2014) 30 tablet 1  . tobramycin-dexamethasone (TOBRADEX) ophthalmic solution Place 1 drop into both eyes 2 (two) times daily. 5 mL 0  . traZODone (DESYREL) 50 MG tablet Take 1 tablet (50 mg total) by mouth at bedtime as needed for sleep. 30 tablet 3   No current facility-administered medications for this visit.    OBJECTIVE: middle-aged white woman who appears stated age 27 Vitals:   04/08/14 1014  BP: 137/79  Pulse: 108  Temp: 98.5 F (36.9 C)  Resp: 18     Body mass index is 34.51 kg/(m^2).    ECOG FS:2 - Symptomatic, <50% confined to bed  Sclerae unicteric, EOMs intact Oropharynx moist, no thrush noted No  cervical or supraclavicular adenopathy Lungs no rales or rhonchi Heart regular rate and rhythm Abd soft, nontender, positive bowel sounds MSK moderate focal spinal tenderness or the upper lumbar spine, no upper extremity lymphedema Neuro: nonfocal, well oriented, stressed affect Breasts: right breast unremarkable. I no longer palpate a mass in the left breast. There  are no skin changes of concern. The left axilla is benign.   LAB RESULTS:  CMP     Component Value Date/Time   NA 140 04/08/2014 0945   NA 139 12/31/2013 1448   K 4.0 04/08/2014 0945   K 4.6 12/31/2013 1448   CL 103 12/31/2013 1448   CO2 26 04/08/2014 0945   CO2 24 12/31/2013 1448   GLUCOSE 142* 04/08/2014 0945   GLUCOSE 93 12/31/2013 1448   BUN 10.9 04/08/2014 0945   BUN 9 12/31/2013 1448   CREATININE 0.9 04/08/2014 0945   CREATININE 0.83 12/31/2013 1448   CALCIUM 9.3 04/08/2014 0945   CALCIUM 8.8 12/31/2013 1448   PROT 6.4 04/08/2014 0945   PROT 7.1 06/04/2013 1511   ALBUMIN 3.7 04/08/2014 0945   ALBUMIN 3.4* 06/04/2013 1511   AST 14 04/08/2014 0945   AST 12 06/04/2013 1511   ALT 21 04/08/2014 0945   ALT 14 06/04/2013 1511   ALKPHOS 70 04/08/2014 0945   ALKPHOS 70 06/04/2013 1511   BILITOT 0.60 04/08/2014 0945   BILITOT 0.5 06/04/2013 1511   GFRNONAA 83* 12/31/2013 1448   GFRAA >90 12/31/2013 1448    I No results found for: SPEP  Lab Results  Component Value Date   WBC 7.7 04/08/2014   NEUTROABS 7.2* 04/08/2014   HGB 11.6 04/08/2014   HCT 34.4* 04/08/2014   MCV 102.4* 04/08/2014   PLT 109* 04/08/2014      Chemistry      Component Value Date/Time   NA 140 04/08/2014 0945   NA 139 12/31/2013 1448   K 4.0 04/08/2014 0945   K 4.6 12/31/2013 1448   CL 103 12/31/2013 1448   CO2 26 04/08/2014 0945   CO2 24 12/31/2013 1448   BUN 10.9 04/08/2014 0945   BUN 9 12/31/2013 1448   CREATININE 0.9 04/08/2014 0945   CREATININE 0.83 12/31/2013 1448      Component Value Date/Time   CALCIUM 9.3  04/08/2014 0945   CALCIUM 8.8 12/31/2013 1448   ALKPHOS 70 04/08/2014 0945   ALKPHOS 70 06/04/2013 1511   AST 14 04/08/2014 0945   AST 12 06/04/2013 1511   ALT 21 04/08/2014 0945   ALT 14 06/04/2013 1511   BILITOT 0.60 04/08/2014 0945   BILITOT 0.5 06/04/2013 1511       No results found for: LABCA2  No components found for: [id number]  No results for input(s): INR in the last 168 hours.  Urinalysis    Component Value Date/Time   COLORURINE YELLOW 06/04/2013 1420   APPEARANCEUR HAZY* 06/04/2013 1420   LABSPEC 1.030 01/27/2014 1350   LABSPEC >1.030* 06/04/2013 1420   PHURINE 6.0 06/04/2013 1420   GLUCOSEU Negative 01/27/2014 1350   GLUCOSEU NEGATIVE 06/04/2013 1420   HGBUR MODERATE* 06/04/2013 1420   HGBUR negative 10/07/2009 1044   BILIRUBINUR NEGATIVE 06/04/2013 1420   KETONESUR NEGATIVE 06/04/2013 1420   PROTEINUR NEGATIVE 06/04/2013 1420   UROBILINOGEN Color Interference 01/27/2014 1350   UROBILINOGEN 0.2 06/04/2013 1420   NITRITE NEGATIVE 06/04/2013 1420   LEUKOCYTESUR NEGATIVE 06/04/2013 1420    STUDIES: Dg Lumbar Spine Complete  03/19/2014   CLINICAL DATA:  Mid to low back pain for 3 weeks without known trauma ; history of breast malignancy  EXAM: LUMBAR SPINE - COMPLETE 4+ VIEW  COMPARISON:  None.  FINDINGS: The lumbar vertebral bodies are preserved in height. There is mild disc space narrowing at L5-S1. There is no spondylolisthesis. There is mild facet joint hypertrophy at L4-5 and  at L5-S1. The pedicles and transverse processes are intact. The observed portions of the sacrum are normal.  IMPRESSION: There are mild degenerative disc changes at L5-S1 and there is facet joint hypertrophy at L4-5 and L5-S1 bilaterally. There are no findings suspicious for metastatic disease.   Electronically Signed   By: David  Martinique   On: 03/19/2014 13:08    ASSESSMENT: 48 y.o. BRCA negative [place] woman status post left breast upper outer quadrant biopsy 11/29/2013 for a  clinical T2 N0, stage IIA invasive ductal carcinoma, grade 1, triple positive, with an MIB-1 of 46%.  (1) MRI-guided biopsy of two additional areas in the left breast showed only tubular adenomas, no malignancy.   (2) neoadjuvant chemotherapy to start 01/14/2014, consisting of carboplatin, docetaxel, trastuzumab and pertuzumab given every 21 days for 6 cycles, with Neulasta on day 2-- echocardiogram 12/19/2013 showed an ejection fraction in the 50-55% range.  (3) trastuzumab to be continued to the total one year, through September of 2016  (4) definitive surgery to follow chemotherapy  (5) radiation to follow surgery  (6) antiestrogen therapy to follow radiation  PLAN: Elliemae will proceed to the fifth of 6 cycles of her chemotherapy today. I think there was some confusion on on what to take for diarrhea and about to take for constipation, but we went over that in detail today and basically currently, being constipated, she will take Miralax daily and anywhere from 2-6 stool softeners daily. If she does not have a bowel movement after 3 days she will take a bottle of magnesium citrate.  Once she starts getting diarrhea from the pertuzumab, she will stop all that, of course, and start Questran 2-3 times a day and Imodium as needed. As soon as the diarrhea stops, she will go back to the constipation protocol.  Today I wrote her for hydrocodone/ APAP, to take 1 or 2 tablets up to 3 times a day. She will let me know if that does not take care of the pain for her. She can take Tylenol additionally if she wishes. Of course she understands this will make the diarrhea worse.  Medicaid did not approve for her interval MRI, so I am sending her for a repeat mammogram and ultrasound. Dr. Donne Hazel should have that available next time he sees her, at which time he will plan her surgery. Of course she will continue the Herceptin alone after her sixth cycle of chemotherapy, which is scheduled for 04/30/2013.     [name], MD   04/09/2014 7:54 PM

## 2014-04-08 NOTE — Telephone Encounter (Signed)
, °

## 2014-04-08 NOTE — Patient Instructions (Signed)

## 2014-04-09 ENCOUNTER — Ambulatory Visit (HOSPITAL_BASED_OUTPATIENT_CLINIC_OR_DEPARTMENT_OTHER): Payer: Medicaid Other

## 2014-04-09 DIAGNOSIS — C50412 Malignant neoplasm of upper-outer quadrant of left female breast: Secondary | ICD-10-CM

## 2014-04-09 DIAGNOSIS — Z5189 Encounter for other specified aftercare: Secondary | ICD-10-CM

## 2014-04-09 MED ORDER — PEGFILGRASTIM INJECTION 6 MG/0.6ML ~~LOC~~
6.0000 mg | PREFILLED_SYRINGE | Freq: Once | SUBCUTANEOUS | Status: AC
  Administered 2014-04-09: 6 mg via SUBCUTANEOUS
  Filled 2014-04-09: qty 0.6

## 2014-04-09 NOTE — Patient Instructions (Signed)
Pegfilgrastim injection What is this medicine? PEGFILGRASTIM (peg fil GRA stim) is a long-acting granulocyte colony-stimulating factor that stimulates the growth of neutrophils, a type of white blood cell important in the body's fight against infection. It is used to reduce the incidence of fever and infection in patients with certain types of cancer who are receiving chemotherapy that affects the bone marrow. This medicine may be used for other purposes; ask your health care provider or pharmacist if you have questions. COMMON BRAND NAME(S): Neulasta What should I tell my health care provider before I take this medicine? They need to know if you have any of these conditions: -latex allergy -ongoing radiation therapy -sickle cell disease -skin reactions to acrylic adhesives (On-Body Injector only) -an unusual or allergic reaction to pegfilgrastim, filgrastim, other medicines, foods, dyes, or preservatives -pregnant or trying to get pregnant -breast-feeding How should I use this medicine? This medicine is for injection under the skin. If you get this medicine at home, you will be taught how to prepare and give the pre-filled syringe or how to use the On-body Injector. Refer to the patient Instructions for Use for detailed instructions. Use exactly as directed. Take your medicine at regular intervals. Do not take your medicine more often than directed. It is important that you put your used needles and syringes in a special sharps container. Do not put them in a trash can. If you do not have a sharps container, call your pharmacist or healthcare provider to get one. Talk to your pediatrician regarding the use of this medicine in children. Special care may be needed. Overdosage: If you think you have taken too much of this medicine contact a poison control center or emergency room at once. NOTE: This medicine is only for you. Do not share this medicine with others. What if I miss a dose? It is  important not to miss your dose. Call your doctor or health care professional if you miss your dose. If you miss a dose due to an On-body Injector failure or leakage, a new dose should be administered as soon as possible using a single prefilled syringe for manual use. What may interact with this medicine? Interactions have not been studied. Give your health care provider a list of all the medicines, herbs, non-prescription drugs, or dietary supplements you use. Also tell them if you smoke, drink alcohol, or use illegal drugs. Some items may interact with your medicine. This list may not describe all possible interactions. Give your health care provider a list of all the medicines, herbs, non-prescription drugs, or dietary supplements you use. Also tell them if you smoke, drink alcohol, or use illegal drugs. Some items may interact with your medicine. What should I watch for while using this medicine? You may need blood work done while you are taking this medicine. If you are going to need a MRI, CT scan, or other procedure, tell your doctor that you are using this medicine (On-Body Injector only). What side effects may I notice from receiving this medicine? Side effects that you should report to your doctor or health care professional as soon as possible: -allergic reactions like skin rash, itching or hives, swelling of the face, lips, or tongue -dizziness -fever -pain, redness, or irritation at site where injected -pinpoint red spots on the skin -shortness of breath or breathing problems -stomach or side pain, or pain at the shoulder -swelling -tiredness -trouble passing urine Side effects that usually do not require medical attention (report to your doctor   or health care professional if they continue or are bothersome): -bone pain -muscle pain This list may not describe all possible side effects. Call your doctor for medical advice about side effects. You may report side effects to FDA at  1-800-FDA-1088. Where should I keep my medicine? Keep out of the reach of children. Store pre-filled syringes in a refrigerator between 2 and 8 degrees C (36 and 46 degrees F). Do not freeze. Keep in carton to protect from light. Throw away this medicine if it is left out of the refrigerator for more than 48 hours. Throw away any unused medicine after the expiration date. NOTE: This sheet is a summary. It may not cover all possible information. If you have questions about this medicine, talk to your doctor, pharmacist, or health care provider.  2015, Elsevier/Gold Standard. (2013-07-04 16:14:05)  

## 2014-04-15 ENCOUNTER — Ambulatory Visit (HOSPITAL_BASED_OUTPATIENT_CLINIC_OR_DEPARTMENT_OTHER): Payer: Medicaid Other | Admitting: Nurse Practitioner

## 2014-04-15 ENCOUNTER — Encounter: Payer: Self-pay | Admitting: Nurse Practitioner

## 2014-04-15 ENCOUNTER — Other Ambulatory Visit (HOSPITAL_BASED_OUTPATIENT_CLINIC_OR_DEPARTMENT_OTHER): Payer: Medicaid Other

## 2014-04-15 VITALS — BP 131/77 | HR 94 | Temp 98.0°F | Resp 18 | Ht 65.0 in | Wt 198.7 lb

## 2014-04-15 DIAGNOSIS — Z170 Estrogen receptor positive status [ER+]: Secondary | ICD-10-CM

## 2014-04-15 DIAGNOSIS — C50412 Malignant neoplasm of upper-outer quadrant of left female breast: Secondary | ICD-10-CM

## 2014-04-15 DIAGNOSIS — Z720 Tobacco use: Secondary | ICD-10-CM

## 2014-04-15 LAB — COMPREHENSIVE METABOLIC PANEL (CC13)
ALT: 24 U/L (ref 0–55)
ANION GAP: 11 meq/L (ref 3–11)
AST: 14 U/L (ref 5–34)
Albumin: 4.1 g/dL (ref 3.5–5.0)
Alkaline Phosphatase: 79 U/L (ref 40–150)
BILIRUBIN TOTAL: 0.52 mg/dL (ref 0.20–1.20)
BUN: 11.1 mg/dL (ref 7.0–26.0)
CO2: 26 meq/L (ref 22–29)
CREATININE: 0.9 mg/dL (ref 0.6–1.1)
Calcium: 9.8 mg/dL (ref 8.4–10.4)
Chloride: 104 mEq/L (ref 98–109)
EGFR: 81 mL/min/{1.73_m2} — AB (ref >90)
GLUCOSE: 115 mg/dL (ref 70–140)
POTASSIUM: 3.8 meq/L (ref 3.5–5.1)
SODIUM: 140 meq/L (ref 136–145)
Total Protein: 6.9 g/dL (ref 6.4–8.3)

## 2014-04-15 LAB — CBC WITH DIFFERENTIAL/PLATELET
BASO%: 0.5 % (ref 0.0–2.0)
Basophils Absolute: 0 10*3/uL (ref 0.0–0.1)
EOS ABS: 0 10*3/uL (ref 0.0–0.5)
EOS%: 0.2 % (ref 0.0–7.0)
HCT: 35.3 % (ref 34.8–46.6)
HGB: 11.9 g/dL (ref 11.6–15.9)
LYMPH%: 25.5 % (ref 14.0–49.7)
MCH: 34.8 pg — ABNORMAL HIGH (ref 25.1–34.0)
MCHC: 33.6 g/dL (ref 31.5–36.0)
MCV: 103.5 fL — ABNORMAL HIGH (ref 79.5–101.0)
MONO#: 0.7 10*3/uL (ref 0.1–0.9)
MONO%: 12.2 % (ref 0.0–14.0)
NEUT#: 3.7 10*3/uL (ref 1.5–6.5)
NEUT%: 61.6 % (ref 38.4–76.8)
Platelets: 169 10*3/uL (ref 145–400)
RBC: 3.41 10*6/uL — ABNORMAL LOW (ref 3.70–5.45)
RDW: 18.4 % — ABNORMAL HIGH (ref 11.2–14.5)
WBC: 6 10*3/uL (ref 3.9–10.3)
lymph#: 1.5 10*3/uL (ref 0.9–3.3)

## 2014-04-15 NOTE — Progress Notes (Signed)
[place]  [phone] Fax:(336) [phone]     ID: Jessica [name] DOB: [birth year]  MR#: [id number]  FXO#:[id number]  Patient Care Team: [name], MD as PCP - General (Nurse Practitioner) [name], MD as Consulting Physician (General Surgery) [name], MD as Consulting Physician (Oncology) Jodelle Gross, MD as Consulting Physician (Radiation Oncology)  CHIEF COMPLAINT: Newly diagnosed triple positive breast cancer CURRENT TREATMENT: neoadjuvant chemo-immunotherapy  BREAST CANCER HISTORY: From the original intake note:  Jessica [name] herself palpated a mass in her left breast. She brought it to the attention of [name] at the health department and she was set up for unilateral left mammography and ultrasound 11/27/2013 at the [facility]. This showed a possible distortion in the left breast upper outer quadrant. However the patient's breasts are density category D. On exam there was a firm nodule or mass in the left breast 2:00 location which by ultrasound was irregular and hypoechoic and measured 1.5 cm. There was no left axillary lymphadenopathy noted.  Biopsy of the mass in question 11/29/2013 showed ([id number]) an invasive ductal carcinoma, grade 1, estrogen receptor 94% positive, progesterone receptor 94% positive, both with strong staining intensity, with an MIB-1 of 46%, and HER-2 amplified by CISH, the signals ratio of 4.85 and the number per cell 6.55.  : 12/08/2013 the patient underwent bilateral breast MRI. This showed in the left breast upper outer quadrant a 2.9 cm irregular enhancing mass and extending posteriorly from this an area of clumped non-masslike enhancement extending approximately 6 cm and leading to a posterior group of nodules which were small but measured in aggregate 2.2 cm. In addition, there was a separate 0.8 cm irregular spiculated enhancing mass in the upper inner aspect of the left breast. In the  lower outer left breast there was a 0.8 cm oval enhancing mass. There were no abnormal appearing lymph nodes.  [name] subsequent history is as detailed below  INTERVAL HISTORY: Jessica [name] returns today for follow up of her breast cancer, accompanied by her husband, [name]. Today is day 8, cycle 5 of 6 planned cycles of carboplatin, docetaxel, pertuzumab, and trastuzumab to be given every 3 weeks.   REVIEW OF SYSTEMS: Jessica [name] denies fevers, chills, nausea, or vomiting. She had diarrhea x 2 days that turned into constipation and eventually resolved. She has no mouth sore or rashes. She has begun to take fluconazole along with the dexamethasone to prevent thrush. Her appetite can be low as her taste changes but she tries to eat what she can. The hydrocodone Dr. Jana Hakim prescribed for her back is very helpful. Her energy level is up and down. She denies shortness of breath, chest pain, cough, or palpitations. She has hot flashes daily. She continues to have some difficulty with urination and lower abdominal discomfort. She is anxious but not depressed. A detailed review of systems is otherwise negative.    PAST MEDICAL HISTORY: Past Medical History  Diagnosis Date  . Anxiety   . COPD (chronic obstructive pulmonary disease) 2011    does not use inhaler ofter  . Depression   . Hot flashes   . Family history of anesthesia complication     Had a hard time waking father up only once after several procedures  . Shortness of breath     with exertion  . Heart murmur     PMH: at age 75; no longer have murmur  . Chronic heartburn   . PVC (premature ventricular contraction)  PMH;at 48 y.o.  . Cancer     DCIS  . Asthma     denies history of asthma    PAST SURGICAL HISTORY: Past Surgical History  Procedure Laterality Date  . Polyp removal     . Tonsillectomy    . Wisdom tooth extraction    . Robotic assisted total hysterectomy with bilateral salpingo oopherectomy Bilateral 05/24/2013     Procedure: ROBOTIC ASSISTED TOTAL HYSTERECTOMY WITH BILATERAL SALPINGECTOMY;  Surgeon: Lavonia Drafts, MD;  Location: [facility];  Service: Gynecology;  Laterality: Bilateral;  . Cystoscopy N/A 05/24/2013    Procedure: CYSTOSCOPY;  Surgeon: Lavonia Drafts, MD;  Location: [facility];  Service: Gynecology;  Laterality: N/A;  . Abdominal hysterectomy    . Breast surgery      left breast biopsy x 2  . Portacath placement Right 01/02/2014    Procedure: INSERTION PORT-A-CATH;  Surgeon: [name], MD;  Location: [facility] OR;  Service: General;  Laterality: Right;    FAMILY HISTORY Family History  Problem Relation Age of Onset  . Heart disease Mother   . Hypercholesterolemia Mother   . Heart disease Father   . COPD Father   . Hypercholesterolemia Father   . Cancer Neg Hx   [name] parents are still living, her father is 3 years old and her mother 18 years old as of August 2015. The patient had no brothers, one sister. There is no history of breast or ovarian cancer in the family.  GYNECOLOGIC HISTORY:  Patient's last menstrual period was 03/18/2013. Menarche age 65. She is GX P0. She took oral contraceptives for many years as well as Depo Provera shots. She is status post hysterectomy with bilateral salpingectomy. 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 She scans at work, and she also does all this she'll vein and stocking. She is single but for the last 11 years as lives with her significant other [name]. He is disabled secondary to multiple orthopedic problems including bilateral avascular necrosis of the hips. Both of them do smoke. The patient is here for drinks on weekends    ADVANCED DIRECTIVES: Not in place    HEALTH MAINTENANCE: History  Substance Use Topics  . Smoking status: Current Every Day Smoker -- 1.00 packs/day for 16 years    Types: Cigarettes  . Smokeless tobacco: Never Used  . Alcohol Use: Yes      Comment: occasional      Colonoscopy:  PAP: January 2015   Bone density:  Lipid panel:  No Known Allergies  Current Outpatient Prescriptions  Medication Sig Dispense Refill  . calcium carbonate (TUMS - DOSED IN MG ELEMENTAL CALCIUM) 500 MG chewable tablet Chew 1 tablet by mouth daily.    . cyclobenzaprine (FLEXERIL) 5 MG tablet Take 1 tablet (5 mg total) by mouth 2 (two) times daily as needed for muscle spasms. 60 tablet 1  . dexamethasone (DECADRON) 4 MG tablet Take 2 tablets (8 mg total) by mouth 2 (two) times daily. Start the day before Taxotere. Then again the day after chemo for 3 days. 30 tablet 1  . fluconazole (DIFLUCAN) 100 MG tablet Take 1 tablet (100 mg total) by mouth daily. 10 tablet 0  . fluticasone (FLONASE) 50 MCG/ACT nasal spray Place 2 sprays into both nostrils daily. 16 g 2  . gabapentin (NEURONTIN) 300 MG capsule Take 1 capsule (300 mg total) by mouth 3 (three) times daily. 90 capsule 0  .  HYDROcodone-acetaminophen (NORCO/VICODIN) 5-325 MG per tablet Take 1 tablet by mouth every 6 (six) hours as needed for moderate pain. 60 tablet 0  . ipratropium (ATROVENT HFA) 17 MCG/ACT inhaler Inhale 1 puff into the lungs every 6 (six) hours as needed for wheezing (SOB).     Marland Kitchen lidocaine-prilocaine (EMLA) cream Apply 1 application topically as needed. Apply over port site 1-2 hours before chemotherapy, cover with plastic wrap 30 g 0  . loratadine (CLARITIN) 10 MG tablet Take 1 tablet (10 mg total) by mouth as needed. 90 tablet 1  . LORazepam (ATIVAN) 0.5 MG tablet Take 1 tablet (0.5 mg total) by mouth at bedtime as needed (Nausea or vomiting). 30 tablet 0  . omeprazole (PRILOSEC) 40 MG capsule Take 1 capsule (40 mg total) by mouth daily. 30 capsule 2  . ondansetron (ZOFRAN) 8 MG tablet Take 1 tablet (8 mg total) by mouth 2 (two) times daily. Start the day after chemo for 3 days. Then take as needed for nausea or vomiting. 30 tablet 1  . tobramycin-dexamethasone (TOBRADEX) ophthalmic  solution Place 1 drop into both eyes 2 (two) times daily. 5 mL 0  . ANTI-DIARRHEAL 2 MG tablet   3  . budesonide-formoterol (SYMBICORT) 160-4.5 MCG/ACT inhaler Inhale 1 puff into the lungs 2 (two) times daily.     . cholestyramine (QUESTRAN) 4 G packet Take 1 packet (4 g total) by mouth 3 (three) times daily. (Patient not taking: Reported on 03/25/2014) 60 each 3  . hydrocodone-ibuprofen (VICOPROFEN) 5-200 MG per tablet Take 2 tablets by mouth every 8 (eight) hours as needed for pain. (Patient not taking: Reported on 04/15/2014) 60 tablet 0  . ibuprofen (ADVIL,MOTRIN) 800 MG tablet Take 800 mg by mouth every 8 (eight) hours as needed (pain).    Marland Kitchen loperamide (IMODIUM) 2 MG capsule TAKE ONE TABLET AFTER EACH LOOSE STOOL. MAXIMUM OF SIX TABLETS A DAY. CHEMOTHERAPY INDUCED DIARRHEA. (Patient not taking: Reported on 03/18/2014) 60 capsule 3  . nystatin (MYCOSTATIN) 100000 UNIT/ML suspension Take 5 mLs (500,000 Units total) by mouth 4 (four) times daily as needed (Swish and spit). (Patient not taking: Reported on 03/18/2014) 60 mL 0  . prochlorperazine (COMPAZINE) 10 MG tablet Take 1 tablet (10 mg total) by mouth every 6 (six) hours as needed (Nausea or vomiting). (Patient not taking: Reported on 03/25/2014) 30 tablet 1  . traZODone (DESYREL) 50 MG tablet Take 1 tablet (50 mg total) by mouth at bedtime as needed for sleep. (Patient not taking: Reported on 04/15/2014) 30 tablet 3   No current facility-administered medications for this visit.    OBJECTIVE: middle-aged white woman who appears stated age 48 Vitals:   04/15/14 1111  BP: 131/77  Pulse: 94  Temp: 98 F (36.7 C)  Resp: 18     Body mass index is 33.07 kg/(m^2).    ECOG FS:1 - Symptomatic but completely ambulatory  Skin: warm, dry  HEENT: sclerae anicteric, conjunctivae pink, oropharynx clear. No thrush or mucositis.  Lymph Nodes: No cervical or supraclavicular lymphadenopathy  Lungs: clear to auscultation bilaterally, no rales, wheezes,  or rhonci  Heart: regular rate and rhythm  Abdomen: round, soft, non tender, positive bowel sounds  Musculoskeletal: No focal spinal tenderness, no peripheral edema  Neuro: non focal, well oriented, positive affect  Breasts: deferred  LAB RESULTS:  CMP     Component Value Date/Time   NA 140 04/15/2014 1032   NA 139 12/31/2013 1448   K 3.8 04/15/2014 1032   K 4.6 12/31/2013  1448   CL 103 12/31/2013 1448   CO2 26 04/15/2014 1032   CO2 24 12/31/2013 1448   GLUCOSE 115 04/15/2014 1032   GLUCOSE 93 12/31/2013 1448   BUN 11.1 04/15/2014 1032   BUN 9 12/31/2013 1448   CREATININE 0.9 04/15/2014 1032   CREATININE 0.83 12/31/2013 1448   CALCIUM 9.8 04/15/2014 1032   CALCIUM 8.8 12/31/2013 1448   PROT 6.9 04/15/2014 1032   PROT 7.1 06/04/2013 1511   ALBUMIN 4.1 04/15/2014 1032   ALBUMIN 3.4* 06/04/2013 1511   AST 14 04/15/2014 1032   AST 12 06/04/2013 1511   ALT 24 04/15/2014 1032   ALT 14 06/04/2013 1511   ALKPHOS 79 04/15/2014 1032   ALKPHOS 70 06/04/2013 1511   BILITOT 0.52 04/15/2014 1032   BILITOT 0.5 06/04/2013 1511   GFRNONAA 83* 12/31/2013 1448   GFRAA >90 12/31/2013 1448    I No results found for: SPEP  Lab Results  Component Value Date   WBC 6.0 04/15/2014   NEUTROABS 3.7 04/15/2014   HGB 11.9 04/15/2014   HCT 35.3 04/15/2014   MCV 103.5* 04/15/2014   PLT 169 04/15/2014      Chemistry      Component Value Date/Time   NA 140 04/15/2014 1032   NA 139 12/31/2013 1448   K 3.8 04/15/2014 1032   K 4.6 12/31/2013 1448   CL 103 12/31/2013 1448   CO2 26 04/15/2014 1032   CO2 24 12/31/2013 1448   BUN 11.1 04/15/2014 1032   BUN 9 12/31/2013 1448   CREATININE 0.9 04/15/2014 1032   CREATININE 0.83 12/31/2013 1448      Component Value Date/Time   CALCIUM 9.8 04/15/2014 1032   CALCIUM 8.8 12/31/2013 1448   ALKPHOS 79 04/15/2014 1032   ALKPHOS 70 06/04/2013 1511   AST 14 04/15/2014 1032   AST 12 06/04/2013 1511   ALT 24 04/15/2014 1032   ALT 14 06/04/2013  1511   BILITOT 0.52 04/15/2014 1032   BILITOT 0.5 06/04/2013 1511       No results found for: LABCA2  No components found for: HWKGS811  No results for input(s): INR in the last 168 hours.  Urinalysis    Component Value Date/Time   COLORURINE YELLOW 06/04/2013 1420   APPEARANCEUR HAZY* 06/04/2013 1420   LABSPEC 1.030 01/27/2014 1350   LABSPEC >1.030* 06/04/2013 1420   PHURINE 6.0 06/04/2013 1420   GLUCOSEU Negative 01/27/2014 1350   GLUCOSEU NEGATIVE 06/04/2013 1420   HGBUR MODERATE* 06/04/2013 1420   HGBUR negative 10/07/2009 1044   BILIRUBINUR NEGATIVE 06/04/2013 1420   KETONESUR NEGATIVE 06/04/2013 1420   PROTEINUR NEGATIVE 06/04/2013 1420   UROBILINOGEN Color Interference 01/27/2014 1350   UROBILINOGEN 0.2 06/04/2013 1420   NITRITE NEGATIVE 06/04/2013 1420   LEUKOCYTESUR NEGATIVE 06/04/2013 1420    STUDIES: Dg Lumbar Spine Complete  03/19/2014   CLINICAL DATA:  Mid to low back pain for 3 weeks without known trauma ; history of breast malignancy  EXAM: LUMBAR SPINE - COMPLETE 4+ VIEW  COMPARISON:  None.  FINDINGS: The lumbar vertebral bodies are preserved in height. There is mild disc space narrowing at L5-S1. There is no spondylolisthesis. There is mild facet joint hypertrophy at L4-5 and at L5-S1. The pedicles and transverse processes are intact. The observed portions of the sacrum are normal.  IMPRESSION: There are mild degenerative disc changes at L5-S1 and there is facet joint hypertrophy at L4-5 and L5-S1 bilaterally. There are no findings suspicious for metastatic disease.  Electronically Signed   By: David  Martinique   On: 03/19/2014 13:08    ASSESSMENT: 48 y.o. BRCA negative Jessica [name] woman status post left breast upper outer quadrant biopsy 11/29/2013 for a clinical T2 N0, stage IIA invasive ductal carcinoma, grade 1, triple positive, with an MIB-1 of 46%.  (1) MRI-guided biopsy of two additional areas in the left breast showed only tubular adenomas, no  malignancy.   (2) neoadjuvant chemotherapy to start 01/14/2014, consisting of carboplatin, docetaxel, trastuzumab and pertuzumab given every 21 days for 6 cycles, with Neulasta on day 2-- echocardiogram 12/19/2013 showed an ejection fraction in the 50-55% range.  (3) trastuzumab to be continued to the total one year, through September of 2016  (4) definitive surgery to follow chemotherapy  (5) radiation to follow surgery  (6) antiestrogen therapy to follow radiation  PLAN: Alieyah is doing well today. The labs were reviewed in detail and were entirely stable. She is excited that she only has 1 treatment remaining to finish out this chemotherapy regimen. She already has an appointment with Dr. Donne Hazel to discuss surgery. She has a repeat mammogram scheduled for next Wednesday as the breast MRI was denied by her insurance company.   Camari will return in 2 weeks for her 6th and final dose of carboplatin, docetaxel, trastuzumab, and pertuzumab. She understands and agrees with this plan. She knows the goal of treatment in her case is cure. She has been encouraged to call with any issues that might arise before her next visit here.    Marcelino Duster, NP   04/15/2014 11:52 AM

## 2014-04-16 ENCOUNTER — Other Ambulatory Visit: Payer: Self-pay | Admitting: *Deleted

## 2014-04-16 ENCOUNTER — Telehealth: Payer: Self-pay | Admitting: *Deleted

## 2014-04-16 MED ORDER — HYDROCORTISONE ACETATE 25 MG RE SUPP: 25.0000 mg | Freq: Two times a day (BID) | RECTAL | Status: DC

## 2014-04-16 NOTE — Telephone Encounter (Signed)
Pt called to this RN to state onset of rectal bleeding with BM's from hemorrhoids and constipation.  Bleeding is with BM only. Pt does not have bleeding in underclothes.  Per discussion pt states presently stools are soft post treating constipation but that caused bleeding from hemorrhoids.  Pt is using tucks pads at present with minimal relief.  Per review with MD and labs from 12/29 prescription for anusol supp sent to pharmacy.  Above as well as need to keep stools moving frequently and for stools to be soft for less bleeding- pt will continue use of stool softners. Discussed other comfort measures.

## 2014-04-17 ENCOUNTER — Other Ambulatory Visit: Payer: Self-pay | Admitting: Emergency Medicine

## 2014-04-17 MED ORDER — HYDROCORTISONE 2.5 % RE CREA: 1.0000 "application " | TOPICAL_CREAM | Freq: Two times a day (BID) | RECTAL | Status: DC

## 2014-04-23 ENCOUNTER — Other Ambulatory Visit: Payer: Self-pay | Admitting: Oncology

## 2014-04-23 ENCOUNTER — Ambulatory Visit
Admission: RE | Admit: 2014-04-23 | Discharge: 2014-04-23 | Disposition: A | Discharge status: Home / Self Care | Payer: Medicaid Other | Source: Ambulatory Visit | Attending: Oncology | Admitting: Oncology

## 2014-04-23 ENCOUNTER — Telehealth: Payer: Self-pay | Admitting: Oncology

## 2014-04-23 DIAGNOSIS — C50412 Malignant neoplasm of upper-outer quadrant of left female breast: Secondary | ICD-10-CM

## 2014-04-23 NOTE — Telephone Encounter (Signed)
per pof to sch pt MRI-cld & spoke to pt and adv pt that CS will be callingto sch MRI directly with her-pt understood

## 2014-04-29 ENCOUNTER — Other Ambulatory Visit: Payer: Medicaid Other

## 2014-04-29 ENCOUNTER — Ambulatory Visit: Payer: Medicaid Other | Admitting: Nurse Practitioner

## 2014-04-30 ENCOUNTER — Encounter: Payer: Self-pay | Admitting: Nurse Practitioner

## 2014-04-30 ENCOUNTER — Other Ambulatory Visit: Payer: Self-pay | Admitting: *Deleted

## 2014-04-30 ENCOUNTER — Telehealth: Payer: Self-pay | Admitting: Nurse Practitioner

## 2014-04-30 ENCOUNTER — Ambulatory Visit (HOSPITAL_BASED_OUTPATIENT_CLINIC_OR_DEPARTMENT_OTHER): Payer: Medicaid Other

## 2014-04-30 ENCOUNTER — Other Ambulatory Visit: Payer: Medicaid Other

## 2014-04-30 ENCOUNTER — Ambulatory Visit (HOSPITAL_BASED_OUTPATIENT_CLINIC_OR_DEPARTMENT_OTHER): Payer: Medicaid Other | Admitting: Nurse Practitioner

## 2014-04-30 ENCOUNTER — Ambulatory Visit: Payer: Medicaid Other

## 2014-04-30 ENCOUNTER — Other Ambulatory Visit: Payer: Self-pay | Admitting: Oncology

## 2014-04-30 ENCOUNTER — Telehealth: Payer: Self-pay | Admitting: *Deleted

## 2014-04-30 ENCOUNTER — Other Ambulatory Visit (HOSPITAL_BASED_OUTPATIENT_CLINIC_OR_DEPARTMENT_OTHER): Payer: Medicaid Other

## 2014-04-30 VITALS — BP 132/84 | HR 100 | Temp 97.8°F | Resp 20 | Wt 209.9 lb

## 2014-04-30 DIAGNOSIS — Z5112 Encounter for antineoplastic immunotherapy: Secondary | ICD-10-CM

## 2014-04-30 DIAGNOSIS — R1032 Left lower quadrant pain: Secondary | ICD-10-CM

## 2014-04-30 DIAGNOSIS — C50412 Malignant neoplasm of upper-outer quadrant of left female breast: Secondary | ICD-10-CM

## 2014-04-30 DIAGNOSIS — G4700 Insomnia, unspecified: Secondary | ICD-10-CM

## 2014-04-30 DIAGNOSIS — Z5111 Encounter for antineoplastic chemotherapy: Secondary | ICD-10-CM

## 2014-04-30 DIAGNOSIS — Z95828 Presence of other vascular implants and grafts: Secondary | ICD-10-CM

## 2014-04-30 DIAGNOSIS — Z170 Estrogen receptor positive status [ER+]: Secondary | ICD-10-CM

## 2014-04-30 LAB — CBC WITH DIFFERENTIAL/PLATELET
BASO%: 0 % (ref 0.0–2.0)
BASOS ABS: 0 10*3/uL (ref 0.0–0.1)
EOS%: 0 % (ref 0.0–7.0)
Eosinophils Absolute: 0 10*3/uL (ref 0.0–0.5)
HCT: 33.1 % — ABNORMAL LOW (ref 34.8–46.6)
HGB: 11 g/dL — ABNORMAL LOW (ref 11.6–15.9)
LYMPH%: 4.8 % — ABNORMAL LOW (ref 14.0–49.7)
MCH: 35 pg — ABNORMAL HIGH (ref 25.1–34.0)
MCHC: 33.2 g/dL (ref 31.5–36.0)
MCV: 105.4 fL — ABNORMAL HIGH (ref 79.5–101.0)
MONO#: 0.1 10*3/uL (ref 0.1–0.9)
MONO%: 1 % (ref 0.0–14.0)
NEUT%: 94.2 % — ABNORMAL HIGH (ref 38.4–76.8)
NEUTROS ABS: 7.7 10*3/uL — AB (ref 1.5–6.5)
PLATELETS: 121 10*3/uL — AB (ref 145–400)
RBC: 3.14 10*6/uL — AB (ref 3.70–5.45)
RDW: 18.7 % — ABNORMAL HIGH (ref 11.2–14.5)
WBC: 8.2 10*3/uL (ref 3.9–10.3)
lymph#: 0.4 10*3/uL — ABNORMAL LOW (ref 0.9–3.3)

## 2014-04-30 LAB — COMPREHENSIVE METABOLIC PANEL (CC13)
ALT: 17 U/L (ref 0–55)
AST: 13 U/L (ref 5–34)
Albumin: 3.7 g/dL (ref 3.5–5.0)
Alkaline Phosphatase: 66 U/L (ref 40–150)
Anion Gap: 11 meq/L (ref 3–11)
BUN: 10 mg/dL (ref 7.0–26.0)
CO2: 24 meq/L (ref 22–29)
Calcium: 9.1 mg/dL (ref 8.4–10.4)
Chloride: 107 meq/L (ref 98–109)
Creatinine: 0.8 mg/dL (ref 0.6–1.1)
EGFR: 89 mL/min/{1.73_m2} — ABNORMAL LOW
Glucose: 140 mg/dL (ref 70–140)
Potassium: 3.8 meq/L (ref 3.5–5.1)
Sodium: 142 meq/L (ref 136–145)
Total Bilirubin: 0.52 mg/dL (ref 0.20–1.20)
Total Protein: 6.3 g/dL — ABNORMAL LOW (ref 6.4–8.3)

## 2014-04-30 MED ORDER — DIPHENHYDRAMINE HCL 25 MG PO CAPS
25.0000 mg | ORAL_CAPSULE | Freq: Once | ORAL | Status: AC
  Administered 2014-04-30: 25 mg via ORAL

## 2014-04-30 MED ORDER — SODIUM CHLORIDE 0.9 % IV SOLN
420.0000 mg | Freq: Once | INTRAVENOUS | Status: AC
  Administered 2014-04-30: 420 mg via INTRAVENOUS
  Filled 2014-04-30: qty 14

## 2014-04-30 MED ORDER — DEXAMETHASONE SODIUM PHOSPHATE 20 MG/5ML IJ SOLN
20.0000 mg | Freq: Once | INTRAMUSCULAR | Status: AC
  Administered 2014-04-30: 20 mg via INTRAVENOUS

## 2014-04-30 MED ORDER — DEXAMETHASONE SODIUM PHOSPHATE 20 MG/5ML IJ SOLN
INTRAMUSCULAR | Status: AC
  Filled 2014-04-30: qty 5

## 2014-04-30 MED ORDER — ONDANSETRON 16 MG/50ML IVPB (CHCC)
16.0000 mg | Freq: Once | INTRAVENOUS | Status: AC
  Administered 2014-04-30: 16 mg via INTRAVENOUS

## 2014-04-30 MED ORDER — ACETAMINOPHEN 325 MG PO TABS
650.0000 mg | ORAL_TABLET | Freq: Once | ORAL | Status: AC
  Administered 2014-04-30: 650 mg via ORAL

## 2014-04-30 MED ORDER — ONDANSETRON 16 MG/50ML IVPB (CHCC)
INTRAVENOUS | Status: AC
  Filled 2014-04-30: qty 16

## 2014-04-30 MED ORDER — SODIUM CHLORIDE 0.9 % IJ SOLN
10.0000 mL | INTRAMUSCULAR | Status: DC | PRN
  Administered 2014-04-30: 10 mL via INTRAVENOUS
  Filled 2014-04-30: qty 10

## 2014-04-30 MED ORDER — FLUCONAZOLE 100 MG PO TABS: 100.0000 mg | ORAL_TABLET | Freq: Every day | ORAL | Status: DC

## 2014-04-30 MED ORDER — ZOLPIDEM TARTRATE 5 MG PO TABS: 5.0000 mg | ORAL_TABLET | Freq: Every evening | ORAL | Status: DC | PRN

## 2014-04-30 MED ORDER — SODIUM CHLORIDE 0.9 % IV SOLN
Freq: Once | INTRAVENOUS | Status: AC
  Administered 2014-04-30: 11:00:00 via INTRAVENOUS

## 2014-04-30 MED ORDER — SODIUM CHLORIDE 0.9 % IV SOLN
6.0000 mg/kg | Freq: Once | INTRAVENOUS | Status: AC
  Administered 2014-04-30: 525 mg via INTRAVENOUS
  Filled 2014-04-30: qty 25

## 2014-04-30 MED ORDER — DOCETAXEL CHEMO INJECTION 160 MG/16ML
75.0000 mg/m2 | Freq: Once | INTRAVENOUS | Status: AC
  Administered 2014-04-30: 150 mg via INTRAVENOUS
  Filled 2014-04-30: qty 15

## 2014-04-30 MED ORDER — HEPARIN SOD (PORK) LOCK FLUSH 100 UNIT/ML IV SOLN
500.0000 [IU] | Freq: Once | INTRAVENOUS | Status: AC | PRN
  Administered 2014-04-30: 500 [IU]
  Filled 2014-04-30: qty 5

## 2014-04-30 MED ORDER — DIPHENHYDRAMINE HCL 25 MG PO CAPS
ORAL_CAPSULE | ORAL | Status: AC
  Filled 2014-04-30: qty 1

## 2014-04-30 MED ORDER — SODIUM CHLORIDE 0.9 % IJ SOLN
10.0000 mL | INTRAMUSCULAR | Status: DC | PRN
  Administered 2014-04-30: 10 mL
  Filled 2014-04-30: qty 10

## 2014-04-30 MED ORDER — SODIUM CHLORIDE 0.9 % IV SOLN
720.0000 mg | Freq: Once | INTRAVENOUS | Status: AC
  Administered 2014-04-30: 720 mg via INTRAVENOUS
  Filled 2014-04-30: qty 72

## 2014-04-30 MED ORDER — LORAZEPAM 0.5 MG PO TABS: 0.5000 mg | ORAL_TABLET | Freq: Every evening | ORAL | Status: DC | PRN

## 2014-04-30 MED ORDER — ACETAMINOPHEN 325 MG PO TABS
ORAL_TABLET | ORAL | Status: AC
  Filled 2014-04-30: qty 2

## 2014-04-30 NOTE — Telephone Encounter (Signed)
Called pt with change in Neulasta appt 1/14 at 2:45. She voiced understanding.

## 2014-04-30 NOTE — Progress Notes (Signed)
[place]  [phone]) [phone] Fax:([phone]) [phone]     ID: Jessica [name] DOB: 05/18/1965  MR#: [id number]  JOI#:[id number]  Patient Care Team: Jessica Simmer, MD as PCP - General (Nurse Practitioner) Jessica Bookbinder, MD as Consulting Physician (General Surgery) Jessica Cruel, MD as Consulting Physician (Oncology) Jessica Gross, MD as Consulting Physician (Radiation Oncology)  CHIEF COMPLAINT: Newly diagnosed triple positive breast cancer CURRENT TREATMENT: neoadjuvant chemo-immunotherapy  BREAST CANCER HISTORY: From the original intake note:  Jessica [name] herself palpated a mass in her left breast. She brought it to the attention of Jessica [name] at the health department and she was set up for unilateral left mammography and ultrasound 11/27/2013 at the [facility]. This showed a possible distortion in the left breast upper outer quadrant. However the patient's breasts are density category D. On exam there was a firm nodule or mass in the left breast 2:00 location which by ultrasound was irregular and hypoechoic and measured 1.5 cm. There was no left axillary lymphadenopathy noted.  Biopsy of the mass in question 11/29/2013 showed ([id number]) an invasive ductal carcinoma, grade 1, estrogen receptor 94% positive, progesterone receptor 94% positive, both with strong staining intensity, with an MIB-1 of 46%, and HER-2 amplified by CISH, the signals ratio of 4.85 and the number per cell 6.55.  : 12/08/2013 the patient underwent bilateral breast MRI. This showed in the left breast upper outer quadrant a 2.9 cm irregular enhancing mass and extending posteriorly from this an area of clumped non-masslike enhancement extending approximately 6 cm and leading to a posterior group of nodules which were small but measured in aggregate 2.2 cm. In addition, there was a separate 0.8 cm irregular spiculated enhancing mass in the upper inner aspect of the left breast. In the  lower outer left breast there was a 0.8 cm oval enhancing mass. There were no abnormal appearing lymph nodes.  [name] subsequent history is as detailed below  INTERVAL HISTORY: Jessica [name] returns today for follow up of her breast cancer, accompanied by her husband, Jessica [name]. Today is her 6th and final planned cycles of carboplatin, docetaxel, pertuzumab, and trastuzumab to be given every 3 weeks.   REVIEW OF SYSTEMS: Jessica [name] denies fevers, chills, nausea, or vomiting. She had not had a bowel movement for 5 days until yesterday. She is using stool softeners, but has magnesium citrate on hand just in case. She has no mouth sore or rashes. She has begun to take fluconazole along with the dexamethasone to prevent thrush. Her appetite is healthy and she is eating well. The hydrocodone Dr. Jana Hakim prescribed for her back is very helpful. Her energy level is up and down, but she is not sleeping at all. She has tried benadryl, lorazepam, melatonin, and 147m trazodone, and none of these are effective.  She denies shortness of breath, chest pain, cough, or palpitations. She has hot flashes daily. She continues to have left lower abdominal pain. She is anxious but not depressed. A detailed review of systems is otherwise negative.    PAST MEDICAL HISTORY: Past Medical History  Diagnosis Date  . Anxiety   . COPD (chronic obstructive pulmonary disease) 2011    does not use inhaler ofter  . Depression   . Hot flashes   . Family history of anesthesia complication     Had a hard time waking father up only once after several procedures  . Shortness of breath     with exertion  . Heart murmur  PMH: at age 22; no longer have murmur  . Chronic heartburn   . PVC (premature ventricular contraction)     PMH;at 49 y.o.  . Cancer     DCIS  . Asthma     denies history of asthma    PAST SURGICAL HISTORY: Past Surgical History  Procedure Laterality Date  . Polyp removal     . Tonsillectomy    . Wisdom  tooth extraction    . Robotic assisted total hysterectomy with bilateral salpingo oopherectomy Bilateral 05/24/2013    Procedure: ROBOTIC ASSISTED TOTAL HYSTERECTOMY WITH BILATERAL SALPINGECTOMY;  Surgeon: Jessica Drafts, MD;  Location: [facility];  Service: Gynecology;  Laterality: Bilateral;  . Cystoscopy N/A 05/24/2013    Procedure: CYSTOSCOPY;  Surgeon: Jessica Drafts, MD;  Location: [facility];  Service: Gynecology;  Laterality: N/A;  . Abdominal hysterectomy    . Breast surgery      left breast biopsy x 2  . Portacath placement Right 01/02/2014    Procedure: INSERTION PORT-A-CATH;  Surgeon: Jessica Bookbinder, MD;  Location: [facility];  Service: General;  Laterality: Right;    FAMILY HISTORY Family History  Problem Relation Age of Onset  . Heart disease Mother   . Hypercholesterolemia Mother   . Heart disease Father   . COPD Father   . Hypercholesterolemia Father   . Cancer Neg Hx   [name] parents are still living, her father is 51 years old and her mother 14 years old as of August 2015. The patient had no brothers, one sister. There is no history of breast or ovarian cancer in the family.  GYNECOLOGIC HISTORY:  Patient's last menstrual period was 03/18/2013. Menarche age 88. She is GX P0. She took oral contraceptives for many years as well as Depo Provera shots. She is status post hysterectomy with bilateral salpingectomy. 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 She scans at work, and she also does all this she'll vein and stocking. She is single but for the last 11 years as lives with her significant other Jessica [name]. He is disabled secondary to multiple orthopedic problems including bilateral avascular necrosis of the hips. Both of them do smoke. The patient is here for drinks on weekends    ADVANCED DIRECTIVES: Not in place    HEALTH MAINTENANCE: History  Substance Use Topics  . Smoking status: Current Every [name]  Smoker -- 1.00 packs/[name] for 16 years    Types: Cigarettes  . Smokeless tobacco: Never Used  . Alcohol Use: Yes     Comment: occasional      Colonoscopy:  PAP: January 2015   Bone density:  Lipid panel:  No Known Allergies  Current Outpatient Prescriptions  Medication Sig Dispense Refill  . calcium carbonate (TUMS - DOSED IN MG ELEMENTAL CALCIUM) 500 MG chewable tablet Chew 1 tablet by mouth daily.    . cyclobenzaprine (FLEXERIL) 5 MG tablet Take 1 tablet (5 mg total) by mouth 2 (two) times daily as needed for muscle spasms. 60 tablet 1  . dexamethasone (DECADRON) 4 MG tablet Take 2 tablets (8 mg total) by mouth 2 (two) times daily. Start the [name] before Taxotere. Then again the [name] after chemo for 3 days. 30 tablet 1  . fluticasone (FLONASE) 50 MCG/ACT nasal spray Place 2 sprays into both nostrils daily. 16 g 2  . gabapentin (NEURONTIN) 300 MG capsule Take 1 capsule (300 mg total) by mouth 3 (three) times daily.  90 capsule 0  . HYDROcodone-acetaminophen (NORCO/VICODIN) 5-325 MG per tablet Take 1 tablet by mouth every 6 (six) hours as needed for moderate pain. 60 tablet 0  . ipratropium (ATROVENT HFA) 17 MCG/ACT inhaler Inhale 1 puff into the lungs every 6 (six) hours as needed for wheezing (SOB).     Marland Kitchen lidocaine-prilocaine (EMLA) cream Apply 1 application topically as needed. Apply over port site 1-2 hours before chemotherapy, cover with plastic wrap 30 g 0  . loratadine (CLARITIN) 10 MG tablet Take 1 tablet (10 mg total) by mouth as needed. 90 tablet 1  . omeprazole (PRILOSEC) 40 MG capsule Take 1 capsule (40 mg total) by mouth daily. 30 capsule 2  . ondansetron (ZOFRAN) 8 MG tablet Take 1 tablet (8 mg total) by mouth 2 (two) times daily. Start the [name] after chemo for 3 days. Then take as needed for nausea or vomiting. 30 tablet 1  . tobramycin-dexamethasone (TOBRADEX) ophthalmic solution Place 1 drop into both eyes 2 (two) times daily. 5 mL 0  . ANTI-DIARRHEAL 2 MG tablet   3  .  budesonide-formoterol (SYMBICORT) 160-4.5 MCG/ACT inhaler Inhale 1 puff into the lungs 2 (two) times daily.     . cholestyramine (QUESTRAN) 4 G packet Take 1 packet (4 g total) by mouth 3 (three) times daily. (Patient not taking: Reported on 03/25/2014) 60 each 3  . fluconazole (DIFLUCAN) 100 MG tablet Take 1 tablet (100 mg total) by mouth daily. 10 tablet 0  . hydrocortisone (ANUSOL-HC) 25 MG suppository Place 1 suppository (25 mg total) rectally 2 (two) times daily. (Patient not taking: Reported on 04/30/2014) 24 suppository 1  . hydrocortisone (PROCTOZONE-HC) 2.5 % rectal cream Place 1 application rectally 2 (two) times daily. (Patient not taking: Reported on 04/30/2014) 30 g 0  . ibuprofen (ADVIL,MOTRIN) 800 MG tablet Take 800 mg by mouth every 8 (eight) hours as needed (pain).    Marland Kitchen loperamide (IMODIUM) 2 MG capsule TAKE ONE TABLET AFTER EACH LOOSE STOOL. MAXIMUM OF SIX TABLETS A [name]. CHEMOTHERAPY INDUCED DIARRHEA. (Patient not taking: Reported on 03/18/2014) 60 capsule 3  . LORazepam (ATIVAN) 0.5 MG tablet Take 1 tablet (0.5 mg total) by mouth at bedtime as needed (Nausea or vomiting). 30 tablet 0  . nystatin (MYCOSTATIN) 100000 UNIT/ML suspension Take 5 mLs (500,000 Units total) by mouth 4 (four) times daily as needed (Swish and spit). (Patient not taking: Reported on 03/18/2014) 60 mL 0  . prochlorperazine (COMPAZINE) 10 MG tablet Take 1 tablet (10 mg total) by mouth every 6 (six) hours as needed (Nausea or vomiting). (Patient not taking: Reported on 03/25/2014) 30 tablet 1  . traZODone (DESYREL) 50 MG tablet Take 1 tablet (50 mg total) by mouth at bedtime as needed for sleep. (Patient not taking: Reported on 04/15/2014) 30 tablet 3  . zolpidem (AMBIEN) 5 MG tablet Take 1 tablet (5 mg total) by mouth at bedtime as needed for sleep. 30 tablet 0   No current facility-administered medications for this visit.   Facility-Administered Medications Ordered in Other Visits  Medication Dose Route Frequency  Provider Last Rate Last Dose  . CARBOplatin (PARAPLATIN) 720 mg in sodium chloride 0.9 % 250 mL chemo infusion  720 mg Intravenous Once Jessica Cruel, MD      . DOCEtaxel (TAXOTERE) 150 mg in dextrose 5 % 250 mL chemo infusion  75 mg/m2 (Treatment Plan Actual) Intravenous Once Jessica Cruel, MD      . heparin lock flush 100 unit/mL  500 Units Intracatheter  Once PRN Jessica Cruel, MD      . ondansetron (ZOFRAN) IVPB 16 mg  16 mg Intravenous Once Jessica Cruel, MD   16 mg at 04/30/14 1058  . pertuzumab (PERJETA) 420 mg in sodium chloride 0.9 % 250 mL chemo infusion  420 mg Intravenous Once Jessica Cruel, MD      . sodium chloride 0.9 % injection 10 mL  10 mL Intracatheter PRN Jessica Cruel, MD      . trastuzumab (HERCEPTIN) 525 mg in sodium chloride 0.9 % 250 mL chemo infusion  6 mg/kg (Treatment Plan Actual) Intravenous Once Jessica Cruel, MD        OBJECTIVE: middle-aged white woman who appears stated age 89 Vitals:   04/30/14 0922  BP: 132/84  Pulse: 100  Temp: 97.8 F (36.6 C)  Resp: 20     Body mass index is 34.93 kg/(m^2).    ECOG FS:1 - Symptomatic but completely ambulatory  Sclerae unicteric, pupils equal and reactive Oropharynx clear and moist-- no thrush No cervical or supraclavicular adenopathy Lungs no rales or rhonchi Heart regular rate and rhythm Abd soft, nontender, positive bowel sounds MSK no focal spinal tenderness, no upper extremity lymphedema Neuro: nonfocal, well oriented, appropriate affect Breasts: deferred  LAB RESULTS:  CMP     Component Value Date/Time   NA 142 04/30/2014 0903   NA 139 12/31/2013 1448   K 3.8 04/30/2014 0903   K 4.6 12/31/2013 1448   CL 103 12/31/2013 1448   CO2 24 04/30/2014 0903   CO2 24 12/31/2013 1448   GLUCOSE 140 04/30/2014 0903   GLUCOSE 93 12/31/2013 1448   BUN 10.0 04/30/2014 0903   BUN 9 12/31/2013 1448   CREATININE 0.8 04/30/2014 0903   CREATININE 0.83 12/31/2013 1448   CALCIUM 9.1  04/30/2014 0903   CALCIUM 8.8 12/31/2013 1448   PROT 6.3* 04/30/2014 0903   PROT 7.1 06/04/2013 1511   ALBUMIN 3.7 04/30/2014 0903   ALBUMIN 3.4* 06/04/2013 1511   AST 13 04/30/2014 0903   AST 12 06/04/2013 1511   ALT 17 04/30/2014 0903   ALT 14 06/04/2013 1511   ALKPHOS 66 04/30/2014 0903   ALKPHOS 70 06/04/2013 1511   BILITOT 0.52 04/30/2014 0903   BILITOT 0.5 06/04/2013 1511   GFRNONAA 83* 12/31/2013 1448   GFRAA >90 12/31/2013 1448    I No results found for: SPEP  Lab Results  Component Value Date   WBC 8.2 04/30/2014   NEUTROABS 7.7* 04/30/2014   HGB 11.0* 04/30/2014   HCT 33.1* 04/30/2014   MCV 105.4* 04/30/2014   PLT 121* 04/30/2014      Chemistry      Component Value Date/Time   NA 142 04/30/2014 0903   NA 139 12/31/2013 1448   K 3.8 04/30/2014 0903   K 4.6 12/31/2013 1448   CL 103 12/31/2013 1448   CO2 24 04/30/2014 0903   CO2 24 12/31/2013 1448   BUN 10.0 04/30/2014 0903   BUN 9 12/31/2013 1448   CREATININE 0.8 04/30/2014 0903   CREATININE 0.83 12/31/2013 1448      Component Value Date/Time   CALCIUM 9.1 04/30/2014 0903   CALCIUM 8.8 12/31/2013 1448   ALKPHOS 66 04/30/2014 0903   ALKPHOS 70 06/04/2013 1511   AST 13 04/30/2014 0903   AST 12 06/04/2013 1511   ALT 17 04/30/2014 0903   ALT 14 06/04/2013 1511   BILITOT 0.52 04/30/2014 0903   BILITOT 0.5 06/04/2013 1511  No results found for: LABCA2  No components found for: [id number]  No results for input(s): INR in the last 168 hours.  Urinalysis    Component Value Date/Time   COLORURINE YELLOW 06/04/2013 1420   APPEARANCEUR HAZY* 06/04/2013 1420   LABSPEC 1.030 01/27/2014 1350   LABSPEC >1.030* 06/04/2013 1420   PHURINE 6.0 06/04/2013 1420   GLUCOSEU Negative 01/27/2014 1350   GLUCOSEU NEGATIVE 06/04/2013 1420   HGBUR MODERATE* 06/04/2013 1420   HGBUR negative 10/07/2009 1044   BILIRUBINUR NEGATIVE 06/04/2013 1420   KETONESUR NEGATIVE 06/04/2013 1420   PROTEINUR NEGATIVE  06/04/2013 1420   UROBILINOGEN Color Interference 01/27/2014 1350   UROBILINOGEN 0.2 06/04/2013 1420   NITRITE NEGATIVE 06/04/2013 1420   LEUKOCYTESUR NEGATIVE 06/04/2013 1420    STUDIES: Overdue for repeat echocardiogram.  Mm Digital Diagnostic Unilat L  04/23/2014   CLINICAL DATA:  Known left breast malignancy. The patient is undergoing neoadjuvant chemotherapy. Evaluate response to the chemotherapy. [name] insurance will not allow follow-up evaluation by MRI.  EXAM: DIGITAL DIAGNOSTIC  left breast MAMMOGRAM WITH CAD  ULTRASOUND left BREAST  COMPARISON:  11/27/2013 and 10/21/2009.  ACR Breast Density Category d: The breast tissue is extremely dense, which lowers the sensitivity of mammography.  FINDINGS: There is distortion associated with the known mass located within the left breast at 2 o'clock position with a ribbon shaped clip present within the midst of the distortion. Due to the dense breast parenchymal pattern, the mass is not well evaluated by mammography.  Mammographic images were processed with CAD.  On physical exam, there is no discrete palpable mass within the upper-outer quadrant left breast.  Ultrasound is performed, showing no definite mass remains visible by ultrasound within the left breast at the 2 o'clock position 4 cm from the nipple consistent with improvement following neoadjuvant chemotherapy.  IMPRESSION: The mass is no longer visible by ultrasound consistent with significant improvement following neoadjuvant chemotherapy.  RECOMMENDATION: Treatment plan.  I have discussed the findings and recommendations with the patient. Results were also provided in writing at the conclusion of the visit. If applicable, a reminder letter will be sent to the patient regarding the next appointment.  BI-RADS CATEGORY  6: Known biopsy-proven malignancy.   Electronically Signed   By: Luberta Robertson M.D.   On: 04/23/2014 11:35   US Breast Ltd Uni Left Inc Axilla  04/23/2014   CLINICAL  DATA:  Known left breast malignancy. The patient is undergoing neoadjuvant chemotherapy. Evaluate response to the chemotherapy. [name] insurance will not allow follow-up evaluation by MRI.  EXAM: DIGITAL DIAGNOSTIC  left breast MAMMOGRAM WITH CAD  ULTRASOUND left BREAST  COMPARISON:  11/27/2013 and 10/21/2009.  ACR Breast Density Category d: The breast tissue is extremely dense, which lowers the sensitivity of mammography.  FINDINGS: There is distortion associated with the known mass located within the left breast at 2 o'clock position with a ribbon shaped clip present within the midst of the distortion. Due to the dense breast parenchymal pattern, the mass is not well evaluated by mammography.  Mammographic images were processed with CAD.  On physical exam, there is no discrete palpable mass within the upper-outer quadrant left breast.  Ultrasound is performed, showing no definite mass remains visible by ultrasound within the left breast at the 2 o'clock position 4 cm from the nipple consistent with improvement following neoadjuvant chemotherapy.  IMPRESSION: The mass is no longer visible by ultrasound consistent with significant improvement following neoadjuvant chemotherapy.  RECOMMENDATION: Treatment plan.  I  have discussed the findings and recommendations with the patient. Results were also provided in writing at the conclusion of the visit. If applicable, a reminder letter will be sent to the patient regarding the next appointment.  BI-RADS CATEGORY  6: Known biopsy-proven malignancy.   Electronically Signed   By: Luberta Robertson M.D.   On: 04/23/2014 11:35    ASSESSMENT: 49 y.o. BRCA negative [place] woman status post left breast upper outer quadrant biopsy 11/29/2013 for a clinical T2 N0, stage IIA invasive ductal carcinoma, grade 1, triple positive, with an MIB-1 of 46%.  (1) MRI-guided biopsy of two additional areas in the left breast showed only tubular adenomas, no malignancy.   (2)  neoadjuvant chemotherapy to start 01/14/2014, consisting of carboplatin, docetaxel, trastuzumab and pertuzumab given every 21 days for 6 cycles, with Neulasta on [name] 2-- echocardiogram 12/19/2013 showed an ejection fraction in the 50-55% range.  (3) trastuzumab to be continued to the total one year, through September of 2016  (4) definitive surgery to follow chemotherapy  (5) radiation to follow surgery  (6) antiestrogen therapy to follow radiation  PLAN: Angelyse is excited to finish her last round of chemo. The labs were reviewed in detail and were entirely stable. She will proceed with her 6th and final dose of TCH + pertuzumab. She is due for a repeat echocardiogram so I have placed orders for this to be performed within the next week.   Shandale and I reviewed the results of her repeat mammogram and ultrasound and the results were wonderful. The mass is no longer visible by ultrasound. Mayson has a visit with her surgeon approaching on the 19th of this month.  We have refilled her lorazepam today, which she uses PRN anxiety. We discussed her insomnia at length and I think this is partly due to steroid use, however she was a light sleeper before chemotherapy started. We will do a trial run of 37m ambien, with the warning that if daytime drowsiness, dizziness, or sleep walking occur we will have to discontinue this mediation.   I have ordered an abdominal xray to evaluate her left lower quadrant pain. She had a transvaginal ultrasound last year to rule out ovarian cysts which she has had a history of. Her insurance is "tricky" and she was unsure if they would approve of a CT scan, though the source of this pain could be diverticulitis.    LTaiwill return next week for labs and a nadir visit. She understands and agrees with this plan. She knows the goal of treatment in her case is cure. She has been encouraged to call with any issues that might arise before her next visit here.    FMarcelino Duster NP   04/30/2014 11:12 AM

## 2014-04-30 NOTE — Telephone Encounter (Signed)
per pof to sch pt appt-gave pt copy of sch-sent MW email to sch pt trmt-pt to get updated sch on 1/19 with hercetin sch

## 2014-04-30 NOTE — Patient Instructions (Signed)

## 2014-04-30 NOTE — Patient Instructions (Signed)
[facility] Discharge Instructions for Patients Receiving Chemotherapy  Today you received the following chemotherapy agents : Herceptin, Perjeta, Taxotere and carboplatin To help prevent nausea and vomiting after your treatment, we encourage you to take your nausea medication: compazine 10 mg every 6 hours as needed.   If you develop nausea and vomiting that is not controlled by your nausea medication, call the clinic.   BELOW ARE SYMPTOMS THAT SHOULD BE REPORTED IMMEDIATELY:  *FEVER GREATER THAN 100.5 F  *CHILLS WITH OR WITHOUT FEVER  NAUSEA AND VOMITING THAT IS NOT CONTROLLED WITH YOUR NAUSEA MEDICATION  *UNUSUAL SHORTNESS OF BREATH  *UNUSUAL BRUISING OR BLEEDING  TENDERNESS IN MOUTH AND THROAT WITH OR WITHOUT PRESENCE OF ULCERS  *URINARY PROBLEMS  *BOWEL PROBLEMS  UNUSUAL RASH Items with * indicate a potential emergency and should be followed up as soon as possible.  Feel free to call the clinic you have any questions or concerns. The clinic phone number is [phone].

## 2014-05-01 ENCOUNTER — Ambulatory Visit (HOSPITAL_COMMUNITY)
Admission: RE | Admit: 2014-05-01 | Discharge: 2014-05-01 | Disposition: A | Discharge status: Home / Self Care | Payer: Medicaid Other | Source: Ambulatory Visit | Attending: Nurse Practitioner | Admitting: Nurse Practitioner

## 2014-05-01 ENCOUNTER — Ambulatory Visit (HOSPITAL_BASED_OUTPATIENT_CLINIC_OR_DEPARTMENT_OTHER): Payer: Medicaid Other

## 2014-05-01 DIAGNOSIS — C50412 Malignant neoplasm of upper-outer quadrant of left female breast: Secondary | ICD-10-CM

## 2014-05-01 DIAGNOSIS — R197 Diarrhea, unspecified: Secondary | ICD-10-CM | POA: Insufficient documentation

## 2014-05-01 DIAGNOSIS — K5900 Constipation, unspecified: Secondary | ICD-10-CM | POA: Diagnosis not present

## 2014-05-01 DIAGNOSIS — R1030 Lower abdominal pain, unspecified: Secondary | ICD-10-CM | POA: Insufficient documentation

## 2014-05-01 DIAGNOSIS — C50919 Malignant neoplasm of unspecified site of unspecified female breast: Secondary | ICD-10-CM | POA: Diagnosis not present

## 2014-05-01 DIAGNOSIS — R1032 Left lower quadrant pain: Secondary | ICD-10-CM

## 2014-05-01 DIAGNOSIS — J449 Chronic obstructive pulmonary disease, unspecified: Secondary | ICD-10-CM | POA: Insufficient documentation

## 2014-05-01 DIAGNOSIS — Z5189 Encounter for other specified aftercare: Secondary | ICD-10-CM

## 2014-05-01 MED ORDER — PEGFILGRASTIM INJECTION 6 MG/0.6ML ~~LOC~~
6.0000 mg | PREFILLED_SYRINGE | Freq: Once | SUBCUTANEOUS | Status: AC
  Administered 2014-05-01: 6 mg via SUBCUTANEOUS
  Filled 2014-05-01: qty 0.6

## 2014-05-01 NOTE — Patient Instructions (Signed)
Pegfilgrastim injection What is this medicine? PEGFILGRASTIM (peg fil GRA stim) is a long-acting granulocyte colony-stimulating factor that stimulates the growth of neutrophils, a type of white blood cell important in the body's fight against infection. It is used to reduce the incidence of fever and infection in patients with certain types of cancer who are receiving chemotherapy that affects the bone marrow. This medicine may be used for other purposes; ask your health care provider or pharmacist if you have questions. COMMON BRAND NAME(S): Neulasta What should I tell my health care provider before I take this medicine? They need to know if you have any of these conditions: -latex allergy -ongoing radiation therapy -sickle cell disease -skin reactions to acrylic adhesives (On-Body Injector only) -an unusual or allergic reaction to pegfilgrastim, filgrastim, other medicines, foods, dyes, or preservatives -pregnant or trying to get pregnant -breast-feeding How should I use this medicine? This medicine is for injection under the skin. If you get this medicine at home, you will be taught how to prepare and give the pre-filled syringe or how to use the On-body Injector. Refer to the patient Instructions for Use for detailed instructions. Use exactly as directed. Take your medicine at regular intervals. Do not take your medicine more often than directed. It is important that you put your used needles and syringes in a special sharps container. Do not put them in a trash can. If you do not have a sharps container, call your pharmacist or healthcare provider to get one. Talk to your pediatrician regarding the use of this medicine in children. Special care may be needed. Overdosage: If you think you have taken too much of this medicine contact a poison control center or emergency room at once. NOTE: This medicine is only for you. Do not share this medicine with others. What if I miss a dose? It is  important not to miss your dose. Call your doctor or health care professional if you miss your dose. If you miss a dose due to an On-body Injector failure or leakage, a new dose should be administered as soon as possible using a single prefilled syringe for manual use. What may interact with this medicine? Interactions have not been studied. Give your health care provider a list of all the medicines, herbs, non-prescription drugs, or dietary supplements you use. Also tell them if you smoke, drink alcohol, or use illegal drugs. Some items may interact with your medicine. This list may not describe all possible interactions. Give your health care provider a list of all the medicines, herbs, non-prescription drugs, or dietary supplements you use. Also tell them if you smoke, drink alcohol, or use illegal drugs. Some items may interact with your medicine. What should I watch for while using this medicine? You may need blood work done while you are taking this medicine. If you are going to need a MRI, CT scan, or other procedure, tell your doctor that you are using this medicine (On-Body Injector only). What side effects may I notice from receiving this medicine? Side effects that you should report to your doctor or health care professional as soon as possible: -allergic reactions like skin rash, itching or hives, swelling of the face, lips, or tongue -dizziness -fever -pain, redness, or irritation at site where injected -pinpoint red spots on the skin -shortness of breath or breathing problems -stomach or side pain, or pain at the shoulder -swelling -tiredness -trouble passing urine Side effects that usually do not require medical attention (report to your doctor   or health care professional if they continue or are bothersome): -bone pain -muscle pain This list may not describe all possible side effects. Call your doctor for medical advice about side effects. You may report side effects to FDA at  1-800-FDA-1088. Where should I keep my medicine? Keep out of the reach of children. Store pre-filled syringes in a refrigerator between 2 and 8 degrees C (36 and 46 degrees F). Do not freeze. Keep in carton to protect from light. Throw away this medicine if it is left out of the refrigerator for more than 48 hours. Throw away any unused medicine after the expiration date. NOTE: This sheet is a summary. It may not cover all possible information. If you have questions about this medicine, talk to your doctor, pharmacist, or health care provider.  2015, Elsevier/Gold Standard. (2013-07-04 16:14:05)  

## 2014-05-02 ENCOUNTER — Other Ambulatory Visit (HOSPITAL_COMMUNITY): Payer: Self-pay | Admitting: Orthopedic Surgery

## 2014-05-02 ENCOUNTER — Telehealth: Payer: Self-pay | Admitting: Nurse Practitioner

## 2014-05-02 DIAGNOSIS — M5136 Other intervertebral disc degeneration, lumbar region: Secondary | ICD-10-CM

## 2014-05-02 NOTE — Telephone Encounter (Signed)
pt cld to give info ion ECHO-cld pt & gave ptdetail info on location and time-also gave pt [phone] to call for addtional info on location iof needed

## 2014-05-02 NOTE — Telephone Encounter (Signed)
per pof to sch pt ECHO-per Linda NPR-cld pt & left a message of appt time & date/location-pt understoo

## 2014-05-05 ENCOUNTER — Ambulatory Visit (HOSPITAL_COMMUNITY)
Admission: RE | Admit: 2014-05-05 | Discharge: 2014-05-05 | Disposition: A | Discharge status: Home / Self Care | Payer: Medicaid Other | Source: Ambulatory Visit | Attending: Nurse Practitioner | Admitting: Nurse Practitioner

## 2014-05-05 DIAGNOSIS — C50412 Malignant neoplasm of upper-outer quadrant of left female breast: Secondary | ICD-10-CM

## 2014-05-05 DIAGNOSIS — I517 Cardiomegaly: Secondary | ICD-10-CM

## 2014-05-05 NOTE — Progress Notes (Signed)
*  PRELIMINARY RESULTS* Echocardiogram 2D Echocardiogram has been performed.  Leavy Cella 05/05/2014, 10:48 AM

## 2014-05-06 ENCOUNTER — Other Ambulatory Visit (INDEPENDENT_AMBULATORY_CARE_PROVIDER_SITE_OTHER): Payer: Self-pay | Admitting: General Surgery

## 2014-05-06 ENCOUNTER — Other Ambulatory Visit: Payer: Medicaid Other

## 2014-05-06 ENCOUNTER — Ambulatory Visit: Payer: Medicaid Other

## 2014-05-06 ENCOUNTER — Ambulatory Visit: Payer: Medicaid Other | Admitting: Nurse Practitioner

## 2014-05-06 DIAGNOSIS — C50412 Malignant neoplasm of upper-outer quadrant of left female breast: Secondary | ICD-10-CM

## 2014-05-07 ENCOUNTER — Telehealth: Payer: Self-pay | Admitting: *Deleted

## 2014-05-07 ENCOUNTER — Other Ambulatory Visit: Payer: Self-pay | Admitting: General Surgery

## 2014-05-07 ENCOUNTER — Other Ambulatory Visit: Payer: Self-pay | Admitting: *Deleted

## 2014-05-07 ENCOUNTER — Telehealth: Payer: Self-pay | Admitting: Oncology

## 2014-05-07 DIAGNOSIS — C50412 Malignant neoplasm of upper-outer quadrant of left female breast: Secondary | ICD-10-CM

## 2014-05-07 DIAGNOSIS — C50912 Malignant neoplasm of unspecified site of left female breast: Secondary | ICD-10-CM

## 2014-05-07 NOTE — Telephone Encounter (Signed)
Pt called and left message re: pt missed f/u appt with Nira Conn, NP yesterday due to her significant other had a stroke.  Pt wanted to know what she should do.  Nira Conn, NP notified.  Spoke with pt and was informed that pt has mild sore throat today.  Asked if pt has fever; pt stated she did not think she has any fever.  Gave pt appts for lab and to see Nira Conn , NP  At  115pm lab, 145pm office visit on 05/08/14 as ok per NP.  Pt voiced understanding.  Onc tx sent to scheduler.

## 2014-05-07 NOTE — Telephone Encounter (Signed)
s.w pt and advised on 1.21 appt pt ok and aware

## 2014-05-08 ENCOUNTER — Other Ambulatory Visit (INDEPENDENT_AMBULATORY_CARE_PROVIDER_SITE_OTHER): Payer: Self-pay | Admitting: General Surgery

## 2014-05-08 ENCOUNTER — Ambulatory Visit (HOSPITAL_BASED_OUTPATIENT_CLINIC_OR_DEPARTMENT_OTHER): Payer: Medicaid Other | Admitting: Nurse Practitioner

## 2014-05-08 ENCOUNTER — Other Ambulatory Visit: Payer: Self-pay | Admitting: *Deleted

## 2014-05-08 ENCOUNTER — Encounter: Payer: Self-pay | Admitting: Nurse Practitioner

## 2014-05-08 ENCOUNTER — Other Ambulatory Visit (HOSPITAL_BASED_OUTPATIENT_CLINIC_OR_DEPARTMENT_OTHER): Payer: Medicaid Other

## 2014-05-08 ENCOUNTER — Telehealth: Payer: Self-pay | Admitting: Nurse Practitioner

## 2014-05-08 ENCOUNTER — Other Ambulatory Visit: Payer: Self-pay | Admitting: Nurse Practitioner

## 2014-05-08 VITALS — BP 132/71 | HR 110 | Temp 98.0°F | Resp 18 | Ht 65.0 in | Wt 201.6 lb

## 2014-05-08 DIAGNOSIS — IMO0001 Reserved for inherently not codable concepts without codable children: Secondary | ICD-10-CM

## 2014-05-08 DIAGNOSIS — C50412 Malignant neoplasm of upper-outer quadrant of left female breast: Secondary | ICD-10-CM

## 2014-05-08 DIAGNOSIS — C50411 Malignant neoplasm of upper-outer quadrant of right female breast: Secondary | ICD-10-CM

## 2014-05-08 DIAGNOSIS — K219 Gastro-esophageal reflux disease without esophagitis: Principal | ICD-10-CM

## 2014-05-08 DIAGNOSIS — Z170 Estrogen receptor positive status [ER+]: Secondary | ICD-10-CM

## 2014-05-08 DIAGNOSIS — F419 Anxiety disorder, unspecified: Secondary | ICD-10-CM

## 2014-05-08 DIAGNOSIS — R1032 Left lower quadrant pain: Secondary | ICD-10-CM

## 2014-05-08 LAB — CBC WITH DIFFERENTIAL/PLATELET
BASO%: 0.4 % (ref 0.0–2.0)
Basophils Absolute: 0 10*3/uL (ref 0.0–0.1)
EOS ABS: 0 10*3/uL (ref 0.0–0.5)
EOS%: 0.1 % (ref 0.0–7.0)
HCT: 35.3 % (ref 34.8–46.6)
HGB: 11.7 g/dL (ref 11.6–15.9)
LYMPH%: 15.4 % (ref 14.0–49.7)
MCH: 35.1 pg — AB (ref 25.1–34.0)
MCHC: 33 g/dL (ref 31.5–36.0)
MCV: 106.2 fL — AB (ref 79.5–101.0)
MONO#: 0.8 10*3/uL (ref 0.1–0.9)
MONO%: 7.9 % (ref 0.0–14.0)
NEUT#: 7.3 10*3/uL — ABNORMAL HIGH (ref 1.5–6.5)
NEUT%: 76.2 % (ref 38.4–76.8)
Platelets: 159 10*3/uL (ref 145–400)
RBC: 3.33 10*6/uL — AB (ref 3.70–5.45)
RDW: 17.8 % — AB (ref 11.2–14.5)
WBC: 9.6 10*3/uL (ref 3.9–10.3)
lymph#: 1.5 10*3/uL (ref 0.9–3.3)

## 2014-05-08 LAB — COMPREHENSIVE METABOLIC PANEL (CC13)
ALBUMIN: 3.9 g/dL (ref 3.5–5.0)
ALK PHOS: 87 U/L (ref 40–150)
ALT: 21 U/L (ref 0–55)
AST: 14 U/L (ref 5–34)
Anion Gap: 12 mEq/L — ABNORMAL HIGH (ref 3–11)
BUN: 6.7 mg/dL — AB (ref 7.0–26.0)
CALCIUM: 9 mg/dL (ref 8.4–10.4)
CHLORIDE: 104 meq/L (ref 98–109)
CO2: 23 mEq/L (ref 22–29)
Creatinine: 0.8 mg/dL (ref 0.6–1.1)
EGFR: 85 mL/min/{1.73_m2} — ABNORMAL LOW (ref >90)
Glucose: 118 mg/dl (ref 70–140)
Potassium: 3.8 mEq/L (ref 3.5–5.1)
Sodium: 139 mEq/L (ref 136–145)
Total Bilirubin: 0.34 mg/dL (ref 0.20–1.20)
Total Protein: 6.5 g/dL (ref 6.4–8.3)

## 2014-05-08 MED ORDER — OMEPRAZOLE 40 MG PO CPDR: 40.0000 mg | DELAYED_RELEASE_CAPSULE | Freq: Every day | ORAL | Status: DC

## 2014-05-08 MED ORDER — CYCLOBENZAPRINE HCL 5 MG PO TABS: 5.0000 mg | ORAL_TABLET | Freq: Two times a day (BID) | ORAL | Status: DC | PRN

## 2014-05-08 NOTE — Telephone Encounter (Signed)
per pof to sch pt appt-gave pt copy of sch °

## 2014-05-08 NOTE — Progress Notes (Signed)
[place]  Telephone:([phone]) [phone] Fax:(336) [phone]     ID: JAUNITA MIKELS DOB: [birth year]  MR#: [id number]  DJS#:[id number]  Patient Care Team: [name], MD as PCP - General (Nurse Practitioner) [name], MD as Consulting Physician (General Surgery) [name], MD as Consulting Physician (Oncology) Jodelle Gross, MD as Consulting Physician (Radiation Oncology)  CHIEF COMPLAINT: Newly diagnosed triple positive breast cancer CURRENT TREATMENT: neoadjuvant chemo-immunotherapy  BREAST CANCER HISTORY: From the original intake note:  Pa herself palpated a mass in her left breast. She brought it to the attention of [name] at the health department and she was set up for unilateral left mammography and ultrasound 11/27/2013 at the [facility]. This showed a possible distortion in the left breast upper outer quadrant. However the patient's breasts are density category D. On exam there was a firm nodule or mass in the left breast 2:00 location which by ultrasound was irregular and hypoechoic and measured 1.5 cm. There was no left axillary lymphadenopathy noted.  Biopsy of the mass in question 11/29/2013 showed ([id number]) an invasive ductal carcinoma, grade 1, estrogen receptor 94% positive, progesterone receptor 94% positive, both with strong staining intensity, with an MIB-1 of 46%, and HER-2 amplified by CISH, the signals ratio of 4.85 and the number per cell 6.55.  : 12/08/2013 the patient underwent bilateral breast MRI. This showed in the left breast upper outer quadrant a 2.9 cm irregular enhancing mass and extending posteriorly from this an area of clumped non-masslike enhancement extending approximately 6 cm and leading to a posterior group of nodules which were small but measured in aggregate 2.2 cm. In addition, there was a separate 0.8 cm irregular spiculated enhancing mass in the upper inner aspect of the left breast. In the  lower outer left breast there was a 0.8 cm oval enhancing mass. There were no abnormal appearing lymph nodes.  [name] subsequent history is as detailed below  INTERVAL HISTORY: Lucia returns today for follow up of her breast cancer, alone. Letia completed her 6th and final cycle of carboplatin, docetaxel, trastuzumab, and pertuzumab last week. She missed her appointment yesterday, because her husband had a stroke on Monday. He is in ICU and will be moved to rehab sometime this week. [name] has regained his speech, but is unable to move any of the right side of his body.   REVIEW OF SYSTEMS: Robin denies fevers, chills, nausea, or vomiting. She had previously been dealing with constipation, but has had runny stools for the past few days. She is using imodium cautiously to combat this. She has no mouth sore or rashes but her thrush has returned because she forgot to use her diflucan with the dexamethasone last week. Her appetite has decreased somewhat so for now she is just snacking. Sleep is still an issue, especially now that she has concerns for [name] and his health.  She denies shortness of breath, chest pain, cough, or palpitations. She has hot flashes daily. She continues to have left lower abdominal pain, but an abdominal xray was negative for abnormalities on 1/14. She is anxious but not depressed. A detailed review of systems is otherwise negative.    PAST MEDICAL HISTORY: Past Medical History  Diagnosis Date  . Anxiety   . COPD (chronic obstructive pulmonary disease) 2011    does not use inhaler ofter  . Depression   . Hot flashes   . Family history of anesthesia complication     Had a hard  time waking father up only once after several procedures  . Shortness of breath     with exertion  . Heart murmur     PMH: at age 72; no longer have murmur  . Chronic heartburn   . PVC (premature ventricular contraction)     PMH;at 49 y.o.  . Cancer     DCIS  . Asthma     denies history  of asthma    PAST SURGICAL HISTORY: Past Surgical History  Procedure Laterality Date  . Polyp removal     . Tonsillectomy    . Wisdom tooth extraction    . Robotic assisted total hysterectomy with bilateral salpingo oopherectomy Bilateral 05/24/2013    Procedure: ROBOTIC ASSISTED TOTAL HYSTERECTOMY WITH BILATERAL SALPINGECTOMY;  Surgeon: Lavonia Drafts, MD;  Location: [facility];  Service: Gynecology;  Laterality: Bilateral;  . Cystoscopy N/A 05/24/2013    Procedure: CYSTOSCOPY;  Surgeon: Lavonia Drafts, MD;  Location: [facility];  Service: Gynecology;  Laterality: N/A;  . Abdominal hysterectomy    . Breast surgery      left breast biopsy x 2  . Portacath placement Right 01/02/2014    Procedure: INSERTION PORT-A-CATH;  Surgeon: [name], MD;  Location: [facility];  Service: General;  Laterality: Right;    FAMILY HISTORY Family History  Problem Relation Age of Onset  . Heart disease Mother   . Hypercholesterolemia Mother   . Heart disease Father   . COPD Father   . Hypercholesterolemia Father   . Cancer Neg Hx   [name] parents are still living, her father is 24 years old and her mother 67 years old as of August 2015. The patient had no brothers, one sister. There is no history of breast or ovarian cancer in the family.  GYNECOLOGIC HISTORY:  Patient's last menstrual period was 03/18/2013. Menarche age 57. She is GX P0. She took oral contraceptives for many years as well as Depo Provera shots. She is status post hysterectomy with bilateral salpingectomy. 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 She scans at work, and she also does all this she'll vein and stocking. She is single but for the last 11 years as lives with her significant other [name]. He is disabled secondary to multiple orthopedic problems including bilateral avascular necrosis of the hips. Both of them do smoke. The patient is here for drinks on  weekends    ADVANCED DIRECTIVES: Not in place    HEALTH MAINTENANCE: History  Substance Use Topics  . Smoking status: Current Every Day Smoker -- 1.00 packs/day for 16 years    Types: Cigarettes  . Smokeless tobacco: Never Used  . Alcohol Use: Yes     Comment: occasional      Colonoscopy:  PAP: January 2015   Bone density:  Lipid panel:  No Known Allergies  Current Outpatient Prescriptions  Medication Sig Dispense Refill  . calcium carbonate (TUMS - DOSED IN MG ELEMENTAL CALCIUM) 500 MG chewable tablet Chew 1 tablet by mouth daily.    . cyclobenzaprine (FLEXERIL) 5 MG tablet Take 1 tablet (5 mg total) by mouth 2 (two) times daily as needed for muscle spasms. 60 tablet 1  . fluconazole (DIFLUCAN) 100 MG tablet Take 1 tablet (100 mg total) by mouth daily. 10 tablet 0  . gabapentin (NEURONTIN) 300 MG capsule Take 1 capsule (300 mg total) by mouth 3 (three) times daily. 90 capsule 0  . HYDROcodone-acetaminophen (NORCO/VICODIN)  5-325 MG per tablet Take 1 tablet by mouth every 6 (six) hours as needed for moderate pain. 60 tablet 0  . hydrocortisone (PROCTOZONE-HC) 2.5 % rectal cream Place 1 application rectally 2 (two) times daily. 30 g 0  . ibuprofen (ADVIL,MOTRIN) 800 MG tablet Take 800 mg by mouth every 8 (eight) hours as needed (pain).    Marland Kitchen loperamide (IMODIUM) 2 MG capsule TAKE ONE TABLET AFTER EACH LOOSE STOOL. MAXIMUM OF SIX TABLETS A DAY. CHEMOTHERAPY INDUCED DIARRHEA. 60 capsule 3  . loratadine (CLARITIN) 10 MG tablet Take 1 tablet (10 mg total) by mouth as needed. 90 tablet 1  . LORazepam (ATIVAN) 0.5 MG tablet Take 1 tablet (0.5 mg total) by mouth at bedtime as needed (Nausea or vomiting). 30 tablet 0  . omeprazole (PRILOSEC) 40 MG capsule Take 1 capsule (40 mg total) by mouth daily. 30 capsule 2  . zolpidem (AMBIEN) 5 MG tablet Take 1 tablet (5 mg total) by mouth at bedtime as needed for sleep. 30 tablet 0  . budesonide-formoterol (SYMBICORT) 160-4.5 MCG/ACT inhaler Inhale 1  puff into the lungs 2 (two) times daily.     . cholestyramine (QUESTRAN) 4 G packet Take 1 packet (4 g total) by mouth 3 (three) times daily. (Patient not taking: Reported on 03/25/2014) 60 each 3  . fluticasone (FLONASE) 50 MCG/ACT nasal spray Place 2 sprays into both nostrils daily. (Patient not taking: Reported on 05/08/2014) 16 g 2  . hydrocortisone (ANUSOL-HC) 25 MG suppository Place 1 suppository (25 mg total) rectally 2 (two) times daily. (Patient not taking: Reported on 04/30/2014) 24 suppository 1  . ipratropium (ATROVENT HFA) 17 MCG/ACT inhaler Inhale 1 puff into the lungs every 6 (six) hours as needed for wheezing (SOB).     Marland Kitchen lidocaine-prilocaine (EMLA) cream Apply 1 application topically as needed. Apply over port site 1-2 hours before chemotherapy, cover with plastic wrap (Patient not taking: Reported on 05/08/2014) 30 g 0  . nystatin (MYCOSTATIN) 100000 UNIT/ML suspension Take 5 mLs (500,000 Units total) by mouth 4 (four) times daily as needed (Swish and spit). (Patient not taking: Reported on 03/18/2014) 60 mL 0  . tobramycin-dexamethasone (TOBRADEX) ophthalmic solution Place 1 drop into both eyes 2 (two) times daily. (Patient not taking: Reported on 05/08/2014) 5 mL 0  . traZODone (DESYREL) 50 MG tablet Take 1 tablet (50 mg total) by mouth at bedtime as needed for sleep. (Patient not taking: Reported on 04/15/2014) 30 tablet 3   No current facility-administered medications for this visit.    OBJECTIVE: middle-aged white woman who appears stated age 14 Vitals:   05/08/14 1342  BP: 132/71  Pulse: 110  Temp: 98 F (36.7 C)  Resp: 18     Body mass index is 33.55 kg/(m^2).    ECOG FS:1 - Symptomatic but completely ambulatory  Skin: warm, dry  HEENT: sclerae anicteric, conjunctivae pink, oropharynx clear. No thrush or mucositis.  Lymph Nodes: No cervical or supraclavicular lymphadenopathy  Lungs: clear to auscultation bilaterally, no rales, wheezes, or rhonci  Heart: regular rate  and rhythm  Abdomen: round, soft, non tender, positive bowel sounds  Musculoskeletal: No focal spinal tenderness, no peripheral edema  Neuro: non focal, well oriented, positive affect  Breasts: deferred   LAB RESULTS:  CMP     Component Value Date/Time   NA 139 05/08/2014 1316   NA 139 12/31/2013 1448   K 3.8 05/08/2014 1316   K 4.6 12/31/2013 1448   CL 103 12/31/2013 1448   CO2 23  05/08/2014 1316   CO2 24 12/31/2013 1448   GLUCOSE 118 05/08/2014 1316   GLUCOSE 93 12/31/2013 1448   BUN 6.7* 05/08/2014 1316   BUN 9 12/31/2013 1448   CREATININE 0.8 05/08/2014 1316   CREATININE 0.83 12/31/2013 1448   CALCIUM 9.0 05/08/2014 1316   CALCIUM 8.8 12/31/2013 1448   PROT 6.5 05/08/2014 1316   PROT 7.1 06/04/2013 1511   ALBUMIN 3.9 05/08/2014 1316   ALBUMIN 3.4* 06/04/2013 1511   AST 14 05/08/2014 1316   AST 12 06/04/2013 1511   ALT 21 05/08/2014 1316   ALT 14 06/04/2013 1511   ALKPHOS 87 05/08/2014 1316   ALKPHOS 70 06/04/2013 1511   BILITOT 0.34 05/08/2014 1316   BILITOT 0.5 06/04/2013 1511   GFRNONAA 83* 12/31/2013 1448   GFRAA >90 12/31/2013 1448    I No results found for: SPEP  Lab Results  Component Value Date   WBC 9.6 05/08/2014   NEUTROABS 7.3* 05/08/2014   HGB 11.7 05/08/2014   HCT 35.3 05/08/2014   MCV 106.2* 05/08/2014   PLT 159 05/08/2014      Chemistry      Component Value Date/Time   NA 139 05/08/2014 1316   NA 139 12/31/2013 1448   K 3.8 05/08/2014 1316   K 4.6 12/31/2013 1448   CL 103 12/31/2013 1448   CO2 23 05/08/2014 1316   CO2 24 12/31/2013 1448   BUN 6.7* 05/08/2014 1316   BUN 9 12/31/2013 1448   CREATININE 0.8 05/08/2014 1316   CREATININE 0.83 12/31/2013 1448      Component Value Date/Time   CALCIUM 9.0 05/08/2014 1316   CALCIUM 8.8 12/31/2013 1448   ALKPHOS 87 05/08/2014 1316   ALKPHOS 70 06/04/2013 1511   AST 14 05/08/2014 1316   AST 12 06/04/2013 1511   ALT 21 05/08/2014 1316   ALT 14 06/04/2013 1511   BILITOT 0.34  05/08/2014 1316   BILITOT 0.5 06/04/2013 1511       No results found for: LABCA2  No components found for: LABCA125  No results for input(s): INR in the last 168 hours.  Urinalysis    Component Value Date/Time   COLORURINE YELLOW 06/04/2013 1420   APPEARANCEUR HAZY* 06/04/2013 1420   LABSPEC 1.030 01/27/2014 1350   LABSPEC >1.030* 06/04/2013 1420   PHURINE 6.0 06/04/2013 1420   GLUCOSEU Negative 01/27/2014 1350   GLUCOSEU NEGATIVE 06/04/2013 1420   HGBUR MODERATE* 06/04/2013 1420   HGBUR negative 10/07/2009 [place] 06/04/2013 1420   KETONESUR NEGATIVE 06/04/2013 1420   PROTEINUR NEGATIVE 06/04/2013 1420   UROBILINOGEN Color Interference 01/27/2014 1350   UROBILINOGEN 0.2 06/04/2013 1420   NITRITE NEGATIVE 06/04/2013 1420   LEUKOCYTESUR NEGATIVE 06/04/2013 1420    STUDIES: Most recent echocardiogram on 05/05/14 shows an EF 55-60%  Dg Abd 2 Views  05/01/2014   CLINICAL DATA:  LEFT-sided lower abdominal pain for 3 months, history breast cancer on chemotherapy, LEFT lower abdominal tenderness, constipation and diarrhea, COPD  EXAM: ABDOMEN - 2 VIEW  COMPARISON:  None; correlation CT abdomen and pelvis 06/04/2013  FINDINGS: Scattered stool throughout colon.  No bowel dilatation, bowel wall thickening or evidence of free air.  Lung bases clear.  No acute osseous findings or urinary tract calcifications.  IMPRESSION: No acute abnormalities.   Electronically Signed   By: Lavonia Dana M.D.   On: 05/01/2014 20:04   Mm Digital Diagnostic Unilat L  04/23/2014   CLINICAL DATA:  Known left breast malignancy. The  patient is undergoing neoadjuvant chemotherapy. Evaluate response to the chemotherapy. [name] insurance will not allow follow-up evaluation by MRI.  EXAM: DIGITAL DIAGNOSTIC  left breast MAMMOGRAM WITH CAD  ULTRASOUND left BREAST  COMPARISON:  11/27/2013 and 10/21/2009.  ACR Breast Density Category d: The breast tissue is extremely dense, which lowers the  sensitivity of mammography.  FINDINGS: There is distortion associated with the known mass located within the left breast at 2 o'clock position with a ribbon shaped clip present within the midst of the distortion. Due to the dense breast parenchymal pattern, the mass is not well evaluated by mammography.  Mammographic images were processed with CAD.  On physical exam, there is no discrete palpable mass within the upper-outer quadrant left breast.  Ultrasound is performed, showing no definite mass remains visible by ultrasound within the left breast at the 2 o'clock position 4 cm from the nipple consistent with improvement following neoadjuvant chemotherapy.  IMPRESSION: The mass is no longer visible by ultrasound consistent with significant improvement following neoadjuvant chemotherapy.  RECOMMENDATION: Treatment plan.  I have discussed the findings and recommendations with the patient. Results were also provided in writing at the conclusion of the visit. If applicable, a reminder letter will be sent to the patient regarding the next appointment.  BI-RADS CATEGORY  6: Known biopsy-proven malignancy.   Electronically Signed   By: Luberta Robertson M.D.   On: 04/23/2014 11:35   US Breast Ltd Uni Left Inc Axilla  04/23/2014   CLINICAL DATA:  Known left breast malignancy. The patient is undergoing neoadjuvant chemotherapy. Evaluate response to the chemotherapy. [name] insurance will not allow follow-up evaluation by MRI.  EXAM: DIGITAL DIAGNOSTIC  left breast MAMMOGRAM WITH CAD  ULTRASOUND left BREAST  COMPARISON:  11/27/2013 and 10/21/2009.  ACR Breast Density Category d: The breast tissue is extremely dense, which lowers the sensitivity of mammography.  FINDINGS: There is distortion associated with the known mass located within the left breast at 2 o'clock position with a ribbon shaped clip present within the midst of the distortion. Due to the dense breast parenchymal pattern, the mass is not well evaluated by  mammography.  Mammographic images were processed with CAD.  On physical exam, there is no discrete palpable mass within the upper-outer quadrant left breast.  Ultrasound is performed, showing no definite mass remains visible by ultrasound within the left breast at the 2 o'clock position 4 cm from the nipple consistent with improvement following neoadjuvant chemotherapy.  IMPRESSION: The mass is no longer visible by ultrasound consistent with significant improvement following neoadjuvant chemotherapy.  RECOMMENDATION: Treatment plan.  I have discussed the findings and recommendations with the patient. Results were also provided in writing at the conclusion of the visit. If applicable, a reminder letter will be sent to the patient regarding the next appointment.  BI-RADS CATEGORY  6: Known biopsy-proven malignancy.   Electronically Signed   By: Luberta Robertson M.D.   On: 04/23/2014 11:35    ASSESSMENT: 49 y.o. BRCA negative [place] woman status post left breast upper outer quadrant biopsy 11/29/2013 for a clinical T2 N0, stage IIA invasive ductal carcinoma, grade 1, triple positive, with an MIB-1 of 46%.  (1) MRI-guided biopsy of two additional areas in the left breast showed only tubular adenomas, no malignancy.   (2) neoadjuvant chemotherapy started 01/14/2014, consisting of carboplatin, docetaxel, trastuzumab and pertuzumab given every 21 days for 6 cycles, with Neulasta on day 2. Completed 04/30/14  (3) trastuzumab to be continued to the total  one year, through September of 2016  (4) definitive surgery to follow chemotherapy   (5) radiation to follow surgery  (6) antiestrogen therapy to follow radiation  PLAN: Mildreth has lots on mind with her husband recent illness. She is excited to be done with her chemo though. The labs were reviewed in detail and were entirely stable.  Malonie is scheduled for a lumpectomy on 2/11. She will have radiation after surgery. She will continue to receive  trastuzumab every 3 weeks. She had a repeat echocardiogram this Monday that showed a well preserved ejection fraction. Palmina will return for labs and her next office visit in late February. She understands and agrees with this plan. She knows the goal of treatment in her case is cure. She has been encouraged to call with any issues that might arise before her next visit here.    Marcelino Duster, NP   05/08/2014 2:25 PM

## 2014-05-12 ENCOUNTER — Telehealth: Payer: Self-pay | Admitting: Oncology

## 2014-05-12 NOTE — Telephone Encounter (Signed)
Due to [facility] appt moved to PM. left message for pt re change with new appt time. pt to get new schedule at next appt 2/2.

## 2014-05-15 ENCOUNTER — Other Ambulatory Visit: Payer: Self-pay | Admitting: *Deleted

## 2014-05-15 NOTE — Telephone Encounter (Signed)
Called pt about refill on Lorazepam. Pt said she has no printed Rx  and [facility] said pt has not  presented with any Rx for this medication to the pharmacy. I did call in Rx to pharmacy for Lorazepam, Disp- 30, R-0. Pt will pick up tomorrow.

## 2014-05-16 ENCOUNTER — Encounter (HOSPITAL_COMMUNITY)
Admission: RE | Admit: 2014-05-16 | Discharge: 2014-05-16 | Disposition: A | Discharge status: Home / Self Care | Payer: Medicaid Other | Source: Ambulatory Visit | Attending: Orthopedic Surgery | Admitting: Orthopedic Surgery

## 2014-05-16 ENCOUNTER — Other Ambulatory Visit (HOSPITAL_COMMUNITY): Payer: Self-pay | Admitting: Orthopedic Surgery

## 2014-05-16 DIAGNOSIS — M5136 Other intervertebral disc degeneration, lumbar region: Secondary | ICD-10-CM

## 2014-05-16 DIAGNOSIS — C50912 Malignant neoplasm of unspecified site of left female breast: Secondary | ICD-10-CM

## 2014-05-16 MED ORDER — TECHNETIUM TC 99M MEDRONATE IV KIT
26.4000 | PACK | Freq: Once | INTRAVENOUS | Status: AC | PRN
  Administered 2014-05-16: 26.4 via INTRAVENOUS

## 2014-05-19 ENCOUNTER — Other Ambulatory Visit: Payer: Self-pay | Admitting: Nurse Practitioner

## 2014-05-20 ENCOUNTER — Other Ambulatory Visit: Payer: Self-pay | Admitting: Hematology and Oncology

## 2014-05-20 ENCOUNTER — Ambulatory Visit: Payer: Medicaid Other

## 2014-05-20 ENCOUNTER — Other Ambulatory Visit: Payer: Self-pay | Admitting: Nurse Practitioner

## 2014-05-20 ENCOUNTER — Ambulatory Visit (HOSPITAL_BASED_OUTPATIENT_CLINIC_OR_DEPARTMENT_OTHER): Payer: Medicaid Other

## 2014-05-20 ENCOUNTER — Other Ambulatory Visit (HOSPITAL_BASED_OUTPATIENT_CLINIC_OR_DEPARTMENT_OTHER): Payer: Medicaid Other

## 2014-05-20 ENCOUNTER — Other Ambulatory Visit: Payer: Medicaid Other

## 2014-05-20 DIAGNOSIS — C50412 Malignant neoplasm of upper-outer quadrant of left female breast: Secondary | ICD-10-CM

## 2014-05-20 DIAGNOSIS — Z95828 Presence of other vascular implants and grafts: Secondary | ICD-10-CM

## 2014-05-20 DIAGNOSIS — Z5112 Encounter for antineoplastic immunotherapy: Secondary | ICD-10-CM

## 2014-05-20 LAB — COMPREHENSIVE METABOLIC PANEL (CC13)
ALT: 22 U/L (ref 0–55)
AST: 18 U/L (ref 5–34)
Albumin: 3.5 g/dL (ref 3.5–5.0)
Alkaline Phosphatase: 59 U/L (ref 40–150)
Anion Gap: 11 mEq/L (ref 3–11)
BILIRUBIN TOTAL: 0.65 mg/dL (ref 0.20–1.20)
BUN: 8.1 mg/dL (ref 7.0–26.0)
CO2: 27 meq/L (ref 22–29)
CREATININE: 0.8 mg/dL (ref 0.6–1.1)
Calcium: 9.1 mg/dL (ref 8.4–10.4)
Chloride: 105 mEq/L (ref 98–109)
EGFR: 84 mL/min/{1.73_m2} — ABNORMAL LOW (ref >90)
Glucose: 106 mg/dl (ref 70–140)
Potassium: 3.7 mEq/L (ref 3.5–5.1)
Sodium: 143 mEq/L (ref 136–145)
Total Protein: 5.8 g/dL — ABNORMAL LOW (ref 6.4–8.3)

## 2014-05-20 LAB — CBC WITH DIFFERENTIAL/PLATELET
BASO%: 0.3 % (ref 0.0–2.0)
BASOS ABS: 0 10*3/uL (ref 0.0–0.1)
EOS%: 0.2 % (ref 0.0–7.0)
Eosinophils Absolute: 0 10*3/uL (ref 0.0–0.5)
HEMATOCRIT: 31.8 % — AB (ref 34.8–46.6)
HEMOGLOBIN: 10.6 g/dL — AB (ref 11.6–15.9)
LYMPH%: 19 % (ref 14.0–49.7)
MCH: 34.9 pg — ABNORMAL HIGH (ref 25.1–34.0)
MCHC: 33.3 g/dL (ref 31.5–36.0)
MCV: 105 fL — ABNORMAL HIGH (ref 79.5–101.0)
MONO#: 0.3 10*3/uL (ref 0.1–0.9)
MONO%: 6.9 % (ref 0.0–14.0)
NEUT#: 3.2 10*3/uL (ref 1.5–6.5)
NEUT%: 73.6 % (ref 38.4–76.8)
PLATELETS: 90 10*3/uL — AB (ref 145–400)
RBC: 3.03 10*6/uL — ABNORMAL LOW (ref 3.70–5.45)
RDW: 17.5 % — AB (ref 11.2–14.5)
WBC: 4.4 10*3/uL (ref 3.9–10.3)
lymph#: 0.8 10*3/uL — ABNORMAL LOW (ref 0.9–3.3)

## 2014-05-20 MED ORDER — DIPHENHYDRAMINE HCL 25 MG PO CAPS
50.0000 mg | ORAL_CAPSULE | Freq: Once | ORAL | Status: AC
  Administered 2014-05-20: 50 mg via ORAL

## 2014-05-20 MED ORDER — SODIUM CHLORIDE 0.9 % IJ SOLN
10.0000 mL | INTRAMUSCULAR | Status: DC | PRN
  Administered 2014-05-20: 10 mL via INTRAVENOUS
  Filled 2014-05-20: qty 10

## 2014-05-20 MED ORDER — DIPHENHYDRAMINE HCL 25 MG PO CAPS
ORAL_CAPSULE | ORAL | Status: AC
  Filled 2014-05-20: qty 2

## 2014-05-20 MED ORDER — SODIUM CHLORIDE 0.9 % IV SOLN
Freq: Once | INTRAVENOUS | Status: AC
  Administered 2014-05-20: 10:00:00 via INTRAVENOUS

## 2014-05-20 MED ORDER — ACETAMINOPHEN 325 MG PO TABS
ORAL_TABLET | ORAL | Status: AC
  Filled 2014-05-20: qty 2

## 2014-05-20 MED ORDER — HEPARIN SOD (PORK) LOCK FLUSH 100 UNIT/ML IV SOLN
500.0000 [IU] | Freq: Once | INTRAVENOUS | Status: AC | PRN
  Administered 2014-05-20: 500 [IU]
  Filled 2014-05-20: qty 5

## 2014-05-20 MED ORDER — SODIUM CHLORIDE 0.9 % IV SOLN
525.0000 mg | Freq: Once | INTRAVENOUS | Status: AC
  Administered 2014-05-20: 525 mg via INTRAVENOUS
  Filled 2014-05-20: qty 25

## 2014-05-20 MED ORDER — ACETAMINOPHEN 325 MG PO TABS
650.0000 mg | ORAL_TABLET | Freq: Once | ORAL | Status: AC
  Administered 2014-05-20: 650 mg via ORAL

## 2014-05-20 MED ORDER — HYDROCODONE-ACETAMINOPHEN 5-325 MG PO TABS: 1.0000 | ORAL_TABLET | Freq: Four times a day (QID) | ORAL | Status: DC | PRN

## 2014-05-20 MED ORDER — SODIUM CHLORIDE 0.9 % IJ SOLN
10.0000 mL | INTRAMUSCULAR | Status: DC | PRN
  Administered 2014-05-20: 10 mL
  Filled 2014-05-20: qty 10

## 2014-05-20 NOTE — Patient Instructions (Signed)

## 2014-05-20 NOTE — Patient Instructions (Signed)

## 2014-05-23 ENCOUNTER — Encounter (HOSPITAL_BASED_OUTPATIENT_CLINIC_OR_DEPARTMENT_OTHER): Payer: Self-pay | Admitting: *Deleted

## 2014-05-23 NOTE — Progress Notes (Signed)
Pt finished chemo-her sig other had a strock in hospital-a friend to bring her Labs done 05/20/14-ekg-9/15,cxr-1/16

## 2014-05-25 ENCOUNTER — Ambulatory Visit
Admission: RE | Admit: 2014-05-25 | Discharge: 2014-05-25 | Disposition: A | Discharge status: Home / Self Care | Payer: Medicaid Other | Source: Ambulatory Visit | Attending: General Surgery | Admitting: General Surgery

## 2014-05-25 DIAGNOSIS — C50412 Malignant neoplasm of upper-outer quadrant of left female breast: Secondary | ICD-10-CM

## 2014-05-25 MED ORDER — GADOBENATE DIMEGLUMINE 529 MG/ML IV SOLN
19.0000 mL | Freq: Once | INTRAVENOUS | Status: AC | PRN
  Administered 2014-05-25: 19 mL via INTRAVENOUS

## 2014-05-28 ENCOUNTER — Encounter (HOSPITAL_BASED_OUTPATIENT_CLINIC_OR_DEPARTMENT_OTHER): Payer: Self-pay | Admitting: *Deleted

## 2014-05-28 ENCOUNTER — Ambulatory Visit
Admission: RE | Admit: 2014-05-28 | Discharge: 2014-05-28 | Disposition: A | Discharge status: Home / Self Care | Payer: Medicaid Other | Source: Ambulatory Visit | Attending: General Surgery | Admitting: General Surgery

## 2014-05-28 ENCOUNTER — Other Ambulatory Visit (INDEPENDENT_AMBULATORY_CARE_PROVIDER_SITE_OTHER): Payer: Self-pay | Admitting: General Surgery

## 2014-05-28 DIAGNOSIS — C50412 Malignant neoplasm of upper-outer quadrant of left female breast: Secondary | ICD-10-CM

## 2014-05-28 DIAGNOSIS — C50912 Malignant neoplasm of unspecified site of left female breast: Secondary | ICD-10-CM

## 2014-05-29 ENCOUNTER — Encounter (HOSPITAL_BASED_OUTPATIENT_CLINIC_OR_DEPARTMENT_OTHER): Payer: Self-pay | Admitting: *Deleted

## 2014-05-29 ENCOUNTER — Ambulatory Visit (HOSPITAL_BASED_OUTPATIENT_CLINIC_OR_DEPARTMENT_OTHER): Payer: Medicaid Other | Admitting: Anesthesiology

## 2014-05-29 ENCOUNTER — Encounter (HOSPITAL_BASED_OUTPATIENT_CLINIC_OR_DEPARTMENT_OTHER)
Admission: RE | Disposition: A | Discharge status: Home / Self Care | Payer: Self-pay | Source: Ambulatory Visit | Attending: General Surgery

## 2014-05-29 ENCOUNTER — Ambulatory Visit
Admission: RE | Admit: 2014-05-29 | Discharge: 2014-05-29 | Disposition: A | Discharge status: Home / Self Care | Payer: Medicaid Other | Source: Ambulatory Visit | Attending: General Surgery | Admitting: General Surgery

## 2014-05-29 ENCOUNTER — Ambulatory Visit (HOSPITAL_BASED_OUTPATIENT_CLINIC_OR_DEPARTMENT_OTHER)
Admission: RE | Admit: 2014-05-29 | Discharge: 2014-05-29 | Disposition: A | Discharge status: Home / Self Care | Payer: Medicaid Other | Source: Ambulatory Visit | Attending: General Surgery | Admitting: General Surgery

## 2014-05-29 ENCOUNTER — Encounter (HOSPITAL_COMMUNITY)
Admission: RE | Admit: 2014-05-29 | Discharge: 2014-05-29 | Disposition: A | Discharge status: Home / Self Care | Payer: Medicaid Other | Source: Ambulatory Visit | Attending: General Surgery | Admitting: General Surgery

## 2014-05-29 DIAGNOSIS — C50919 Malignant neoplasm of unspecified site of unspecified female breast: Secondary | ICD-10-CM

## 2014-05-29 DIAGNOSIS — Z170 Estrogen receptor positive status [ER+]: Secondary | ICD-10-CM | POA: Diagnosis not present

## 2014-05-29 DIAGNOSIS — F17200 Nicotine dependence, unspecified, uncomplicated: Secondary | ICD-10-CM | POA: Insufficient documentation

## 2014-05-29 DIAGNOSIS — Z79899 Other long term (current) drug therapy: Secondary | ICD-10-CM | POA: Diagnosis not present

## 2014-05-29 DIAGNOSIS — J449 Chronic obstructive pulmonary disease, unspecified: Secondary | ICD-10-CM | POA: Insufficient documentation

## 2014-05-29 DIAGNOSIS — C50412 Malignant neoplasm of upper-outer quadrant of left female breast: Secondary | ICD-10-CM

## 2014-05-29 DIAGNOSIS — F419 Anxiety disorder, unspecified: Secondary | ICD-10-CM | POA: Diagnosis not present

## 2014-05-29 DIAGNOSIS — Z9889 Other specified postprocedural states: Secondary | ICD-10-CM | POA: Diagnosis not present

## 2014-05-29 DIAGNOSIS — C50912 Malignant neoplasm of unspecified site of left female breast: Secondary | ICD-10-CM

## 2014-05-29 DIAGNOSIS — Z791 Long term (current) use of non-steroidal anti-inflammatories (NSAID): Secondary | ICD-10-CM | POA: Insufficient documentation

## 2014-05-29 DIAGNOSIS — Z9221 Personal history of antineoplastic chemotherapy: Secondary | ICD-10-CM | POA: Insufficient documentation

## 2014-05-29 DIAGNOSIS — Z90710 Acquired absence of both cervix and uterus: Secondary | ICD-10-CM | POA: Diagnosis not present

## 2014-05-29 DIAGNOSIS — Z8673 Personal history of transient ischemic attack (TIA), and cerebral infarction without residual deficits: Secondary | ICD-10-CM | POA: Diagnosis not present

## 2014-05-29 DIAGNOSIS — Z79891 Long term (current) use of opiate analgesic: Secondary | ICD-10-CM | POA: Insufficient documentation

## 2014-05-29 DIAGNOSIS — Z7951 Long term (current) use of inhaled steroids: Secondary | ICD-10-CM | POA: Diagnosis not present

## 2014-05-29 DIAGNOSIS — F329 Major depressive disorder, single episode, unspecified: Secondary | ICD-10-CM | POA: Diagnosis not present

## 2014-05-29 HISTORY — PX: RADIOACTIVE SEED GUIDED PARTIAL MASTECTOMY WITH AXILLARY SENTINEL LYMPH NODE BIOPSY: SHX6520

## 2014-05-29 HISTORY — DX: Malignant neoplasm of unspecified site of unspecified female breast: C50.919

## 2014-05-29 SURGERY — RADIOACTIVE SEED GUIDED PARTIAL MASTECTOMY WITH AXILLARY SENTINEL LYMPH NODE BIOPSY
Anesthesia: General | Site: Breast | Laterality: Left

## 2014-05-29 MED ORDER — PROPOFOL 10 MG/ML IV BOLUS
INTRAVENOUS | Status: DC | PRN
  Administered 2014-05-29: 200 mg via INTRAVENOUS

## 2014-05-29 MED ORDER — HYDROMORPHONE HCL 1 MG/ML IJ SOLN
0.2500 mg | INTRAMUSCULAR | Status: DC | PRN
  Administered 2014-05-29 (×3): 0.5 mg via INTRAVENOUS

## 2014-05-29 MED ORDER — ONDANSETRON HCL 4 MG/2ML IJ SOLN
INTRAMUSCULAR | Status: DC | PRN
  Administered 2014-05-29: 4 mg via INTRAVENOUS

## 2014-05-29 MED ORDER — FENTANYL CITRATE 0.05 MG/ML IJ SOLN
INTRAMUSCULAR | Status: AC
  Filled 2014-05-29: qty 2

## 2014-05-29 MED ORDER — ONDANSETRON HCL 4 MG/2ML IJ SOLN: 4.0000 mg | Freq: Once | INTRAMUSCULAR | Status: DC | PRN

## 2014-05-29 MED ORDER — PROPOFOL 10 MG/ML IV BOLUS
INTRAVENOUS | Status: AC
  Filled 2014-05-29: qty 80

## 2014-05-29 MED ORDER — LIDOCAINE HCL (CARDIAC) 20 MG/ML IV SOLN
INTRAVENOUS | Status: DC | PRN
  Administered 2014-05-29: 50 mg via INTRAVENOUS

## 2014-05-29 MED ORDER — PROPOFOL 10 MG/ML IV EMUL
INTRAVENOUS | Status: AC
  Filled 2014-05-29: qty 50

## 2014-05-29 MED ORDER — FENTANYL CITRATE 0.05 MG/ML IJ SOLN
INTRAMUSCULAR | Status: DC | PRN
  Administered 2014-05-29 (×2): 50 ug via INTRAVENOUS

## 2014-05-29 MED ORDER — BUPIVACAINE-EPINEPHRINE (PF) 0.5% -1:200000 IJ SOLN
INTRAMUSCULAR | Status: DC | PRN
  Administered 2014-05-29: 25 mL via PERINEURAL

## 2014-05-29 MED ORDER — TECHNETIUM TC 99M SULFUR COLLOID FILTERED
1.0000 | Freq: Once | INTRAVENOUS | Status: AC | PRN
  Administered 2014-05-29: 1 via INTRADERMAL

## 2014-05-29 MED ORDER — BUPIVACAINE HCL (PF) 0.25 % IJ SOLN
INTRAMUSCULAR | Status: AC
  Filled 2014-05-29: qty 90

## 2014-05-29 MED ORDER — METHYLENE BLUE 1 % INJ SOLN
INTRAMUSCULAR | Status: DC | PRN
  Administered 2014-05-29: 08:00:00 via INTRAMUSCULAR

## 2014-05-29 MED ORDER — HYDROMORPHONE HCL 1 MG/ML IJ SOLN
INTRAMUSCULAR | Status: AC
  Filled 2014-05-29: qty 1

## 2014-05-29 MED ORDER — BUPIVACAINE HCL (PF) 0.25 % IJ SOLN: INTRAMUSCULAR | Status: DC | PRN

## 2014-05-29 MED ORDER — LACTATED RINGERS IV SOLN
INTRAVENOUS | Status: DC
Start: 2014-05-29 — End: 2014-05-29
  Administered 2014-05-29 (×2): via INTRAVENOUS

## 2014-05-29 MED ORDER — MIDAZOLAM HCL 5 MG/5ML IJ SOLN
INTRAMUSCULAR | Status: DC | PRN
  Administered 2014-05-29: 2 mg via INTRAVENOUS

## 2014-05-29 MED ORDER — OXYCODONE HCL 5 MG/5ML PO SOLN: 5.0000 mg | Freq: Once | ORAL | Status: AC | PRN

## 2014-05-29 MED ORDER — MIDAZOLAM HCL 2 MG/2ML IJ SOLN
INTRAMUSCULAR | Status: AC
  Filled 2014-05-29: qty 2

## 2014-05-29 MED ORDER — MIDAZOLAM HCL 2 MG/2ML IJ SOLN
1.0000 mg | INTRAMUSCULAR | Status: DC | PRN
  Administered 2014-05-29: 2 mg via INTRAVENOUS

## 2014-05-29 MED ORDER — FENTANYL CITRATE 0.05 MG/ML IJ SOLN
50.0000 ug | INTRAMUSCULAR | Status: DC | PRN
  Administered 2014-05-29 (×2): 100 ug via INTRAVENOUS

## 2014-05-29 MED ORDER — OXYCODONE HCL 5 MG PO TABS
5.0000 mg | ORAL_TABLET | Freq: Once | ORAL | Status: AC | PRN
  Administered 2014-05-29: 5 mg via ORAL

## 2014-05-29 MED ORDER — CEFAZOLIN SODIUM-DEXTROSE 2-3 GM-% IV SOLR
INTRAVENOUS | Status: DC | PRN
  Administered 2014-05-29: 2 g via INTRAVENOUS

## 2014-05-29 MED ORDER — OXYCODONE HCL 5 MG PO TABS
ORAL_TABLET | ORAL | Status: AC
Start: 2014-05-29 — End: 2014-05-29
  Filled 2014-05-29: qty 1

## 2014-05-29 MED ORDER — CEFAZOLIN SODIUM-DEXTROSE 2-3 GM-% IV SOLR
INTRAVENOUS | Status: AC
  Filled 2014-05-29: qty 50

## 2014-05-29 MED ORDER — DEXAMETHASONE SODIUM PHOSPHATE 4 MG/ML IJ SOLN
INTRAMUSCULAR | Status: DC | PRN
  Administered 2014-05-29: 10 mg via INTRAVENOUS

## 2014-05-29 MED ORDER — CEFAZOLIN SODIUM-DEXTROSE 2-3 GM-% IV SOLR: 2.0000 g | INTRAVENOUS | Status: DC

## 2014-05-29 MED ORDER — FENTANYL CITRATE 0.05 MG/ML IJ SOLN
INTRAMUSCULAR | Status: AC
  Filled 2014-05-29: qty 6

## 2014-05-29 MED ORDER — HYDROCODONE-ACETAMINOPHEN 5-325 MG PO TABS: 1.0000 | ORAL_TABLET | ORAL | Status: DC | PRN

## 2014-05-29 SURGICAL SUPPLY — 65 items
APPLIER CLIP 9.375 MED OPEN (MISCELLANEOUS) ×3
BENZOIN TINCTURE PRP APPL 2/3 (GAUZE/BANDAGES/DRESSINGS) IMPLANT
BINDER BREAST LRG (GAUZE/BANDAGES/DRESSINGS) ×3 IMPLANT
BINDER BREAST MEDIUM (GAUZE/BANDAGES/DRESSINGS) IMPLANT
BINDER BREAST XLRG (GAUZE/BANDAGES/DRESSINGS) ×3 IMPLANT
BINDER BREAST XXLRG (GAUZE/BANDAGES/DRESSINGS) IMPLANT
BLADE SURG 15 STRL LF DISP TIS (BLADE) ×1 IMPLANT
BLADE SURG 15 STRL SS (BLADE) ×2
CANISTER SUC SOCK COL 7IN (MISCELLANEOUS) IMPLANT
CANISTER SUCT 1200ML W/VALVE (MISCELLANEOUS) IMPLANT
CHLORAPREP W/TINT 26ML (MISCELLANEOUS) ×3 IMPLANT
CLIP APPLIE 9.375 MED OPEN (MISCELLANEOUS) ×1 IMPLANT
CLOSURE WOUND 1/2 X4 (GAUZE/BANDAGES/DRESSINGS) ×1
COVER BACK TABLE 60X90IN (DRAPES) ×3 IMPLANT
COVER MAYO STAND STRL (DRAPES) ×3 IMPLANT
COVER PROBE W GEL 5X96 (DRAPES) ×3 IMPLANT
DECANTER SPIKE VIAL GLASS SM (MISCELLANEOUS) IMPLANT
DEVICE DUBIN W/COMP PLATE 8390 (MISCELLANEOUS) ×3 IMPLANT
DRAPE LAPAROSCOPIC ABDOMINAL (DRAPES) ×3 IMPLANT
DRAPE UTILITY XL STRL (DRAPES) ×3 IMPLANT
DRSG TEGADERM 4X4.75 (GAUZE/BANDAGES/DRESSINGS) IMPLANT
ELECT COATED BLADE 2.86 ST (ELECTRODE) ×3 IMPLANT
ELECT REM PT RETURN 9FT ADLT (ELECTROSURGICAL) ×3
ELECTRODE REM PT RTRN 9FT ADLT (ELECTROSURGICAL) ×1 IMPLANT
GLOVE BIO SURGEON STRL SZ7 (GLOVE) ×6 IMPLANT
GLOVE BIOGEL PI IND STRL 6.5 (GLOVE) ×1 IMPLANT
GLOVE BIOGEL PI IND STRL 7.0 (GLOVE) ×2 IMPLANT
GLOVE BIOGEL PI IND STRL 7.5 (GLOVE) ×1 IMPLANT
GLOVE BIOGEL PI INDICATOR 6.5 (GLOVE) ×2
GLOVE BIOGEL PI INDICATOR 7.0 (GLOVE) ×4
GLOVE BIOGEL PI INDICATOR 7.5 (GLOVE) ×2
GLOVE ECLIPSE 6.5 STRL STRAW (GLOVE) ×3 IMPLANT
GOWN STRL REUS W/ TWL LRG LVL3 (GOWN DISPOSABLE) ×2 IMPLANT
GOWN STRL REUS W/TWL LRG LVL3 (GOWN DISPOSABLE) ×4
KIT MARKER MARGIN INK (KITS) ×3 IMPLANT
LIQUID BAND (GAUZE/BANDAGES/DRESSINGS) ×3 IMPLANT
MARKER SKIN DUAL TIP RULER LAB (MISCELLANEOUS) ×6 IMPLANT
NDL SAFETY ECLIPSE 18X1.5 (NEEDLE) ×1 IMPLANT
NEEDLE HYPO 18GX1.5 SHARP (NEEDLE) ×2
NEEDLE HYPO 25X1 1.5 SAFETY (NEEDLE) ×6 IMPLANT
NS IRRIG 1000ML POUR BTL (IV SOLUTION) IMPLANT
PACK BASIN DAY SURGERY FS (CUSTOM PROCEDURE TRAY) ×3 IMPLANT
PENCIL BUTTON HOLSTER BLD 10FT (ELECTRODE) ×3 IMPLANT
SHEET MEDIUM DRAPE 40X70 STRL (DRAPES) IMPLANT
SLEEVE SCD COMPRESS KNEE MED (MISCELLANEOUS) ×3 IMPLANT
SPONGE GAUZE 4X4 12PLY STER LF (GAUZE/BANDAGES/DRESSINGS) ×3 IMPLANT
SPONGE LAP 4X18 X RAY DECT (DISPOSABLE) ×3 IMPLANT
STAPLER VISISTAT 35W (STAPLE) ×3 IMPLANT
STOCKINETTE IMPERVIOUS LG (DRAPES) IMPLANT
STRIP CLOSURE SKIN 1/2X4 (GAUZE/BANDAGES/DRESSINGS) ×2 IMPLANT
SUT ETHILON 2 0 FS 18 (SUTURE) IMPLANT
SUT MNCRL AB 4-0 PS2 18 (SUTURE) ×3 IMPLANT
SUT MON AB 5-0 PS2 18 (SUTURE) ×6 IMPLANT
SUT SILK 2 0 SH (SUTURE) IMPLANT
SUT VIC AB 2-0 SH 27 (SUTURE) ×4
SUT VIC AB 2-0 SH 27XBRD (SUTURE) ×2 IMPLANT
SUT VIC AB 3-0 SH 27 (SUTURE) ×2
SUT VIC AB 3-0 SH 27X BRD (SUTURE) ×1 IMPLANT
SUT VIC AB 5-0 PS2 18 (SUTURE) IMPLANT
SYR CONTROL 10ML LL (SYRINGE) ×6 IMPLANT
TOWEL OR 17X24 6PK STRL BLUE (TOWEL DISPOSABLE) IMPLANT
TOWEL OR NON WOVEN STRL DISP B (DISPOSABLE) ×3 IMPLANT
TUBE CONNECTING 20'X1/4 (TUBING)
TUBE CONNECTING 20X1/4 (TUBING) IMPLANT
YANKAUER SUCT BULB TIP NO VENT (SUCTIONS) IMPLANT

## 2014-05-29 NOTE — Op Note (Addendum)
Preoperative diagnosis: Left breast cancer status post primary chemotherapy Postoperative diagnosis: Same as above Procedure: #1 left breast radioactive seed guided lumpectomy #2 left axillary sentinel lymph node biopsy #3 injection of blue Haik for sentinel node identification Surgeon: Dr. Serita Grammes Anesthesia: Gen. With pectoral block Estimated blood loss: Minimal Drains: None Complications: None Specimens: #1 left breast radioactive seed guided lumpectomy marked with paint #2 left axillary sentinel lymph node 3 with counts ranging from 89-324 #3 additional axillary tissue Disposition to recovery stable Sponge and needle count was correct completion  Indications: This is a 49 yo female with a triple positive tumor who underwent primary chemotherapy. She had a number of MR findings that were biopsied beforehand. This all resolved on her MRI. We discussed all of her options and decided to proceed with a lumpectomy and a sentinel lymph node biopsy. She had a radioactive seed placed prior to coming to the operating room.  Procedure: After informed consent was obtained the patient was first injected with technetium in the standard periareolar fashion. She also underwent a pectoral block. She was given 2 g of cefazolin. Sequential compression devices were on her legs. She was  placed under general anesthesia without complication. Her left breast was prepped and draped in the standard sterile surgical fashion. A surgical timeout was then performed. I then infiltrated a mixture of methylene blue Grilliot and saline in the periareolar position and massaged this.  I made a radial incision in the outer half of the left breast. I did remove a portion of her skin as the seed was close in one of the mammographic views. I then used cautery under neoprobe guidance to excise the seed any surrounding tissue with an attempt to get a clear margin. I took this back several centimeters posteriorly due to some  abnormalities she had on her previous MRI although so those were negative on a biopsy. I then marked this with paint. There was no radioactivity remaining in the breast except for the technetium. The seed was present in the specimen. Mammogram confirmed removal of the seed as well as the clip. This was then sent to pathology. I then placed clips around my cavity. I closed this with 2-0 Vicryl, 3-0 Vicryl, and 4-0 Monocryl. Glue and Steri-Strips were placed.  I located the sentinel node. I made an incision below the axillary hairline. I carried this through the axillary fascia. I identified the sentinel nodes. 2 of these were hot and one was blue. I excised these. There was no background radioactivity. There was no more blue Desire present. Hemostasis observed. I then closed this with 2-0 Vicryl. The  skin was closed with 3-0 Vicryl and 4-0 Monocryl. Glue and Steri-Strips were applied. A breast binder was placed. She tolerated this well was extubated and transferred to the recovery room in stable condition.

## 2014-05-29 NOTE — Progress Notes (Signed)
Assisted Dr. Crews with left, ultrasound guided, pectoralis block. Side rails up, monitors on throughout procedure. See vital signs in flow sheet. Tolerated Procedure well. 

## 2014-05-29 NOTE — Discharge Instructions (Signed)
[facility] Office Phone Number [phone]  BREAST SURGERY: POST OP INSTRUCTIONS  Always review your discharge instruction sheet given to you by the facility where your surgery was performed.  IF YOU HAVE DISABILITY OR FAMILY LEAVE FORMS, YOU MUST BRING THEM TO THE OFFICE FOR PROCESSING.  DO NOT GIVE THEM TO YOUR DOCTOR.  1. A prescription for pain medication may be given to you upon discharge.  Take your pain medication as prescribed, if needed.  If narcotic pain medicine is not needed, then you may take acetaminophen (Tylenol), naprosyn (Alleve) or ibuprofen (Advil) as needed. 2. Take your usually prescribed medications unless otherwise directed 3. If you need a refill on your pain medication, please contact your pharmacy.  They will contact our office to request authorization.  Prescriptions will not be filled after 5pm or on week-ends. 4. You should eat very light the first 24 hours after surgery, such as soup, crackers, pudding, etc.  Resume your normal diet the day after surgery. 5. Most patients will experience some swelling and bruising in the breast.  Ice packs and a good support bra will help.  Wear the breast binder provided or a sports bra for 72 hours day and night.  After that wear a sports bra during the day until you return to the office. Swelling and bruising can take several days to resolve.  6. It is common to experience some constipation if taking pain medication after surgery.  Increasing fluid intake and taking a stool softener will usually help or prevent this problem from occurring.  A mild laxative (Milk of Magnesia or Miralax) should be taken according to package directions if there are no bowel movements after 48 hours. 7. Unless discharge instructions indicate otherwise, you may remove your bandages 48 hours after surgery and you may shower at that time.  You may have steri-strips (small skin tapes) in place directly over the incision.  These strips should  be left on the skin for 7-10 days and will come off on their own.  If your surgeon used skin glue on the incision, you may shower in 24 hours.  The glue will flake off over the next 2-3 weeks.  Any sutures or staples will be removed at the office during your follow-up visit. 8. ACTIVITIES:  You may resume regular daily activities (gradually increasing) beginning the next day.  Wearing a good support bra or sports bra minimizes pain and swelling.  You may have sexual intercourse when it is comfortable. a. You may drive when you no longer are taking prescription pain medication, you can comfortably wear a seatbelt, and you can safely maneuver your car and apply brakes. b. RETURN TO WORK:  ______________________________________________________________________________________ 9. You should see your doctor in the office for a follow-up appointment approximately two weeks after your surgery.  Your doctors nurse will typically make your follow-up appointment when she calls you with your pathology report.  Expect your pathology report 3-4 business days after your surgery.  You may call to check if you do not hear from [place] after three days. 10. OTHER INSTRUCTIONS: _______________________________________________________________________________________________ _____________________________________________________________________________________________________________________________________ _____________________________________________________________________________________________________________________________________ _____________________________________________________________________________________________________________________________________  WHEN TO CALL DR WAKEFIELD: 1. Fever over 101.0 2. Nausea and/or vomiting. 3. Extreme swelling or bruising. 4. Continued bleeding from incision. 5. Increased pain, redness, or drainage from the incision.  The clinic staff is available to answer your questions  during regular business hours.  Please dont hesitate to call and ask to speak to one of the nurses for clinical concerns.  If you have a medical emergency, go to the nearest emergency room or call 911.  A surgeon from [facility] is always on call at the hospital.  For further questions, please visit [email or web address]    Post Anesthesia Home Care Instructions  Activity: Get plenty of rest for the remainder of the day. A responsible adult should stay with you for 24 hours following the procedure.  For the next 24 hours, DO NOT: -Drive a car -Paediatric nurse -Drink alcoholic beverages -Take any medication unless instructed by your physician -Make any legal decisions or sign important papers.  Meals: Start with liquid foods such as gelatin or soup. Progress to regular foods as tolerated. Avoid greasy, spicy, heavy foods. If nausea and/or vomiting occur, drink only clear liquids until the nausea and/or vomiting subsides. Call your physician if vomiting continues.  Special Instructions/Symptoms: Your throat may feel dry or sore from the anesthesia or the breathing tube placed in your throat during surgery. If this causes discomfort, gargle with warm salt water. The discomfort should disappear within 24 hours.   Regional Anesthesia Blocks  1. Numbness or the inability to move the "blocked" extremity may last from 3-48 hours after placement. The length of time depends on the medication injected and your individual response to the medication. If the numbness is not going away after 48 hours, call your surgeon.  2. The extremity that is blocked will need to be protected until the numbness is gone and the  Strength has returned. Because you cannot feel it, you will need to take extra care to avoid injury. Because it may be weak, you may have difficulty moving it or using it. You may not know what position it is in without looking at it while the block is in  effect.  3. For blocks in the legs and feet, returning to weight bearing and walking needs to be done carefully. You will need to wait until the numbness is entirely gone and the strength has returned. You should be able to move your leg and foot normally before you try and bear weight or walk. You will need someone to be with you when you first try to ensure you do not fall and possibly risk injury.  4. Bruising and tenderness at the needle site are common side effects and will resolve in a few days.  5. Persistent numbness or new problems with movement should be communicated to the surgeon or the [facility] [phone] [place] [phone]).

## 2014-05-29 NOTE — Anesthesia Postprocedure Evaluation (Signed)
  Anesthesia Post-op Note  Patient: Jessica [name]  Procedure(s) Performed: Procedure(s): RADIOACTIVE SEED GUIDED LEFT PARTIAL MASTECTOMY WITH AXILLARY SENTINEL LYMPH NODE BIOPSY (Left)  Patient Location: PACU  Anesthesia Type: General   Level of Consciousness: awake, alert  and oriented  Airway and Oxygen Therapy: Patient Spontanous Breathing  Post-op Pain: mild  Post-op Assessment: Post-op Vital signs reviewed  Post-op Vital Signs: Reviewed  Last Vitals:  Filed Vitals:   05/29/14 1019  BP: 128/90  Pulse: 96  Temp: 36.6 C  Resp: 16    Complications: No apparent anesthesia complications

## 2014-05-29 NOTE — Anesthesia Preprocedure Evaluation (Signed)
Anesthesia Evaluation  Patient identified by MRN, date of birth, ID band Patient awake    Reviewed: Allergy & Precautions, NPO status , Patient's Chart, lab work & pertinent test results  Airway Mallampati: I  TM Distance: >3 FB Neck ROM: Full    Dental  (+) Teeth Intact, Dental Advisory Given   Pulmonary COPDCurrent Smoker,  breath sounds clear to auscultation        Cardiovascular Rhythm:Regular Rate:Normal     Neuro/Psych    GI/Hepatic   Endo/Other    Renal/GU      Musculoskeletal   Abdominal   Peds  Hematology   Anesthesia Other Findings   Reproductive/Obstetrics                             Anesthesia Physical Anesthesia Plan  ASA: III  Anesthesia Plan: General   Post-op Pain Management: MAC Combined w/ Regional for Post-op pain   Induction: Intravenous  Airway Management Planned: LMA  Additional Equipment:   Intra-op Plan:   Post-operative Plan:   Informed Consent: I have reviewed the patients History and Physical, chart, labs and discussed the procedure including the risks, benefits and alternatives for the proposed anesthesia with the patient or authorized representative who has indicated his/her understanding and acceptance.   Dental advisory given  Plan Discussed with: CRNA, Anesthesiologist and Surgeon  Anesthesia Plan Comments:         Anesthesia Quick Evaluation

## 2014-05-29 NOTE — Transfer of Care (Signed)
Immediate Anesthesia Transfer of Care Note  Patient: Jessica [name]  Procedure(s) Performed: Procedure(s): RADIOACTIVE SEED GUIDED LEFT PARTIAL MASTECTOMY WITH AXILLARY SENTINEL LYMPH NODE BIOPSY (Left)  Patient Location: PACU  Anesthesia Type:GA combined with regional for post-op pain  Level of Consciousness: sedated  Airway & Oxygen Therapy: Patient Spontanous Breathing and Patient connected to face mask oxygen  Post-op Assessment: Report given to RN  Post vital signs: Reviewed and stable  Last Vitals:  Filed Vitals:   05/29/14 0715  BP: 140/96  Pulse: 89  Temp:   Resp: 18    Complications: No apparent anesthesia complications

## 2014-05-29 NOTE — Interval H&P Note (Signed)
History and Physical Interval Note:  05/29/2014 7:24 AM  Jessica [name]  has presented today for surgery, with the diagnosis of LEFT BREAST CANCER  The various methods of treatment have been discussed with the patient and family. After consideration of risks, benefits and other options for treatment, the patient has consented to  Procedure(s): RADIOACTIVE SEED GUIDED LEFT PARTIAL MASTECTOMY WITH AXILLARY SENTINEL LYMPH NODE BIOPSY (Left) as a surgical intervention .  [name] history has been reviewed, patient examined, no change in status, stable for surgery.  I have reviewed the patient's chart and labs.  Questions were answered to the patient's satisfaction.     Elizar Alpern

## 2014-05-29 NOTE — Anesthesia Procedure Notes (Addendum)
Procedure Name: LMA Insertion Date/Time: 05/29/2014 7:39 AM Performed by: Toula Moos L Pre-anesthesia Checklist: Patient identified, Emergency Drugs available, Suction available, Patient being monitored and Timeout performed Patient Re-evaluated:Patient Re-evaluated prior to inductionOxygen Delivery Method: Circle System Utilized Preoxygenation: Pre-oxygenation with 100% oxygen Intubation Type: IV induction Ventilation: Mask ventilation without difficulty LMA: LMA inserted LMA Size: 4.0 Number of attempts: 1 Airway Equipment and Method: Bite block Placement Confirmation: positive ETCO2 Tube secured with: Tape Dental Injury: Teeth and Oropharynx as per pre-operative assessment    Anesthesia Regional Block:  Pectoralis block  Pre-Anesthetic Checklist: ,, timeout performed, Correct Patient, Correct Site, Correct Laterality, Correct Procedure, Correct Position, site marked, Risks and benefits discussed,  Surgical consent,  Pre-op evaluation,  At surgeon's request and post-op pain management  Laterality: Left and Upper  Prep: chloraprep       Needles:  Injection technique: Single-shot  Needle Type: Echogenic Needle     Needle Length: 9cm 9 cm Needle Gauge: 21 and 21 G    Additional Needles:  Procedures: ultrasound guided (picture in chart) Pectoralis block Narrative:  Start time: 05/29/2014 7:10 AM End time: 05/29/2014 7:15 AM Injection made incrementally with aspirations every 5 mL.  Performed by: Personally  Anesthesiologist: [place], [place]

## 2014-05-29 NOTE — H&P (Signed)
72 yof who was diagnosed in 11/2013 with left breast cancer. She palpated a mass and underwent evaluation with mm showing luoq distortion. Breast density was D. No axillary lad noted. She underwent MRI that showed a 2.9x2.2.7 cm lesion with nme extending about 6 cm with some nodules posteriorly. There were two other lesions in the left breast that were biopsied and were tubular adenomas. 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 She has undergone primary chemotherapy after port placement with carboplatin, docetaxel and dual anti-her 2 therapy. She has done well with this and has no real complaints. She completed this last Thursday. She cannot identify mass anymore. She comes back today with [place] but not an mri. She reports that her significant other is currently undergoing craniotomy after stroke yesterday.   Allergies (Sonya Bynum, CMA; 05/06/2014 10:20 AM) No Known Drug Allergies09/11/2013  Medication History (Sonya Bynum, CMA; 05/06/2014 10:20 AM) Symbicort (160-4.5MCG/ACT Aerosol, Inhalation) Active. Calcium Carbonate Antacid (500MG Tablet Chewable, Oral) Active. Gabapentin (600MG Tablet, Oral) Active. Ibuprofen (800MG Tablet, Oral) Active. Atrovent HFA (17MCG/ACT Aerosol Soln, Inhalation) Active. Ativan (0.5MG Tablet, Oral) Active. TraMADol HCl (50MG Tablet, Oral) Active.  Other Problems  Anxiety Disorder Back Pain Breast Cancer Chest pain Chronic Obstructive Lung Disease Depression Heart murmur  Past Surgical History  Breast Biopsy Left. Hysterectomy (not due to cancer) - Partial  Allergies  No Known Drug Allergies09/11/2013  Medication History  Symbicort (160-4.5MCG/ACT Aerosol, Inhalation) Active. Calcium Carbonate Antacid (500MG Tablet Chewable, Oral) Active. Gabapentin (600MG Tablet, Oral) Active. Ibuprofen (800MG Tablet, Oral) Active. Atrovent HFA (17MCG/ACT Aerosol Soln, Inhalation) Active. Ativan (0.5MG Tablet, Oral)  Active. TraMADol HCl (50MG Tablet, Oral) Active. Medications Reconciled  Social History  Alcohol use Occasional alcohol use. Caffeine use Carbonated beverages, Tea. Illicit drug use Remotely quit drug use. Tobacco use Current every day smoker.  Review of Systems  General Present- Fatigue. Not Present- Appetite Loss, Chills, Fever, Night Sweats, Weight Gain and Weight Loss. Skin Not Present- Change in Wart/Mole, Dryness, Hives, Jaundice, New Lesions, Non-Healing Wounds, Rash and Ulcer. HEENT Present- Seasonal Allergies. Not Present- Earache, Hearing Loss, Hoarseness, Nose Bleed, Oral Ulcers, Ringing in the Ears, Sinus Pain, Sore Throat, Visual Disturbances, Wears glasses/contact lenses and Yellow Eyes. Respiratory Not Present- Bloody sputum, Chronic Cough, Difficulty Breathing, Snoring and Wheezing. Breast Present- Breast Pain. Not Present- Breast Mass, Nipple Discharge and Skin Changes. Cardiovascular Present- Leg Cramps. Not Present- Chest Pain, Difficulty Breathing Lying Down, Palpitations, Rapid Heart Rate, Shortness of Breath and Swelling of Extremities. Gastrointestinal Not Present- Abdominal Pain, Bloating, Bloody Stool, Change in Bowel Habits, Chronic diarrhea, Constipation, Difficulty Swallowing, Excessive gas, Gets full quickly at meals, Hemorrhoids, Indigestion, Nausea, Rectal Pain and Vomiting. Female Genitourinary Present- Nocturia and Pelvic Pain. Not Present- Frequency, Painful Urination and Urgency. Musculoskeletal Present- Back Pain and Joint Stiffness. Not Present- Joint Pain, Muscle Pain, Muscle Weakness and Swelling of Extremities. Neurological Present- Numbness. Not Present- Decreased Memory, Fainting, Headaches, Seizures, Tingling, Tremor, Trouble walking and Weakness. Psychiatric Present- Anxiety, Change in Sleep Pattern and Fearful. Not Present- Bipolar, Depression and Frequent crying. Endocrine Present- Hot flashes. Not Present- Cold Intolerance, Excessive Hunger,  Hair Changes, Heat Intolerance and New Diabetes. Hematology Not Present- Easy Bruising, Excessive bleeding, Gland problems, HIV and Persistent Infections.  Vitals (Sonya Bynum CMA; 05/06/2014 10:19 AM) 05/06/2014 10:19 AM Weight: 202 lb Height: 65in Body Surface Area: 2.05 m Body Mass Index: 33.61 kg/m Temp.: 66F(Temporal)  Pulse: 99 (Regular)  BP: 152/94 (Sitting, Left Arm, Standard)  Physical Exam [name]  MD; 05/06/2014 12:42 PM) General Mental Status-Alert. Orientation-Oriented X3. Chest and Lung Exam Chest and lung exam reveals -on auscultation, normal breath sounds, no adventitious sounds and normal vocal resonance. Breast Nipples-No Discharge. Breast - Bilateral-Normal. Breast Lump-No Palpable Breast Mass. Cardiovascular Cardiovascular examination reveals -normal heart sounds, regular rate and rhythm with no murmurs. Lymphatic Head & Neck General Head & Neck Lymphatics: Bilateral - Description - Normal. Axillary General Axillary Region: Bilateral - Description - Normal. Note: no Fernando Salinas adenopathy  Assessment & Plan [name] MD; 05/06/2014 12:47 PM) CARCINOMA OF BREAST UPPER OUTER QUADRANT, LEFT (174.4  C50.412) Impression: Left breast rs guided lumpectomy, left ax sn biopsy This has resolved clinically and by an [place] she recently had. She really needs an mr to follow up these areas to determine if they need to be excised also. These biopsies were benign. I had sent her previously after discussing with radiology biopsy of inner quadrant lesion and posterior extent of disease. Apparently her insurance has refused the follow up mri which would be the standard in this case. I will see if able to get this done. If not then we will have to proceed with surgery as she cannot pay for this. We discussed a sentinel lymph node biopsy as she still does not appear to having lymph node involvement right now. We discussed the performance of that with  injection of radioactive tracer and blue [place]. We discussed that she would have an incision underneath her axillary hairline. We discussed that there is a chance of having a positive node with a sentinel lymph node biopsy and we will await the permanent pathology to make any other first further decisions in terms of her treatment. One of these options might be to return to the operating room to perform an axillary lymph node dissection. We discussed up to a 5% risk lifetime of chronic shoulder pain as well as lymphedema associated with a sentinel lymph node biopsy. We discussed the options for treatment of the breast cancer which included lumpectomy versus a mastectomy. We discussed the performance of the lumpectomy with seed placement. We discussed a 10-20% chance of a positive margin requiring reexcision in the operating room. We also discussed that she will need radiation therapy if she undergoes lumpectomy. We discussed the mastectomy and the postoperative care for that as well. We discussed that there is no difference in her survival or local recurrence. MR shows no real abnormalities

## 2014-06-02 ENCOUNTER — Encounter (HOSPITAL_BASED_OUTPATIENT_CLINIC_OR_DEPARTMENT_OTHER): Payer: Self-pay | Admitting: General Surgery

## 2014-06-06 NOTE — Telephone Encounter (Signed)
None

## 2014-06-06 NOTE — Telephone Encounter (Signed)
none

## 2014-06-09 ENCOUNTER — Other Ambulatory Visit: Payer: Self-pay | Admitting: Oncology

## 2014-06-09 NOTE — Progress Notes (Unsigned)
Called by Dr. Rolena Infante. He followed up on the abnormality seen on the bone scan on [place] in the left distal femur. Plain films show only a little bit of calcium there, but the MRI shows more involvement. He does not know if this is a primary bone problem, although at her age would be unusual, metastatic disease, or something altogether different. He is suggesting a fine-needle aspiration in case they need to do surgery later (if it is metastatic disease C suggests a prophylactic pinning and then radiation).  He will set her up for the fine-needle biopsy. She will see me within the next week to discuss this further.

## 2014-06-10 ENCOUNTER — Other Ambulatory Visit: Payer: Self-pay | Admitting: Oncology

## 2014-06-10 ENCOUNTER — Ambulatory Visit: Payer: Medicaid Other

## 2014-06-10 ENCOUNTER — Other Ambulatory Visit (HOSPITAL_BASED_OUTPATIENT_CLINIC_OR_DEPARTMENT_OTHER): Payer: Medicaid Other

## 2014-06-10 ENCOUNTER — Ambulatory Visit (HOSPITAL_BASED_OUTPATIENT_CLINIC_OR_DEPARTMENT_OTHER): Payer: Medicaid Other | Admitting: Nurse Practitioner

## 2014-06-10 ENCOUNTER — Ambulatory Visit (HOSPITAL_BASED_OUTPATIENT_CLINIC_OR_DEPARTMENT_OTHER): Payer: Medicaid Other

## 2014-06-10 ENCOUNTER — Encounter: Payer: Self-pay | Admitting: Nurse Practitioner

## 2014-06-10 ENCOUNTER — Other Ambulatory Visit: Payer: Medicaid Other

## 2014-06-10 ENCOUNTER — Other Ambulatory Visit (HOSPITAL_COMMUNITY): Payer: Self-pay | Admitting: Orthopedic Surgery

## 2014-06-10 VITALS — BP 123/83 | HR 75 | Temp 97.9°F | Resp 18 | Ht 65.0 in | Wt 199.6 lb

## 2014-06-10 DIAGNOSIS — M545 Low back pain: Secondary | ICD-10-CM

## 2014-06-10 DIAGNOSIS — C50412 Malignant neoplasm of upper-outer quadrant of left female breast: Secondary | ICD-10-CM

## 2014-06-10 DIAGNOSIS — Z5112 Encounter for antineoplastic immunotherapy: Secondary | ICD-10-CM

## 2014-06-10 DIAGNOSIS — G4700 Insomnia, unspecified: Secondary | ICD-10-CM

## 2014-06-10 DIAGNOSIS — Z170 Estrogen receptor positive status [ER+]: Secondary | ICD-10-CM

## 2014-06-10 DIAGNOSIS — M79662 Pain in left lower leg: Secondary | ICD-10-CM

## 2014-06-10 DIAGNOSIS — M899 Disorder of bone, unspecified: Secondary | ICD-10-CM

## 2014-06-10 DIAGNOSIS — Z95828 Presence of other vascular implants and grafts: Secondary | ICD-10-CM

## 2014-06-10 LAB — CBC WITH DIFFERENTIAL/PLATELET
BASO%: 0.5 % (ref 0.0–2.0)
BASOS ABS: 0 10*3/uL (ref 0.0–0.1)
EOS%: 9.7 % — ABNORMAL HIGH (ref 0.0–7.0)
Eosinophils Absolute: 0.4 10*3/uL (ref 0.0–0.5)
HEMATOCRIT: 37.3 % (ref 34.8–46.6)
HEMOGLOBIN: 12.7 g/dL (ref 11.6–15.9)
LYMPH#: 1.4 10*3/uL (ref 0.9–3.3)
LYMPH%: 34.5 % (ref 14.0–49.7)
MCH: 34.6 pg — ABNORMAL HIGH (ref 25.1–34.0)
MCHC: 34 g/dL (ref 31.5–36.0)
MCV: 101.6 fL — ABNORMAL HIGH (ref 79.5–101.0)
MONO#: 0.3 10*3/uL (ref 0.1–0.9)
MONO%: 7.5 % (ref 0.0–14.0)
NEUT#: 2 10*3/uL (ref 1.5–6.5)
NEUT%: 47.8 % (ref 38.4–76.8)
Platelets: 147 10*3/uL (ref 145–400)
RBC: 3.67 10*6/uL — ABNORMAL LOW (ref 3.70–5.45)
RDW: 14.6 % — ABNORMAL HIGH (ref 11.2–14.5)
WBC: 4.1 10*3/uL (ref 3.9–10.3)

## 2014-06-10 LAB — COMPREHENSIVE METABOLIC PANEL (CC13)
ALBUMIN: 4 g/dL (ref 3.5–5.0)
ALT: 22 U/L (ref 0–55)
ANION GAP: 10 meq/L (ref 3–11)
AST: 20 U/L (ref 5–34)
Alkaline Phosphatase: 66 U/L (ref 40–150)
BILIRUBIN TOTAL: 0.49 mg/dL (ref 0.20–1.20)
BUN: 11.3 mg/dL (ref 7.0–26.0)
CHLORIDE: 106 meq/L (ref 98–109)
CO2: 25 meq/L (ref 22–29)
Calcium: 9.8 mg/dL (ref 8.4–10.4)
Creatinine: 0.8 mg/dL (ref 0.6–1.1)
EGFR: 85 mL/min/{1.73_m2} — ABNORMAL LOW (ref >90)
Glucose: 107 mg/dl (ref 70–140)
Potassium: 3.7 mEq/L (ref 3.5–5.1)
SODIUM: 141 meq/L (ref 136–145)
TOTAL PROTEIN: 6.5 g/dL (ref 6.4–8.3)

## 2014-06-10 MED ORDER — ACETAMINOPHEN 325 MG PO TABS
650.0000 mg | ORAL_TABLET | Freq: Once | ORAL | Status: AC
  Administered 2014-06-10: 650 mg via ORAL

## 2014-06-10 MED ORDER — DIPHENHYDRAMINE HCL 25 MG PO CAPS
ORAL_CAPSULE | ORAL | Status: AC
  Filled 2014-06-10: qty 2

## 2014-06-10 MED ORDER — ACETAMINOPHEN 325 MG PO TABS
ORAL_TABLET | ORAL | Status: AC
  Filled 2014-06-10: qty 2

## 2014-06-10 MED ORDER — ZOLPIDEM TARTRATE 5 MG PO TABS: 5.0000 mg | ORAL_TABLET | Freq: Every evening | ORAL | Status: AC | PRN

## 2014-06-10 MED ORDER — DIPHENHYDRAMINE HCL 25 MG PO CAPS
50.0000 mg | ORAL_CAPSULE | Freq: Once | ORAL | Status: AC
  Administered 2014-06-10: 50 mg via ORAL

## 2014-06-10 MED ORDER — SODIUM CHLORIDE 0.9 % IJ SOLN
10.0000 mL | INTRAMUSCULAR | Status: DC | PRN
  Administered 2014-06-10: 10 mL
  Filled 2014-06-10: qty 10

## 2014-06-10 MED ORDER — LORAZEPAM 0.5 MG PO TABS: 0.5000 mg | ORAL_TABLET | Freq: Every evening | ORAL | Status: DC | PRN

## 2014-06-10 MED ORDER — TRASTUZUMAB CHEMO INJECTION 440 MG
6.0000 mg/kg | Freq: Once | INTRAVENOUS | Status: AC
  Administered 2014-06-10: 546 mg via INTRAVENOUS
  Filled 2014-06-10: qty 26

## 2014-06-10 MED ORDER — HEPARIN SOD (PORK) LOCK FLUSH 100 UNIT/ML IV SOLN
500.0000 [IU] | Freq: Once | INTRAVENOUS | Status: AC | PRN
  Administered 2014-06-10: 500 [IU]
  Filled 2014-06-10: qty 5

## 2014-06-10 MED ORDER — SODIUM CHLORIDE 0.9 % IV SOLN
Freq: Once | INTRAVENOUS | Status: AC
  Administered 2014-06-10: 10:00:00 via INTRAVENOUS

## 2014-06-10 MED ORDER — SODIUM CHLORIDE 0.9 % IJ SOLN
10.0000 mL | INTRAMUSCULAR | Status: DC | PRN
  Administered 2014-06-10: 10 mL via INTRAVENOUS
  Filled 2014-06-10: qty 10

## 2014-06-10 MED ORDER — GABAPENTIN 300 MG PO CAPS: 300.0000 mg | ORAL_CAPSULE | Freq: Three times a day (TID) | ORAL | Status: DC

## 2014-06-10 NOTE — Patient Instructions (Signed)
[facility] Discharge Instructions for Patients Receiving Chemotherapy  Today you received the following chemotherapy agents Herceptin.  To help prevent nausea and vomiting after your treatment, we encourage you to take your nausea medication as prescribed.   If you develop nausea and vomiting that is not controlled by your nausea medication, call the clinic.   BELOW ARE SYMPTOMS THAT SHOULD BE REPORTED IMMEDIATELY:  *FEVER GREATER THAN 100.5 F  *CHILLS WITH OR WITHOUT FEVER  NAUSEA AND VOMITING THAT IS NOT CONTROLLED WITH YOUR NAUSEA MEDICATION  *UNUSUAL SHORTNESS OF BREATH  *UNUSUAL BRUISING OR BLEEDING  TENDERNESS IN MOUTH AND THROAT WITH OR WITHOUT PRESENCE OF ULCERS  *URINARY PROBLEMS  *BOWEL PROBLEMS  UNUSUAL RASH Items with * indicate a potential emergency and should be followed up as soon as possible.  Feel free to call the clinic you have any questions or concerns. The clinic phone number is [phone].    

## 2014-06-10 NOTE — Patient Instructions (Signed)

## 2014-06-10 NOTE — Progress Notes (Signed)
[place]  [phone] [phone]     ID: Jessica [name] DOB: Feb 14, 1966  MR#: [id number]  QQP#:[id number]  Patient Care Team: [name], MD as PCP - General (Nurse Practitioner) [name], MD as Consulting Physician (General Surgery) [name], MD as Consulting Physician (Oncology) Jodelle Gross, MD as Consulting Physician (Radiation Oncology)  CHIEF COMPLAINT: Newly diagnosed triple positive breast cancer CURRENT TREATMENT: neoadjuvant chemo-immunotherapy  BREAST CANCER HISTORY: From the original intake note:  Jessica [name] herself palpated a mass in her left breast. She brought it to the attention of [name] at the health department and she was set up for unilateral left mammography and ultrasound 11/27/2013 at the [facility]. This showed a possible distortion in the left breast upper outer quadrant. However the patient's breasts are density category D. On exam there was a firm nodule or mass in the left breast 2:00 location which by ultrasound was irregular and hypoechoic and measured 1.5 cm. There was no left axillary lymphadenopathy noted.  Biopsy of the mass in question 11/29/2013 showed ([id number]) an invasive ductal carcinoma, grade 1, estrogen receptor 94% positive, progesterone receptor 94% positive, both with strong staining intensity, with an MIB-1 of 46%, and HER-2 amplified by CISH, the signals ratio of 4.85 and the number per cell 6.55.  : 12/08/2013 the patient underwent bilateral breast MRI. This showed in the left breast upper outer quadrant a 2.9 cm irregular enhancing mass and extending posteriorly from this an area of clumped non-masslike enhancement extending approximately 6 cm and leading to a posterior group of nodules which were small but measured in aggregate 2.2 cm. In addition, there was a separate 0.8 cm irregular spiculated enhancing mass in the upper inner aspect of the left breast. In the  lower outer left breast there was a 0.8 cm oval enhancing mass. There were no abnormal appearing lymph nodes.  [name] subsequent history is as detailed below  INTERVAL HISTORY: Jessica [name] returns today for follow up of her breast cancer, alone. She is status post a left lumpectomy and removal of 3 lymph nodes on 2/11. She states the surgery "was a breeze." She had mild pain for about 1 week, but she has regained the range of motion to her left arm. The interval history is remarkable for a whole body bone scan that showed a lesion to her left femur. She is going to have a fine needle bone biopsy soon. Her lower back pain and leg pain is managed with hydrocodone. This news plus caring for her husband who had a stroke in January makes for a stressful home situation. He is partially paralyzed to his right upper extremity. He can walk and uses a walker to ambulate, but it is a task and she ends up doing all of the chores to care for him.  REVIEW OF SYSTEMS: Jessica [name] denies fevers or chills. Sometime she has mild nausea, but never vomits. She has some constipation with use of narcotic, but takes miralax daily with success. Her appetite is good. She does not sleep well at night, which has been a long standing issue, but this is compounded with the fact that she must wake up several hours in the night to care for her husband. She takes ativan or ambien PRN to help with sleep. A detailed review of systems is otherwise stable.  PAST MEDICAL HISTORY: Past Medical History  Diagnosis Date  . Anxiety   . COPD (chronic obstructive pulmonary disease) 2011  does not use inhaler ofter  . Depression   . Hot flashes   . Family history of anesthesia complication     Had a hard time waking father up only once after several procedures  . Shortness of breath     with exertion  . Chronic heartburn   . PVC (premature ventricular contraction)     PMH;at 49 y.o.  . Cancer     DCIS  . Asthma     denies history of  asthma  . Heart murmur     PMH: at age 36; no longer have murmur    PAST SURGICAL HISTORY: Past Surgical History  Procedure Laterality Date  . Polyp removal     . Tonsillectomy    . Wisdom tooth extraction    . Robotic assisted total hysterectomy with bilateral salpingo oopherectomy Bilateral 05/24/2013    Procedure: ROBOTIC ASSISTED TOTAL HYSTERECTOMY WITH BILATERAL SALPINGECTOMY;  Surgeon: Lavonia Drafts, MD;  Location: [facility];  Service: Gynecology;  Laterality: Bilateral;  . Cystoscopy N/A 05/24/2013    Procedure: CYSTOSCOPY;  Surgeon: Lavonia Drafts, MD;  Location: [facility];  Service: Gynecology;  Laterality: N/A;  . Abdominal hysterectomy    . Breast surgery      left breast biopsy x 2  . Portacath placement Right 01/02/2014    Procedure: INSERTION PORT-A-CATH;  Surgeon: [name], MD;  Location: [facility];  Service: General;  Laterality: Right;  . Radioactive seed guided mastectomy with axillary sentinel lymph node biopsy Left 05/29/2014    Procedure: RADIOACTIVE SEED GUIDED LEFT PARTIAL MASTECTOMY WITH AXILLARY SENTINEL LYMPH NODE BIOPSY;  Surgeon: [name], MD;  Location: [facility];  Service: General;  Laterality: Left;    FAMILY HISTORY Family History  Problem Relation Age of Onset  . Heart disease Mother   . Hypercholesterolemia Mother   . Heart disease Father   . COPD Father   . Hypercholesterolemia Father   . Cancer Neg Hx   [name] parents are still living, her father is 54 years old and her mother 74 years old as of August 2015. The patient had no brothers, one sister. There is no history of breast or ovarian cancer in the family.  GYNECOLOGIC HISTORY:  Patient's last menstrual period was 03/18/2013. Menarche age 75. She is GX P0. She took oral contraceptives for many years as well as Depo Provera shots. She is status post hysterectomy with bilateral salpingectomy. 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 She scans at work, and she also does all this she'll vein and stocking. She is single but for the last 11 years as lives with her significant other [name]. He is disabled secondary to multiple orthopedic problems including bilateral avascular necrosis of the hips. Both of them do smoke. The patient is here for drinks on weekends    ADVANCED DIRECTIVES: Not in place    HEALTH MAINTENANCE: History  Substance Use Topics  . Smoking status: Current Every Day Smoker -- 1.00 packs/day for 16 years    Types: Cigarettes  . Smokeless tobacco: Never Used  . Alcohol Use: Yes     Comment: occasional      Colonoscopy:  PAP: January 2015   Bone density:  Lipid panel:  No Known Allergies  Current Outpatient Prescriptions  Medication Sig Dispense Refill  . budesonide-formoterol (SYMBICORT) 160-4.5 MCG/ACT inhaler Inhale 1 puff into the lungs 2 (two) times daily.     Marland Kitchen  calcium carbonate (TUMS - DOSED IN MG ELEMENTAL CALCIUM) 500 MG chewable tablet Chew 1 tablet by mouth daily.    . cholestyramine (QUESTRAN) 4 G packet Take 1 packet (4 g total) by mouth 3 (three) times daily. (Patient not taking: Reported on 03/25/2014) 60 each 3  . cyclobenzaprine (FLEXERIL) 5 MG tablet Take 1 tablet (5 mg total) by mouth 2 (two) times daily as needed for muscle spasms. 60 tablet 1  . fluticasone (FLONASE) 50 MCG/ACT nasal spray Place 2 sprays into both nostrils daily. (Patient not taking: Reported on 05/08/2014) 16 g 2  . gabapentin (NEURONTIN) 300 MG capsule Take 1 capsule (300 mg total) by mouth 3 (three) times daily. 90 capsule 3  . HYDROcodone-acetaminophen (NORCO/VICODIN) 5-325 MG per tablet Take 1 tablet by mouth every 6 (six) hours as needed for moderate pain. 60 tablet 0  . HYDROcodone-acetaminophen (NORCO/VICODIN) 5-325 MG per tablet Take 1-2 tablets by mouth every 4 (four) hours as needed for moderate pain or severe pain. 40 tablet 0  . hydrocortisone (ANUSOL-HC) 25 MG  suppository Place 1 suppository (25 mg total) rectally 2 (two) times daily. (Patient not taking: Reported on 04/30/2014) 24 suppository 1  . hydrocortisone (PROCTOZONE-HC) 2.5 % rectal cream Place 1 application rectally 2 (two) times daily. 30 g 0  . ibuprofen (ADVIL,MOTRIN) 800 MG tablet Take 800 mg by mouth every 8 (eight) hours as needed (pain).    Marland Kitchen ipratropium (ATROVENT HFA) 17 MCG/ACT inhaler Inhale 1 puff into the lungs every 6 (six) hours as needed for wheezing (SOB).     Marland Kitchen loperamide (IMODIUM) 2 MG capsule TAKE ONE TABLET AFTER EACH LOOSE STOOL. MAXIMUM OF SIX TABLETS A DAY. CHEMOTHERAPY INDUCED DIARRHEA. 60 capsule 3  . loratadine (CLARITIN) 10 MG tablet Take 1 tablet (10 mg total) by mouth as needed. 90 tablet 1  . LORazepam (ATIVAN) 0.5 MG tablet Take 1 tablet (0.5 mg total) by mouth at bedtime as needed (Nausea or vomiting). 30 tablet 0  . omeprazole (PRILOSEC) 40 MG capsule Take 1 capsule (40 mg total) by mouth daily. 30 capsule 2  . traZODone (DESYREL) 50 MG tablet Take 1 tablet (50 mg total) by mouth at bedtime as needed for sleep. (Patient not taking: Reported on 04/15/2014) 30 tablet 3  . zolpidem (AMBIEN) 5 MG tablet Take 1 tablet (5 mg total) by mouth at bedtime as needed for sleep. 90 tablet 3   No current facility-administered medications for this visit.   Facility-Administered Medications Ordered in Other Visits  Medication Dose Route Frequency Provider Last Rate Last Dose  . 0.9 %  sodium chloride infusion   Intravenous Once Rulon Eisenmenger, MD      . heparin lock flush 100 unit/mL  500 Units Intracatheter Once PRN Rulon Eisenmenger, MD      . sodium chloride 0.9 % injection 10 mL  10 mL Intracatheter PRN Rulon Eisenmenger, MD      . trastuzumab (HERCEPTIN) 546 mg in sodium chloride 0.9 % 250 mL chemo infusion  6 mg/kg (Treatment Plan Actual) Intravenous Once Rulon Eisenmenger, MD        OBJECTIVE: middle-aged white woman who appears stated age 49 Vitals:   06/10/14 0915  BP:  123/83  Pulse: 75  Temp: 97.9 F (36.6 C)  Resp: 18     Body mass index is 33.22 kg/(m^2).    ECOG FS:1 - Symptomatic but completely ambulatory  Skin: warm, dry  HEENT: sclerae anicteric, conjunctivae pink, oropharynx clear. No  thrush or mucositis.  Lymph Nodes: No cervical or supraclavicular lymphadenopathy  Lungs: clear to auscultation bilaterally, no rales, wheezes, or rhonci  Heart: regular rate and rhythm  Abdomen: round, soft, non tender, positive bowel sounds  Musculoskeletal: No focal spinal tenderness, no peripheral edema  Neuro: non focal, well oriented, positive affect  Breasts: left breast status post lumpectomy. Incision to lower, outer quadrant covered with steri strips. Higher incision near left axilla. Healing well.  LAB RESULTS:  CMP     Component Value Date/Time   NA 141 06/10/2014 0850   NA 139 12/31/2013 1448   K 3.7 06/10/2014 0850   K 4.6 12/31/2013 1448   CL 103 12/31/2013 1448   CO2 25 06/10/2014 0850   CO2 24 12/31/2013 1448   GLUCOSE 107 06/10/2014 0850   GLUCOSE 93 12/31/2013 1448   BUN 11.3 06/10/2014 0850   BUN 9 12/31/2013 1448   CREATININE 0.8 06/10/2014 0850   CREATININE 0.83 12/31/2013 1448   CALCIUM 9.8 06/10/2014 0850   CALCIUM 8.8 12/31/2013 1448   PROT 6.5 06/10/2014 0850   PROT 7.1 06/04/2013 1511   ALBUMIN 4.0 06/10/2014 0850   ALBUMIN 3.4* 06/04/2013 1511   AST 20 06/10/2014 0850   AST 12 06/04/2013 1511   ALT 22 06/10/2014 0850   ALT 14 06/04/2013 1511   ALKPHOS 66 06/10/2014 0850   ALKPHOS 70 06/04/2013 1511   BILITOT 0.49 06/10/2014 0850   BILITOT 0.5 06/04/2013 1511   GFRNONAA 83* 12/31/2013 1448   GFRAA >90 12/31/2013 1448    I No results found for: SPEP  Lab Results  Component Value Date   WBC 4.1 06/10/2014   NEUTROABS 2.0 06/10/2014   HGB 12.7 06/10/2014   HCT 37.3 06/10/2014   MCV 101.6* 06/10/2014   PLT 147 06/10/2014      Chemistry      Component Value Date/Time   NA 141 06/10/2014 0850   NA 139  12/31/2013 1448   K 3.7 06/10/2014 0850   K 4.6 12/31/2013 1448   CL 103 12/31/2013 1448   CO2 25 06/10/2014 0850   CO2 24 12/31/2013 1448   BUN 11.3 06/10/2014 0850   BUN 9 12/31/2013 1448   CREATININE 0.8 06/10/2014 0850   CREATININE 0.83 12/31/2013 1448      Component Value Date/Time   CALCIUM 9.8 06/10/2014 0850   CALCIUM 8.8 12/31/2013 1448   ALKPHOS 66 06/10/2014 0850   ALKPHOS 70 06/04/2013 1511   AST 20 06/10/2014 0850   AST 12 06/04/2013 1511   ALT 22 06/10/2014 0850   ALT 14 06/04/2013 1511   BILITOT 0.49 06/10/2014 0850   BILITOT 0.5 06/04/2013 1511       No results found for: LABCA2  No components found for: [id number]  No results for input(s): INR in the last 168 hours.  Urinalysis    Component Value Date/Time   COLORURINE YELLOW 06/04/2013 1420   APPEARANCEUR HAZY* 06/04/2013 1420   LABSPEC 1.030 01/27/2014 1350   LABSPEC >1.030* 06/04/2013 1420   PHURINE 6.0 06/04/2013 1420   GLUCOSEU Negative 01/27/2014 1350   GLUCOSEU NEGATIVE 06/04/2013 1420   HGBUR MODERATE* 06/04/2013 1420   HGBUR negative 10/07/2009 [place] 06/04/2013 1420   KETONESUR NEGATIVE 06/04/2013 1420   PROTEINUR NEGATIVE 06/04/2013 1420   UROBILINOGEN Color Interference 01/27/2014 1350   UROBILINOGEN 0.2 06/04/2013 1420   NITRITE NEGATIVE 06/04/2013 1420   LEUKOCYTESUR NEGATIVE 06/04/2013 1420    STUDIES: Most recent echocardiogram on 05/05/14  shows an EF 55-60%  Nm Bone Scan Whole Body  05/16/2014   CLINICAL DATA:  History of left-sided breast carcinoma  EXAM: NUCLEAR MEDICINE WHOLE BODY BONE SCAN  Views:  Whole-body  Radionuclide:  Technetium 37mmethylene diphosphonate  Dose:  26.4 mCi  Route of administration:  Intravenous  COMPARISON:  None  FINDINGS: There is a focal area of increased uptake in the distal left femur. A more subtle secondary of increased uptake in the distal left femur is noted near the metaphysis -diaphysis junction. Slight symmetric  increased uptake in both shoulders and hips is felt to be of arthropathic etiology. Kidneys are noted in the flank positions bilaterally.  IMPRESSION: Abnormal radiotracer uptake in the distal left femur of uncertain etiology. Would advise radiographic correlation of this area to assess for possible neoplastic lesion in this area. No other findings are felt to be suspicious for potential neoplastic etiology. Slight increased uptake in each hip in shoulder joint is felt to be of arthropathic etiology.  These results will be called to the ordering clinician or representative by the Radiologist Assistant, and communication documented in the PACS or zVision Dashboard.   Electronically Signed   By: WLowella GripM.D.   On: 05/16/2014 14:10   Mr Breast Bilateral W Wo Contrast  05/26/2014   CLINICAL DATA:  Patient with history of left breast invasive ductal carcinoma undergoing neoadjuvant chemotherapy. Patient subsequently underwent MRI guided core needle biopsies of the left breast for additional areas of enhancement, demonstrating benign pathology.  EXAM: BILATERAL BREAST MRI WITH AND WITHOUT CONTRAST  TECHNIQUE: Multiplanar, multisequence MR images of both breasts were obtained prior to and following the intravenous administration of 19 ml of Multihance.  THREE-DIMENSIONAL MR IMAGE RENDERING ON INDEPENDENT WORKSTATION:  Three-dimensional MR images were rendered by post-processing of the original MR data on an independent workstation. The three-dimensional MR images were interpreted, and findings are reported in the following complete MRI report for this study. Three dimensional images were evaluated at the independent DynaCad workstation  COMPARISON:  Previous exam(s).  FINDINGS: Breast composition: d.  Extreme fibroglandular tissue.  Background parenchymal enhancement: Mild  Right breast: No mass or abnormal enhancement.  Left breast: Susceptibility artifact is demonstrated within the upper-outer left breast  middle depth at the site of biopsy-proven left breast invasive ductal carcinoma. There is minimal distortion at this location without residual enhancement.  Susceptibility artifact is demonstrated within the upper inner left breast posterior depth at the site of previous benign left breast MRI biopsy.  Susceptibility artifact is demonstrated within the lower outer left breast posterior depth at the site of previous benign left breast MRI biopsy.  Small 3-5 mm post biopsy hematoma within the upper-inner left breast. , decreased in size from MRI guided core needle biopsy images.  No concerning site of enhancement identified within the left breast.  Lymph nodes: No abnormal appearing lymph nodes.  Ancillary findings:  None.  IMPRESSION: Susceptibility artifact demonstrated within the upper-outer left breast middle depth at the site of biopsy-proven left breast invasive ductal carcinoma. There is residual distortion at this location without definitive enhancement, compatible with treatment response.  RECOMMENDATION: Treatment plan for known left breast malignancy.  BI-RADS CATEGORY  6: Known biopsy-proven malignancy.   Electronically Signed   By: DLovey NewcomerM.D.   On: 05/26/2014 08:50   Nm Sentinel Node Inj-no Rpt (breast)  05/29/2014   CLINICAL DATA: left ax sn biopsy   Sulfur colloid was injected intradermally by the nuclear medicine  technologist for breast cancer sentinel node localization.    Mm Breast Surgical Specimen  05/29/2014   CLINICAL DATA:  Patient with history of left breast invasive ductal carcinoma status post lumpectomy.  EXAM: SPECIMEN RADIOGRAPH OF THE LEFT BREAST  COMPARISON:  Previous exam(s).  FINDINGS: Status post excision of the left breast. The radioactive seed and biopsy marker clip are present, completely intact, and are marked for pathology.  IMPRESSION: Specimen radiograph of the left breast.   Electronically Signed   By: Lovey Newcomer M.D.   On: 05/29/2014 09:10   Mm Lt Radioactive  Seed Loc Mammo Guide  05/28/2014   CLINICAL DATA:  INC proven left breast 2 o'clock location invasive ductal carcinoma diagnosed August 2015. Status post neoadjuvant chemotherapy. Two previous benign left breast biopsies. Preoperative radioactive seed localization.  EXAM: MAMMOGRAPHIC GUIDED RADIOACTIVE SEED LOCALIZATION OF THE left BREAST  COMPARISON:  Previous exam(s).  FINDINGS: Patient presents for radioactive seed localization prior to lumpectomy. I met with the patient and we discussed the procedure of seed localization including benefits and alternatives. We discussed the high likelihood of a successful procedure. We discussed the risks of the procedure including infection, bleeding, tissue injury and further surgery. We discussed the low dose of radioactivity involved in the procedure. Informed, written consent was given.  The usual time-out protocol was performed immediately prior to the procedure.  Using mammographic guidance, sterile technique, 2% lidocaine and an I-125 radioactive seed, the ribbon shaped clip in the left breast 2 o'clock location was localized using a lateral to medial approach. The follow-up mammogram images confirm the seed in the expected location and are marked for Dr. Donne Hazel.  Follow-up survey of the patient confirms presence of the radioactive seed.  Order number of I-125 seed:  [id number].  Total activity:  0.252 mCi  Reference Date: 05/23/2014  The patient tolerated the procedure well and was released from the [facility]. She was given instructions regarding seed removal.  IMPRESSION: Radioactive seed localization left breast. No apparent complications.   Electronically Signed   By: Conchita Paris M.D.   On: 05/28/2014 10:26    ASSESSMENT: 49 y.o. BRCA negative  woman status post left breast upper outer quadrant biopsy 11/29/2013 for a clinical T2 N0, stage IIA invasive ductal carcinoma, grade 1, triple positive, with an MIB-1 of 46%.  (1) MRI-guided biopsy  of two additional areas in the left breast showed only tubular adenomas, no malignancy.   (2) neoadjuvant chemotherapy started 01/14/2014, consisting of carboplatin, docetaxel, trastuzumab and pertuzumab given every 21 days for 6 cycles, with Neulasta on day 2. Completed 04/30/14  (3) trastuzumab to be continued to the total one year, through September of 2016  (4) status post left lumpectomy with axillary sentinel lymph node biopsy on 2/11   (5) radiation to follow surgery  (6) antiestrogen therapy to follow radiation  (7) abnormal radiotracer uptake in distal left femur of uncertain etiology, fine needle biopsy to follow   PLAN: The labs were reviewed in detail and were entirely stable. Her last echocardiogram was in January and showed a well preserved ejection fraction. Her next one will be due in April. She will proceed with trastuzumab as planned today.   I have placed a consult to Dr. Ida Rogue office for discussion on radiation therapy after she recovers from surgery. She will continue to follow up with Dr. Rolena Infante regarding her left femur lesion. I have refilled her lorazepam, ativan, and gabapentin during this visit.  Jessica [name] will continue with trastuzumab  every 3 weeks in the interim. Her next office visit will be with Dr. Jana Hakim in March. She understands and agrees with this plan. She knows the goal of treatment in her case is cure. She has been encouraged to call with any issues that might arise before her next visit here.  Jessica Duster, NP   06/10/2014 9:54 AM

## 2014-06-12 ENCOUNTER — Encounter: Payer: Self-pay | Admitting: Radiation Oncology

## 2014-06-12 NOTE — Progress Notes (Signed)
Location of Breast Cancer: FUNC Left Breast Upper outer quadrant   Histology per Pathology Report: Diagnosis 12/17/13:1. Breast, left, needle core biopsy, 5 o'clock - TUBULAR ADENOMA WITH CALCIFICATIONS.- FIBROCYSTIC CHANGES WITH CALCIFICATIONS.- PSEUDOANGIOMATOUS STROMAL HYPERPLASIA ([place]). - THERE IS NO EVIDENCE OF MALIGNANCY.- SEE COMMENT.2. Breast, left, needle core biopsy, 11 o'clock- TUBULAR ADENOMA WITH CALCIFICATIONS.- FIBROADENOMA- THERE IS NO EVIDENCE OF MALIGNANCY.  Diagnosis 11/29/13: Breast, left, needle core biopsy, mass, 2 o'clock- INVASIVE DUCTAL CARCINOMA. Receptor Status: ER( +  ), PR ( +  ), Her2-neu (  + )  Did patient present with symptoms (if so, please note symptoms) or was this found on screening mammography?: patient felt a mass  Past/Anticipated interventions by surgeon, if QJJ:HERDEYCXK 05/29/14: 1. Breast, lumpectomy, Left - INVASIVE DUCTAL CARCINOMA, SEE COMMENT.- NEGATIVE FOR LYMPH VASCULAR INVASION- INVASIVE TUMOR IS 4 MM FROM NEAREST MARGIN (SUPERIOR).- PREVIOUS BIOPSY SITE.- SEE TUMOR SYNOPTIC TEMPLATE BELOW 2. Lymph node, sentinel, biopsy, Left axillary- BENIGN FIBROVASCULAR AND ADIPOSE SOFT TISSUE.- NEGATIVE FOR LYMPH NODE.- NEGATIVE FOR ATYPIA OR MALIGNANCY. 3. Lymph node, sentinel, biopsy, Left axillary #1- ONE LYMPH NODE, NEGATIVE FOR TUMOR (0/1). 4. Lymph node, sentinel, biopsy, Left axillary #2- ONE LYMPH NODE NEGATIVE FOR TUMOR (0/1). 5. Lymph node, sentinel, biopsy, Left axillary #3- BENIGN FIBROVASCULAR AND ADIPOSE SOFT TISSUE.- NEGATIVE FOR LYMPH NODE.- NEGATIVE FOR ATYPIA OR MALIGNANCY. Dr. Darleen Crocker Microscopic Comment1. BREAST, INVASIVE TUMOR, WITH LYMPH NODES PRESENT,   Past/Anticipated interventions by medical oncology, if any: Chemotherapy :, Chemotherapy  Carboplatin/docetaxel and dual anti her 2 therapy, completed 05/22/14,  Dr. Jana Hakim 07/02/14 appt with 2 hour infusion, BRCA!,BRCA2 neg.Herceptin scheduled q 3 weeks, due this Wednesday  07/02/14  Lymphedema issues, if any:  06/23/14 Evaluation Lymphedema, [name], [facility], referred by Dr. Donne Hazel   Pain issues, if any: tenderness in breast  SAFETY ISSUES: NO  Prior radiation? NO  Pacemaker/ICD?   Possible current pregnancy? NO  Is the patient on methotrexate?   Current Complaints / other details:  Seen 12/11/13 as initial consult , still cigarette smoker,occasional alcohol use Has tumor in left thigh, in femur bone, bx was benign, goes to [facility] Dr. Redmond Pulling, [facility] Allergies: NKDA     Iyesha Such, Felicita Gage, RN 06/12/2014,10:38 AM

## 2014-06-18 ENCOUNTER — Ambulatory Visit: Payer: Medicaid Other

## 2014-06-18 ENCOUNTER — Ambulatory Visit
Admission: RE | Admit: 2014-06-18 | Discharge: 2014-06-18 | Disposition: A | Discharge status: Home / Self Care | Payer: Medicaid Other | Source: Ambulatory Visit | Attending: Radiation Oncology | Admitting: Radiation Oncology

## 2014-06-18 DIAGNOSIS — Z510 Encounter for antineoplastic radiation therapy: Secondary | ICD-10-CM | POA: Insufficient documentation

## 2014-06-18 DIAGNOSIS — C50412 Malignant neoplasm of upper-outer quadrant of left female breast: Secondary | ICD-10-CM | POA: Insufficient documentation

## 2014-06-18 DIAGNOSIS — Z79891 Long term (current) use of opiate analgesic: Secondary | ICD-10-CM | POA: Insufficient documentation

## 2014-06-18 DIAGNOSIS — D1622 Benign neoplasm of long bones of left lower limb: Secondary | ICD-10-CM | POA: Insufficient documentation

## 2014-06-18 DIAGNOSIS — Z79899 Other long term (current) drug therapy: Secondary | ICD-10-CM | POA: Insufficient documentation

## 2014-06-18 DIAGNOSIS — L599 Disorder of the skin and subcutaneous tissue related to radiation, unspecified: Secondary | ICD-10-CM | POA: Insufficient documentation

## 2014-06-18 DIAGNOSIS — G893 Neoplasm related pain (acute) (chronic): Secondary | ICD-10-CM | POA: Insufficient documentation

## 2014-06-18 HISTORY — DX: Malignant neoplasm of unspecified site of unspecified female breast: C50.919

## 2014-06-19 ENCOUNTER — Other Ambulatory Visit: Payer: Self-pay | Admitting: Radiology

## 2014-06-20 ENCOUNTER — Ambulatory Visit (HOSPITAL_COMMUNITY)
Admission: RE | Admit: 2014-06-20 | Discharge: 2014-06-20 | Disposition: A | Discharge status: Home / Self Care | Payer: Medicaid Other | Source: Ambulatory Visit | Attending: Orthopedic Surgery | Admitting: Orthopedic Surgery

## 2014-06-20 ENCOUNTER — Encounter (HOSPITAL_COMMUNITY): Payer: Self-pay

## 2014-06-20 DIAGNOSIS — M898X5 Other specified disorders of bone, thigh: Secondary | ICD-10-CM | POA: Diagnosis present

## 2014-06-20 DIAGNOSIS — M899 Disorder of bone, unspecified: Secondary | ICD-10-CM

## 2014-06-20 LAB — PROTIME-INR
INR: 1 (ref 0.00–1.49)
PROTHROMBIN TIME: 13.3 s (ref 11.6–15.2)

## 2014-06-20 MED ORDER — FENTANYL CITRATE 0.05 MG/ML IJ SOLN
INTRAMUSCULAR | Status: AC
  Filled 2014-06-20: qty 4

## 2014-06-20 MED ORDER — HYDROCODONE-ACETAMINOPHEN 5-325 MG PO TABS
1.0000 | ORAL_TABLET | ORAL | Status: DC | PRN
  Filled 2014-06-20: qty 2

## 2014-06-20 MED ORDER — MIDAZOLAM HCL 2 MG/2ML IJ SOLN
INTRAMUSCULAR | Status: AC
  Filled 2014-06-20: qty 4

## 2014-06-20 MED ORDER — FENTANYL CITRATE 0.05 MG/ML IJ SOLN
INTRAMUSCULAR | Status: AC | PRN
  Administered 2014-06-20 (×4): 50 ug via INTRAVENOUS

## 2014-06-20 MED ORDER — MIDAZOLAM HCL 2 MG/2ML IJ SOLN
INTRAMUSCULAR | Status: AC | PRN
  Administered 2014-06-20 (×5): 1 mg via INTRAVENOUS
  Administered 2014-06-20: 2 mg via INTRAVENOUS
  Administered 2014-06-20: 1 mg via INTRAVENOUS

## 2014-06-20 MED ORDER — FENTANYL CITRATE 0.05 MG/ML IJ SOLN
INTRAMUSCULAR | Status: AC
  Filled 2014-06-20: qty 2

## 2014-06-20 MED ORDER — SODIUM CHLORIDE 0.9 % IV SOLN
INTRAVENOUS | Status: DC
  Administered 2014-06-20: 12:00:00 via INTRAVENOUS

## 2014-06-20 MED ORDER — MIDAZOLAM HCL 2 MG/2ML IJ SOLN
INTRAMUSCULAR | Status: AC
  Filled 2014-06-20: qty 8

## 2014-06-20 NOTE — H&P (Signed)
  Chief Complaint: Left femur lesion  Referring Physician(s): Brooks,Dahari  History of Present Illness: Genieve B Mauney is a 49 y.o. female with history of  left breast ductal carcinoma 2015 , left leg/hip pain and recent imaging studies showing abnormal radiotracer uptake in distal left femur/ intramedullary lesion in distal left femur. She presents today for CT guided biopsy of the left femur lesion.   Past Medical History  Diagnosis Date  . Anxiety   . COPD (chronic obstructive pulmonary disease) 2011    does not use inhaler ofter  . Depression   . Hot flashes   . Family history of anesthesia complication     Had a hard time waking father up only once after several procedures  . Shortness of breath     with exertion  . Chronic heartburn   . PVC (premature ventricular contraction)     PMH;at 49 y.o.  . Asthma     denies history of asthma  . Heart murmur     PMH: at age 20; no longer have murmur  . 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 Cancer     DCIS    Past Surgical History  Procedure Laterality Date  . Polyp removal     . Tonsillectomy    . Wisdom tooth extraction    . Robotic assisted total hysterectomy with bilateral salpingo oopherectomy Bilateral 05/24/2013    Procedure: ROBOTIC ASSISTED TOTAL HYSTERECTOMY WITH BILATERAL SALPINGECTOMY;  Surgeon: Carolyn Harraway-Smith, MD;  Location: [facility];  Service: Gynecology;  Laterality: Bilateral;  . Cystoscopy N/A 05/24/2013    Procedure: CYSTOSCOPY;  Surgeon: Carolyn Harraway-Smith, MD;  Location: [facility];  Service: Gynecology;  Laterality: N/A;  . Abdominal hysterectomy    . Breast surgery      left breast biopsy x 2  . Portacath placement Right 01/02/2014    Procedure: INSERTION PORT-A-CATH;  Surgeon: Matthew Wakefield, MD;  Location: [facility];  Service: General;  Laterality: Right;  . Radioactive seed guided mastectomy with axillary sentinel lymph node biopsy Left 05/29/2014   Procedure: RADIOACTIVE SEED GUIDED LEFT PARTIAL MASTECTOMY WITH AXILLARY SENTINEL LYMPH NODE BIOPSY;  Surgeon: Matthew Wakefield, MD;  Location: [facility];  Service: General;  Laterality: Left;    Allergies: Review of patient's allergies indicates no known allergies.  Medications: Prior to Admission medications   Medication Sig Start Date End Date Taking? Authorizing Provider  budesonide-formoterol (SYMBICORT) 160-4.5 MCG/ACT inhaler Inhale 1 puff into the lungs 2 (two) times daily as needed (for shortness of breath).    Yes Historical Provider, MD  calcium carbonate (TUMS - DOSED IN MG ELEMENTAL CALCIUM) 500 MG chewable tablet Chew 1 tablet by mouth daily.   Yes Historical Provider, MD  cyclobenzaprine (FLEXERIL) 5 MG tablet Take 1 tablet (5 mg total) by mouth 2 (two) times daily as needed for muscle spasms. 05/08/14  Yes Heather L Ferrell, NP  gabapentin (NEURONTIN) 300 MG capsule Take 1 capsule (300 mg total) by mouth 3 (three) times daily. Patient taking differently: Take 300 mg by mouth daily as needed (for legs).  06/10/14  Yes Heather L Ferrell, NP  HYDROcodone-acetaminophen (NORCO/VICODIN) 5-325 MG per tablet Take 1-2 tablets by mouth every 4 (four) hours as needed for moderate pain or severe pain. 05/29/14  Yes Matthew Wakefield, MD  ipratropium (ATROVENT HFA) 17 MCG/ACT inhaler Inhale 1 puff into the lungs every 6 (six) hours as needed for wheezing (SOB).    Yes Historical Provider,   MD  LORazepam (ATIVAN) 0.5 MG tablet Take 1 tablet (0.5 mg total) by mouth at bedtime as needed (Nausea or vomiting). Patient taking differently: Take 0.5 mg by mouth at bedtime as needed for sleep (Nausea or vomiting).  06/10/14  Yes Heather L Ferrell, NP  omeprazole (PRILOSEC) 40 MG capsule Take 1 capsule (40 mg total) by mouth daily. 05/08/14  Yes Heather L Ferrell, NP  PRESCRIPTION MEDICATION Chemo - [facility]   Yes Historical Provider, MD  zolpidem (AMBIEN) 5 MG tablet Take 1 tablet (5 mg total)  by mouth at bedtime as needed for sleep. 06/10/14  Yes Heather L Ferrell, NP  cholestyramine (QUESTRAN) 4 G packet Take 1 packet (4 g total) by mouth 3 (three) times daily. Patient not taking: Reported on 03/25/2014 02/13/14   Heather L Ferrell, NP  fluticasone (FLONASE) 50 MCG/ACT nasal spray Place 2 sprays into both nostrils daily. Patient not taking: Reported on 05/08/2014 01/27/14   Cynthia Bacon, NP  HYDROcodone-acetaminophen (NORCO/VICODIN) 5-325 MG per tablet Take 1 tablet by mouth every 6 (six) hours as needed for moderate pain. Patient not taking: Reported on 06/20/2014 05/20/14   Heather L Ferrell, NP  hydrocortisone (ANUSOL-HC) 25 MG suppository Place 1 suppository (25 mg total) rectally 2 (two) times daily. Patient not taking: Reported on 04/30/2014 04/16/14   Gustav C Magrinat, MD  hydrocortisone (PROCTOZONE-HC) 2.5 % rectal cream Place 1 application rectally 2 (two) times daily. Patient not taking: Reported on 06/20/2014 04/17/14   Gustav C Magrinat, MD  loperamide (IMODIUM) 2 MG capsule TAKE ONE TABLET AFTER EACH LOOSE STOOL. MAXIMUM OF SIX TABLETS A DAY. CHEMOTHERAPY INDUCED DIARRHEA. Patient not taking: Reported on 06/20/2014 02/14/14   Gustav C Magrinat, MD  loratadine (CLARITIN) 10 MG tablet Take 1 tablet (10 mg total) by mouth as needed. Patient not taking: Reported on 06/20/2014 03/04/14   Heather L Ferrell, NP  traZODone (DESYREL) 50 MG tablet Take 1 tablet (50 mg total) by mouth at bedtime as needed for sleep. Patient not taking: Reported on 04/15/2014 02/13/14   Heather L Ferrell, NP    Family History  Problem Relation Age of Onset  . Heart disease Mother   . Hypercholesterolemia Mother   . Heart disease Father   . COPD Father   . Hypercholesterolemia Father   . Cancer Neg Hx     History   Social History  . Marital Status: Single    Spouse Name: N/A  . Number of Children: N/A  . Years of Education: N/A   Social History Main Topics  . Smoking status: Current Every Day  Smoker -- 1.00 packs/day for 16 years    Types: Cigarettes  . Smokeless tobacco: Never Used  . Alcohol Use: Yes     Comment: occasional   . Drug Use: No  . Sexual Activity: Not Currently    Birth Control/ Protection: Surgical   Other Topics Concern  . None   Social History Narrative        Review of Systems  Constitutional: Negative for fever and chills.  Respiratory: Negative for cough and shortness of breath.   Cardiovascular: Negative for chest pain.  Gastrointestinal: Negative for nausea, vomiting, abdominal pain and blood in stool.  Genitourinary: Negative for dysuria and hematuria.  Musculoskeletal: Positive for back pain and arthralgias.  Neurological: Negative for headaches.    Vital Signs: BP 113/82 mmHg  Pulse 94  Temp(Src) 98.2 F (36.8 C) (Oral)  SpO2 98%  LMP 03/18/2013  Physical Exam  Constitutional: She   is oriented to person, place, and time. She appears well-developed and well-nourished.  Cardiovascular: Normal rate and regular rhythm.   Clean, intact rt chest wall PAC  Pulmonary/Chest: Effort normal.  Distant BS bilat  Abdominal: Soft. Bowel sounds are normal. There is no tenderness.  Musculoskeletal: Normal range of motion. She exhibits no edema.  Neurological: She is alert and oriented to person, place, and time.    Imaging: Mr Breast Bilateral W Wo Contrast  05/26/2014   CLINICAL DATA:  Patient with history of left breast invasive ductal carcinoma undergoing neoadjuvant chemotherapy. Patient subsequently underwent MRI guided core needle biopsies of the left breast for additional areas of enhancement, demonstrating benign pathology.  EXAM: BILATERAL BREAST MRI WITH AND WITHOUT CONTRAST  TECHNIQUE: Multiplanar, multisequence MR images of both breasts were obtained prior to and following the intravenous administration of 19 ml of Multihance.  THREE-DIMENSIONAL MR IMAGE RENDERING ON INDEPENDENT WORKSTATION:  Three-dimensional MR images were rendered by  post-processing of the original MR data on an independent workstation. The three-dimensional MR images were interpreted, and findings are reported in the following complete MRI report for this study. Three dimensional images were evaluated at the independent DynaCad workstation  COMPARISON:  Previous exam(s).  FINDINGS: Breast composition: d.  Extreme fibroglandular tissue.  Background parenchymal enhancement: Mild  Right breast: No mass or abnormal enhancement.  Left breast: Susceptibility artifact is demonstrated within the upper-outer left breast middle depth at the site of biopsy-proven left breast invasive ductal carcinoma. There is minimal distortion at this location without residual enhancement.  Susceptibility artifact is demonstrated within the upper inner left breast posterior depth at the site of previous benign left breast MRI biopsy.  Susceptibility artifact is demonstrated within the lower outer left breast posterior depth at the site of previous benign left breast MRI biopsy.  Small 3-5 mm post biopsy hematoma within the upper-inner left breast. , decreased in size from MRI guided core needle biopsy images.  No concerning site of enhancement identified within the left breast.  Lymph nodes: No abnormal appearing lymph nodes.  Ancillary findings:  None.  IMPRESSION: Susceptibility artifact demonstrated within the upper-outer left breast middle depth at the site of biopsy-proven left breast invasive ductal carcinoma. There is residual distortion at this location without definitive enhancement, compatible with treatment response.  RECOMMENDATION: Treatment plan for known left breast malignancy.  BI-RADS CATEGORY  6: Known biopsy-proven malignancy.   Electronically Signed   By: Lovey Newcomer M.D.   On: 05/26/2014 08:50   Nm Sentinel Node Inj-no Rpt (breast)  05/29/2014   CLINICAL DATA: left ax sn biopsy   Sulfur colloid was injected intradermally by the nuclear medicine  technologist for breast cancer  sentinel node localization.    Mm Breast Surgical Specimen  05/29/2014   CLINICAL DATA:  Patient with history of left breast invasive ductal carcinoma status post lumpectomy.  EXAM: SPECIMEN RADIOGRAPH OF THE LEFT BREAST  COMPARISON:  Previous exam(s).  FINDINGS: Status post excision of the left breast. The radioactive seed and biopsy marker clip are present, completely intact, and are marked for pathology.  IMPRESSION: Specimen radiograph of the left breast.   Electronically Signed   By: Lovey Newcomer M.D.   On: 05/29/2014 09:10   Mm Lt Radioactive Seed Loc Mammo Guide  05/28/2014   CLINICAL DATA:  INC proven left breast 2 o'clock location invasive ductal carcinoma diagnosed August 2015. Status post neoadjuvant chemotherapy. Two previous benign left breast biopsies. Preoperative radioactive seed localization.  EXAM: MAMMOGRAPHIC GUIDED  RADIOACTIVE SEED LOCALIZATION OF THE left BREAST  COMPARISON:  Previous exam(s).  FINDINGS: Patient presents for radioactive seed localization prior to lumpectomy. I met with the patient and we discussed the procedure of seed localization including benefits and alternatives. We discussed the high likelihood of a successful procedure. We discussed the risks of the procedure including infection, bleeding, tissue injury and further surgery. We discussed the low dose of radioactivity involved in the procedure. Informed, written consent was given.  The usual time-out protocol was performed immediately prior to the procedure.  Using mammographic guidance, sterile technique, 2% lidocaine and an I-125 radioactive seed, the ribbon shaped clip in the left breast 2 o'clock location was localized using a lateral to medial approach. The follow-up mammogram images confirm the seed in the expected location and are marked for Dr. Wakefield.  Follow-up survey of the patient confirms presence of the radioactive seed.  Order number of I-125 seed:  [id number].  Total activity:  0.252 mCi  Reference  Date: 05/23/2014  The patient tolerated the procedure well and was [facility]. She was given instructions regarding seed removal.  IMPRESSION: Radioactive seed localization left breast. No apparent complications.   Electronically Signed   By: Gretchen  Green M.D.   On: 05/28/2014 10:26    Labs:  CBC:  Recent Labs  04/30/14 0903 05/08/14 1315 05/20/14 0901 06/10/14 0850  WBC 8.2 9.6 4.4 4.1  HGB 11.0* 11.7 10.6* 12.7  HCT 33.1* 35.3 31.8* 37.3  PLT 121* 159 90* 147    COAGS:  Recent Labs  06/20/14 1107  INR 1.00    BMP:  Recent Labs  12/31/13 1448  04/30/14 0903 05/08/14 1316 05/20/14 0901 06/10/14 0850  NA 139  < > 142 139 143 141  K 4.6  < > 3.8 3.8 3.7 3.7  CL 103  --   --   --   --   --   CO2 24  < > 24 23 27 25  GLUCOSE 93  < > 140 118 106 107  BUN 9  < > 10.0 6.7* 8.1 11.3  CALCIUM 8.8  < > 9.1 9.0 9.1 9.8  CREATININE 0.83  < > 0.8 0.8 0.8 0.8  GFRNONAA 83*  --   --   --   --   --   GFRAA >90  --   --   --   --   --   < > = values in this interval not displayed.  LIVER FUNCTION TESTS:  Recent Labs  04/30/14 0903 05/08/14 1316 05/20/14 0901 06/10/14 0850  BILITOT 0.52 0.34 0.65 0.49  AST 13 14 18 20  ALT 17 21 22 22  ALKPHOS 66 87 59 66  PROT 6.3* 6.5 5.8* 6.5  ALBUMIN 3.7 3.9 3.5 4.0    TUMOR MARKERS: No results for input(s): AFPTM, CEA, CA199, CHROMGRNA in the last 8760 hours.  Assessment and Plan: Lynix B Mauney is a 48 y.o. female with history of  left breast ductal carcinoma 2015 , left leg/hip pain and recent imaging studies showing abnormal radiotracer uptake in distal left femur/ intramedullary lesion in distal left femur. She presents today for CT guided biopsy of the left femur lesion. Risks and Benefits discussed with the patient including, but not limited to bleeding, infection, damage to adjacent structures or low yield requiring additional tests. All of the patient's questions were answered, patient is  agreeable to proceed.Consent signed .       Signed: ALLRED,D KEVIN   06/20/2014, 12:29 PM   I spent a total of 20 minutes face to face in clinical consultation, greater than 50% of which was counseling/coordinating care for CT guided left femur lesion biopsy   

## 2014-06-20 NOTE — Procedures (Signed)
Interventional Radiology Procedure Note  Procedure: CT guided core biopsy LEFT distal femur  Complications:  None  Estimated Blood Loss: < 25 mL  Recommendations: - Bedrest 2 hrs  Signed,  Criselda Peaches, MD

## 2014-06-20 NOTE — Discharge Instructions (Signed)
Bone Biopsy, Needle, Care After  Read the instructions outlined below and refer to this sheet in the next few weeks. These discharge instructions provide you with general information on caring for yourself after you leave the hospital. Your caregiver may also give you specific instructions. While your treatment has been planned according to the most current medical practices available, unavoidable complications sometimes occur. If you have any problems or questions after discharge, call your caregiver.  Finding out the results of your test  Not all test results are available during your visit. If your test results are not back during the visit, make an appointment with your caregiver to find out the results. Do not assume everything is normal if you have not heard from your caregiver or the medical facility. It is important for you to follow up on all of your test results.   SEEK MEDICAL CARE IF:    You have redness, swelling, or increasing pain at the site of the biopsy.   You have pus coming from the biopsy site.   You have drainage from the biopsy site lasting longer than 1 day.   You notice a bad smell coming from the biopsy site or dressing.   You develop persistent nausea or vomiting.  SEEK IMMEDIATE MEDICAL CARE IF:   You have a fever.   You develop a rash.   You have difficulty breathing.   You develop any reaction or side effects to medicines given.  Document Released: 10/22/2004 Document Revised: 01/23/2013 Document Reviewed: 09/09/2008  ExitCare Patient Information 2015 [employer]. This information is not intended to replace advice given to you by your health care provider. Make sure you discuss any questions you have with your health care provider.        Conscious Sedation  Sedation is the use of medicines to promote relaxation and relieve discomfort and anxiety. Conscious sedation is a type of sedation. Under conscious sedation you are less alert than normal but are still able to respond  to instructions or stimulation. Conscious sedation is used during short medical and dental procedures. It is milder than deep sedation or general anesthesia and allows you to return to your regular activities sooner.   LET YOUR HEALTH CARE PROVIDER KNOW ABOUT:    Any allergies you have.   All medicines you are taking, including vitamins, herbs, eye drops, creams, and over-the-counter medicines.   Use of steroids (by mouth or creams).   Previous problems you or members of your family have had with the use of anesthetics.   Any blood disorders you have.   Previous surgeries you have had.   Medical conditions you have.   Possibility of pregnancy, if this applies.   Use of cigarettes, alcohol, or illegal drugs.  RISKS AND COMPLICATIONS  Generally, this is a safe procedure. However, as with any procedure, problems can occur. Possible problems include:   Oversedation.   Trouble breathing on your own. You may need to have a breathing tube until you are awake and breathing on your own.   Allergic reaction to any of the medicines used for the procedure.  BEFORE THE PROCEDURE   You may have blood tests done. These tests can help show how well your kidneys and liver are working. They can also show how well your blood clots.   A physical exam will be done.   Only take medicines as directed by your health care provider. You may need to stop taking medicines (such as blood thinners, aspirin,   or nonsteroidal anti-inflammatory drugs) before the procedure.    Do not eat or drink at least 6 hours before the procedure or as directed by your health care provider.   Arrange for a responsible adult, family member, or friend to take you home after the procedure. He or she should stay with you for at least 24 hours after the procedure, until the medicine has worn off.  PROCEDURE    An intravenous (IV) catheter will be inserted into one of your veins. Medicine will be able to flow directly into your body through this  catheter. You may be given medicine through this tube to help prevent pain and help you relax.   The medical or dental procedure will be done.  AFTER THE PROCEDURE   You will stay in a recovery area until the medicine has worn off. Your blood pressure and pulse will be checked.    Depending on the procedure you had, you may be allowed to go home when you can tolerate liquids and your pain is under control.  Document Released: 12/28/2000 Document Revised: 04/09/2013 Document Reviewed: 12/10/2012  ExitCare Patient Information 2015 [employer]. This information is not intended to replace advice given to you by your health care provider. Make sure you discuss any questions you have with your health care provider.

## 2014-06-23 ENCOUNTER — Ambulatory Visit: Payer: Medicaid Other | Attending: General Surgery | Admitting: Physical Therapy

## 2014-06-23 DIAGNOSIS — Z9189 Other specified personal risk factors, not elsewhere classified: Secondary | ICD-10-CM | POA: Insufficient documentation

## 2014-06-23 DIAGNOSIS — C50412 Malignant neoplasm of upper-outer quadrant of left female breast: Secondary | ICD-10-CM | POA: Insufficient documentation

## 2014-06-23 DIAGNOSIS — M25612 Stiffness of left shoulder, not elsewhere classified: Secondary | ICD-10-CM | POA: Insufficient documentation

## 2014-06-23 NOTE — Patient Instructions (Signed)
External Rotator Cuff Stretch, Standing   Stand, palm against door frame and elbow bent at 90. Turn body away from fixed hand allowing shoulder to come forward. Hold _5__ seconds.  Repeat _10__ times per session. Do _2-3__ sessions per day.  Copyright  VHI. All rights reserved.  Inferior Capsule Stick Stretch I   Stand or sit, dowel in palm of arm to be stretched. Other arm, holding dowel in front of body, pushes outward and upward until arm being stretched is as high to the side as possible. Hold _5__ seconds. Repeat _10__ times per session. Do _2-3__ sessions per day.  Copyright  VHI. All rights reserved.

## 2014-06-23 NOTE — Therapy (Signed)
[place], [place], [address] Phone: [phone]   Fax:  [phone]  Physical Therapy Evaluation  Patient Details  Name: Jessica [name] MRN: [id number] Date of Birth: [birth year] Referring Provider:  [name], MD  Encounter Date: 06/23/2014      PT End of Session - 06/23/14 1627    Visit Number 1   Number of Visits 1   PT Start Time 1521   PT Stop Time 1287   PT Time Calculation (min) 53 min   Activity Tolerance Patient tolerated treatment well   Behavior During Therapy [facility] for tasks assessed/performed      Past Medical History  Diagnosis Date  . Anxiety   . COPD (chronic obstructive pulmonary disease) 2011    does not use inhaler ofter  . Depression   . Hot flashes   . Family history of anesthesia complication     Had a hard time waking father up only once after several procedures  . Shortness of breath     with exertion  . Chronic heartburn   . PVC (premature ventricular contraction)     PMH;at 49 y.o.  . Asthma     denies history of asthma  . Heart murmur     PMH: at age 28; no longer have murmur  . 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 Cancer     DCIS    Past Surgical History  Procedure Laterality Date  . Polyp removal     . Tonsillectomy    . Wisdom tooth extraction    . Robotic assisted total hysterectomy with bilateral salpingo oopherectomy Bilateral 05/24/2013    Procedure: ROBOTIC ASSISTED TOTAL HYSTERECTOMY WITH BILATERAL SALPINGECTOMY;  Surgeon: Lavonia Drafts, MD;  Location: [facility];  Service: Gynecology;  Laterality: Bilateral;  . Cystoscopy N/A 05/24/2013    Procedure: CYSTOSCOPY;  Surgeon: Lavonia Drafts, MD;  Location: [facility];  Service: Gynecology;  Laterality: N/A;  . Abdominal hysterectomy    . Breast surgery      left breast biopsy x 2  . Portacath placement Right 01/02/2014    Procedure: INSERTION  PORT-A-CATH;  Surgeon: [name], MD;  Location: [facility];  Service: General;  Laterality: Right;  . Radioactive seed guided mastectomy with axillary sentinel lymph node biopsy Left 05/29/2014    Procedure: RADIOACTIVE SEED GUIDED LEFT PARTIAL MASTECTOMY WITH AXILLARY SENTINEL LYMPH NODE BIOPSY;  Surgeon: [name], MD;  Location: [facility];  Service: General;  Laterality: Left;    LMP 03/18/2013  Visit Diagnosis:  Stiffness of joint, shoulder region, left - Plan: PT plan of care cert/re-cert  At risk for lymphedema - Plan: PT plan of care cert/re-cert      Subjective Assessment - 06/23/14 1524    Pertinent History Diagnosed with breast cancer in August 2015; finished "harsh" chemos.  Had lumpectomy three weeks ago; two lymph nodes removed, both negative.  Had bad back pain and went to [facility] and had bone scan; and had leg and back light up.  Then had MRI on both areas, on the back, it was determined to be degenerative changes, and the leg she'll be finding out about tomorrow.  She was told it was cancer before the biopsy was returned.  Is to start radiation for the breast, but recently had the death of her significan't other's father occur. Significant other had a stroke six weeks ago.  PT. injured her thumb and left  shoulder some at that time.   Currently in Pain? Yes   Pain Score 4    Pain Location Other (Comment)  "every joint--elbows, shoulders, legs, crease of leg"; left thumb also hurts   Pain Descriptors / Indicators Sharp;Dull;Tender   Aggravating Factors  nothing; happens when she's sleeping for the left thumb--it locks up   Pain Relieving Factors pain pill   Multiple Pain Sites Yes          [facility] PT Assessment - 06/23/14 0001    Assessment   Medical Diagnosis left breast cancer s/p lumpectomy   Onset Date 05/29/14   Precautions   Precautions Other (comment)   Precaution Comments cancer precautions; possible left femur bone mets    Restrictions   Weight Bearing Restrictions No   Balance Screen   Has the patient fallen in the past 6 months No   Has the patient had a decrease in activity level because of a fear of falling?  No   Is the patient reluctant to leave their home because of a fear of falling?  No   Home Environment   Living Enviornment Private residence   Living Arrangements Spouse/significant other   Available Help at Discharge --  significant other needs patient's help due to recent stroke   [place] to enter   Entrance Stairs-Number of Steps 3   Additional Comments spouse with left side weakness from CVA; uses walker, brace, and has limited use of left UE   Prior Function   Level of Independence Independent with basic ADLs;Independent with homemaking with ambulation;Independent with gait   Vocation Other (comment)  on leave; may be going back soon, but depends on leg diagnos   Pharmacist, 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  Pain at base of left thumb and visible swelling there--pt. will ask orthopedist about this.   Skin Integrity two incisions (tumor and node) healing well, but lump incision with small scab at lateral aspect (that incision at approx. 3 o'clock on left breast)   Other Surveys  Select   Posture/Postural Control   Posture/Postural Control Postural limitations   Postural Limitations Rounded Shoulders;Forward head   ROM / Strength   AROM / PROM / Strength AROM;Strength   AROM   AROM Assessment Site Shoulder   Right/Left Shoulder Right;Left   Right Shoulder Extension 48 Degrees   Right Shoulder Flexion 145 Degrees   Right Shoulder ABduction 167 Degrees   Right Shoulder Internal Rotation 68 Degrees   Right Shoulder External Rotation 83 Degrees   Left Shoulder Extension 53 Degrees    Left Shoulder Flexion 145 Degrees   Left Shoulder ABduction 158 Degrees   Left Shoulder Internal Rotation 70 Degrees   Left Shoulder External Rotation 77 Degrees           LYMPHEDEMA/ONCOLOGY QUESTIONNAIRE - 06/23/14 1548    Lymphedema Assessments   Lymphedema Assessments Upper extremities   Right Upper Extremity Lymphedema   10 cm Proximal to Olecranon Process 32.2 cm   Olecranon Process 28.1 cm   10 cm Proximal to Ulnar Styloid Process 26.5 cm   Just Proximal to Ulnar Styloid Process 18.5 cm   Across Hand at [employer] 19.9 cm   At [place] of 2nd Digit 7.1 cm   Left Upper Extremity Lymphedema   10 cm Proximal to Olecranon Process 32.8  cm   Olecranon Process 28.3 cm   10 cm Proximal to Ulnar Styloid Process 25.5 cm   Just Proximal to Ulnar Styloid Process 18.2 cm   Across Hand at [employer] 19.2 cm   At [employer] of 2nd Digit 6.8 cm           Quick Dash - 06/23/14 0001    Open a tight or new jar Mild difficulty   Do heavy household chores (wash walls, wash floors) Mild difficulty   Carry a shopping bag or briefcase Mild difficulty   Wash your back Mild difficulty   Use a knife to cut food No difficulty   Recreational activities in which you take some force or impact through your arm, shoulder, or hand (golf, hammering, tennis) Mild difficulty   During the past week, to what extent has your arm, shoulder or hand problem interfered with your normal social activities with family, friends, neighbors, or groups? Not at all   During the past week, to what extent has your arm, shoulder or hand problem limited your work or other regular daily activities Slightly   Arm, shoulder, or hand pain. Mild   Tingling (pins and needles) in your arm, shoulder, or hand Mild   Difficulty Sleeping Mild difficulty   DASH Score 20.45 %                    PT Education - 06/23/14 1626    Education Details Pt. to continue with hands clasped bilateral shoulder flexion and hands  on hips scapular retraction, 5 counts x 10 each, 3x/day.  Also to do dowel stretch for left shoulder abduction and doorway stretch for left shoulder ER, per instructions given. Gave info about attending ABC class.   Methods Explanation;Handout   Comprehension Verbalized understanding;Returned demonstration                [facility] Goals - 06/23/14 1632    CC Long Term Goal  #1   Title Pt. will be knowledgeable about home exercise program for left shoulder ROM.   Time 1   Period Days   Status Achieved   CC Long Term Goal  #2   Title Pt. will be knowledgeable about lymphedema risk reduction.   Baseline No knowledge; should be able to achieve this goal by attending next After Breast Cancer class.   Time 2   Period Weeks   Status New            Plan - 06/23/14 1628    Clinical Impression Statement Patient s/p lumpectomy with just mildly limited left shoulder ROM should do well with a home exercise program and coming to ABC class.   Pt will benefit from skilled therapeutic intervention in order to improve on the following deficits Decreased range of motion;Decreased knowledge of precautions   Rehab Potential Excellent   PT Frequency One time visit   PT Treatment/Interventions Patient/family education;Therapeutic exercise   PT Next Visit Plan None at this time; patient to follow home exercise program instructions and notify us/physician if she identifies further needs.   PT Home Exercise Plan left shoulder ROM   Recommended Other Services attend ABC class; ask orthopedist about left thumb injury   Consulted and Agree with Plan of Care Patient         Problem List Patient Active Problem List   Diagnosis Date Noted  . Lytic bone lesion of left femur   . Low back pain 03/25/2014  .  Constipation 03/25/2014  . Thrush 03/04/2014  . Spasm of back muscles 03/04/2014  . Insomnia 02/13/2014  . UTI (urinary tract infection) 01/27/2014  . Cerumen impaction 01/27/2014   . Diarrhea 01/27/2014  . Sinusitis 01/20/2014  . Acute sinusitis 01/20/2014  . Rash 01/20/2014  . Excessive cerumen in both ear canals 01/20/2014  . Oral mucositis due to antineoplastic therapy 01/20/2014  . Breast cancer of upper-outer quadrant of left female breast 12/03/2013  . Postoperative infection 06/04/2013  . HYPERTRIGLYCERIDEMIA 10/07/2009  . MENORRHAGIA 10/07/2009  . TOBACCO ABUSE 06/22/2009  . OVARIAN CYST 06/22/2009  . ELEVATED BLOOD PRESSURE WITHOUT DIAGNOSIS OF HYPERTENSION 06/22/2009  . CHRONIC OBSTRUCTIVE PULMONARY DISEASE 06/11/2009    Dimitrius Steedman 06/23/2014, 4:38 PM  [place], [place], [address] Phone: [phone]   Fax:  [place], PT 06/23/2014 4:38 PM

## 2014-06-30 ENCOUNTER — Encounter: Payer: Self-pay | Admitting: Radiation Oncology

## 2014-06-30 ENCOUNTER — Ambulatory Visit
Admission: RE | Admit: 2014-06-30 | Discharge: 2014-06-30 | Disposition: A | Discharge status: Home / Self Care | Payer: Medicaid Other | Source: Ambulatory Visit | Attending: Radiation Oncology | Admitting: Radiation Oncology

## 2014-06-30 VITALS — BP 134/83 | HR 74 | Temp 98.4°F | Resp 20 | Ht 65.0 in | Wt 199.2 lb

## 2014-06-30 DIAGNOSIS — G893 Neoplasm related pain (acute) (chronic): Secondary | ICD-10-CM | POA: Diagnosis not present

## 2014-06-30 DIAGNOSIS — C50412 Malignant neoplasm of upper-outer quadrant of left female breast: Secondary | ICD-10-CM

## 2014-06-30 DIAGNOSIS — Z510 Encounter for antineoplastic radiation therapy: Secondary | ICD-10-CM | POA: Diagnosis not present

## 2014-06-30 DIAGNOSIS — D1622 Benign neoplasm of long bones of left lower limb: Secondary | ICD-10-CM | POA: Diagnosis not present

## 2014-06-30 DIAGNOSIS — Z79891 Long term (current) use of opiate analgesic: Secondary | ICD-10-CM | POA: Diagnosis not present

## 2014-06-30 DIAGNOSIS — L599 Disorder of the skin and subcutaneous tissue related to radiation, unspecified: Secondary | ICD-10-CM | POA: Diagnosis not present

## 2014-06-30 DIAGNOSIS — Z79899 Other long term (current) drug therapy: Secondary | ICD-10-CM | POA: Diagnosis not present

## 2014-06-30 NOTE — Progress Notes (Signed)
Please see the Nurse Progress Note in the MD Initial Consult Encounter for this patient. 

## 2014-06-30 NOTE — Progress Notes (Signed)
Radiation Oncology         [phone] ________________________________  Name: Jessica [name] MRN: [id number]  Date: 06/30/2014  DOB: April 24, 1965  Follow-Up Visit Note  CC: [name], MD  Magrinat, Virgie Dad, MD  Diagnosis:    Breast cancer of upper-outer quadrant of left female breast   Staging form: Breast, AJCC 7th Edition     Clinical: Stage IIA (T2, N0, cM0) - Unsigned       Staging comments: Staged at breast conference 12/11/13.      Pathologic stage from 05/30/2014: Stage IA (yT1c, N0, cM0) - Signed by Seward Grater, MD on 06/02/2014       Staging comments: Staged on final lumpectomy specimen by Dr. Donato Heinz.     Narrative:  The patient returns today for routine follow-up.  The patient was originally seen in multidisciplinary clinic and was felt to be a good candidate for breast conservation treatment potentially after undergoing neoadjuvant chemotherapy. She did have additional biopsies after I saw her in clinic in these did not return positive for additional malignancy. She states she has done well through her chemotherapy. She recently underwent a lumpectomy with the pathologic findings as above. This procedure went well with negative margins. No lymph nodes involved with carcinoma.  The patient also had a finding in the left femur for which she underwent a recent biopsy. This returned positive for a enchondroma. She is scheduled to see a physician in orthopedics at [facility] in the near future for further management of this.                              ALLERGIES:  has No Known Allergies.  Meds: Current Outpatient Prescriptions  Medication Sig Dispense Refill  . budesonide-formoterol (SYMBICORT) 160-4.5 MCG/ACT inhaler Inhale 1 puff into the lungs 2 (two) times daily as needed (for shortness of breath).     . calcium carbonate (TUMS - DOSED IN MG ELEMENTAL CALCIUM) 500 MG chewable tablet Chew 1 tablet by mouth daily.    . cyclobenzaprine (FLEXERIL) 5 MG tablet Take 1  tablet (5 mg total) by mouth 2 (two) times daily as needed for muscle spasms. 60 tablet 1  . gabapentin (NEURONTIN) 300 MG capsule Take 1 capsule (300 mg total) by mouth 3 (three) times daily. (Patient taking differently: Take 300 mg by mouth daily as needed (for legs). ) 90 capsule 3  . HYDROcodone-acetaminophen (NORCO/VICODIN) 5-325 MG per tablet Take 1 tablet by mouth every 6 (six) hours as needed for moderate pain. 60 tablet 0  . HYDROcodone-acetaminophen (NORCO/VICODIN) 5-325 MG per tablet Take 1-2 tablets by mouth every 4 (four) hours as needed for moderate pain or severe pain. 40 tablet 0  . hydrocortisone (ANUSOL-HC) 25 MG suppository Place 1 suppository (25 mg total) rectally 2 (two) times daily. 24 suppository 1  . ipratropium (ATROVENT HFA) 17 MCG/ACT inhaler Inhale 1 puff into the lungs every 6 (six) hours as needed for wheezing (SOB).     . LORazepam (ATIVAN) 0.5 MG tablet Take 1 tablet (0.5 mg total) by mouth at bedtime as needed (Nausea or vomiting). (Patient taking differently: Take 0.5 mg by mouth at bedtime as needed for sleep (Nausea or vomiting). ) 30 tablet 0  . omeprazole (PRILOSEC) 40 MG capsule Take 1 capsule (40 mg total) by mouth daily. 30 capsule 2  . PRESCRIPTION MEDICATION Chemo - [facility]    . zolpidem (AMBIEN) 5 MG tablet Take  1 tablet (5 mg total) by mouth at bedtime as needed for sleep. 90 tablet 3  . cholestyramine (QUESTRAN) 4 G packet Take 1 packet (4 g total) by mouth 3 (three) times daily. (Patient not taking: Reported on 03/25/2014) 60 each 3  . fluticasone (FLONASE) 50 MCG/ACT nasal spray Place 2 sprays into both nostrils daily. (Patient not taking: Reported on 06/30/2014) 16 g 2  . hydrocortisone (PROCTOZONE-HC) 2.5 % rectal cream Place 1 application rectally 2 (two) times daily. (Patient not taking: Reported on 06/20/2014) 30 g 0  . loperamide (IMODIUM) 2 MG capsule TAKE ONE TABLET AFTER EACH LOOSE STOOL. MAXIMUM OF SIX TABLETS A DAY. CHEMOTHERAPY INDUCED DIARRHEA.  (Patient not taking: Reported on 06/20/2014) 60 capsule 3  . loratadine (CLARITIN) 10 MG tablet Take 1 tablet (10 mg total) by mouth as needed. (Patient not taking: Reported on 06/30/2014) 90 tablet 1  . traZODone (DESYREL) 50 MG tablet Take 1 tablet (50 mg total) by mouth at bedtime as needed for sleep. (Patient not taking: Reported on 06/30/2014) 30 tablet 3   No current facility-administered medications for this encounter.    Physical Findings: The patient is in no acute distress. Patient is alert and oriented.  height is 5\' 5"  (1.651 m) and weight is 199 lb 3.2 oz (90.357 kg). Her oral temperature is 98.4 F (36.9 C). Her blood pressure is 134/83 and her pulse is 74. Her respiration is 20. .   The surgical incision is well healed at this time with within the breast and the axilla  Lab Findings: Lab Results  Component Value Date   WBC 4.1 06/10/2014   HGB 12.7 06/10/2014   HCT 37.3 06/10/2014   MCV 101.6* 06/10/2014   PLT 147 06/10/2014     Radiographic Findings: Ct Biopsy  06/20/2014   CLINICAL DATA:  49 year old female with a history of breast cancer and mixed lucent and intra medullary lesion with activity on nuclear medicine bone scan in the distal left femoral diaphysis. Findings may represent benign or malignant chondroid lesion, or less likely breast cancer metastasis.  EXAM: CT BIOPSY  Date: 06/20/2014  PROCEDURE: 1. CT-guided biopsy of left distal femoral lesion Interventional Radiologist:  Criselda Peaches, MD  ANESTHESIA/SEDATION: Moderate (conscious) sedation was used. 8 mg Versed, 200 mcg Fentanyl were administered intravenously. [name] vital signs were monitored continuously by radiology nursing throughout the procedure.  Sedation Time: 23 minutes  MEDICATIONS: None additional  TECHNIQUE: Informed consent was obtained from the patient following explanation of the procedure, risks, benefits and alternatives. The patient understands, agrees and consents for the procedure.  All questions were addressed. A time out was performed.  Maximal barrier sterile technique utilized including caps, mask, sterile gowns, sterile gloves, large sterile drape, hand hygiene, and Betadine skin prep.  A planning axial CT scan was performed. There is a lesion in the medullary space of the distal femur with slight erosion of the inner table superiorly. There is evidence of a chondroid matrix within the structure. A suitable skin entry site was selected and marked. The region was then sterilely prepped and draped in standard fashion using Betadine skin prep. Local anesthesia was attained by infiltration with 1% lidocaine. Copious anesthesia was obtained of the periosteum.  A small dermatotomy was made. An 11 gauge bone needle was then advanced and docked on the femoral surface. Positioning was confirmed by CT fluoroscopic imaging. Using the on Control drill, the drill was advanced just through the cortex. The inter cannula was then  used to take multiple samples of the medullary space. Samples were placed in formalin and delivered to pathology for further analysis.  The needles were removed. Hemostasis was attained by gentle manual pressure. The patient tolerated the procedure well.  COMPLICATIONS: None  IMPRESSION: 1. CT imaging and demonstrates chondroid matrix within the distal left femoral lesion most suggestive of either a benign, or malignant chondroid lesion. No overtly aggressive or malignant imaging features are noted. There is very mild endosteal erosion in 1 location. This was targeted for biopsy. 2. Successful CT-guided core biopsy of intra medullary distal left femoral diaphyseal lesion. Signed,  Criselda Peaches, MD  [facility]  [facility]   Electronically Signed   By: Jacqulynn Cadet M.D.   On: 06/20/2014 17:09    Impression:    The patient at this time is suitable in my opinion to proceed with adjuvant radiation treatment to the left  breast. I discussed with the patient once again the potential benefit of such a treatment as well as the possible side effects and risks. All of her questions were answered. She does wish to proceed with this treatment.  Plan:  The patient will undergo a simulation in the near future such that she can proceed with treatment planning. I anticipate treating the patient to the left breast using whole breast tangent techniques for a total of 6-1/2 weeks.  I spent 25 minutes with the patient today, the majority of which was spent counseling the patient on the diagnosis of cancer and coordinating care.   Jodelle Gross, M.D., Ph.D.

## 2014-07-01 NOTE — Addendum Note (Signed)
Encounter addended by: Doreen Beam, RN on: 07/01/2014 10:08 AM<BR>     Documentation filed: Charges VN

## 2014-07-02 ENCOUNTER — Other Ambulatory Visit (HOSPITAL_BASED_OUTPATIENT_CLINIC_OR_DEPARTMENT_OTHER): Payer: Medicaid Other

## 2014-07-02 ENCOUNTER — Telehealth: Payer: Self-pay | Admitting: Oncology

## 2014-07-02 ENCOUNTER — Ambulatory Visit (HOSPITAL_BASED_OUTPATIENT_CLINIC_OR_DEPARTMENT_OTHER): Payer: Medicaid Other

## 2014-07-02 ENCOUNTER — Ambulatory Visit (HOSPITAL_BASED_OUTPATIENT_CLINIC_OR_DEPARTMENT_OTHER): Payer: Medicaid Other | Admitting: Oncology

## 2014-07-02 ENCOUNTER — Ambulatory Visit: Payer: Medicaid Other

## 2014-07-02 ENCOUNTER — Other Ambulatory Visit: Payer: Medicaid Other

## 2014-07-02 VITALS — BP 137/76 | HR 70 | Temp 97.9°F | Resp 20 | Ht 65.0 in | Wt 198.0 lb

## 2014-07-02 DIAGNOSIS — C50412 Malignant neoplasm of upper-outer quadrant of left female breast: Secondary | ICD-10-CM

## 2014-07-02 DIAGNOSIS — D169 Benign neoplasm of bone and articular cartilage, unspecified: Secondary | ICD-10-CM

## 2014-07-02 DIAGNOSIS — Z95828 Presence of other vascular implants and grafts: Secondary | ICD-10-CM

## 2014-07-02 DIAGNOSIS — Z720 Tobacco use: Secondary | ICD-10-CM

## 2014-07-02 DIAGNOSIS — F17200 Nicotine dependence, unspecified, uncomplicated: Secondary | ICD-10-CM

## 2014-07-02 DIAGNOSIS — Z5112 Encounter for antineoplastic immunotherapy: Secondary | ICD-10-CM

## 2014-07-02 LAB — CBC WITH DIFFERENTIAL/PLATELET
BASO%: 0.6 % (ref 0.0–2.0)
Basophils Absolute: 0 10*3/uL (ref 0.0–0.1)
EOS%: 8.3 % — ABNORMAL HIGH (ref 0.0–7.0)
Eosinophils Absolute: 0.5 10*3/uL (ref 0.0–0.5)
HEMATOCRIT: 39.9 % (ref 34.8–46.6)
HGB: 13.3 g/dL (ref 11.6–15.9)
LYMPH#: 1.2 10*3/uL (ref 0.9–3.3)
LYMPH%: 19.3 % (ref 14.0–49.7)
MCH: 33.1 pg (ref 25.1–34.0)
MCHC: 33.4 g/dL (ref 31.5–36.0)
MCV: 99 fL (ref 79.5–101.0)
MONO#: 0.3 10*3/uL (ref 0.1–0.9)
MONO%: 4.9 % (ref 0.0–14.0)
NEUT#: 4.3 10*3/uL (ref 1.5–6.5)
NEUT%: 66.9 % (ref 38.4–76.8)
Platelets: 160 10*3/uL (ref 145–400)
RBC: 4.03 10*6/uL (ref 3.70–5.45)
RDW: 14.5 % (ref 11.2–14.5)
WBC: 6.4 10*3/uL (ref 3.9–10.3)

## 2014-07-02 LAB — COMPREHENSIVE METABOLIC PANEL (CC13)
ALBUMIN: 3.9 g/dL (ref 3.5–5.0)
ALK PHOS: 68 U/L (ref 40–150)
ALT: 21 U/L (ref 0–55)
AST: 18 U/L (ref 5–34)
Anion Gap: 11 mEq/L (ref 3–11)
BUN: 8 mg/dL (ref 7.0–26.0)
CO2: 25 mEq/L (ref 22–29)
Calcium: 9.6 mg/dL (ref 8.4–10.4)
Chloride: 105 mEq/L (ref 98–109)
Creatinine: 0.8 mg/dL (ref 0.6–1.1)
EGFR: 83 mL/min/{1.73_m2} — AB (ref >90)
GLUCOSE: 106 mg/dL (ref 70–140)
POTASSIUM: 4 meq/L (ref 3.5–5.1)
Sodium: 141 mEq/L (ref 136–145)
TOTAL PROTEIN: 6.8 g/dL (ref 6.4–8.3)
Total Bilirubin: 0.44 mg/dL (ref 0.20–1.20)

## 2014-07-02 MED ORDER — SODIUM CHLORIDE 0.9 % IJ SOLN
10.0000 mL | INTRAMUSCULAR | Status: DC | PRN
  Administered 2014-07-02: 10 mL
  Filled 2014-07-02: qty 10

## 2014-07-02 MED ORDER — ACETAMINOPHEN 325 MG PO TABS
ORAL_TABLET | ORAL | Status: AC
  Filled 2014-07-02: qty 2

## 2014-07-02 MED ORDER — SODIUM CHLORIDE 0.9 % IV SOLN
Freq: Once | INTRAVENOUS | Status: AC
  Administered 2014-07-02: 15:00:00 via INTRAVENOUS

## 2014-07-02 MED ORDER — HEPARIN SOD (PORK) LOCK FLUSH 100 UNIT/ML IV SOLN
500.0000 [IU] | Freq: Once | INTRAVENOUS | Status: AC
  Administered 2014-07-02: 500 [IU] via INTRAVENOUS
  Filled 2014-07-02: qty 5

## 2014-07-02 MED ORDER — DIPHENHYDRAMINE HCL 25 MG PO CAPS
25.0000 mg | ORAL_CAPSULE | Freq: Once | ORAL | Status: AC
  Administered 2014-07-02: 25 mg via ORAL

## 2014-07-02 MED ORDER — DIPHENHYDRAMINE HCL 25 MG PO CAPS
ORAL_CAPSULE | ORAL | Status: AC
  Filled 2014-07-02: qty 1

## 2014-07-02 MED ORDER — SODIUM CHLORIDE 0.9 % IJ SOLN
10.0000 mL | INTRAMUSCULAR | Status: DC | PRN
  Administered 2014-07-02: 10 mL via INTRAVENOUS
  Filled 2014-07-02: qty 10

## 2014-07-02 MED ORDER — TRASTUZUMAB CHEMO INJECTION 440 MG
6.0000 mg/kg | Freq: Once | INTRAVENOUS | Status: AC
  Administered 2014-07-02: 546 mg via INTRAVENOUS
  Filled 2014-07-02: qty 26

## 2014-07-02 MED ORDER — HEPARIN SOD (PORK) LOCK FLUSH 100 UNIT/ML IV SOLN
500.0000 [IU] | Freq: Once | INTRAVENOUS | Status: AC | PRN
  Administered 2014-07-02: 500 [IU]
  Filled 2014-07-02: qty 5

## 2014-07-02 MED ORDER — ACETAMINOPHEN 325 MG PO TABS
650.0000 mg | ORAL_TABLET | Freq: Once | ORAL | Status: AC
  Administered 2014-07-02: 650 mg via ORAL

## 2014-07-02 NOTE — Progress Notes (Signed)
[place]  Telephone:([phone]) [phone] Fax:(336) [phone]     ID: CLESSIE KARRAS DOB: 02/19/1966  MR#: [id number]  VHQ#:[id number]  Patient Care Team: [name], MD as PCP - General (Nurse Practitioner) [name], MD as Consulting Physician (General Surgery) [name], MD as Consulting Physician (Oncology) Kyung Rudd, MD as Consulting Physician (Radiation Oncology)  OTHER: Melina Schools MD, Philis Fendt MD  CHIEF COMPLAINT: Newly diagnosed triple positive breast cancer  CURRENT TREATMENT: Herceptin; radiation  BREAST CANCER HISTORY: From the original intake note:  Amali herself palpated a mass in her left breast. She brought it to the attention of [name] at the health department and she was set up for unilateral left mammography and ultrasound 11/27/2013 at the [facility] Center. This showed a possible distortion in the left breast upper outer quadrant. However the patient's breasts are density category D. On exam there was a firm nodule or mass in the left breast 2:00 location which by ultrasound was irregular and hypoechoic and measured 1.5 cm. There was no left axillary lymphadenopathy noted.  Biopsy of the mass in question 11/29/2013 showed ([id number]) an invasive ductal carcinoma, grade 1, estrogen receptor 94% positive, progesterone receptor 94% positive, both with strong staining intensity, with an MIB-1 of 46%, and HER-2 amplified by CISH, the signals ratio of 4.85 and the number per cell 6.55.  : 12/08/2013 the patient underwent bilateral breast MRI. This showed in the left breast upper outer quadrant a 2.9 cm irregular enhancing mass and extending posteriorly from this an area of clumped non-masslike enhancement extending approximately 6 cm and leading to a posterior group of nodules which were small but measured in aggregate 2.2 cm. In addition, there was a separate 0.8 cm irregular spiculated enhancing mass in the upper inner aspect  of the left breast. In the lower outer left breast there was a 0.8 cm oval enhancing mass. There were no abnormal appearing lymph nodes.  [name] subsequent history is as detailed below  INTERVAL HISTORY: Anamarie returns today for follow up of her breast cancer accompanied by her significant other Leonides Cave unfortunately has had a right body stroke earlier this year; he is making a good recovery]. Since her last visit with me she completed her chemotherapy and started every 3 week trastuzumab. She also underwent left lumpectomy and sentinel lymph node sampling 05/29/2014. This showed ([id number]) a 1.2 cm area of residual invasive ductal carcinoma, grade 2. Both sentinel lymph nodes were negative. Margins were negative as well.  Finally, since her last visit here, she had a CT-guided biopsy of her left distal femoral diaphysis showing a benign enchondroma.  REVIEW OF SYSTEMS: Jadin has some numbness in her fingertips as the only residual peripheral neuropathy. It does not involve the feet at all. She also has stiffness in both thumbs, and some pain at the base of the left thumb. She has a trigger finger on the left. She has some low back pain which is chronic. She is sleeping poorly. She is using the gabapentin at night. She is having more hot flashes than before. Recall she is status post hysterectomy but not salpingo-oophorectomy. She is not exercising regularly. Unfortunately she is still smoking. A detailed review of systems today was otherwise noncontributory.  PAST MEDICAL HISTORY: Past Medical History  Diagnosis Date  . Anxiety   . COPD (chronic obstructive pulmonary disease) 2011    does not use inhaler ofter  . Depression   . Hot flashes   .  Family history of anesthesia complication     Had a hard time waking father up only once after several procedures  . Shortness of breath     with exertion  . Chronic heartburn   . PVC (premature ventricular contraction)     PMH;at 49 y.o.    . Asthma     denies history of asthma  . Heart murmur     PMH: at age 49; no longer have murmur  . 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 Cancer     DCIS    PAST SURGICAL HISTORY: Past Surgical History  Procedure Laterality Date  . Polyp removal     . Tonsillectomy    . Wisdom tooth extraction    . Robotic assisted total hysterectomy with bilateral salpingo oopherectomy Bilateral 05/24/2013    Procedure: ROBOTIC ASSISTED TOTAL HYSTERECTOMY WITH BILATERAL SALPINGECTOMY;  Surgeon: Lavonia Drafts, MD;  Location: [facility];  Service: Gynecology;  Laterality: Bilateral;  . Cystoscopy N/A 05/24/2013    Procedure: CYSTOSCOPY;  Surgeon: Lavonia Drafts, MD;  Location: [facility];  Service: Gynecology;  Laterality: N/A;  . Abdominal hysterectomy    . Breast surgery      left breast biopsy x 2  . Portacath placement Right 01/02/2014    Procedure: INSERTION PORT-A-CATH;  Surgeon: [name], MD;  Location: [facility];  Service: General;  Laterality: Right;  . Radioactive seed guided mastectomy with axillary sentinel lymph node biopsy Left 05/29/2014    Procedure: RADIOACTIVE SEED GUIDED LEFT PARTIAL MASTECTOMY WITH AXILLARY SENTINEL LYMPH NODE BIOPSY;  Surgeon: [name], MD;  Location: [facility];  Service: General;  Laterality: Left;    FAMILY HISTORY Family History  Problem Relation Age of Onset  . Heart disease Mother   . Hypercholesterolemia Mother   . Heart disease Father   . COPD Father   . Hypercholesterolemia Father   . Cancer Neg Hx   [name] parents are still living, her father is 86 years old and her mother 72 years old as of August 2015. The patient had no brothers, one sister. There is no history of breast or ovarian cancer in the family.  GYNECOLOGIC HISTORY:  Patient's last menstrual period was 03/18/2013. Menarche age 49. She is GX P0. She took oral contraceptives for many years as well as Depo  Provera shots. She is status post hysterectomy with bilateral salpingectomy. 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 She scans at work, and she also does all the shelving and stocking. She is single but for the last 11 years as lives with her significant other [name]. He is disabled secondary to multiple orthopedic problems including bilateral avascular necrosis of the hips. He also had a R-body CVA January 2016. Both of them do smoke.    ADVANCED DIRECTIVES: Not in place    HEALTH MAINTENANCE: History  Substance Use Topics  . Smoking status: Current Every Day Smoker -- 1.00 packs/day for 16 years    Types: Cigarettes  . Smokeless tobacco: Never Used     Comment: 1/2ppd decreased  . Alcohol Use: 0.0 oz/week    0 Standard drinks or equivalent per week     Comment: occasional      Colonoscopy:  PAP: January 2015   Bone density:  Lipid panel:  No Known Allergies  Current Outpatient Prescriptions  Medication Sig Dispense Refill  . budesonide-formoterol (SYMBICORT)  160-4.5 MCG/ACT inhaler Inhale 1 puff into the lungs 2 (two) times daily as needed (for shortness of breath).     . calcium carbonate (TUMS - DOSED IN MG ELEMENTAL CALCIUM) 500 MG chewable tablet Chew 1 tablet by mouth daily.    . cholestyramine (QUESTRAN) 4 G packet Take 1 packet (4 g total) by mouth 3 (three) times daily. (Patient not taking: Reported on 03/25/2014) 60 each 3  . cyclobenzaprine (FLEXERIL) 5 MG tablet Take 1 tablet (5 mg total) by mouth 2 (two) times daily as needed for muscle spasms. 60 tablet 1  . fluticasone (FLONASE) 50 MCG/ACT nasal spray Place 2 sprays into both nostrils daily. (Patient not taking: Reported on 06/30/2014) 16 g 2  . gabapentin (NEURONTIN) 300 MG capsule Take 1 capsule (300 mg total) by mouth 3 (three) times daily. (Patient taking differently: Take 300 mg by mouth daily as needed (for legs). ) 90 capsule 3  . HYDROcodone-acetaminophen  (NORCO/VICODIN) 5-325 MG per tablet Take 1 tablet by mouth every 6 (six) hours as needed for moderate pain. 60 tablet 0  . HYDROcodone-acetaminophen (NORCO/VICODIN) 5-325 MG per tablet Take 1-2 tablets by mouth every 4 (four) hours as needed for moderate pain or severe pain. 40 tablet 0  . hydrocortisone (ANUSOL-HC) 25 MG suppository Place 1 suppository (25 mg total) rectally 2 (two) times daily. 24 suppository 1  . hydrocortisone (PROCTOZONE-HC) 2.5 % rectal cream Place 1 application rectally 2 (two) times daily. (Patient not taking: Reported on 06/20/2014) 30 g 0  . ipratropium (ATROVENT HFA) 17 MCG/ACT inhaler Inhale 1 puff into the lungs every 6 (six) hours as needed for wheezing (SOB).     Marland Kitchen loperamide (IMODIUM) 2 MG capsule TAKE ONE TABLET AFTER EACH LOOSE STOOL. MAXIMUM OF SIX TABLETS A DAY. CHEMOTHERAPY INDUCED DIARRHEA. (Patient not taking: Reported on 06/20/2014) 60 capsule 3  . loratadine (CLARITIN) 10 MG tablet Take 1 tablet (10 mg total) by mouth as needed. (Patient not taking: Reported on 06/30/2014) 90 tablet 1  . LORazepam (ATIVAN) 0.5 MG tablet Take 1 tablet (0.5 mg total) by mouth at bedtime as needed (Nausea or vomiting). (Patient taking differently: Take 0.5 mg by mouth at bedtime as needed for sleep (Nausea or vomiting). ) 30 tablet 0  . omeprazole (PRILOSEC) 40 MG capsule Take 1 capsule (40 mg total) by mouth daily. 30 capsule 2  . PRESCRIPTION MEDICATION Chemo - [facility]    . traZODone (DESYREL) 50 MG tablet Take 1 tablet (50 mg total) by mouth at bedtime as needed for sleep. (Patient not taking: Reported on 06/30/2014) 30 tablet 3  . zolpidem (AMBIEN) 5 MG tablet Take 1 tablet (5 mg total) by mouth at bedtime as needed for sleep. 90 tablet 3   No current facility-administered medications for this visit.    OBJECTIVE: middle-aged white woman in no acute distress  Filed Vitals:   07/02/14 1254  BP: 137/76  Pulse: 70  Temp: 97.9 F (36.6 C)  Resp: 20     Body mass index is 32.95  kg/(m^2).    ECOG FS:1 - Symptomatic but completely ambulatory  Sclerae unicteric, pupils equal and reactive Oropharynx clear and moist-- no thrush or other lesions No cervical or supraclavicular adenopathy Lungs no rales or rhonchi Heart regular rate and rhythm Abd soft, obese, nontender, positive bowel sounds MSK no focal spinal tenderness, no upper extremity lymphedema Neuro: nonfocal, well oriented, appropriate affect Breasts: The right breast is unremarkable. The left breast is status post lumpectomy. There is  no evidence of local recurrence. The cosmetic result is excellent. The left axilla is benign.   LAB RESULTS:  CMP     Component Value Date/Time   NA 141 06/10/2014 0850   NA 139 12/31/2013 1448   K 3.7 06/10/2014 0850   K 4.6 12/31/2013 1448   CL 103 12/31/2013 1448   CO2 25 06/10/2014 0850   CO2 24 12/31/2013 1448   GLUCOSE 107 06/10/2014 0850   GLUCOSE 93 12/31/2013 1448   BUN 11.3 06/10/2014 0850   BUN 9 12/31/2013 1448   CREATININE 0.8 06/10/2014 0850   CREATININE 0.83 12/31/2013 1448   CALCIUM 9.8 06/10/2014 0850   CALCIUM 8.8 12/31/2013 1448   PROT 6.5 06/10/2014 0850   PROT 7.1 06/04/2013 1511   ALBUMIN 4.0 06/10/2014 0850   ALBUMIN 3.4* 06/04/2013 1511   AST 20 06/10/2014 0850   AST 12 06/04/2013 1511   ALT 22 06/10/2014 0850   ALT 14 06/04/2013 1511   ALKPHOS 66 06/10/2014 0850   ALKPHOS 70 06/04/2013 1511   BILITOT 0.49 06/10/2014 0850   BILITOT 0.5 06/04/2013 1511   GFRNONAA 83* 12/31/2013 1448   GFRAA >90 12/31/2013 1448    I No results found for: SPEP  Lab Results  Component Value Date   WBC 6.4 07/02/2014   NEUTROABS 4.3 07/02/2014   HGB 13.3 07/02/2014   HCT 39.9 07/02/2014   MCV 99.0 07/02/2014   PLT 160 07/02/2014      Chemistry      Component Value Date/Time   NA 141 06/10/2014 0850   NA 139 12/31/2013 1448   K 3.7 06/10/2014 0850   K 4.6 12/31/2013 1448   CL 103 12/31/2013 1448   CO2 25 06/10/2014 0850   CO2 24  12/31/2013 1448   BUN 11.3 06/10/2014 0850   BUN 9 12/31/2013 1448   CREATININE 0.8 06/10/2014 0850   CREATININE 0.83 12/31/2013 1448      Component Value Date/Time   CALCIUM 9.8 06/10/2014 0850   CALCIUM 8.8 12/31/2013 1448   ALKPHOS 66 06/10/2014 0850   ALKPHOS 70 06/04/2013 1511   AST 20 06/10/2014 0850   AST 12 06/04/2013 1511   ALT 22 06/10/2014 0850   ALT 14 06/04/2013 1511   BILITOT 0.49 06/10/2014 0850   BILITOT 0.5 06/04/2013 1511       No results found for: LABCA2  No components found for: [id number]  No results for input(s): INR in the last 168 hours.  Urinalysis    Component Value Date/Time   COLORURINE YELLOW 06/04/2013 1420   APPEARANCEUR HAZY* 06/04/2013 1420   LABSPEC 1.030 01/27/2014 1350   LABSPEC >1.030* 06/04/2013 1420   PHURINE 6.0 06/04/2013 1420   GLUCOSEU Negative 01/27/2014 1350   GLUCOSEU NEGATIVE 06/04/2013 1420   HGBUR MODERATE* 06/04/2013 1420   HGBUR negative 10/07/2009 1044   BILIRUBINUR NEGATIVE 06/04/2013 1420   KETONESUR NEGATIVE 06/04/2013 1420   PROTEINUR NEGATIVE 06/04/2013 1420   UROBILINOGEN Color Interference 01/27/2014 1350   UROBILINOGEN 0.2 06/04/2013 1420   NITRITE Color Interference 01/27/2014 1350   NITRITE NEGATIVE 06/04/2013 1420   LEUKOCYTESUR NEGATIVE 06/04/2013 1420    STUDIES: Most recent echocardiogram on 05/05/14 shows an EF 55-60%  Ct Biopsy  06/20/2014   CLINICAL DATA:  49 year old female with a history of breast cancer and mixed lucent and intra medullary lesion with activity on nuclear medicine bone scan in the distal left femoral diaphysis. Findings may represent benign or malignant chondroid lesion, or less likely breast  cancer metastasis.  EXAM: CT BIOPSY  Date: 06/20/2014  PROCEDURE: 1. CT-guided biopsy of left distal femoral lesion Interventional Radiologist:  Criselda Peaches, MD  ANESTHESIA/SEDATION: Moderate (conscious) sedation was used. 8 mg Versed, 200 mcg Fentanyl were administered intravenously.  [name] vital signs were monitored continuously by radiology nursing throughout the procedure.  Sedation Time: 23 minutes  MEDICATIONS: None additional  TECHNIQUE: Informed consent was obtained from the patient following explanation of the procedure, risks, benefits and alternatives. The patient understands, agrees and consents for the procedure. All questions were addressed. A time out was performed.  Maximal barrier sterile technique utilized including caps, mask, sterile gowns, sterile gloves, large sterile drape, hand hygiene, and Betadine skin prep.  A planning axial CT scan was performed. There is a lesion in the medullary space of the distal femur with slight erosion of the inner table superiorly. There is evidence of a chondroid matrix within the structure. A suitable skin entry site was selected and marked. The region was then sterilely prepped and draped in standard fashion using Betadine skin prep. Local anesthesia was attained by infiltration with 1% lidocaine. Copious anesthesia was obtained of the periosteum.  A small dermatotomy was made. An 11 gauge bone needle was then advanced and docked on the femoral surface. Positioning was confirmed by CT fluoroscopic imaging. Using the on Control drill, the drill was advanced just through the cortex. The inter cannula was then used to take multiple samples of the medullary space. Samples were placed in formalin and delivered to pathology for further analysis.  The needles were removed. Hemostasis was attained by gentle manual pressure. The patient tolerated the procedure well.  COMPLICATIONS: None  IMPRESSION: 1. CT imaging and demonstrates chondroid matrix within the distal left femoral lesion most suggestive of either a benign, or malignant chondroid lesion. No overtly aggressive or malignant imaging features are noted. There is very mild endosteal erosion in 1 location. This was targeted for biopsy. 2. Successful CT-guided core biopsy of intra  medullary distal left femoral diaphyseal lesion. Signed,  Criselda Peaches, MD  [facility]  [facility]   Electronically Signed   By: Jacqulynn Cadet M.D.   On: 06/20/2014 17:09    ASSESSMENT: 49 y.o. BRCA negative [place] woman status post left breast upper outer quadrant biopsy 11/29/2013 for a clinical T2 N0, stage IIA invasive ductal carcinoma, grade 1, triple positive, with an MIB-1 of 46%.  (1) MRI-guided biopsy of two additional areas in the left breast showed only tubular adenomas, no malignancy.   (2) neoadjuvant chemotherapy started 01/14/2014, consisting of carboplatin, docetaxel, trastuzumab and pertuzumab given every 21 days for 6 cycles, with Neulasta on day 2. Completed 04/30/14  (3) trastuzumab to be continued to the total one year, through September of 2016  (a) most recent echo 05/05/2014 shows a well-preserved ejection fraction  (4) status post left lumpectomy with axillary sentinel lymph node biopsy on 05/29/2014 showing a residual ypT1c ypN0 invasive ductal carcinoma, grade 2, with negative margins   (5) radiation to follow surgery  (6) antiestrogen therapy to follow radiation  (7) abnormal radiotracer uptake in distal left femur evaluated with CT-guided biopsy 06/20/2014, showing enchondroma, with low cellularity, no mitotic figures, and no atypia.  PLAN: Lauralee is recovering well from her chemotherapy, and is tolerating the trastuzumab well. She needs an echocardiogram before the end of this month and that has been requested. She will be continuing the trastuzumab through September of this year area.  We discussed the fact that she does have residual disease. It is not yet standard of care to give further chemotherapy to patients with residual disease, and probably it never will be in a year or positive patients, the majority of which do not achieve a complete pathologic response. Accordingly she is done with  chemotherapy and the plan is to add anti-estrogens once she completes radiation.   She is also delighted that the lesion on her leg proved to be benign. She is being followed for this by Dr. Redmond Pulling at [facility]. He is planning evaluation every 4 months at least for the next year just to be sure there is no significant change.  Truman Hayward will see me again at the completion of radiation. At that time we will consider going on anastrozole. I will obtain an [place] and estradiol level prior to that visit.  I have encouraged her to exercise regularly, chiefly by walking. She is having some arthritis pain involving her hands (quite separate from peripheral neuropathy issues) and she may benefit from referral to a hand specialist. Otherwise she knows to call for any problems that may develop before her next visit here.  [name], MD   07/02/2014 1:06 PM

## 2014-07-02 NOTE — Telephone Encounter (Signed)
appts made and avs printed for pt  Jessica [name] °

## 2014-07-02 NOTE — Patient Instructions (Signed)
[facility] Discharge Instructions for Patients Receiving Chemotherapy  Today you received the following chemotherapy agents Herceptin  To help prevent nausea and vomiting after your treatment, we encourage you to take your nausea medication as prescribed.   If you develop nausea and vomiting that is not controlled by your nausea medication, call the clinic.   BELOW ARE SYMPTOMS THAT SHOULD BE REPORTED IMMEDIATELY:  *FEVER GREATER THAN 100.5 F  *CHILLS WITH OR WITHOUT FEVER  NAUSEA AND VOMITING THAT IS NOT CONTROLLED WITH YOUR NAUSEA MEDICATION  *UNUSUAL SHORTNESS OF BREATH  *UNUSUAL BRUISING OR BLEEDING  TENDERNESS IN MOUTH AND THROAT WITH OR WITHOUT PRESENCE OF ULCERS  *URINARY PROBLEMS  *BOWEL PROBLEMS  UNUSUAL RASH Items with * indicate a potential emergency and should be followed up as soon as possible.  Feel free to call the clinic you have any questions or concerns. The clinic phone number is [phone].  Please show the [facility] at check to the Emergency Department and triage nurse.

## 2014-07-02 NOTE — Patient Instructions (Signed)

## 2014-07-02 NOTE — Addendum Note (Signed)
Addended by: Laureen Abrahams on: 07/02/2014 04:38 PM   Modules accepted: Orders, Medications

## 2014-07-04 ENCOUNTER — Ambulatory Visit
Admission: RE | Admit: 2014-07-04 | Discharge: 2014-07-04 | Disposition: A | Discharge status: Home / Self Care | Payer: Medicaid Other | Source: Ambulatory Visit | Attending: Radiation Oncology | Admitting: Radiation Oncology

## 2014-07-04 ENCOUNTER — Telehealth: Payer: Self-pay | Admitting: Nurse Practitioner

## 2014-07-04 DIAGNOSIS — Z510 Encounter for antineoplastic radiation therapy: Secondary | ICD-10-CM | POA: Diagnosis not present

## 2014-07-04 DIAGNOSIS — C50412 Malignant neoplasm of upper-outer quadrant of left female breast: Secondary | ICD-10-CM

## 2014-07-04 NOTE — Telephone Encounter (Signed)
Called and left message with echo appointment

## 2014-07-07 ENCOUNTER — Other Ambulatory Visit: Payer: Self-pay | Admitting: Nurse Practitioner

## 2014-07-07 DIAGNOSIS — C50412 Malignant neoplasm of upper-outer quadrant of left female breast: Secondary | ICD-10-CM

## 2014-07-08 NOTE — Progress Notes (Signed)
  Radiation Oncology         [phone] ________________________________  Name: Jessica [name] MRN: [id number]  Date: 07/04/2014  DOB: 12/28/1965  SIMULATION AND TREATMENT PLANNING NOTE  The patient presented for simulation prior to beginning her course of radiation treatment for her diagnosis of left-sided breast cancer. The patient was placed in a supine position on a breast board. A customized vac-lock bag was constructed and this complex treatment device will be used on a daily basis during her treatment. In this fashion, a CT scan was obtained through the chest area and an isocenter was placed near the chest wall within the breast.  The patient will be planned to receive a course of radiation initially to a dose of 50.4 Gy. This will consist of a whole breast radiotherapy technique. To accomplish this, 2 customized blocks have been designed which will correspond to medial and lateral whole breast tangent fields. This treatment will be accomplished at 1.8 Gy per fraction. A forward planning technique will also be evaluated to determine if this approach improves the plan. It is anticipated that the patient will then receive a 10 Gy boost to the seroma cavity which has been contoured. This will be accomplished at 2 Gy per fraction.   This initial treatment will consist of a 3-D conformal technique. The seroma has been contoured as the primary target structure. Additionally, dose volume histograms of both this target as well as the lungs and heart will also be evaluated. Such an approach is necessary to ensure that the target area is adequately covered while the nearby critical  normal structures are adequately spared.  Plan:  The final anticipated total dose therefore will correspond to 60.4 Gy.    _______________________________   Jodelle Gross, MD, PhD

## 2014-07-09 DIAGNOSIS — Z510 Encounter for antineoplastic radiation therapy: Secondary | ICD-10-CM | POA: Diagnosis not present

## 2014-07-11 ENCOUNTER — Other Ambulatory Visit: Payer: Self-pay | Admitting: Nurse Practitioner

## 2014-07-11 ENCOUNTER — Ambulatory Visit
Admission: RE | Admit: 2014-07-11 | Discharge: 2014-07-11 | Disposition: A | Discharge status: Home / Self Care | Payer: Medicaid Other | Source: Ambulatory Visit | Attending: Radiation Oncology | Admitting: Radiation Oncology

## 2014-07-11 DIAGNOSIS — Z510 Encounter for antineoplastic radiation therapy: Secondary | ICD-10-CM | POA: Diagnosis not present

## 2014-07-11 DIAGNOSIS — C50412 Malignant neoplasm of upper-outer quadrant of left female breast: Secondary | ICD-10-CM

## 2014-07-11 MED ORDER — RADIAPLEXRX EX GEL
Freq: Once | CUTANEOUS | Status: AC
  Administered 2014-07-11: 15:00:00 via TOPICAL

## 2014-07-11 MED ORDER — ALRA NON-METALLIC DEODORANT (RAD-ONC)
1.0000 "application " | Freq: Once | TOPICAL | Status: AC
  Administered 2014-07-11: 1 via TOPICAL

## 2014-07-11 NOTE — Progress Notes (Addendum)
Pt here for patient teaching.  Pt given Radiation and You booklet, skin care instructions, Alra deodorant and Radiaplex gel. Reviewed areas of pertinence such as fatigue, skin changes, breast tenderness, breast swelling, shortness of breath, earaches and taste changes . Pt able to give teach back of to pat skin, use unscented/gentle soap, use baby wipes and drink plenty of water,apply Radiaplex bid, avoid applying anything to skin within 4 hours of treatment, avoid wearing an under wire bra and to use an electric razor if they must shave. Pt Stated understanding of information given and will contact nursing with any questions or concerns.  Given this RN's card.

## 2014-07-14 ENCOUNTER — Ambulatory Visit
Admission: RE | Admit: 2014-07-14 | Discharge: 2014-07-14 | Disposition: A | Discharge status: Home / Self Care | Payer: Medicaid Other | Source: Ambulatory Visit | Attending: Radiation Oncology | Admitting: Radiation Oncology

## 2014-07-14 DIAGNOSIS — Z510 Encounter for antineoplastic radiation therapy: Secondary | ICD-10-CM | POA: Diagnosis not present

## 2014-07-15 ENCOUNTER — Ambulatory Visit (HOSPITAL_COMMUNITY)
Admission: RE | Admit: 2014-07-15 | Discharge: 2014-07-15 | Disposition: A | Discharge status: Home / Self Care | Payer: Medicaid Other | Source: Ambulatory Visit | Attending: Oncology | Admitting: Oncology

## 2014-07-15 ENCOUNTER — Ambulatory Visit
Admission: RE | Admit: 2014-07-15 | Discharge: 2014-07-15 | Disposition: A | Discharge status: Home / Self Care | Payer: Medicaid Other | Source: Ambulatory Visit | Attending: Radiation Oncology | Admitting: Radiation Oncology

## 2014-07-15 DIAGNOSIS — Z5111 Encounter for antineoplastic chemotherapy: Secondary | ICD-10-CM | POA: Diagnosis not present

## 2014-07-15 DIAGNOSIS — C50412 Malignant neoplasm of upper-outer quadrant of left female breast: Secondary | ICD-10-CM

## 2014-07-15 DIAGNOSIS — D169 Benign neoplasm of bone and articular cartilage, unspecified: Secondary | ICD-10-CM

## 2014-07-15 DIAGNOSIS — Z720 Tobacco use: Secondary | ICD-10-CM | POA: Insufficient documentation

## 2014-07-15 DIAGNOSIS — F17200 Nicotine dependence, unspecified, uncomplicated: Secondary | ICD-10-CM

## 2014-07-15 DIAGNOSIS — C50919 Malignant neoplasm of unspecified site of unspecified female breast: Secondary | ICD-10-CM | POA: Diagnosis present

## 2014-07-15 DIAGNOSIS — Z510 Encounter for antineoplastic radiation therapy: Secondary | ICD-10-CM | POA: Diagnosis not present

## 2014-07-15 NOTE — Progress Notes (Signed)
  Echocardiogram 2D Echocardiogram has been performed.  Jessica [name] 07/15/2014, 9:03 AM

## 2014-07-16 ENCOUNTER — Encounter: Payer: Self-pay | Admitting: Radiation Oncology

## 2014-07-16 ENCOUNTER — Ambulatory Visit
Admission: RE | Admit: 2014-07-16 | Discharge: 2014-07-16 | Disposition: A | Discharge status: Home / Self Care | Payer: Medicaid Other | Source: Ambulatory Visit | Attending: Radiation Oncology | Admitting: Radiation Oncology

## 2014-07-16 VITALS — BP 158/80 | HR 77 | Temp 97.8°F | Resp 20 | Wt 202.2 lb

## 2014-07-16 DIAGNOSIS — Z510 Encounter for antineoplastic radiation therapy: Secondary | ICD-10-CM | POA: Diagnosis not present

## 2014-07-16 DIAGNOSIS — C50412 Malignant neoplasm of upper-outer quadrant of left female breast: Secondary | ICD-10-CM

## 2014-07-16 NOTE — Progress Notes (Signed)
   Weekly Management Note:  Outpatient    ICD-9-CM ICD-10-CM   1. Breast cancer of upper-outer quadrant of left female breast 174.4 C50.412     Current Dose:  5.4 Gy  Projected Dose: 60.4 Gy   Narrative:  The patient presents for routine under treatment assessment.  CBCT/MVCT images/Port film x-rays were reviewed.  The chart was checked. No complaints  Physical Findings:  weight is 202 lb 3.2 oz (91.717 kg). Her oral temperature is 97.8 F (36.6 C). Her blood pressure is 158/80 and her pulse is 77. Her respiration is 20.   Wt Readings from Last 3 Encounters:  07/02/14 198 lb (89.812 kg)  06/10/14 199 lb 9.6 oz (90.538 kg)  05/29/14 199 lb (90.266 kg)   Slightly erythematous left breast  Impression:  The patient is tolerating radiotherapy.  Plan:  Continue radiotherapy as planned.    ________________________________   Eppie Gibson, M.D.

## 2014-07-16 NOTE — Progress Notes (Signed)
Weekly rad txs 3/33 completed,  Left breast, mild erythema using radiaplex gel bid, skin intact, appetite good, nopain in breast 11:37 AM

## 2014-07-17 ENCOUNTER — Ambulatory Visit
Admission: RE | Admit: 2014-07-17 | Discharge: 2014-07-17 | Disposition: A | Discharge status: Home / Self Care | Payer: Medicaid Other | Source: Ambulatory Visit | Attending: Radiation Oncology | Admitting: Radiation Oncology

## 2014-07-17 DIAGNOSIS — Z510 Encounter for antineoplastic radiation therapy: Secondary | ICD-10-CM | POA: Diagnosis not present

## 2014-07-18 ENCOUNTER — Ambulatory Visit
Admission: RE | Admit: 2014-07-18 | Discharge: 2014-07-18 | Disposition: A | Discharge status: Home / Self Care | Payer: Medicaid Other | Source: Ambulatory Visit | Attending: Radiation Oncology | Admitting: Radiation Oncology

## 2014-07-18 DIAGNOSIS — Z510 Encounter for antineoplastic radiation therapy: Secondary | ICD-10-CM | POA: Diagnosis not present

## 2014-07-21 ENCOUNTER — Ambulatory Visit: Payer: Medicaid Other

## 2014-07-21 ENCOUNTER — Ambulatory Visit
Admission: RE | Admit: 2014-07-21 | Discharge: 2014-07-21 | Disposition: A | Discharge status: Home / Self Care | Payer: Medicaid Other | Source: Ambulatory Visit | Attending: Radiation Oncology | Admitting: Radiation Oncology

## 2014-07-21 DIAGNOSIS — Z510 Encounter for antineoplastic radiation therapy: Secondary | ICD-10-CM | POA: Diagnosis not present

## 2014-07-22 ENCOUNTER — Ambulatory Visit
Admission: RE | Admit: 2014-07-22 | Discharge: 2014-07-22 | Disposition: A | Discharge status: Home / Self Care | Payer: Medicaid Other | Source: Ambulatory Visit | Attending: Radiation Oncology | Admitting: Radiation Oncology

## 2014-07-22 ENCOUNTER — Ambulatory Visit: Payer: Medicaid Other

## 2014-07-22 ENCOUNTER — Ambulatory Visit (HOSPITAL_BASED_OUTPATIENT_CLINIC_OR_DEPARTMENT_OTHER): Payer: Medicaid Other

## 2014-07-22 DIAGNOSIS — Z510 Encounter for antineoplastic radiation therapy: Secondary | ICD-10-CM | POA: Diagnosis not present

## 2014-07-22 DIAGNOSIS — Z5112 Encounter for antineoplastic immunotherapy: Secondary | ICD-10-CM

## 2014-07-22 DIAGNOSIS — C50412 Malignant neoplasm of upper-outer quadrant of left female breast: Secondary | ICD-10-CM | POA: Diagnosis not present

## 2014-07-22 MED ORDER — SODIUM CHLORIDE 0.9 % IV SOLN
Freq: Once | INTRAVENOUS | Status: AC
  Administered 2014-07-22: 13:00:00 via INTRAVENOUS

## 2014-07-22 MED ORDER — TRASTUZUMAB CHEMO INJECTION 440 MG
6.0000 mg/kg | Freq: Once | INTRAVENOUS | Status: AC
  Administered 2014-07-22: 546 mg via INTRAVENOUS
  Filled 2014-07-22: qty 26

## 2014-07-22 MED ORDER — ACETAMINOPHEN 325 MG PO TABS
ORAL_TABLET | ORAL | Status: AC
  Filled 2014-07-22: qty 2

## 2014-07-22 MED ORDER — ACETAMINOPHEN 325 MG PO TABS
650.0000 mg | ORAL_TABLET | Freq: Once | ORAL | Status: AC
  Administered 2014-07-22: 650 mg via ORAL

## 2014-07-22 MED ORDER — SODIUM CHLORIDE 0.9 % IJ SOLN
10.0000 mL | INTRAMUSCULAR | Status: DC | PRN
  Administered 2014-07-22: 10 mL
  Filled 2014-07-22: qty 10

## 2014-07-22 MED ORDER — HEPARIN SOD (PORK) LOCK FLUSH 100 UNIT/ML IV SOLN
500.0000 [IU] | Freq: Once | INTRAVENOUS | Status: AC | PRN
  Administered 2014-07-22: 500 [IU]
  Filled 2014-07-22: qty 5

## 2014-07-22 MED ORDER — DIPHENHYDRAMINE HCL 25 MG PO CAPS
25.0000 mg | ORAL_CAPSULE | Freq: Once | ORAL | Status: AC
  Administered 2014-07-22: 25 mg via ORAL

## 2014-07-22 MED ORDER — DIPHENHYDRAMINE HCL 25 MG PO CAPS
ORAL_CAPSULE | ORAL | Status: AC
  Filled 2014-07-22: qty 1

## 2014-07-22 NOTE — Patient Instructions (Signed)
[facility] Discharge Instructions for Patients Receiving Chemotherapy  Today you received the following chemotherapy agents herceptin   To help prevent nausea and vomiting after your treatment, we encourage you to take your nausea medication as directed   If you develop nausea and vomiting that is not controlled by your nausea medication, call the clinic.   BELOW ARE SYMPTOMS THAT SHOULD BE REPORTED IMMEDIATELY:  *FEVER GREATER THAN 100.5 F  *CHILLS WITH OR WITHOUT FEVER  NAUSEA AND VOMITING THAT IS NOT CONTROLLED WITH YOUR NAUSEA MEDICATION  *UNUSUAL SHORTNESS OF BREATH  *UNUSUAL BRUISING OR BLEEDING  TENDERNESS IN MOUTH AND THROAT WITH OR WITHOUT PRESENCE OF ULCERS  *URINARY PROBLEMS  *BOWEL PROBLEMS  UNUSUAL RASH Items with * indicate a potential emergency and should be followed up as soon as possible.  Feel free to call the clinic you have any questions or concerns. The clinic phone number is [phone].  

## 2014-07-23 ENCOUNTER — Encounter: Payer: Self-pay | Admitting: Radiation Oncology

## 2014-07-23 ENCOUNTER — Ambulatory Visit: Payer: Medicaid Other

## 2014-07-23 ENCOUNTER — Ambulatory Visit
Admission: RE | Admit: 2014-07-23 | Discharge: 2014-07-23 | Disposition: A | Discharge status: Home / Self Care | Payer: Medicaid Other | Source: Ambulatory Visit | Attending: Radiation Oncology | Admitting: Radiation Oncology

## 2014-07-23 VITALS — BP 160/84 | HR 96 | Temp 98.0°F | Resp 20 | Wt 204.4 lb

## 2014-07-23 DIAGNOSIS — Z510 Encounter for antineoplastic radiation therapy: Secondary | ICD-10-CM | POA: Diagnosis not present

## 2014-07-23 DIAGNOSIS — C50412 Malignant neoplasm of upper-outer quadrant of left female breast: Secondary | ICD-10-CM

## 2014-07-23 MED ORDER — ESZOPICLONE 1 MG PO TABS: 1.0000 mg | ORAL_TABLET | Freq: Every evening | ORAL | Status: DC | PRN

## 2014-07-23 NOTE — Progress Notes (Signed)
Weekly rad txs, 7/33 left breast, mild erythema ,skin intact,  Using radiaplex bid,  Not sleeping well, takes, ativan, gabapentin and ambien, can only get 15 tabs ambien per medicaid , wants something else ,appetite good , occasional sharp pain under axilla 11:52 AM

## 2014-07-23 NOTE — Progress Notes (Signed)
Department of Radiation Oncology  Phone:  [phone] Fax:        [phone]  Weekly Treatment Note    Name: Jessica [name] Date: 07/23/2014 MRN: [id number] DOB: 04/05/1966   Current dose: 12.6 Gy  Current fraction: 7   MEDICATIONS: Current Outpatient Prescriptions  Medication Sig Dispense Refill  . budesonide-formoterol (SYMBICORT) 160-4.5 MCG/ACT inhaler Inhale 1 puff into the lungs 2 (two) times daily as needed (for shortness of breath).     . calcium carbonate (TUMS - DOSED IN MG ELEMENTAL CALCIUM) 500 MG chewable tablet Chew 1 tablet by mouth daily.    . cyclobenzaprine (FLEXERIL) 5 MG tablet TAKE 1 TABLET BY MOUTH TWICE A DAY AS NEEDED FOR MUSCLE SPASMS 60 tablet 0  . fluticasone (FLONASE) 50 MCG/ACT nasal spray Place 2 sprays into both nostrils daily. 16 g 2  . gabapentin (NEURONTIN) 300 MG capsule Take 1 capsule (300 mg total) by mouth 3 (three) times daily. (Patient taking differently: Take 300 mg by mouth daily as needed (for legs). ) 90 capsule 3  . hyaluronate sodium (RADIAPLEXRX) GEL Apply 1 application topically 2 (two) times daily.    Marland Kitchen HYDROcodone-acetaminophen (NORCO/VICODIN) 5-325 MG per tablet Take 1 tablet by mouth every 6 (six) hours as needed for moderate pain. 60 tablet 0  . ipratropium (ATROVENT HFA) 17 MCG/ACT inhaler Inhale 1 puff into the lungs every 6 (six) hours as needed for wheezing (SOB).     Marland Kitchen loratadine (CLARITIN) 10 MG tablet Take 1 tablet (10 mg total) by mouth as needed. 90 tablet 1  . LORazepam (ATIVAN) 0.5 MG tablet Take 1 tablet by mouth at bedtime as need to relax. 30 tablet 0  . non-metallic deodorant (ALRA) MISC Apply 1 application topically daily as needed.    Marland Kitchen omeprazole (PRILOSEC) 40 MG capsule Take 1 capsule (40 mg total) by mouth daily. 30 capsule 2  . zolpidem (AMBIEN) 5 MG tablet Take 1 tablet (5 mg total) by mouth at bedtime as needed for sleep. 90 tablet 3  . eszopiclone (LUNESTA) 1 MG TABS tablet Take 1 tablet (1 mg total)  by mouth at bedtime as needed for sleep. Take immediately before bedtime 30 tablet 0   No current facility-administered medications for this encounter.     ALLERGIES: Review of patient's allergies indicates no known allergies.   LABORATORY DATA:  Lab Results  Component Value Date   WBC 6.4 07/02/2014   HGB 13.3 07/02/2014   HCT 39.9 07/02/2014   MCV 99.0 07/02/2014   PLT 160 07/02/2014   Lab Results  Component Value Date   NA 141 07/02/2014   K 4.0 07/02/2014   CL 103 12/31/2013   CO2 25 07/02/2014   Lab Results  Component Value Date   ALT 21 07/02/2014   AST 18 07/02/2014   ALKPHOS 68 07/02/2014   BILITOT 0.44 07/02/2014     NARRATIVE: Jessica [name] was seen today for weekly treatment management. The chart was checked and the patient's films were reviewed.  Weekly rad txs, 7/33 left breast, mild erythema ,skin intact,  Using radiaplex bid,  Not sleeping well, takes, ativan, gabapentin and ambien, can only get 15 tabs ambien per medicaid , wants something else ,appetite good , occasional sharp pain under axilla   PHYSICAL EXAMINATION: weight is 204 lb 6.4 oz (92.715 kg). Her oral temperature is 98 F (36.7 C). Her blood pressure is 160/84 and her pulse is 96. Her respiration is 20.  ASSESSMENT: The patient is doing satisfactorily with treatment.  PLAN: We will continue with the patient's radiation treatment as planned. The patient has been given a prescription for Lunesta to try instead of Ambien.

## 2014-07-24 ENCOUNTER — Ambulatory Visit
Admission: RE | Admit: 2014-07-24 | Discharge: 2014-07-24 | Disposition: A | Discharge status: Home / Self Care | Payer: Medicaid Other | Source: Ambulatory Visit | Attending: Radiation Oncology | Admitting: Radiation Oncology

## 2014-07-24 ENCOUNTER — Ambulatory Visit: Payer: Medicaid Other

## 2014-07-24 DIAGNOSIS — Z510 Encounter for antineoplastic radiation therapy: Secondary | ICD-10-CM | POA: Diagnosis not present

## 2014-07-25 ENCOUNTER — Ambulatory Visit
Admission: RE | Admit: 2014-07-25 | Discharge: 2014-07-25 | Disposition: A | Discharge status: Home / Self Care | Payer: Medicaid Other | Source: Ambulatory Visit | Attending: Radiation Oncology | Admitting: Radiation Oncology

## 2014-07-25 ENCOUNTER — Ambulatory Visit: Payer: Medicaid Other

## 2014-07-25 DIAGNOSIS — Z510 Encounter for antineoplastic radiation therapy: Secondary | ICD-10-CM | POA: Diagnosis not present

## 2014-07-28 ENCOUNTER — Ambulatory Visit
Admission: RE | Admit: 2014-07-28 | Discharge: 2014-07-28 | Disposition: A | Discharge status: Home / Self Care | Payer: Medicaid Other | Source: Ambulatory Visit | Attending: Radiation Oncology | Admitting: Radiation Oncology

## 2014-07-28 ENCOUNTER — Ambulatory Visit: Payer: Medicaid Other

## 2014-07-28 DIAGNOSIS — Z510 Encounter for antineoplastic radiation therapy: Secondary | ICD-10-CM | POA: Diagnosis not present

## 2014-07-29 ENCOUNTER — Ambulatory Visit: Payer: Medicaid Other

## 2014-07-29 ENCOUNTER — Ambulatory Visit
Admission: RE | Admit: 2014-07-29 | Discharge: 2014-07-29 | Disposition: A | Discharge status: Home / Self Care | Payer: Medicaid Other | Source: Ambulatory Visit | Attending: Radiation Oncology | Admitting: Radiation Oncology

## 2014-07-29 ENCOUNTER — Telehealth: Payer: Self-pay | Admitting: *Deleted

## 2014-07-29 NOTE — Telephone Encounter (Signed)
Patient requested call back, has a head cold, ears anad nose stopped up has tried, allergy pill OTC alka seltzer plus tablet, hasn't helped as yet stated, we are treating her breast, asked if sh had taken any mucinex,  Also, no, she doesn't have a primary MD, no fever,  MD is off today, suggested she try her nedi pot and mucinex, and we would see her tomorrow ,if gets worseg ,go to an urgent care facility  Patient  Thanked this RN for retuerning call, and I let her know the machine is still down and so her treatment today is cancelled  11:40 AM

## 2014-07-30 ENCOUNTER — Ambulatory Visit
Admission: RE | Admit: 2014-07-30 | Discharge: 2014-07-30 | Disposition: A | Discharge status: Home / Self Care | Payer: Medicaid Other | Source: Ambulatory Visit | Attending: Radiation Oncology | Admitting: Radiation Oncology

## 2014-07-30 ENCOUNTER — Ambulatory Visit: Payer: Medicaid Other

## 2014-07-30 DIAGNOSIS — Z510 Encounter for antineoplastic radiation therapy: Secondary | ICD-10-CM | POA: Diagnosis not present

## 2014-07-31 ENCOUNTER — Ambulatory Visit: Payer: Medicaid Other

## 2014-07-31 ENCOUNTER — Ambulatory Visit
Admission: RE | Admit: 2014-07-31 | Discharge: 2014-07-31 | Disposition: A | Discharge status: Home / Self Care | Payer: Medicaid Other | Source: Ambulatory Visit | Attending: Radiation Oncology | Admitting: Radiation Oncology

## 2014-07-31 DIAGNOSIS — Z510 Encounter for antineoplastic radiation therapy: Secondary | ICD-10-CM | POA: Diagnosis not present

## 2014-08-01 ENCOUNTER — Ambulatory Visit: Payer: Medicaid Other

## 2014-08-01 ENCOUNTER — Ambulatory Visit
Admission: RE | Admit: 2014-08-01 | Discharge: 2014-08-01 | Disposition: A | Discharge status: Home / Self Care | Payer: Medicaid Other | Source: Ambulatory Visit | Attending: Radiation Oncology | Admitting: Radiation Oncology

## 2014-08-01 ENCOUNTER — Encounter: Payer: Self-pay | Admitting: Radiation Oncology

## 2014-08-01 VITALS — BP 133/83 | HR 82 | Temp 98.6°F | Resp 16 | Wt 196.3 lb

## 2014-08-01 DIAGNOSIS — Z510 Encounter for antineoplastic radiation therapy: Secondary | ICD-10-CM | POA: Diagnosis not present

## 2014-08-01 DIAGNOSIS — C50412 Malignant neoplasm of upper-outer quadrant of left female breast: Secondary | ICD-10-CM

## 2014-08-01 NOTE — Progress Notes (Signed)
Department of Radiation Oncology  Phone:  [phone] Fax:        [phone]  Weekly Treatment Note    Name: Jessica [name] Date: 08/01/2014 MRN: [id number] DOB: Jul 16, 1965   Current dose: 25.2 Gy  Current fraction: 14   MEDICATIONS: Current Outpatient Prescriptions  Medication Sig Dispense Refill  . budesonide-formoterol (SYMBICORT) 160-4.5 MCG/ACT inhaler Inhale 1 puff into the lungs 2 (two) times daily as needed (for shortness of breath).     . calcium carbonate (TUMS - DOSED IN MG ELEMENTAL CALCIUM) 500 MG chewable tablet Chew 1 tablet by mouth daily.    . cyclobenzaprine (FLEXERIL) 5 MG tablet TAKE 1 TABLET BY MOUTH TWICE A DAY AS NEEDED FOR MUSCLE SPASMS 60 tablet 0  . eszopiclone (LUNESTA) 1 MG TABS tablet Take 1 tablet (1 mg total) by mouth at bedtime as needed for sleep. Take immediately before bedtime 30 tablet 0  . fluticasone (FLONASE) 50 MCG/ACT nasal spray Place 2 sprays into both nostrils daily. 16 g 2  . gabapentin (NEURONTIN) 300 MG capsule Take 1 capsule (300 mg total) by mouth 3 (three) times daily. (Patient taking differently: Take 300 mg by mouth daily as needed (for legs). ) 90 capsule 3  . hyaluronate sodium (RADIAPLEXRX) GEL Apply 1 application topically 2 (two) times daily.    Marland Kitchen HYDROcodone-acetaminophen (NORCO/VICODIN) 5-325 MG per tablet Take 1 tablet by mouth every 6 (six) hours as needed for moderate pain. 60 tablet 0  . ipratropium (ATROVENT HFA) 17 MCG/ACT inhaler Inhale 1 puff into the lungs every 6 (six) hours as needed for wheezing (SOB).     Marland Kitchen loratadine (CLARITIN) 10 MG tablet Take 1 tablet (10 mg total) by mouth as needed. 90 tablet 1  . LORazepam (ATIVAN) 0.5 MG tablet Take 1 tablet by mouth at bedtime as need to relax. 30 tablet 0  . non-metallic deodorant (ALRA) MISC Apply 1 application topically daily as needed.    Marland Kitchen omeprazole (PRILOSEC) 40 MG capsule Take 1 capsule (40 mg total) by mouth daily. 30 capsule 2  . zolpidem (AMBIEN) 5 MG  tablet Take 1 tablet (5 mg total) by mouth at bedtime as needed for sleep. 90 tablet 3   No current facility-administered medications for this encounter.     ALLERGIES: Review of patient's allergies indicates no known allergies.   LABORATORY DATA:  Lab Results  Component Value Date   WBC 6.4 07/02/2014   HGB 13.3 07/02/2014   HCT 39.9 07/02/2014   MCV 99.0 07/02/2014   PLT 160 07/02/2014   Lab Results  Component Value Date   NA 141 07/02/2014   K 4.0 07/02/2014   CL 103 12/31/2013   CO2 25 07/02/2014   Lab Results  Component Value Date   ALT 21 07/02/2014   AST 18 07/02/2014   ALKPHOS 68 07/02/2014   BILITOT 0.44 07/02/2014     NARRATIVE: [name] was seen today for weekly treatment management. The chart was checked and the patient's films were reviewed.  Weekly rad txs 14/33 left breast erythema, blister starting under inframmary fold, c/o itching ome, nipple area shooting pains occasionally, using rdiaplex gel bid, no appetite took ortho static b/p, drinks plenty water , had nerve block in her back last week,which has helped back pain, Herceptin due next week, starting to get numbness in hands last week and some swelling there , 1:55 PM  BP 133/83 mmHg  Pulse 82  Temp(Src) 98.6 F (37 C) (Oral)  Resp 16  Wt 196 lb 4.8 oz (89.041 kg)  LMP 03/18/2013 BP 133/83 mmHg  Pulse 82  Temp(Src) 98.6 F (37 C) (Oral)  Resp 16  Wt 196 lb 4.8 oz (89.041 kg)  LMP 03/18/2013  Wt Readings from Last 3 Encounters:  07/02/14 198 lb (89.812 kg)  06/10/14 199 lb 9.6 oz (90.538 kg)  05/29/14 199 lb (90.266 kg)    PHYSICAL EXAMINATION: weight is 196 lb 4.8 oz (89.041 kg). Her oral temperature is 98.6 F (37 C). Her blood pressure is 133/83 and her pulse is 82. Her respiration is 16.      a couple of very small vesicles are present in the inframammary region. Overall her skin looks very good.  ASSESSMENT: The patient is doing satisfactorily with treatment.  PLAN: We will  continue with the patient's radiation treatment as planned.

## 2014-08-01 NOTE — Progress Notes (Signed)
Weekly rad txs 14/33 left breast erythema, blister starting under inframmary fold, c/o itching ome, nipple area shooting pains occasionally, using rdiaplex gel bid, no appetite took ortho static b/p, drinks plenty water , had nerve block in her back last week,which has helped back pain, Herceptin due next week, starting to get numbness in hands last week and some swelling there , 12:16 PM  BP 133/83 mmHg  Pulse 82  Temp(Src) 98.6 F (37 C) (Oral)  Resp 16  Wt 196 lb 4.8 oz (89.041 kg)  LMP 03/18/2013 BP 126/70 mmHg  Pulse 93  Temp(Src) 98.6 F (37 C) (Oral)  Resp 16  Wt 196 lb 4.8 oz (89.041 kg)  LMP 03/18/2013  Wt Readings from Last 3 Encounters:  07/02/14 198 lb (89.812 kg)  06/10/14 199 lb 9.6 oz (90.538 kg)  05/29/14 199 lb (90.266 kg)

## 2014-08-04 ENCOUNTER — Ambulatory Visit
Admission: RE | Admit: 2014-08-04 | Discharge: 2014-08-04 | Disposition: A | Discharge status: Home / Self Care | Payer: Medicaid Other | Source: Ambulatory Visit | Attending: Radiation Oncology | Admitting: Radiation Oncology

## 2014-08-04 ENCOUNTER — Ambulatory Visit: Payer: Medicaid Other

## 2014-08-04 DIAGNOSIS — Z510 Encounter for antineoplastic radiation therapy: Secondary | ICD-10-CM | POA: Diagnosis not present

## 2014-08-05 ENCOUNTER — Ambulatory Visit: Payer: Medicaid Other

## 2014-08-05 ENCOUNTER — Ambulatory Visit
Admission: RE | Admit: 2014-08-05 | Discharge: 2014-08-05 | Disposition: A | Discharge status: Home / Self Care | Payer: Medicaid Other | Source: Ambulatory Visit | Attending: Radiation Oncology | Admitting: Radiation Oncology

## 2014-08-05 ENCOUNTER — Encounter: Payer: Self-pay | Admitting: Radiation Oncology

## 2014-08-05 DIAGNOSIS — Z510 Encounter for antineoplastic radiation therapy: Secondary | ICD-10-CM | POA: Diagnosis not present

## 2014-08-06 ENCOUNTER — Ambulatory Visit: Payer: Medicaid Other

## 2014-08-06 ENCOUNTER — Ambulatory Visit
Admission: RE | Admit: 2014-08-06 | Discharge: 2014-08-06 | Disposition: A | Discharge status: Home / Self Care | Payer: Medicaid Other | Source: Ambulatory Visit | Attending: Radiation Oncology | Admitting: Radiation Oncology

## 2014-08-06 DIAGNOSIS — Z510 Encounter for antineoplastic radiation therapy: Secondary | ICD-10-CM | POA: Diagnosis not present

## 2014-08-07 ENCOUNTER — Ambulatory Visit
Admission: RE | Admit: 2014-08-07 | Discharge: 2014-08-07 | Disposition: A | Discharge status: Home / Self Care | Payer: Medicaid Other | Source: Ambulatory Visit | Attending: Radiation Oncology | Admitting: Radiation Oncology

## 2014-08-07 ENCOUNTER — Ambulatory Visit: Payer: Medicaid Other

## 2014-08-07 DIAGNOSIS — Z510 Encounter for antineoplastic radiation therapy: Secondary | ICD-10-CM | POA: Diagnosis not present

## 2014-08-08 ENCOUNTER — Ambulatory Visit: Payer: Medicaid Other

## 2014-08-08 ENCOUNTER — Encounter: Payer: Self-pay | Admitting: Radiation Oncology

## 2014-08-08 ENCOUNTER — Ambulatory Visit
Admission: RE | Admit: 2014-08-08 | Discharge: 2014-08-08 | Disposition: A | Discharge status: Home / Self Care | Payer: Medicaid Other | Source: Ambulatory Visit | Attending: Radiation Oncology | Admitting: Radiation Oncology

## 2014-08-08 VITALS — BP 129/80 | HR 83 | Temp 98.8°F | Wt 196.9 lb

## 2014-08-08 DIAGNOSIS — C50412 Malignant neoplasm of upper-outer quadrant of left female breast: Secondary | ICD-10-CM

## 2014-08-08 DIAGNOSIS — Z510 Encounter for antineoplastic radiation therapy: Secondary | ICD-10-CM | POA: Diagnosis not present

## 2014-08-08 MED ORDER — RADIAPLEXRX EX GEL
Freq: Once | CUTANEOUS | Status: AC
Start: 2014-08-08 — End: 2014-08-08
  Administered 2014-08-08: 14:00:00 via TOPICAL

## 2014-08-08 NOTE — Progress Notes (Signed)
Weekly assessment of radiation to left breast.Completed 19 of 33 treatments.Has some discomfort of left axilla.Skin mildly red with ras in center chest.Continue hydrocortisone 1% for itching and radiaplex 2 to 3 times daily.Given another tube of radiaplex.

## 2014-08-08 NOTE — Progress Notes (Signed)
Department of Radiation Oncology  Phone:  [phone] Fax:        [phone]  Weekly Treatment Note    Name: Jessica [name] Date: 08/08/2014 MRN: [id number] DOB: September 27, 1965     Current fraction: 19   MEDICATIONS: Current Outpatient Prescriptions  Medication Sig Dispense Refill  . budesonide-formoterol (SYMBICORT) 160-4.5 MCG/ACT inhaler Inhale 1 puff into the lungs 2 (two) times daily as needed (for shortness of breath).     . calcium carbonate (TUMS - DOSED IN MG ELEMENTAL CALCIUM) 500 MG chewable tablet Chew 1 tablet by mouth daily.    . cyclobenzaprine (FLEXERIL) 5 MG tablet TAKE 1 TABLET BY MOUTH TWICE A DAY AS NEEDED FOR MUSCLE SPASMS 60 tablet 0  . eszopiclone (LUNESTA) 1 MG TABS tablet Take 1 tablet (1 mg total) by mouth at bedtime as needed for sleep. Take immediately before bedtime 30 tablet 0  . fluticasone (FLONASE) 50 MCG/ACT nasal spray Place 2 sprays into both nostrils daily. 16 g 2  . gabapentin (NEURONTIN) 300 MG capsule Take 1 capsule (300 mg total) by mouth 3 (three) times daily. (Patient taking differently: Take 300 mg by mouth daily as needed (for legs). ) 90 capsule 3  . hyaluronate sodium (RADIAPLEXRX) GEL Apply 1 application topically 2 (two) times daily.    Marland Kitchen HYDROcodone-acetaminophen (NORCO/VICODIN) 5-325 MG per tablet Take 1 tablet by mouth every 6 (six) hours as needed for moderate pain. 60 tablet 0  . ipratropium (ATROVENT HFA) 17 MCG/ACT inhaler Inhale 1 puff into the lungs every 6 (six) hours as needed for wheezing (SOB).     Marland Kitchen loratadine (CLARITIN) 10 MG tablet Take 1 tablet (10 mg total) by mouth as needed. 90 tablet 1  . LORazepam (ATIVAN) 0.5 MG tablet Take 1 tablet by mouth at bedtime as need to relax. 30 tablet 0  . non-metallic deodorant (ALRA) MISC Apply 1 application topically daily as needed.    Marland Kitchen omeprazole (PRILOSEC) 40 MG capsule Take 1 capsule (40 mg total) by mouth daily. 30 capsule 2  . zolpidem (AMBIEN) 5 MG tablet Take 1 tablet  (5 mg total) by mouth at bedtime as needed for sleep. 90 tablet 3   No current facility-administered medications for this encounter.     ALLERGIES: Review of patient's allergies indicates no known allergies.   LABORATORY DATA:  Lab Results  Component Value Date   WBC 6.4 07/02/2014   HGB 13.3 07/02/2014   HCT 39.9 07/02/2014   MCV 99.0 07/02/2014   PLT 160 07/02/2014   Lab Results  Component Value Date   NA 141 07/02/2014   K 4.0 07/02/2014   CL 103 12/31/2013   CO2 25 07/02/2014   Lab Results  Component Value Date   ALT 21 07/02/2014   AST 18 07/02/2014   ALKPHOS 68 07/02/2014   BILITOT 0.44 07/02/2014     NARRATIVE: Jessica [name] was seen today for weekly treatment management. The chart was checked and the patient's films were reviewed.  Weekly assessment of radiation to left breast.Completed 19 of 33 treatments.Has some discomfort of left axilla.Skin mildly red with ras in center chest.Continue hydrocortisone 1% for itching and radiaplex 2 to 3 times daily.Given another tube of radiaplex.  PHYSICAL EXAMINATION: weight is 196 lb 14.4 oz (89.313 kg). Her temperature is 98.8 F (37.1 C). Her blood pressure is 129/80 and her pulse is 83.      erythema with hyperpigmentation present in the treatment area. No significant desquamation.  ASSESSMENT: The  patient is doing satisfactorily with treatment.  PLAN: We will continue with the patient's radiation treatment as planned.

## 2014-08-11 ENCOUNTER — Ambulatory Visit: Payer: Medicaid Other

## 2014-08-11 ENCOUNTER — Ambulatory Visit
Admission: RE | Admit: 2014-08-11 | Discharge: 2014-08-11 | Disposition: A | Discharge status: Home / Self Care | Payer: Medicaid Other | Source: Ambulatory Visit | Attending: Radiation Oncology | Admitting: Radiation Oncology

## 2014-08-11 DIAGNOSIS — Z510 Encounter for antineoplastic radiation therapy: Secondary | ICD-10-CM | POA: Diagnosis not present

## 2014-08-12 ENCOUNTER — Ambulatory Visit
Admission: RE | Admit: 2014-08-12 | Discharge: 2014-08-12 | Disposition: A | Discharge status: Home / Self Care | Payer: Medicaid Other | Source: Ambulatory Visit | Attending: Radiation Oncology | Admitting: Radiation Oncology

## 2014-08-12 ENCOUNTER — Other Ambulatory Visit (HOSPITAL_BASED_OUTPATIENT_CLINIC_OR_DEPARTMENT_OTHER): Payer: Medicaid Other

## 2014-08-12 ENCOUNTER — Ambulatory Visit: Payer: Medicaid Other

## 2014-08-12 ENCOUNTER — Encounter: Payer: Self-pay | Admitting: Nurse Practitioner

## 2014-08-12 ENCOUNTER — Ambulatory Visit (HOSPITAL_BASED_OUTPATIENT_CLINIC_OR_DEPARTMENT_OTHER): Payer: Medicaid Other

## 2014-08-12 ENCOUNTER — Telehealth: Payer: Self-pay | Admitting: Oncology

## 2014-08-12 ENCOUNTER — Other Ambulatory Visit: Payer: Self-pay | Admitting: Radiation Oncology

## 2014-08-12 ENCOUNTER — Ambulatory Visit (HOSPITAL_BASED_OUTPATIENT_CLINIC_OR_DEPARTMENT_OTHER): Payer: Medicaid Other | Admitting: Nurse Practitioner

## 2014-08-12 ENCOUNTER — Other Ambulatory Visit: Payer: Self-pay | Admitting: Nurse Practitioner

## 2014-08-12 VITALS — BP 146/83 | HR 91 | Temp 97.9°F | Resp 18 | Ht 65.0 in | Wt 194.3 lb

## 2014-08-12 DIAGNOSIS — J449 Chronic obstructive pulmonary disease, unspecified: Secondary | ICD-10-CM | POA: Diagnosis not present

## 2014-08-12 DIAGNOSIS — M8580 Other specified disorders of bone density and structure, unspecified site: Secondary | ICD-10-CM | POA: Diagnosis not present

## 2014-08-12 DIAGNOSIS — G620 Drug-induced polyneuropathy: Secondary | ICD-10-CM

## 2014-08-12 DIAGNOSIS — C50412 Malignant neoplasm of upper-outer quadrant of left female breast: Secondary | ICD-10-CM | POA: Diagnosis present

## 2014-08-12 DIAGNOSIS — Z5112 Encounter for antineoplastic immunotherapy: Secondary | ICD-10-CM

## 2014-08-12 DIAGNOSIS — Z510 Encounter for antineoplastic radiation therapy: Secondary | ICD-10-CM | POA: Diagnosis not present

## 2014-08-12 DIAGNOSIS — T451X5A Adverse effect of antineoplastic and immunosuppressive drugs, initial encounter: Secondary | ICD-10-CM

## 2014-08-12 LAB — COMPREHENSIVE METABOLIC PANEL (CC13)
ALT: 17 U/L (ref 0–55)
AST: 13 U/L (ref 5–34)
Albumin: 4.3 g/dL (ref 3.5–5.0)
Alkaline Phosphatase: 92 U/L (ref 40–150)
Anion Gap: 16 mEq/L — ABNORMAL HIGH (ref 3–11)
BUN: 9.7 mg/dL (ref 7.0–26.0)
CO2: 22 meq/L (ref 22–29)
Calcium: 10.1 mg/dL (ref 8.4–10.4)
Chloride: 104 mEq/L (ref 98–109)
Creatinine: 0.8 mg/dL (ref 0.6–1.1)
EGFR: 86 mL/min/{1.73_m2} — ABNORMAL LOW (ref >90)
Glucose: 114 mg/dl (ref 70–140)
Potassium: 4 mEq/L (ref 3.5–5.1)
Sodium: 141 mEq/L (ref 136–145)
Total Bilirubin: 0.39 mg/dL (ref 0.20–1.20)
Total Protein: 7.4 g/dL (ref 6.4–8.3)

## 2014-08-12 LAB — CBC WITH DIFFERENTIAL/PLATELET
BASO%: 0.1 % (ref 0.0–2.0)
BASOS ABS: 0 10*3/uL (ref 0.0–0.1)
EOS%: 0.1 % (ref 0.0–7.0)
Eosinophils Absolute: 0 10*3/uL (ref 0.0–0.5)
HEMATOCRIT: 41.5 % (ref 34.8–46.6)
HEMOGLOBIN: 14.6 g/dL (ref 11.6–15.9)
LYMPH#: 0.7 10*3/uL — AB (ref 0.9–3.3)
LYMPH%: 5.6 % — AB (ref 14.0–49.7)
MCH: 32.8 pg (ref 25.1–34.0)
MCHC: 35.2 g/dL (ref 31.5–36.0)
MCV: 93.3 fL (ref 79.5–101.0)
MONO#: 0.4 10*3/uL (ref 0.1–0.9)
MONO%: 3.3 % (ref 0.0–14.0)
NEUT#: 10.7 10*3/uL — ABNORMAL HIGH (ref 1.5–6.5)
NEUT%: 90.9 % — ABNORMAL HIGH (ref 38.4–76.8)
PLATELETS: 155 10*3/uL (ref 145–400)
RBC: 4.45 10*6/uL (ref 3.70–5.45)
RDW: 13.5 % (ref 11.2–14.5)
WBC: 11.7 10*3/uL — AB (ref 3.9–10.3)

## 2014-08-12 MED ORDER — SODIUM CHLORIDE 0.9 % IJ SOLN
10.0000 mL | INTRAMUSCULAR | Status: DC | PRN
  Administered 2014-08-12: 10 mL
  Filled 2014-08-12: qty 10

## 2014-08-12 MED ORDER — ACETAMINOPHEN 325 MG PO TABS
ORAL_TABLET | ORAL | Status: AC
  Filled 2014-08-12: qty 2

## 2014-08-12 MED ORDER — DIPHENHYDRAMINE HCL 25 MG PO CAPS
25.0000 mg | ORAL_CAPSULE | Freq: Once | ORAL | Status: AC
  Administered 2014-08-12: 25 mg via ORAL

## 2014-08-12 MED ORDER — ACETAMINOPHEN 325 MG PO TABS
650.0000 mg | ORAL_TABLET | Freq: Once | ORAL | Status: AC
  Administered 2014-08-12: 650 mg via ORAL

## 2014-08-12 MED ORDER — HEPARIN SOD (PORK) LOCK FLUSH 100 UNIT/ML IV SOLN
500.0000 [IU] | Freq: Once | INTRAVENOUS | Status: AC | PRN
  Administered 2014-08-12: 500 [IU]
  Filled 2014-08-12: qty 5

## 2014-08-12 MED ORDER — GABAPENTIN 300 MG PO CAPS: 300.0000 mg | ORAL_CAPSULE | Freq: Three times a day (TID) | ORAL | Status: DC

## 2014-08-12 MED ORDER — SODIUM CHLORIDE 0.9 % IV SOLN
Freq: Once | INTRAVENOUS | Status: AC
  Administered 2014-08-12: 11:00:00 via INTRAVENOUS

## 2014-08-12 MED ORDER — DIPHENHYDRAMINE HCL 25 MG PO CAPS
ORAL_CAPSULE | ORAL | Status: AC
  Filled 2014-08-12: qty 1

## 2014-08-12 MED ORDER — TRASTUZUMAB CHEMO INJECTION 440 MG
6.0000 mg/kg | Freq: Once | INTRAVENOUS | Status: AC
  Administered 2014-08-12: 546 mg via INTRAVENOUS
  Filled 2014-08-12: qty 26

## 2014-08-12 NOTE — Patient Instructions (Signed)
[facility] Discharge Instructions for Patients Receiving Chemotherapy  Today you received the following chemotherapy agents:  Herceptin  To help prevent nausea and vomiting after your treatment, we encourage you to take your nausea medication as prescribed.   If you develop nausea and vomiting that is not controlled by your nausea medication, call the clinic.   BELOW ARE SYMPTOMS THAT SHOULD BE REPORTED IMMEDIATELY:  *FEVER GREATER THAN 100.5 F  *CHILLS WITH OR WITHOUT FEVER  NAUSEA AND VOMITING THAT IS NOT CONTROLLED WITH YOUR NAUSEA MEDICATION  *UNUSUAL SHORTNESS OF BREATH  *UNUSUAL BRUISING OR BLEEDING  TENDERNESS IN MOUTH AND THROAT WITH OR WITHOUT PRESENCE OF ULCERS  *URINARY PROBLEMS  *BOWEL PROBLEMS  UNUSUAL RASH Items with * indicate a potential emergency and should be followed up as soon as possible.  Feel free to call the clinic you have any questions or concerns. The clinic phone number is [phone].  Please show the CHEMO ALERT CARD at check-in to the Emergency Department and triage nurse.   

## 2014-08-12 NOTE — Progress Notes (Signed)
[facility]  [phone] [phone]     ID: Jessica [name] DOB: 04/07/1966  MR#: [id number]  JYN#:[id number]  Patient Care Team: [name], MD as PCP - General (Nurse Practitioner) [name], MD as Consulting Physician (General Surgery) [name], MD as Consulting Physician (Oncology) Kyung Rudd, MD as Consulting Physician (Radiation Oncology)  OTHER: Melina Schools MD, Philis Fendt MD  CHIEF COMPLAINT: Newly diagnosed triple positive breast cancer  CURRENT TREATMENT: Herceptin; radiation  BREAST CANCER HISTORY: From the original intake note:  Jessica [name] herself palpated a mass in her left breast. She brought it to the attention of [name] at the health department and she was set up for unilateral left mammography and ultrasound 11/27/2013 at the [facility]. This showed a possible distortion in the left breast upper outer quadrant. However the patient's breasts are density category D. On exam there was a firm nodule or mass in the left breast 2:00 location which by ultrasound was irregular and hypoechoic and measured 1.5 cm. There was no left axillary lymphadenopathy noted.  Biopsy of the mass in question 11/29/2013 showed ([id number]) an invasive ductal carcinoma, grade 1, estrogen receptor 94% positive, progesterone receptor 94% positive, both with strong staining intensity, with an MIB-1 of 46%, and HER-2 amplified by CISH, the signals ratio of 4.85 and the number per cell 6.55.  : 12/08/2013 the patient underwent bilateral breast MRI. This showed in the left breast upper outer quadrant a 2.9 cm irregular enhancing mass and extending posteriorly from this an area of clumped non-masslike enhancement extending approximately 6 cm and leading to a posterior group of nodules which were small but measured in aggregate 2.2 cm. In addition, there was a separate 0.8 cm irregular spiculated enhancing mass in the upper inner aspect  of the left breast. In the lower outer left breast there was a 0.8 cm oval enhancing mass. There were no abnormal appearing lymph nodes.  [name] subsequent history is as detailed below  INTERVAL HISTORY: Jessica [name] returns today for follow up of her breast cancer. She continues on radiation therapy daily, and is due for trastuzumab today. She is managing both treatments well with no complaints. Her left chest is hyperpigmented but the skin is intact.   REVIEW OF SYSTEMS: Jessica [name] has been working with Dr. Rolena Infante to manage her back pain. Recently she has had a nerve block to the affected area. She has also had a cortisone injection to her left thumb trigger finger this week. She was told that it would take some time to set in. At this time she still cannot move the thumb and her back pain is still present. She does not sleep well even though she alternates [place] and ambien prescribed by her PCP. She is using the gabapentin, but needs to go back to TID dosing as her neuropathy is back and her hot flashes are more intense. A detailed review of systems is otherwise stable.   PAST MEDICAL HISTORY: Past Medical History  Diagnosis Date  . Anxiety   . COPD (chronic obstructive pulmonary disease) 2011    does not use inhaler ofter  . Depression   . Hot flashes   . Family history of anesthesia complication     Had a hard time waking father up only once after several procedures  . Shortness of breath     with exertion  . Chronic heartburn   . PVC (premature ventricular contraction)     PMH;at 49 y.o.  Marland Kitchen  Asthma     denies history of asthma  . Heart murmur     PMH: at age 88; no longer have murmur  . 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 Cancer     DCIS    PAST SURGICAL HISTORY: Past Surgical History  Procedure Laterality Date  . Polyp removal     . Tonsillectomy    . Wisdom tooth extraction    . Robotic assisted total hysterectomy with bilateral  salpingo oopherectomy Bilateral 05/24/2013    Procedure: ROBOTIC ASSISTED TOTAL HYSTERECTOMY WITH BILATERAL SALPINGECTOMY;  Surgeon: Lavonia Drafts, MD;  Location: [facility];  Service: Gynecology;  Laterality: Bilateral;  . Cystoscopy N/A 05/24/2013    Procedure: CYSTOSCOPY;  Surgeon: Lavonia Drafts, MD;  Location: [facility];  Service: Gynecology;  Laterality: N/A;  . Abdominal hysterectomy    . Breast surgery      left breast biopsy x 2  . Portacath placement Right 01/02/2014    Procedure: INSERTION PORT-A-CATH;  Surgeon: [name], MD;  Location: [facility];  Service: General;  Laterality: Right;  . Radioactive seed guided mastectomy with axillary sentinel lymph node biopsy Left 05/29/2014    Procedure: RADIOACTIVE SEED GUIDED LEFT PARTIAL MASTECTOMY WITH AXILLARY SENTINEL LYMPH NODE BIOPSY;  Surgeon: [name], MD;  Location: [facility];  Service: General;  Laterality: Left;    FAMILY HISTORY Family History  Problem Relation Age of Onset  . Heart disease Mother   . Hypercholesterolemia Mother   . Heart disease Father   . COPD Father   . Hypercholesterolemia Father   . Cancer Neg Hx   [name] parents are still living, her father is 34 years old and her mother 48 years old as of August 2015. The patient had no brothers, one sister. There is no history of breast or ovarian cancer in the family.  GYNECOLOGIC HISTORY:  Patient's last menstrual period was 03/18/2013. Menarche age 60. She is GX P0. She took oral contraceptives for many years as well as Depo Provera shots. She is status post hysterectomy with bilateral salpingectomy. 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 She scans at work, and she also does all the shelving and stocking. She is single but for the last 11 years as lives with her significant other Jessica [name]. He is disabled secondary to multiple orthopedic problems including bilateral  avascular necrosis of the hips. He also had a R-body CVA January 2016. Both of them do smoke.    ADVANCED DIRECTIVES: Not in place    HEALTH MAINTENANCE: History  Substance Use Topics  . Smoking status: Current Every [name] Smoker -- 1.00 packs/[name] for 16 years    Types: Cigarettes  . Smokeless tobacco: Never Used     Comment: 1/2ppd decreased  . Alcohol Use: 0.0 oz/week    0 Standard drinks or equivalent per week     Comment: occasional      Colonoscopy:  PAP: January 2015   Bone density:  Lipid panel:  No Known Allergies  Current Outpatient Prescriptions  Medication Sig Dispense Refill  . calcium carbonate (TUMS - DOSED IN MG ELEMENTAL CALCIUM) 500 MG chewable tablet Chew 1 tablet by mouth daily.    . cyclobenzaprine (FLEXERIL) 5 MG tablet TAKE 1 TABLET BY MOUTH TWICE A [name] AS NEEDED FOR MUSCLE SPASMS 60 tablet 0  . eszopiclone (LUNESTA) 1 MG TABS tablet Take 1 tablet (  1 mg total) by mouth at bedtime as needed for sleep. Take immediately before bedtime 30 tablet 0  . gabapentin (NEURONTIN) 300 MG capsule Take 1 capsule (300 mg total) by mouth 3 (three) times daily. 90 capsule 3  . hyaluronate sodium (RADIAPLEXRX) GEL Apply 1 application topically 2 (two) times daily.    Marland Kitchen ipratropium (ATROVENT HFA) 17 MCG/ACT inhaler Inhale 1 puff into the lungs every 6 (six) hours as needed for wheezing (SOB).     Marland Kitchen loratadine (CLARITIN) 10 MG tablet Take 1 tablet (10 mg total) by mouth as needed. 90 tablet 1  . non-metallic deodorant (ALRA) MISC Apply 1 application topically daily as needed.    Marland Kitchen omeprazole (PRILOSEC) 40 MG capsule Take 1 capsule (40 mg total) by mouth daily. 30 capsule 2  . zolpidem (AMBIEN) 5 MG tablet Take 1 tablet (5 mg total) by mouth at bedtime as needed for sleep. 90 tablet 3  . budesonide-formoterol (SYMBICORT) 160-4.5 MCG/ACT inhaler Inhale 1 puff into the lungs 2 (two) times daily as needed (for shortness of breath).     . fluticasone (FLONASE) 50 MCG/ACT nasal spray  Place 2 sprays into both nostrils daily. (Patient not taking: Reported on 08/12/2014) 16 g 2  . HYDROcodone-acetaminophen (NORCO/VICODIN) 5-325 MG per tablet Take 1 tablet by mouth every 6 (six) hours as needed for moderate pain. (Patient not taking: Reported on 08/12/2014) 60 tablet 0  . LORazepam (ATIVAN) 0.5 MG tablet Take 1 tablet by mouth at bedtime as need to relax. (Patient not taking: Reported on 08/12/2014) 30 tablet 0   No current facility-administered medications for this visit.    OBJECTIVE: middle-aged white woman in no acute distress  Filed Vitals:   08/12/14 0956  BP: 146/83  Pulse: 91  Temp: 97.9 F (36.6 C)  Resp: 18     Body mass index is 32.33 kg/(m^2).    ECOG FS:1 - Symptomatic but completely ambulatory   Skin: warm, dry  HEENT: sclerae anicteric, conjunctivae pink, oropharynx clear. No thrush or mucositis.  Lymph Nodes: No cervical or supraclavicular lymphadenopathy  Lungs: clear to auscultation bilaterally, no rales, wheezes, or rhonci  Heart: regular rate and rhythm  Abdomen: round, soft, non tender, positive bowel sounds  Musculoskeletal: No focal spinal tenderness, no peripheral edema  Neuro: non focal, well oriented, positive affect  Breasts: deferred   LAB RESULTS:  CMP     Component Value Date/Time   NA 141 07/02/2014 1231   NA 139 12/31/2013 1448   K 4.0 07/02/2014 1231   K 4.6 12/31/2013 1448   CL 103 12/31/2013 1448   CO2 25 07/02/2014 1231   CO2 24 12/31/2013 1448   GLUCOSE 106 07/02/2014 1231   GLUCOSE 93 12/31/2013 1448   BUN 8.0 07/02/2014 1231   BUN 9 12/31/2013 1448   CREATININE 0.8 07/02/2014 1231   CREATININE 0.83 12/31/2013 1448   CALCIUM 9.6 07/02/2014 1231   CALCIUM 8.8 12/31/2013 1448   PROT 6.8 07/02/2014 1231   PROT 7.1 06/04/2013 1511   ALBUMIN 3.9 07/02/2014 1231   ALBUMIN 3.4* 06/04/2013 1511   AST 18 07/02/2014 1231   AST 12 06/04/2013 1511   ALT 21 07/02/2014 1231   ALT 14 06/04/2013 1511   ALKPHOS 68 07/02/2014  1231   ALKPHOS 70 06/04/2013 1511   BILITOT 0.44 07/02/2014 1231   BILITOT 0.5 06/04/2013 1511   GFRNONAA 83* 12/31/2013 1448   GFRAA >90 12/31/2013 1448    I No results found for: SPEP  Lab Results  Component Value Date   WBC 6.4 07/02/2014   NEUTROABS 4.3 07/02/2014   HGB 13.3 07/02/2014   HCT 39.9 07/02/2014   MCV 99.0 07/02/2014   PLT 160 07/02/2014      Chemistry      Component Value Date/Time   NA 141 07/02/2014 1231   NA 139 12/31/2013 1448   K 4.0 07/02/2014 1231   K 4.6 12/31/2013 1448   CL 103 12/31/2013 1448   CO2 25 07/02/2014 1231   CO2 24 12/31/2013 1448   BUN 8.0 07/02/2014 1231   BUN 9 12/31/2013 1448   CREATININE 0.8 07/02/2014 1231   CREATININE 0.83 12/31/2013 1448      Component Value Date/Time   CALCIUM 9.6 07/02/2014 1231   CALCIUM 8.8 12/31/2013 1448   ALKPHOS 68 07/02/2014 1231   ALKPHOS 70 06/04/2013 1511   AST 18 07/02/2014 1231   AST 12 06/04/2013 1511   ALT 21 07/02/2014 1231   ALT 14 06/04/2013 1511   BILITOT 0.44 07/02/2014 1231   BILITOT 0.5 06/04/2013 1511       No results found for: LABCA2  No components found for: LABCA125  No results for input(s): INR in the last 168 hours.  Urinalysis    Component Value Date/Time   COLORURINE YELLOW 06/04/2013 1420   APPEARANCEUR HAZY* 06/04/2013 1420   LABSPEC 1.030 01/27/2014 1350   LABSPEC >1.030* 06/04/2013 1420   PHURINE 6.0 06/04/2013 1420   GLUCOSEU Negative 01/27/2014 1350   GLUCOSEU NEGATIVE 06/04/2013 1420   HGBUR MODERATE* 06/04/2013 1420   HGBUR negative 10/07/2009 1044   BILIRUBINUR NEGATIVE 06/04/2013 1420   KETONESUR NEGATIVE 06/04/2013 1420   PROTEINUR NEGATIVE 06/04/2013 1420   UROBILINOGEN Color Interference 01/27/2014 1350   UROBILINOGEN 0.2 06/04/2013 1420   NITRITE Color Interference 01/27/2014 1350   NITRITE NEGATIVE 06/04/2013 1420   LEUKOCYTESUR NEGATIVE 06/04/2013 1420    STUDIES: Most recent echocardiogram on 07/15/14 shows an EF 60-65%  No  results found.  ASSESSMENT: 49 y.o. BRCA negative [place] woman status post left breast upper outer quadrant biopsy 11/29/2013 for a clinical T2 N0, stage IIA invasive ductal carcinoma, grade 1, triple positive, with an MIB-1 of 46%.  (1) MRI-guided biopsy of two additional areas in the left breast showed only tubular adenomas, no malignancy.   (2) neoadjuvant chemotherapy started 01/14/2014, consisting of carboplatin, docetaxel, trastuzumab and pertuzumab given every 21 days for 6 cycles, with Neulasta on [name] 2. Completed 04/30/14  (3) trastuzumab to be continued to the total one year, through September of 2016  (a) most recent echo 07/15/2014 shows a well-preserved ejection fraction  (4) status post left lumpectomy with axillary sentinel lymph node biopsy on 05/29/2014 showing a residual ypT1c ypN0 invasive ductal carcinoma, grade 2, with negative margins   (5) radiation to follow surgery  (6) antiestrogen therapy to follow radiation  (7) abnormal radiotracer uptake in distal left femur evaluated with CT-guided biopsy 06/20/2014, showing enchondroma, with low cellularity, no mitotic figures, and no atypia.  PLAN: Jessica [name] has a host of back and joint problems, but from a breast cancer standpoint she is doing well. The labs were not drawn before her visit today, so I am sending her back to the lab to have these completed. She will proceed with her next dose of trastuzumab as planned today. Her most recent echocardiogram showed a well preserved ejection fraction.   She requested narcotics for her pain, but I suggested she contact her orthopedic specialist who should be  managing this condition now. I have refilled her gabapentin and she will go back to TID dosing.   Jessica [name] will move on to antiestrogen therapy after radiation is completed. I have placed orders for a bone density scan to be performed before her next visit with Dr. Jana Hakim to aid in this decision. She understands and agrees with  this plan. She has been encouraged to call with any issues that might arise before her next visit.    Jessica Panda, NP   08/12/2014 10:23 AM

## 2014-08-12 NOTE — Telephone Encounter (Signed)
Appointments added and altered,new avs will be printed in chemo room  anne

## 2014-08-13 ENCOUNTER — Ambulatory Visit
Admission: RE | Admit: 2014-08-13 | Discharge: 2014-08-13 | Disposition: A | Discharge status: Home / Self Care | Payer: Medicaid Other | Source: Ambulatory Visit | Attending: Radiation Oncology | Admitting: Radiation Oncology

## 2014-08-13 ENCOUNTER — Ambulatory Visit: Payer: Medicaid Other

## 2014-08-13 DIAGNOSIS — Z510 Encounter for antineoplastic radiation therapy: Secondary | ICD-10-CM | POA: Diagnosis not present

## 2014-08-14 ENCOUNTER — Other Ambulatory Visit: Payer: Self-pay | Admitting: *Deleted

## 2014-08-14 ENCOUNTER — Ambulatory Visit: Payer: Medicaid Other

## 2014-08-14 ENCOUNTER — Ambulatory Visit
Admission: RE | Admit: 2014-08-14 | Discharge: 2014-08-14 | Disposition: A | Discharge status: Home / Self Care | Payer: Medicaid Other | Source: Ambulatory Visit | Attending: Radiation Oncology | Admitting: Radiation Oncology

## 2014-08-14 DIAGNOSIS — Z510 Encounter for antineoplastic radiation therapy: Secondary | ICD-10-CM | POA: Diagnosis not present

## 2014-08-15 ENCOUNTER — Ambulatory Visit
Admission: RE | Admit: 2014-08-15 | Discharge: 2014-08-15 | Disposition: A | Discharge status: Home / Self Care | Payer: Medicaid Other | Source: Ambulatory Visit | Attending: Radiation Oncology | Admitting: Radiation Oncology

## 2014-08-15 ENCOUNTER — Other Ambulatory Visit: Payer: Self-pay | Admitting: Nurse Practitioner

## 2014-08-15 ENCOUNTER — Ambulatory Visit: Payer: Medicaid Other

## 2014-08-15 ENCOUNTER — Encounter: Payer: Self-pay | Admitting: Radiation Oncology

## 2014-08-15 ENCOUNTER — Other Ambulatory Visit: Payer: Self-pay | Admitting: Oncology

## 2014-08-15 VITALS — BP 128/76 | HR 71 | Temp 98.2°F | Resp 16 | Ht 65.0 in | Wt 199.5 lb

## 2014-08-15 DIAGNOSIS — Z510 Encounter for antineoplastic radiation therapy: Secondary | ICD-10-CM | POA: Diagnosis not present

## 2014-08-15 DIAGNOSIS — C50412 Malignant neoplasm of upper-outer quadrant of left female breast: Secondary | ICD-10-CM

## 2014-08-15 MED ORDER — OXYCODONE-ACETAMINOPHEN 5-325 MG PO TABS: 1.0000 | ORAL_TABLET | ORAL | Status: DC | PRN

## 2014-08-15 MED ORDER — BIAFINE EX EMUL
Freq: Once | CUTANEOUS | Status: AC
  Administered 2014-08-15: 16:00:00 via TOPICAL

## 2014-08-15 NOTE — Addendum Note (Signed)
Encounter addended by: Jacqulyn Liner, RN on: 08/15/2014  4:21 PM<BR>     Documentation filed: Orders, Dx Association, Medications, Notes Section, Inpatient [facility]

## 2014-08-15 NOTE — Progress Notes (Signed)
Jessica [name] has completed 24 fractions to her left breast.  She reports pain in her left underarm and breast that she is rating at a 10/10.  She has been taking 3 tramadol and 2 tylenol a day without relief.  She also reports constipation with her last bm on Monday.  She is taking Miralax daily and tool ex lax last night.  She reports she is passing gas.  The skin on her left breast is red.  Her left underarm has a small amount of peeling.  She is using radiaplex and pure aloe vera.  She reports fatigue.  She had herceptin on Tuesday.  BP 128/76 mmHg  Pulse 71  Temp(Src) 98.2 F (36.8 C) (Oral)  Resp 16  Ht 5\' 5"  (1.651 m)  Wt 199 lb 8 oz (90.493 kg)  BMI 33.20 kg/m2  LMP 03/18/2013

## 2014-08-15 NOTE — Progress Notes (Addendum)
Weekly Management Note Current Dose: 43.2  Gy  Projected Dose: 60.4 Gy   Narrative:  The patient presents for routine under treatment assessment.  CBCT/MVCT images/Port film x-rays were reviewed.  The chart was checked. Patient complaints of increasing breast pain not relieved with tylenol or tramadol.  Not sleeping well.   Physical Findings: Weight: 199 lb 8 oz (90.493 kg). Dry desquamation over the left breast. Some moist desquamation in the inframammary fold.   Impression:  The patient is tolerating radiation.  Plan:  Continue treatment as planned. We discussed hydrogel pads to her axilla and neosporin plus pain to the inframammary fold. She was upset with this recommendation. I encouraged her to try ibuprofen or Aleve.  She stated she would quit radiation on Monday  If she did not receive narcotics.    She returned to nursing stating that "upstairs" told her to come back down here for a pain prescription as this was a radiation related pain.  She requested to be seen again.   She stated that she had her hand/thumb worked on by Dr. Caralyn Guile on Monday and did not receive any narcotics from him.   I called her orthopedic surgeon Dr. Rolena Infante' office who stated that she was given 90 Percocet tablets on 4/1. I verified with [facility] outpatient pharmacy that she was given these.  Per his office, she was also given 30 percocet tablets earlier this week that were called in to [place].  Per the [facility] pharmacist, she presented with the script which they did not keep and she was told it was too soon to fill. She states she turned her percocet tablets back into Dr. Rolena Infante and instead he gave her 17 tablets of something else which she had filled at a [facility] due to it being Saturday.   I gave her 30 Percocet tablets and discussed with her that these would be the only narcotics she would be receiving from this office and that I was hesitant to give them to her given the conflicting information I received. She also  received a prescription for Ativan from the med onc NP.    I discussed my concerns for drug diversion with Dr. Jana Hakim and Dr. Rolena Infante.  Dr. Jana Hakim has placed a referral for pain clinic.

## 2014-08-15 NOTE — Progress Notes (Signed)
Jessica [name] was given biafine cream and hydrogel pads and was instructed on how to use them.  She verbalized understanding and agreement.

## 2014-08-15 NOTE — Addendum Note (Signed)
Encounter addended by: Jacqulyn Liner, RN on: 08/15/2014  4:14 PM<BR>     Documentation filed: Inpatient MAR

## 2014-08-18 ENCOUNTER — Ambulatory Visit
Admission: RE | Admit: 2014-08-18 | Discharge: 2014-08-18 | Disposition: A | Discharge status: Home / Self Care | Payer: Medicaid Other | Source: Ambulatory Visit | Attending: Radiation Oncology | Admitting: Radiation Oncology

## 2014-08-18 ENCOUNTER — Ambulatory Visit: Payer: Medicaid Other

## 2014-08-18 DIAGNOSIS — Z510 Encounter for antineoplastic radiation therapy: Secondary | ICD-10-CM | POA: Diagnosis not present

## 2014-08-19 ENCOUNTER — Ambulatory Visit: Payer: Medicaid Other

## 2014-08-19 ENCOUNTER — Ambulatory Visit
Admission: RE | Admit: 2014-08-19 | Discharge: 2014-08-19 | Disposition: A | Discharge status: Home / Self Care | Payer: Medicaid Other | Source: Ambulatory Visit | Attending: Radiation Oncology | Admitting: Radiation Oncology

## 2014-08-19 DIAGNOSIS — Z510 Encounter for antineoplastic radiation therapy: Secondary | ICD-10-CM | POA: Diagnosis not present

## 2014-08-20 ENCOUNTER — Ambulatory Visit: Payer: Medicaid Other | Admitting: Radiation Oncology

## 2014-08-20 ENCOUNTER — Ambulatory Visit
Admission: RE | Admit: 2014-08-20 | Discharge: 2014-08-20 | Disposition: A | Discharge status: Home / Self Care | Payer: Medicaid Other | Source: Ambulatory Visit | Attending: Radiation Oncology | Admitting: Radiation Oncology

## 2014-08-20 ENCOUNTER — Ambulatory Visit: Payer: Medicaid Other

## 2014-08-20 DIAGNOSIS — Z510 Encounter for antineoplastic radiation therapy: Secondary | ICD-10-CM | POA: Diagnosis not present

## 2014-08-21 ENCOUNTER — Ambulatory Visit
Admission: RE | Admit: 2014-08-21 | Discharge: 2014-08-21 | Disposition: A | Discharge status: Home / Self Care | Payer: Medicaid Other | Source: Ambulatory Visit | Attending: Radiation Oncology | Admitting: Radiation Oncology

## 2014-08-21 ENCOUNTER — Ambulatory Visit: Payer: Medicaid Other

## 2014-08-21 DIAGNOSIS — Z510 Encounter for antineoplastic radiation therapy: Secondary | ICD-10-CM | POA: Diagnosis not present

## 2014-08-22 ENCOUNTER — Encounter: Payer: Self-pay | Admitting: *Deleted

## 2014-08-22 ENCOUNTER — Ambulatory Visit: Payer: Medicaid Other

## 2014-08-22 ENCOUNTER — Ambulatory Visit
Admission: RE | Admit: 2014-08-22 | Discharge: 2014-08-22 | Disposition: A | Discharge status: Home / Self Care | Payer: Medicaid Other | Source: Ambulatory Visit | Attending: Radiation Oncology | Admitting: Radiation Oncology

## 2014-08-22 ENCOUNTER — Encounter: Payer: Self-pay | Admitting: Radiation Oncology

## 2014-08-22 ENCOUNTER — Telehealth: Payer: Self-pay | Admitting: *Deleted

## 2014-08-22 VITALS — BP 126/82 | HR 87 | Temp 98.2°F | Resp 20 | Wt 195.3 lb

## 2014-08-22 DIAGNOSIS — Z510 Encounter for antineoplastic radiation therapy: Secondary | ICD-10-CM | POA: Diagnosis not present

## 2014-08-22 DIAGNOSIS — C50412 Malignant neoplasm of upper-outer quadrant of left female breast: Secondary | ICD-10-CM

## 2014-08-22 NOTE — Progress Notes (Signed)
Weekly rad txs, 29 completed left breast erythema,hyperpigmentation, under inframmary fold skin peeling slight,  Using neosporin there, biafine  Elsewhere on breast using hydrogel pads , low energy  Appetite good no nausea 1:43 PM BP 126/82 mmHg  Pulse 87  Temp(Src) 98.2 F (36.8 C) (Oral)  Resp 20  Wt 195 lb 4.8 oz (88.587 kg)  LMP 03/18/2013  Wt Readings from Last 3 Encounters:  08/22/14 195 lb 4.8 oz (88.587 kg)  08/15/14 199 lb 8 oz (90.493 kg)  08/12/14 194 lb 4.8 oz (88.134 kg)

## 2014-08-22 NOTE — Telephone Encounter (Signed)
[place] is ready then called patient she can pick up rx today,patient thanked Dr. Lisbeth Renshaw and this Rn 8:46 AM

## 2014-08-23 NOTE — Progress Notes (Signed)
Department of Radiation Oncology  Phone:  [phone] Fax:        [phone]  Weekly Treatment Note    Name: Jessica [name] Date: 08/23/2014 MRN: [id number] DOB: [birth year]   Current dose: 52.4 Gy  Current fraction: 29   MEDICATIONS: Current Outpatient Prescriptions  Medication Sig Dispense Refill  . acetaminophen (TYLENOL) 325 MG tablet Take 650 mg by mouth every 6 (six) hours as needed.    . budesonide-formoterol (SYMBICORT) 160-4.5 MCG/ACT inhaler Inhale 1 puff into the lungs 2 (two) times daily as needed (for shortness of breath).     . calcium carbonate (TUMS - DOSED IN MG ELEMENTAL CALCIUM) 500 MG chewable tablet Chew 1 tablet by mouth daily.    . cyclobenzaprine (FLEXERIL) 5 MG tablet TAKE 1 TABLET BY MOUTH TWICE DAILY AS NEEDED FOR MUSCLE SPASMS 60 tablet 0  . emollient (BIAFINE) cream Apply topically as needed.    . eszopiclone (LUNESTA) 1 MG TABS tablet Take 1 tablet (1 mg total) by mouth at bedtime as needed for sleep. Take immediately before bedtime 30 tablet 0  . gabapentin (NEURONTIN) 300 MG capsule Take 1 capsule (300 mg total) by mouth 3 (three) times daily. 90 capsule 3  . hyaluronate sodium (RADIAPLEXRX) GEL Apply 1 application topically 2 (two) times daily.    Marland Kitchen HYDROcodone-acetaminophen (NORCO/VICODIN) 5-325 MG per tablet Take 1 tablet by mouth every 6 (six) hours as needed for moderate pain. 60 tablet 0  . ipratropium (ATROVENT HFA) 17 MCG/ACT inhaler Inhale 1 puff into the lungs every 6 (six) hours as needed for wheezing (SOB).     Marland Kitchen loratadine (CLARITIN) 10 MG tablet Take 1 tablet (10 mg total) by mouth as needed. 90 tablet 1  . LORazepam (ATIVAN) 0.5 MG tablet TAKE 1 TABLET BY MOUTH AT BEDTIME AS NEEDED FOR SLEEP 30 tablet 0  . non-metallic deodorant (ALRA) MISC Apply 1 application topically daily as needed.    Marland Kitchen omeprazole (PRILOSEC) 40 MG capsule TAKE 1 CAPSULE BY MOUTH ONCE DAILY 30 capsule 2  . oxyCODONE-acetaminophen (PERCOCET/ROXICET) 5-325 MG per  tablet Take 1 tablet by mouth every 4 (four) hours as needed for severe pain. 40 tablet 0  . polyethylene glycol (MIRALAX / GLYCOLAX) packet Take 17 g by mouth daily.    . Sennosides 15 MG CHEW Chew by mouth.    . traMADol (ULTRAM) 50 MG tablet Take 50 mg by mouth every 6 (six) hours as needed.    . zolpidem (AMBIEN) 5 MG tablet Take 1 tablet (5 mg total) by mouth at bedtime as needed for sleep. 90 tablet 3  . fluticasone (FLONASE) 50 MCG/ACT nasal spray Place 2 sprays into both nostrils daily. (Patient not taking: Reported on 08/22/2014) 16 g 2   No current facility-administered medications for this encounter.     ALLERGIES: Review of patient's allergies indicates no known allergies.   LABORATORY DATA:  Lab Results  Component Value Date   WBC 11.7* 08/12/2014   HGB 14.6 08/12/2014   HCT 41.5 08/12/2014   MCV 93.3 08/12/2014   PLT 155 08/12/2014   Lab Results  Component Value Date   NA 141 08/12/2014   K 4.0 08/12/2014   CL 103 12/31/2013   CO2 22 08/12/2014   Lab Results  Component Value Date   ALT 17 08/12/2014   AST 13 08/12/2014   ALKPHOS 92 08/12/2014   BILITOT 0.39 08/12/2014     NARRATIVE: Jessica [name] was seen today for weekly treatment management. The  chart was checked and the patient's films were reviewed.  Weekly rad txs, 29 completed left breast erythema,hyperpigmentation, under inframmary fold skin peeling slight,  Using neosporin there, biafine  Elsewhere on breast using hydrogel pads , low energy  Appetite good no nausea 7:22 PM BP 126/82 mmHg  Pulse 87  Temp(Src) 98.2 F (36.8 C) (Oral)  Resp 20  Wt 195 lb 4.8 oz (88.587 kg)  LMP 03/18/2013  Wt Readings from Last 3 Encounters:  08/22/14 195 lb 4.8 oz (88.587 kg)  08/15/14 199 lb 8 oz (90.493 kg)  08/12/14 194 lb 4.8 oz (88.134 kg)     PHYSICAL EXAMINATION: weight is 195 lb 4.8 oz (88.587 kg). Her oral temperature is 98.2 F (36.8 C). Her blood pressure is 126/82 and her pulse is 87. Her  respiration is 20.      the patient's skin shows some hyperpigmentation and diffuse erythema. Overall holding up quite well. No moist desquamation.  ASSESSMENT: The patient is doing satisfactorily with treatment.  PLAN: We will continue with the patient's radiation treatment as planned.

## 2014-08-25 ENCOUNTER — Ambulatory Visit: Payer: Medicaid Other

## 2014-08-25 ENCOUNTER — Ambulatory Visit
Admission: RE | Admit: 2014-08-25 | Discharge: 2014-08-25 | Disposition: A | Discharge status: Home / Self Care | Payer: Medicaid Other | Source: Ambulatory Visit | Attending: Radiation Oncology | Admitting: Radiation Oncology

## 2014-08-25 DIAGNOSIS — Z510 Encounter for antineoplastic radiation therapy: Secondary | ICD-10-CM | POA: Diagnosis not present

## 2014-08-26 ENCOUNTER — Ambulatory Visit: Payer: Medicaid Other

## 2014-08-26 ENCOUNTER — Ambulatory Visit
Admission: RE | Admit: 2014-08-26 | Discharge: 2014-08-26 | Disposition: A | Discharge status: Home / Self Care | Payer: Medicaid Other | Source: Ambulatory Visit | Attending: Radiation Oncology | Admitting: Radiation Oncology

## 2014-08-26 DIAGNOSIS — Z510 Encounter for antineoplastic radiation therapy: Secondary | ICD-10-CM | POA: Diagnosis not present

## 2014-08-27 ENCOUNTER — Ambulatory Visit: Payer: Medicaid Other

## 2014-08-27 ENCOUNTER — Ambulatory Visit
Admission: RE | Admit: 2014-08-27 | Discharge: 2014-08-27 | Disposition: A | Discharge status: Home / Self Care | Payer: Medicaid Other | Source: Ambulatory Visit | Attending: Radiation Oncology | Admitting: Radiation Oncology

## 2014-08-27 ENCOUNTER — Other Ambulatory Visit (HOSPITAL_COMMUNITY): Payer: Self-pay | Admitting: Family Medicine

## 2014-08-27 ENCOUNTER — Encounter: Payer: Self-pay | Admitting: Radiation Oncology

## 2014-08-27 ENCOUNTER — Telehealth: Payer: Self-pay | Admitting: Oncology

## 2014-08-27 VITALS — BP 130/73 | HR 81 | Temp 98.2°F | Resp 20 | Wt 194.3 lb

## 2014-08-27 DIAGNOSIS — Z510 Encounter for antineoplastic radiation therapy: Secondary | ICD-10-CM | POA: Diagnosis not present

## 2014-08-27 DIAGNOSIS — R1904 Left lower quadrant abdominal swelling, mass and lump: Secondary | ICD-10-CM

## 2014-08-27 DIAGNOSIS — C50412 Malignant neoplasm of upper-outer quadrant of left female breast: Secondary | ICD-10-CM

## 2014-08-27 DIAGNOSIS — R1032 Left lower quadrant pain: Secondary | ICD-10-CM

## 2014-08-27 NOTE — Telephone Encounter (Signed)
Faxed pt medical records to [facility]  [phone]

## 2014-08-27 NOTE — Progress Notes (Addendum)
Weekly rad txs32/33 left breast, dry desquamation under axilla and inframmary fold, using neosporin those areas radiaplex elsewhere on her breast, erythema , saw a Dr. Creig Hines yesterday her new Primary MD, appetite  Good, energy level low, pain tenderness  Gave 1 month f/u appt.card to patient,  1:24 PM BP 130/73 mmHg  Pulse 81  Temp(Src) 98.2 F (36.8 C) (Oral)  Resp 20  Wt 194 lb 4.8 oz (88.134 kg)  LMP 03/18/2013  Wt Readings from Last 3 Encounters:  08/27/14 194 lb 4.8 oz (88.134 kg)  08/22/14 195 lb 4.8 oz (88.587 kg)  08/15/14 199 lb 8 oz (90.493 kg)

## 2014-08-27 NOTE — Progress Notes (Signed)
Department of Radiation Oncology  Phone:  [phone] Fax:        [phone]  Weekly Treatment Note    Name: Jessica [name] Date: 08/27/2014 MRN: [id number] DOB: 14-Jan-1966   Current dose: 58.6 Gy  Current fraction:32   MEDICATIONS: Current Outpatient Prescriptions  Medication Sig Dispense Refill  . acetaminophen (TYLENOL) 325 MG tablet Take 650 mg by mouth every 6 (six) hours as needed.    . budesonide-formoterol (SYMBICORT) 160-4.5 MCG/ACT inhaler Inhale 1 puff into the lungs 2 (two) times daily as needed (for shortness of breath).     . calcium carbonate (TUMS - DOSED IN MG ELEMENTAL CALCIUM) 500 MG chewable tablet Chew 1 tablet by mouth daily.    . cyclobenzaprine (FLEXERIL) 5 MG tablet TAKE 1 TABLET BY MOUTH TWICE DAILY AS NEEDED FOR MUSCLE SPASMS 60 tablet 0  . emollient (BIAFINE) cream Apply topically as needed.    . eszopiclone (LUNESTA) 1 MG TABS tablet TAKE 1 TABLET BY MOUTH AT BEDTIME AS NEEDED FOR SLEEP. TAKE IMMEDIATELY BEFORE BEDTIME 30 tablet 0  . gabapentin (NEURONTIN) 300 MG capsule Take 1 capsule (300 mg total) by mouth 3 (three) times daily. 90 capsule 3  . hyaluronate sodium (RADIAPLEXRX) GEL Apply 1 application topically 2 (two) times daily.    Marland Kitchen HYDROcodone-acetaminophen (NORCO/VICODIN) 5-325 MG per tablet Take 1 tablet by mouth every 6 (six) hours as needed for moderate pain. 60 tablet 0  . ipratropium (ATROVENT HFA) 17 MCG/ACT inhaler Inhale 1 puff into the lungs every 6 (six) hours as needed for wheezing (SOB).     Marland Kitchen loratadine (CLARITIN) 10 MG tablet Take 1 tablet (10 mg total) by mouth as needed. 90 tablet 1  . LORazepam (ATIVAN) 0.5 MG tablet TAKE 1 TABLET BY MOUTH AT BEDTIME AS NEEDED FOR SLEEP 30 tablet 0  . non-metallic deodorant (ALRA) MISC Apply 1 application topically daily as needed.    Marland Kitchen omeprazole (PRILOSEC) 40 MG capsule TAKE 1 CAPSULE BY MOUTH ONCE DAILY 30 capsule 2  . oxyCODONE-acetaminophen (PERCOCET/ROXICET) 5-325 MG per tablet Take  1 tablet by mouth every 4 (four) hours as needed for severe pain. 40 tablet 0  . polyethylene glycol (MIRALAX / GLYCOLAX) packet Take 17 g by mouth daily.    . Sennosides 15 MG CHEW Chew by mouth.    . traMADol (ULTRAM) 50 MG tablet Take 50 mg by mouth every 6 (six) hours as needed.    . zolpidem (AMBIEN) 5 MG tablet Take 1 tablet (5 mg total) by mouth at bedtime as needed for sleep. 90 tablet 3  . fluticasone (FLONASE) 50 MCG/ACT nasal spray Place 2 sprays into both nostrils daily. (Patient not taking: Reported on 08/27/2014) 16 g 2   No current facility-administered medications for this encounter.     ALLERGIES: Review of patient's allergies indicates no known allergies.   LABORATORY DATA:  Lab Results  Component Value Date   WBC 11.7* 08/12/2014   HGB 14.6 08/12/2014   HCT 41.5 08/12/2014   MCV 93.3 08/12/2014   PLT 155 08/12/2014   Lab Results  Component Value Date   NA 141 08/12/2014   K 4.0 08/12/2014   CL 103 12/31/2013   CO2 22 08/12/2014   Lab Results  Component Value Date   ALT 17 08/12/2014   AST 13 08/12/2014   ALKPHOS 92 08/12/2014   BILITOT 0.39 08/12/2014     NARRATIVE: Jessica [name] was seen today for weekly treatment management. The chart was checked and  the patient's films were reviewed.  Weekly rad txs32/33 left breast, dry desquamation under axilla and inframmary fold, using neosporin those areas radiaplex elsewhere on her breast, erythema , saw a Dr. Creig Hines yesterday her new Primary MD, appetite  Good, energy level low, pain tenderness  Gave 1 month f/u appt.card to patient,  1:37 PM BP 130/73 mmHg  Pulse 81  Temp(Src) 98.2 F (36.8 C) (Oral)  Resp 20  Wt 194 lb 4.8 oz (88.134 kg)  LMP 03/18/2013  Wt Readings from Last 3 Encounters:  08/27/14 194 lb 4.8 oz (88.134 kg)  08/22/14 195 lb 4.8 oz (88.587 kg)  08/15/14 199 lb 8 oz (90.493 kg)    PHYSICAL EXAMINATION: weight is 194 lb 4.8 oz (88.134 kg). Her oral temperature is 98.2 F (36.8 C).  Her blood pressure is 130/73 and her pulse is 81. Her respiration is 20.      Diffuse erythema, minimal desquation under breast  ASSESSMENT: The patient is doing satisfactorily with treatment.  PLAN: We will continue with the patient's radiation treatment as planned. Follow up in 1 month.

## 2014-08-28 ENCOUNTER — Ambulatory Visit: Payer: Medicaid Other

## 2014-08-28 ENCOUNTER — Ambulatory Visit
Admission: RE | Admit: 2014-08-28 | Discharge: 2014-08-28 | Disposition: A | Discharge status: Home / Self Care | Payer: Medicaid Other | Source: Ambulatory Visit | Attending: Nurse Practitioner | Admitting: Nurse Practitioner

## 2014-08-28 ENCOUNTER — Encounter: Payer: Self-pay | Admitting: Radiation Oncology

## 2014-08-28 ENCOUNTER — Ambulatory Visit
Admission: RE | Admit: 2014-08-28 | Discharge: 2014-08-28 | Disposition: A | Discharge status: Home / Self Care | Payer: Medicaid Other | Source: Ambulatory Visit | Attending: Radiation Oncology | Admitting: Radiation Oncology

## 2014-08-28 DIAGNOSIS — C50412 Malignant neoplasm of upper-outer quadrant of left female breast: Secondary | ICD-10-CM

## 2014-08-28 DIAGNOSIS — Z510 Encounter for antineoplastic radiation therapy: Secondary | ICD-10-CM | POA: Diagnosis not present

## 2014-08-28 DIAGNOSIS — M8580 Other specified disorders of bone density and structure, unspecified site: Secondary | ICD-10-CM

## 2014-08-28 MED ORDER — BIAFINE EX EMUL
CUTANEOUS | Status: DC | PRN
  Administered 2014-08-28: 14:00:00 via TOPICAL

## 2014-08-29 ENCOUNTER — Ambulatory Visit: Payer: Medicaid Other

## 2014-08-29 ENCOUNTER — Ambulatory Visit (HOSPITAL_COMMUNITY)
Admission: RE | Admit: 2014-08-29 | Discharge: 2014-08-29 | Disposition: A | Discharge status: Home / Self Care | Payer: Medicaid Other | Source: Ambulatory Visit | Attending: Family Medicine | Admitting: Family Medicine

## 2014-08-29 DIAGNOSIS — R1032 Left lower quadrant pain: Secondary | ICD-10-CM | POA: Diagnosis not present

## 2014-08-29 DIAGNOSIS — M5137 Other intervertebral disc degeneration, lumbosacral region: Secondary | ICD-10-CM | POA: Insufficient documentation

## 2014-08-29 DIAGNOSIS — Z853 Personal history of malignant neoplasm of breast: Secondary | ICD-10-CM | POA: Diagnosis not present

## 2014-08-29 DIAGNOSIS — R1904 Left lower quadrant abdominal swelling, mass and lump: Secondary | ICD-10-CM

## 2014-08-29 MED ORDER — IOHEXOL 300 MG/ML  SOLN
100.0000 mL | Freq: Once | INTRAMUSCULAR | Status: AC | PRN
  Administered 2014-08-29: 100 mL via INTRAVENOUS

## 2014-09-01 ENCOUNTER — Ambulatory Visit: Payer: Medicaid Other

## 2014-09-02 ENCOUNTER — Telehealth: Payer: Self-pay | Admitting: *Deleted

## 2014-09-02 ENCOUNTER — Ambulatory Visit (HOSPITAL_BASED_OUTPATIENT_CLINIC_OR_DEPARTMENT_OTHER): Payer: Medicaid Other

## 2014-09-02 ENCOUNTER — Other Ambulatory Visit (HOSPITAL_BASED_OUTPATIENT_CLINIC_OR_DEPARTMENT_OTHER): Payer: Medicaid Other

## 2014-09-02 ENCOUNTER — Ambulatory Visit: Payer: Medicaid Other

## 2014-09-02 ENCOUNTER — Other Ambulatory Visit: Payer: Medicaid Other

## 2014-09-02 ENCOUNTER — Ambulatory Visit (HOSPITAL_BASED_OUTPATIENT_CLINIC_OR_DEPARTMENT_OTHER): Payer: Medicaid Other | Admitting: Oncology

## 2014-09-02 VITALS — BP 123/74 | HR 85 | Temp 98.0°F | Resp 18 | Ht 65.0 in | Wt 195.0 lb

## 2014-09-02 DIAGNOSIS — R102 Pelvic and perineal pain: Secondary | ICD-10-CM

## 2014-09-02 DIAGNOSIS — Z5112 Encounter for antineoplastic immunotherapy: Secondary | ICD-10-CM | POA: Diagnosis present

## 2014-09-02 DIAGNOSIS — C50412 Malignant neoplasm of upper-outer quadrant of left female breast: Secondary | ICD-10-CM | POA: Diagnosis not present

## 2014-09-02 DIAGNOSIS — Z95828 Presence of other vascular implants and grafts: Secondary | ICD-10-CM

## 2014-09-02 DIAGNOSIS — K760 Fatty (change of) liver, not elsewhere classified: Secondary | ICD-10-CM

## 2014-09-02 LAB — COMPREHENSIVE METABOLIC PANEL (CC13)
ALBUMIN: 4.2 g/dL (ref 3.5–5.0)
ALT: 22 U/L (ref 0–55)
ANION GAP: 15 meq/L — AB (ref 3–11)
AST: 19 U/L (ref 5–34)
Alkaline Phosphatase: 88 U/L (ref 40–150)
BILIRUBIN TOTAL: 0.42 mg/dL (ref 0.20–1.20)
BUN: 11.1 mg/dL (ref 7.0–26.0)
CHLORIDE: 103 meq/L (ref 98–109)
CO2: 24 meq/L (ref 22–29)
Calcium: 9.9 mg/dL (ref 8.4–10.4)
Creatinine: 0.8 mg/dL (ref 0.6–1.1)
EGFR: 84 mL/min/{1.73_m2} — ABNORMAL LOW (ref >90)
Glucose: 91 mg/dl (ref 70–140)
POTASSIUM: 3.8 meq/L (ref 3.5–5.1)
SODIUM: 142 meq/L (ref 136–145)
Total Protein: 7.2 g/dL (ref 6.4–8.3)

## 2014-09-02 LAB — CBC WITH DIFFERENTIAL/PLATELET
BASO%: 0.2 % (ref 0.0–2.0)
BASOS ABS: 0 10*3/uL (ref 0.0–0.1)
EOS%: 5.1 % (ref 0.0–7.0)
Eosinophils Absolute: 0.2 10*3/uL (ref 0.0–0.5)
HCT: 40.5 % (ref 34.8–46.6)
HEMOGLOBIN: 14.2 g/dL (ref 11.6–15.9)
LYMPH#: 1.2 10*3/uL (ref 0.9–3.3)
LYMPH%: 24.7 % (ref 14.0–49.7)
MCH: 32.5 pg (ref 25.1–34.0)
MCHC: 35.1 g/dL (ref 31.5–36.0)
MCV: 92.7 fL (ref 79.5–101.0)
MONO#: 0.3 10*3/uL (ref 0.1–0.9)
MONO%: 6.8 % (ref 0.0–14.0)
NEUT%: 63.2 % (ref 38.4–76.8)
NEUTROS ABS: 3 10*3/uL (ref 1.5–6.5)
Platelets: 125 10*3/uL — ABNORMAL LOW (ref 145–400)
RBC: 4.37 10*6/uL (ref 3.70–5.45)
RDW: 14 % (ref 11.2–14.5)
WBC: 4.7 10*3/uL (ref 3.9–10.3)

## 2014-09-02 MED ORDER — HEPARIN SOD (PORK) LOCK FLUSH 100 UNIT/ML IV SOLN
500.0000 [IU] | Freq: Once | INTRAVENOUS | Status: DC
  Administered 2014-09-02: 500 [IU] via INTRAVENOUS
  Filled 2014-09-02: qty 5

## 2014-09-02 MED ORDER — SODIUM CHLORIDE 0.9 % IJ SOLN
10.0000 mL | INTRAMUSCULAR | Status: DC | PRN
  Administered 2014-09-02: 10 mL via INTRAVENOUS
  Filled 2014-09-02: qty 10

## 2014-09-02 MED ORDER — HEPARIN SOD (PORK) LOCK FLUSH 100 UNIT/ML IV SOLN
500.0000 [IU] | Freq: Once | INTRAVENOUS | Status: DC | PRN
  Filled 2014-09-02: qty 5

## 2014-09-02 MED ORDER — DIPHENHYDRAMINE HCL 25 MG PO CAPS
ORAL_CAPSULE | ORAL | Status: AC
  Filled 2014-09-02: qty 1

## 2014-09-02 MED ORDER — ACETAMINOPHEN 325 MG PO TABS
650.0000 mg | ORAL_TABLET | Freq: Once | ORAL | Status: AC
  Administered 2014-09-02: 650 mg via ORAL

## 2014-09-02 MED ORDER — TRASTUZUMAB CHEMO INJECTION 440 MG
6.0000 mg/kg | Freq: Once | INTRAVENOUS | Status: AC
  Administered 2014-09-02: 546 mg via INTRAVENOUS
  Filled 2014-09-02: qty 26

## 2014-09-02 MED ORDER — ACETAMINOPHEN 325 MG PO TABS
ORAL_TABLET | ORAL | Status: AC
Start: 2014-09-02 — End: 2014-09-02
  Filled 2014-09-02: qty 2

## 2014-09-02 MED ORDER — DIPHENHYDRAMINE HCL 25 MG PO CAPS
25.0000 mg | ORAL_CAPSULE | Freq: Once | ORAL | Status: AC
  Administered 2014-09-02: 25 mg via ORAL

## 2014-09-02 MED ORDER — SODIUM CHLORIDE 0.9 % IJ SOLN
10.0000 mL | INTRAMUSCULAR | Status: DC | PRN
  Administered 2014-09-02: 10 mL
  Filled 2014-09-02: qty 10

## 2014-09-02 MED ORDER — TAMOXIFEN CITRATE 20 MG PO TABS: 20.0000 mg | ORAL_TABLET | Freq: Every day | ORAL | Status: AC

## 2014-09-02 MED ORDER — SODIUM CHLORIDE 0.9 % IV SOLN
Freq: Once | INTRAVENOUS | Status: AC
  Administered 2014-09-02: 11:00:00 via INTRAVENOUS

## 2014-09-02 NOTE — Patient Instructions (Signed)

## 2014-09-02 NOTE — Telephone Encounter (Signed)
Per staff message and POF I have scheduled appts. Advised scheduler of appts. JMW  

## 2014-09-02 NOTE — Patient Instructions (Signed)
[facility] Discharge Instructions for Patients Receiving Chemotherapy  Today you received the following chemotherapy agents: Herceptin   To help prevent nausea and vomiting after your treatment, we encourage you to take your nausea medication as directed.    If you develop nausea and vomiting that is not controlled by your nausea medication, call the clinic.   BELOW ARE SYMPTOMS THAT SHOULD BE REPORTED IMMEDIATELY:  *FEVER GREATER THAN 100.5 F  *CHILLS WITH OR WITHOUT FEVER  NAUSEA AND VOMITING THAT IS NOT CONTROLLED WITH YOUR NAUSEA MEDICATION  *UNUSUAL SHORTNESS OF BREATH  *UNUSUAL BRUISING OR BLEEDING  TENDERNESS IN MOUTH AND THROAT WITH OR WITHOUT PRESENCE OF ULCERS  *URINARY PROBLEMS  *BOWEL PROBLEMS  UNUSUAL RASH Items with * indicate a potential emergency and should be followed up as soon as possible.  Feel free to call the clinic you have any questions or concerns. The clinic phone number is [phone].  Please show the CHEMO ALERT CARD at check-in to the Emergency Department and triage nurse.   

## 2014-09-02 NOTE — Telephone Encounter (Signed)
Patient called to ask if Dr. Ihor Dow removed her appendix during her hysterectomy. I reviewed op note and surg path and there is no mention of appendix. Patient informed.

## 2014-09-02 NOTE — Addendum Note (Signed)
Addended by: Laureen Abrahams on: 09/02/2014 11:18 AM   Modules accepted: Medications

## 2014-09-02 NOTE — Progress Notes (Signed)
[place]  Telephone:([phone]) [phone] [phone]) [phone]     ID: Jessica [name] DOB: July 18, 1965  MR#: [id number]  QQV#:[id number]  Patient Care Team: Chevis Pretty, MD as PCP - General (Family Medicine) [name], MD as Consulting Physician (General Surgery) [name], MD as Consulting Physician (Oncology) Kyung Rudd, MD as Consulting Physician (Radiation Oncology) Melina Schools, MD as Consulting Physician (Orthopedic Surgery)  OTHER: Melina Schools MD, Philis Fendt MD  CHIEF COMPLAINT: Newly diagnosed triple positive breast cancer  CURRENT TREATMENT: Herceptin; radiation  BREAST CANCER HISTORY: From the original intake note:  Jessica [name] palpated a mass in her left breast. She brought it to the attention of [name] at the health department and she was set up for unilateral left mammography and ultrasound 11/27/2013 at the [facility]. This showed a possible distortion in the left breast upper outer quadrant. However the patient's breasts are density category D. On exam there was a firm nodule or mass in the left breast 2:00 location which by ultrasound was irregular and hypoechoic and measured 1.5 cm. There was no left axillary lymphadenopathy noted.  Biopsy of the mass in question 11/29/2013 showed ([id number]) an invasive ductal carcinoma, grade 1, estrogen receptor 94% positive, progesterone receptor 94% positive, both with strong staining intensity, with an MIB-1 of 46%, and HER-2 amplified by CISH, the signals ratio of 4.85 and the number per cell 6.55.  On 12/08/2013 the patient underwent bilateral breast MRI. This showed in the left breast upper outer quadrant a 2.9 cm irregular enhancing mass and extending posteriorly from this an area of clumped non-masslike enhancement extending approximately 6 cm and leading to a posterior group of nodules which were small but measured in aggregate 2.2 cm. In addition, there was a separate 0.8 cm  irregular spiculated enhancing mass in the upper inner aspect of the left breast. In the lower outer left breast there was a 0.8 cm oval enhancing mass. There were no abnormal appearing lymph nodes.  [name] subsequent history is as detailed below  INTERVAL HISTORY: Jessica [name] returns today for follow up of her breast cancer. Since her last visit here she completed her radiation therapy, with the last treatment given 08/27/2014. She generally tolerated these well, with some erythema and mild dry peeling, and some fatigue. She is now ready to start anti-estrogens.  REVIEW OF SYSTEMS: Jessica [name] has established [name] with Dr. Creig Hines and is very pleased with the care she is receiving. She had an injection to the lower back under Dr. Rolena Infante and that "really work" for about a month, although now she is having some back discomfort again. She continues to have lower pelvic discomfort. This was evaluated with transvaginal ultrasonography last August and with a CT scan recently which did not show any specific etiology. She tells me Dr. Creig Hines is planning to refer her to GI. She is having hot flashes, some vaginal dryness, and mild insomnia. A detailed review of systems today was otherwise stable.  PAST MEDICAL HISTORY: Past Medical History  Diagnosis Date  . Anxiety   . COPD (chronic obstructive pulmonary disease) 2011    does not use inhaler ofter  . Depression   . Hot flashes   . Family history of anesthesia complication     Had a hard time waking father up only once after several procedures  . Shortness of breath     with exertion  . Chronic heartburn   . PVC (premature ventricular contraction)     PMH;at  49 y.o.  . Asthma     denies history of asthma  . Heart murmur     PMH: at age 66; no longer have murmur  . 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 Cancer     DCIS    PAST SURGICAL HISTORY: Past Surgical History  Procedure Laterality Date  .  Polyp removal     . Tonsillectomy    . Wisdom tooth extraction    . Robotic assisted total hysterectomy with bilateral salpingo oopherectomy Bilateral 05/24/2013    Procedure: ROBOTIC ASSISTED TOTAL HYSTERECTOMY WITH BILATERAL SALPINGECTOMY;  Surgeon: Lavonia Drafts, MD;  Location: [facility];  Service: Gynecology;  Laterality: Bilateral;  . Cystoscopy N/A 05/24/2013    Procedure: CYSTOSCOPY;  Surgeon: Lavonia Drafts, MD;  Location: [facility];  Service: Gynecology;  Laterality: N/A;  . Abdominal hysterectomy    . Breast surgery      left breast biopsy x 2  . Portacath placement Right 01/02/2014    Procedure: INSERTION PORT-A-CATH;  Surgeon: [name], MD;  Location: [facility];  Service: General;  Laterality: Right;  . Radioactive seed guided mastectomy with axillary sentinel lymph node biopsy Left 05/29/2014    Procedure: RADIOACTIVE SEED GUIDED LEFT PARTIAL MASTECTOMY WITH AXILLARY SENTINEL LYMPH NODE BIOPSY;  Surgeon: [name], MD;  Location: [facility];  Service: General;  Laterality: Left;    FAMILY HISTORY Family History  Problem Relation Age of Onset  . Heart disease Mother   . Hypercholesterolemia Mother   . Heart disease Father   . COPD Father   . Hypercholesterolemia Father   . Cancer Neg Hx   [name] parents are still living, her father is 52 years old and her mother 64 years old as of August 2015. The patient had no brothers, one sister. There is no history of breast or ovarian cancer in the family.  GYNECOLOGIC HISTORY:  Patient's last menstrual period was 03/18/2013. Menarche age 37. She is GX P0. She took oral contraceptives for many years as well as Depo Provera shots. She is status post hysterectomy with bilateral salpingectomy. 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 She scans at work, and she also does all the shelving and stocking. She is single but for the last 11 years as  lives with her significant other [name]. He is disabled secondary to multiple orthopedic problems including bilateral avascular necrosis of the hips. He also had a R-body CVA January 2016. Both of them do smoke.     ADVANCED DIRECTIVES: Not in place    HEALTH MAINTENANCE: History  Substance Use Topics  . Smoking status: Current Every Day Smoker -- 1.00 packs/day for 16 years    Types: Cigarettes  . Smokeless tobacco: Never Used     Comment: 1/2ppd decreased  . Alcohol Use: 0.0 oz/week    0 Standard drinks or equivalent per week     Comment: occasional      Colonoscopy:  PAP: January 2015   Bone density:  Lipid panel:  No Known Allergies  Current Outpatient Prescriptions  Medication Sig Dispense Refill  . acetaminophen (TYLENOL) 325 MG tablet Take 650 mg by mouth every 6 (six) hours as needed.    . budesonide-formoterol (SYMBICORT) 160-4.5 MCG/ACT inhaler Inhale 1 puff into the lungs 2 (two) times daily as needed (for shortness of breath).     . calcium carbonate (TUMS -  DOSED IN MG ELEMENTAL CALCIUM) 500 MG chewable tablet Chew 1 tablet by mouth daily.    . cyclobenzaprine (FLEXERIL) 5 MG tablet TAKE 1 TABLET BY MOUTH TWICE DAILY AS NEEDED FOR MUSCLE SPASMS 60 tablet 0  . emollient (BIAFINE) cream Apply topically as needed. Tube given 08/28/14    . eszopiclone (LUNESTA) 1 MG TABS tablet TAKE 1 TABLET BY MOUTH AT BEDTIME AS NEEDED FOR SLEEP. TAKE IMMEDIATELY BEFORE BEDTIME 30 tablet 0  . fluticasone (FLONASE) 50 MCG/ACT nasal spray Place 2 sprays into both nostrils daily. (Patient not taking: Reported on 08/27/2014) 16 g 2  . gabapentin (NEURONTIN) 300 MG capsule Take 1 capsule (300 mg total) by mouth 3 (three) times daily. 90 capsule 3  . hyaluronate sodium (RADIAPLEXRX) GEL Apply 1 application topically 2 (two) times daily.    Marland Kitchen HYDROcodone-acetaminophen (NORCO/VICODIN) 5-325 MG per tablet Take 1 tablet by mouth every 6 (six) hours as needed for moderate pain. 60 tablet 0  .  ipratropium (ATROVENT HFA) 17 MCG/ACT inhaler Inhale 1 puff into the lungs every 6 (six) hours as needed for wheezing (SOB).     Marland Kitchen loratadine (CLARITIN) 10 MG tablet Take 1 tablet (10 mg total) by mouth as needed. 90 tablet 1  . LORazepam (ATIVAN) 0.5 MG tablet TAKE 1 TABLET BY MOUTH AT BEDTIME AS NEEDED FOR SLEEP 30 tablet 0  . non-metallic deodorant (ALRA) MISC Apply 1 application topically daily as needed.    Marland Kitchen omeprazole (PRILOSEC) 40 MG capsule TAKE 1 CAPSULE BY MOUTH ONCE DAILY 30 capsule 2  . oxyCODONE-acetaminophen (PERCOCET/ROXICET) 5-325 MG per tablet Take 1 tablet by mouth every 4 (four) hours as needed for severe pain. 40 tablet 0  . polyethylene glycol (MIRALAX / GLYCOLAX) packet Take 17 g by mouth daily.    . Sennosides 15 MG CHEW Chew by mouth.    . traMADol (ULTRAM) 50 MG tablet Take 50 mg by mouth every 6 (six) hours as needed.    . zolpidem (AMBIEN) 5 MG tablet Take 1 tablet (5 mg total) by mouth at bedtime as needed for sleep. 90 tablet 3   No current facility-administered medications for this visit.   Facility-Administered Medications Ordered in Other Visits  Medication Dose Route Frequency Provider Last Rate Last Dose  . heparin lock flush 100 unit/mL  500 Units Intravenous Once [name], MD      . sodium chloride 0.9 % injection 10 mL  10 mL Intravenous PRN [name], MD   10 mL at 09/02/14 0907    OBJECTIVE: middle-aged white [name] who appears stated age 23 Vitals:   09/02/14 0922  BP: 123/74  Pulse: 85  Temp: 98 F (36.7 C)  Resp: 18     Body mass index is 32.45 kg/(m^2).    ECOG FS:1 - Symptomatic but completely ambulatory   Sclerae unicteric, pupils round and equal Oropharynx clear and moist No cervical or supraclavicular adenopathy Lungs no rales or rhonchi Heart regular rate and rhythm Abd soft, nontender, positive bowel sounds MSK no focal spinal tenderness to mild palpation, no upper extremity lymphedema Neuro: nonfocal, well  oriented, appropriate affect Breasts: The right breast is unremarkable. The left breast is status post lumpectomy and radiation. There is erythema over the left breast and some dry desquamation in the inferior mammary fold. The left axilla is benign    LAB RESULTS:  CMP     Component Value Date/Time   NA 142 09/02/2014 0901   NA 139 12/31/2013 1448  K 3.8 09/02/2014 0901   K 4.6 12/31/2013 1448   CL 103 12/31/2013 1448   CO2 24 09/02/2014 0901   CO2 24 12/31/2013 1448   GLUCOSE 91 09/02/2014 0901   GLUCOSE 93 12/31/2013 1448   BUN 11.1 09/02/2014 0901   BUN 9 12/31/2013 1448   CREATININE 0.8 09/02/2014 0901   CREATININE 0.83 12/31/2013 1448   CALCIUM 9.9 09/02/2014 0901   CALCIUM 8.8 12/31/2013 1448   PROT 7.2 09/02/2014 0901   PROT 7.1 06/04/2013 1511   ALBUMIN 4.2 09/02/2014 0901   ALBUMIN 3.4* 06/04/2013 1511   AST 19 09/02/2014 0901   AST 12 06/04/2013 1511   ALT 22 09/02/2014 0901   ALT 14 06/04/2013 1511   ALKPHOS 88 09/02/2014 0901   ALKPHOS 70 06/04/2013 1511   BILITOT 0.42 09/02/2014 0901   BILITOT 0.5 06/04/2013 1511   GFRNONAA 83* 12/31/2013 1448   GFRAA >90 12/31/2013 1448    I No results found for: SPEP  Lab Results  Component Value Date   WBC 4.7 09/02/2014   NEUTROABS 3.0 09/02/2014   HGB 14.2 09/02/2014   HCT 40.5 09/02/2014   MCV 92.7 09/02/2014   PLT 125* 09/02/2014      Chemistry      Component Value Date/Time   NA 142 09/02/2014 0901   NA 139 12/31/2013 1448   K 3.8 09/02/2014 0901   K 4.6 12/31/2013 1448   CL 103 12/31/2013 1448   CO2 24 09/02/2014 0901   CO2 24 12/31/2013 1448   BUN 11.1 09/02/2014 0901   BUN 9 12/31/2013 1448   CREATININE 0.8 09/02/2014 0901   CREATININE 0.83 12/31/2013 1448      Component Value Date/Time   CALCIUM 9.9 09/02/2014 0901   CALCIUM 8.8 12/31/2013 1448   ALKPHOS 88 09/02/2014 0901   ALKPHOS 70 06/04/2013 1511   AST 19 09/02/2014 0901   AST 12 06/04/2013 1511   ALT 22 09/02/2014 0901   ALT  14 06/04/2013 1511   BILITOT 0.42 09/02/2014 0901   BILITOT 0.5 06/04/2013 1511       No results found for: LABCA2  No components found for: LABCA125  No results for input(s): INR in the last 168 hours.  Urinalysis    Component Value Date/Time   COLORURINE YELLOW 06/04/2013 1420   APPEARANCEUR HAZY* 06/04/2013 1420   LABSPEC 1.030 01/27/2014 1350   LABSPEC >1.030* 06/04/2013 1420   PHURINE 5.0 01/27/2014 1350   PHURINE 6.0 06/04/2013 1420   GLUCOSEU Negative 01/27/2014 1350   GLUCOSEU NEGATIVE 06/04/2013 1420   HGBUR Negative 01/27/2014 1350   HGBUR MODERATE* 06/04/2013 1420   HGBUR negative 10/07/2009 1044   BILIRUBINUR Color Interference 01/27/2014 1350   BILIRUBINUR NEGATIVE 06/04/2013 1420   KETONESUR Color Interference 01/27/2014 1350   KETONESUR NEGATIVE 06/04/2013 1420   PROTEINUR Color Interference 01/27/2014 1350   PROTEINUR NEGATIVE 06/04/2013 1420   UROBILINOGEN Color Interference 01/27/2014 1350   UROBILINOGEN 0.2 06/04/2013 1420   NITRITE Color Interference 01/27/2014 1350   NITRITE NEGATIVE 06/04/2013 1420   LEUKOCYTESUR Color Interference 01/27/2014 1350   LEUKOCYTESUR NEGATIVE 06/04/2013 1420    STUDIES: Most recent echocardiogram on 07/15/14 shows an EF 60-65%  Ct Abdomen Pelvis W Contrast  08/29/2014   CLINICAL DATA:  Left lower quadrant abdominal pain for 5 months. Personal history breast cancer with ongoing chemotherapy.  EXAM: CT ABDOMEN AND PELVIS WITH CONTRAST  TECHNIQUE: Multidetector CT imaging of the abdomen and pelvis was performed using the standard protocol  following bolus administration of intravenous contrast.  CONTRAST:  170m OMNIPAQUE IOHEXOL 300 MG/ML  SOLN  COMPARISON:  CT abdomen and pelvis 06/04/2013.  FINDINGS: The lung bases are clear without focal nodule, mass, or airspace disease. The heart size is normal. No significant pleural or pericardial effusion is present.  Mild fatty infiltration of the liver is suggested. No focal hepatic  lesions are present. Mild splenomegaly is stable. No focal lesions are present.  The stomach, duodenum, and pancreas are unremarkable. Common bile duct and gallbladder are within normal limits. The adrenal glands are normal bilaterally. The kidneys and ureters are within normal limits. The urinary bladder is unremarkable.  Hysterectomy is noted. The ovaries are visualized and within normal limits for age bilaterally. Rectosigmoid colon is within normal limits. The distal segment is not fully distended but without inflammatory change. No other focal wall thickening or distention is evident. The appendix is surgically absent. Small bowel is within normal limits.  No significant free fluid or adenopathy is present.  The the bone windows demonstrate slight retrolisthesis at L5-S1 with a vacuum disc and endplate changes. No focal lytic or blastic lesions are present otherwise.  IMPRESSION: 1. No acute or focal lesion to explain the patient's symptoms. 2. Mild fatty infiltration of the liver without focal lesion. 3. Grade 1 degenerative retrolisthesis at L5-S1 with endplate and degenerative disc changes.   Electronically Signed   By: CSan MorelleM.D.   On: 08/29/2014 08:52   Dg Bone Density  08/28/2014   CLINICAL DATA:  Postmenopausal. Currently taking Herceptin for breast cancer. Status post hysterectomy at age 49  EXAM: DUAL X-RAY ABSORPTIOMETRY (DXA) FOR BONE MINERAL DENSITY  FINDINGS: AP LUMBAR SPINE  Bone Mineral Density (BMD):  1.088 g/cm2  Young Adult T-Score:  0.4  Z-Score:  1.0  LEFT FEMUR NECK  Bone Mineral Density (BMD):  0.765 g/cm2  Young Adult T-Score: -0.8  Z-Score:  -0.2  ASSESSMENT: Patient's diagnostic category is NORMAL by WHO Criteria.  FRACTURE RISK: NOT INCREASED  FRAX: World Health Organization FRAX assessment of absolute fracture risk is not calculated for this patient because the patient has normal bone density.  COMPARISON: None.  Effective therapies are available in the form of  bisphosphonates, selective estrogen receptor modulators, biologic agents, and hormone replacement therapy (for women). All patients should ensure an adequate intake of dietary calcium (1200 mg daily) and vitamin D (800 IU daily) unless contraindicated.  All treatment decisions require clinical judgment and consideration of individual patient factors, including patient preferences, co-morbidities, previous drug use, risk factors not captured in the FRAX model (e.g., frailty, falls, vitamin D deficiency, increased bone turnover, interval significant decline in bone density) and possible under- or over-estimation of fracture risk by FRAX.  The National Osteoporosis Foundation recommends that FDA-approved medical therapies be considered in postmenopausal women and men age 8277or older with a:  1. Hip or vertebral (clinical or morphometric) fracture.  2. T-score of -2.5 or lower at the spine or hip.  3. Ten-year fracture probability by FRAX of 3% or greater for hip fracture or 20% or greater for major osteoporotic fracture.  People with diagnosed cases of osteoporosis or at high risk for fracture should have regular bone mineral density tests. For patients eligible for Medicare, routine testing is allowed once every 2 years. The testing frequency can be increased to one year for patients who have rapidly progressing disease, those who are receiving or discontinuing medical therapy to restore bone mass, or have additional risk  factors.  World Pharmacologist [facility]) Criteria:  Normal: T-scores from +1.0 to -1.0  Low Bone Mass (Osteopenia): T-scores between -1.0 and -2.5  Osteoporosis: T-scores -2.5 and below  Comparison to Reference Population:  T-score is the key measure used in the diagnosis of osteoporosis and relative risk determination for fracture. It provides a value for bone mass relative to the mean bone mass of a young adult reference population expressed in terms of standard deviation (SD).  Z-score is the  age-matched score showing the patient's values compared to a population matched for age, sex, and race. This is also expressed in terms of standard deviation. The patient may have values that compare favorably to the age-matched values and still be at increased risk for fracture.   Electronically Signed   By: Claudie Revering M.D.   On: 08/28/2014 13:16    ASSESSMENT: 49 y.o. BRCA negative Jessica [name] status post left breast upper outer quadrant biopsy 11/29/2013 for a clinical T2 N0, stage IIA invasive ductal carcinoma, grade 1, triple positive, with an MIB-1 of 46%.  (1) MRI-guided biopsy of two additional areas in the left breast showed only tubular adenomas, no malignancy.   (2) neoadjuvant chemotherapy started 01/14/2014, consisting of carboplatin, docetaxel, trastuzumab and pertuzumab given every 21 days for 6 cycles, with Neulasta on day 2. Completed 04/30/14  (3) trastuzumab to be continued to the total one year, through September of 2016  (a) most recent echo 07/15/2014 shows a well-preserved ejection fraction  (4) status post left lumpectomy with axillary sentinel lymph node biopsy on 05/29/2014 showing a residual ypT1c ypN0 invasive ductal carcinoma, grade 2, with negative margins   (5) adjuvant radiation completed 08/27/2014  (6) to start tamoxifen 09/30/2014  (7) abnormal radiotracer uptake in distal left femur evaluated with CT-guided biopsy 06/20/2014, showing enchondroma, with low cellularity, no mitotic figures, and no atypia.  (8) persistent low pelvic discomfort, with unremarkable transvaginal ultrasonography 12/12/2013 and CT of the abdomen and pelvis 08/29/2014  PLAN: Jessica [name] has completed her local treatment for breast cancer, namely surgery and radiation. She has done well with those. She also did well with her chemotherapy. Of course she continues on trastuzumab every 3 weeks, and will need an additional echocardiogram late June 2016.  She is now ready to begin the  final part of her systemic therapy, which will consist of anti-estrogens for 5 years. We discussed the difference between tamoxifen and anastrozole in detail. She has a good understanding of the possible toxicities, side effects and complications of these agents. After much discussion and chiefly because she is status post hysterectomy, we are going to start with tamoxifen. After 2-3 years we will switch to anastrozole so she can complete her treatments in 5 years.   It was favorable that her bone density was normal. She understands tamoxifen actually will help preserve and perhaps improve her bone density.  We're going to wait a few weeks until she more fully recovers from radiation, so she will start tamoxifen mid June. She will return to see [place] early September to make sure she is tolerating it well. If she is we will start every 3 month follow-up alternating with her surgeon Dr. Donne Hazel. I free see her in September, he could see her in December and so on for the first year, after which we would lengthen the follow-up interval to every 6 months.  I also gave her a copy of our "intimacy and pelvic health" pamphlet. We did discuss her lower pelvic pain, and  she tells me Dr. Creig Hines is going to be referring her to a GI physician for further evaluation  She has a good understanding of the overall plan. She agrees with it. She knows the goal of treatment in her case is cure. She will call with any problems that may develop before the next visit here.   [name], MD   09/02/2014 10:16 AM

## 2014-09-03 ENCOUNTER — Ambulatory Visit: Payer: Medicaid Other

## 2014-09-16 ENCOUNTER — Encounter: Payer: Self-pay | Admitting: Gastroenterology

## 2014-09-17 ENCOUNTER — Telehealth: Payer: Self-pay | Admitting: Oncology

## 2014-09-17 NOTE — Telephone Encounter (Signed)
Confirmed appointment for ECHO. °

## 2014-09-19 ENCOUNTER — Encounter: Payer: Self-pay | Admitting: Radiation Oncology

## 2014-09-19 NOTE — Progress Notes (Signed)
Complex simulation note  Diagnosis: Left-sided breast cancer  Narrative The patient has initially been planned to receive a course of whole breast radiation to a dose of 50.4 Gy in 28 fractions. The patient will now receive an additional boost to the seroma cavity which has been contoured. This will correspond to a boost of 10 Gy at 2 Gy per fraction. To accomplish this, an additional 3 customized blocks have been designed for this purpose. A complex isodose plan is requested to ensure that the target area is adequately covered with radiation dose and that the nearby normal structures such as the lung are adequately spared. [name] final total dose will be 60.4 Gy.  ------------------------------------------------  Jessica Gross, MD, PhD

## 2014-09-19 NOTE — Progress Notes (Signed)
  Radiation Oncology         [phone] ________________________________  Name: Jessica [name] MRN: [id number]  Date: 08/28/2014  DOB: [birth year]  End of Treatment Note  Diagnosis:   Left-sided breast cancer     Indication for treatment:  Curative       Radiation treatment dates:   07/14/2014 through 08/28/2014  Site/dose:   The patient initially received a dose of 50.4 Gy in 28 fractions to the breast using whole-breast tangent fields. This was delivered using a 3-D conformal technique. The patient then received a boost to the seroma. This delivered an additional 10 Gy in 5 fractions using a 3 field photon technique. The total dose was 60.4 Gy.  Narrative: The patient tolerated radiation treatment relatively well.   The patient had some expected skin irritation as she progressed during treatment. Moist desquamation was not present at the end of treatment.  Plan: The patient has completed radiation treatment. The patient will return to radiation oncology clinic for routine followup in one month. I advised the patient to call or return sooner if they have any questions or concerns related to their recovery or treatment. ________________________________  Jodelle Gross, M.D., Ph.D.

## 2014-09-22 ENCOUNTER — Other Ambulatory Visit: Payer: Self-pay | Admitting: *Deleted

## 2014-09-22 DIAGNOSIS — C50412 Malignant neoplasm of upper-outer quadrant of left female breast: Secondary | ICD-10-CM

## 2014-09-23 ENCOUNTER — Ambulatory Visit (HOSPITAL_BASED_OUTPATIENT_CLINIC_OR_DEPARTMENT_OTHER): Payer: Medicaid Other

## 2014-09-23 ENCOUNTER — Ambulatory Visit: Payer: Medicaid Other

## 2014-09-23 ENCOUNTER — Other Ambulatory Visit (HOSPITAL_BASED_OUTPATIENT_CLINIC_OR_DEPARTMENT_OTHER): Payer: Medicaid Other

## 2014-09-23 VITALS — BP 135/75 | HR 67 | Temp 98.0°F | Resp 18

## 2014-09-23 DIAGNOSIS — C50412 Malignant neoplasm of upper-outer quadrant of left female breast: Secondary | ICD-10-CM

## 2014-09-23 DIAGNOSIS — Z95828 Presence of other vascular implants and grafts: Secondary | ICD-10-CM

## 2014-09-23 DIAGNOSIS — Z5112 Encounter for antineoplastic immunotherapy: Secondary | ICD-10-CM

## 2014-09-23 LAB — COMPREHENSIVE METABOLIC PANEL (CC13)
ALBUMIN: 4 g/dL (ref 3.5–5.0)
ALK PHOS: 83 U/L (ref 40–150)
ALT: 21 U/L (ref 0–55)
AST: 18 U/L (ref 5–34)
Anion Gap: 8 mEq/L (ref 3–11)
BUN: 8.9 mg/dL (ref 7.0–26.0)
CO2: 26 mEq/L (ref 22–29)
CREATININE: 0.8 mg/dL (ref 0.6–1.1)
Calcium: 9.5 mg/dL (ref 8.4–10.4)
Chloride: 107 mEq/L (ref 98–109)
EGFR: 89 mL/min/{1.73_m2} — ABNORMAL LOW (ref >90)
Glucose: 97 mg/dl (ref 70–140)
POTASSIUM: 4.1 meq/L (ref 3.5–5.1)
SODIUM: 142 meq/L (ref 136–145)
Total Bilirubin: 0.63 mg/dL (ref 0.20–1.20)
Total Protein: 6.8 g/dL (ref 6.4–8.3)

## 2014-09-23 LAB — CBC WITH DIFFERENTIAL/PLATELET
BASO%: 0.4 % (ref 0.0–2.0)
BASOS ABS: 0 10*3/uL (ref 0.0–0.1)
EOS ABS: 0.3 10*3/uL (ref 0.0–0.5)
EOS%: 6.4 % (ref 0.0–7.0)
HCT: 38.3 % (ref 34.8–46.6)
HGB: 13.4 g/dL (ref 11.6–15.9)
LYMPH%: 17.2 % (ref 14.0–49.7)
MCH: 33 pg (ref 25.1–34.0)
MCHC: 35 g/dL (ref 31.5–36.0)
MCV: 94.3 fL (ref 79.5–101.0)
MONO#: 0.3 10*3/uL (ref 0.1–0.9)
MONO%: 5.4 % (ref 0.0–14.0)
NEUT%: 70.6 % (ref 38.4–76.8)
NEUTROS ABS: 3.4 10*3/uL (ref 1.5–6.5)
Platelets: 130 10*3/uL — ABNORMAL LOW (ref 145–400)
RBC: 4.06 10*6/uL (ref 3.70–5.45)
RDW: 14.9 % — ABNORMAL HIGH (ref 11.2–14.5)
WBC: 4.8 10*3/uL (ref 3.9–10.3)
lymph#: 0.8 10*3/uL — ABNORMAL LOW (ref 0.9–3.3)

## 2014-09-23 MED ORDER — HEPARIN SOD (PORK) LOCK FLUSH 100 UNIT/ML IV SOLN
500.0000 [IU] | Freq: Once | INTRAVENOUS | Status: AC | PRN
  Administered 2014-09-23: 500 [IU]
  Filled 2014-09-23: qty 5

## 2014-09-23 MED ORDER — ACETAMINOPHEN 325 MG PO TABS
650.0000 mg | ORAL_TABLET | Freq: Once | ORAL | Status: AC
  Administered 2014-09-23: 650 mg via ORAL

## 2014-09-23 MED ORDER — SODIUM CHLORIDE 0.9 % IV SOLN
Freq: Once | INTRAVENOUS | Status: AC
  Administered 2014-09-23: 12:00:00 via INTRAVENOUS

## 2014-09-23 MED ORDER — DIPHENHYDRAMINE HCL 25 MG PO CAPS
25.0000 mg | ORAL_CAPSULE | Freq: Once | ORAL | Status: AC
  Administered 2014-09-23: 25 mg via ORAL

## 2014-09-23 MED ORDER — DIPHENHYDRAMINE HCL 25 MG PO CAPS
ORAL_CAPSULE | ORAL | Status: AC
  Filled 2014-09-23: qty 1

## 2014-09-23 MED ORDER — SODIUM CHLORIDE 0.9 % IJ SOLN
10.0000 mL | INTRAMUSCULAR | Status: DC | PRN
  Administered 2014-09-23: 10 mL
  Filled 2014-09-23: qty 10

## 2014-09-23 MED ORDER — SODIUM CHLORIDE 0.9 % IJ SOLN
10.0000 mL | INTRAMUSCULAR | Status: DC | PRN
  Administered 2014-09-23: 10 mL via INTRAVENOUS
  Filled 2014-09-23: qty 10

## 2014-09-23 MED ORDER — ACETAMINOPHEN 325 MG PO TABS
ORAL_TABLET | ORAL | Status: AC
  Filled 2014-09-23: qty 2

## 2014-09-23 MED ORDER — TRASTUZUMAB CHEMO INJECTION 440 MG
6.0000 mg/kg | Freq: Once | INTRAVENOUS | Status: AC
  Administered 2014-09-23: 546 mg via INTRAVENOUS
  Filled 2014-09-23: qty 26

## 2014-09-23 NOTE — Patient Instructions (Signed)
[facility] Discharge Instructions for Patients Receiving Chemotherapy  Today you received the following chemotherapy agents: Herceptin   To help prevent nausea and vomiting after your treatment, we encourage you to take your nausea medication as directed.    If you develop nausea and vomiting that is not controlled by your nausea medication, call the clinic.   BELOW ARE SYMPTOMS THAT SHOULD BE REPORTED IMMEDIATELY:  *FEVER GREATER THAN 100.5 F  *CHILLS WITH OR WITHOUT FEVER  NAUSEA AND VOMITING THAT IS NOT CONTROLLED WITH YOUR NAUSEA MEDICATION  *UNUSUAL SHORTNESS OF BREATH  *UNUSUAL BRUISING OR BLEEDING  TENDERNESS IN MOUTH AND THROAT WITH OR WITHOUT PRESENCE OF ULCERS  *URINARY PROBLEMS  *BOWEL PROBLEMS  UNUSUAL RASH Items with * indicate a potential emergency and should be followed up as soon as possible.  Feel free to call the clinic you have any questions or concerns. The clinic phone number is [phone].  Please show the CHEMO ALERT CARD at check-in to the Emergency Department and triage nurse.   

## 2014-09-23 NOTE — Patient Instructions (Signed)

## 2014-10-06 ENCOUNTER — Ambulatory Visit (HOSPITAL_COMMUNITY)
Admission: RE | Admit: 2014-10-06 | Discharge: 2014-10-06 | Disposition: A | Discharge status: Home / Self Care | Payer: Medicaid Other | Source: Ambulatory Visit | Attending: Oncology | Admitting: Oncology

## 2014-10-06 DIAGNOSIS — J449 Chronic obstructive pulmonary disease, unspecified: Secondary | ICD-10-CM | POA: Diagnosis not present

## 2014-10-06 DIAGNOSIS — C50412 Malignant neoplasm of upper-outer quadrant of left female breast: Secondary | ICD-10-CM | POA: Insufficient documentation

## 2014-10-06 DIAGNOSIS — K760 Fatty (change of) liver, not elsewhere classified: Secondary | ICD-10-CM

## 2014-10-06 NOTE — Progress Notes (Signed)
  Echocardiogram 2D Echocardiogram has been performed.  Diamond Nickel 10/06/2014, 12:27 PM

## 2014-10-09 ENCOUNTER — Ambulatory Visit: Payer: Medicaid Other | Admitting: Radiation Oncology

## 2014-10-10 ENCOUNTER — Telehealth: Payer: Self-pay | Admitting: *Deleted

## 2014-10-10 ENCOUNTER — Encounter: Payer: Self-pay | Admitting: Radiation Oncology

## 2014-10-10 ENCOUNTER — Ambulatory Visit
Admission: RE | Admit: 2014-10-10 | Discharge: 2014-10-10 | Disposition: A | Discharge status: Home / Self Care | Payer: Medicaid Other | Source: Ambulatory Visit | Attending: Radiation Oncology | Admitting: Radiation Oncology

## 2014-10-10 VITALS — BP 124/82 | HR 85 | Temp 98.6°F | Ht 65.0 in | Wt 198.1 lb

## 2014-10-10 DIAGNOSIS — C50412 Malignant neoplasm of upper-outer quadrant of left female breast: Secondary | ICD-10-CM | POA: Diagnosis present

## 2014-10-10 MED ORDER — RADIAPLEXRX EX GEL
Freq: Once | CUTANEOUS | Status: AC
  Administered 2014-10-10: 15:00:00 via TOPICAL

## 2014-10-10 NOTE — Progress Notes (Signed)
Radiation Oncology         [phone] ________________________________  Name: Jessica [name] MRN: [id number]  Date: 10/10/2014  DOB: Mar 02, 1966  Follow-Up Visit Note  CC: Chevis Pretty, MD  [name], MD  Diagnosis:   Left-sided breast cancer  Interval Since Last Radiation:  Approximately one month   Narrative:  The patient returns today for routine follow-up.  She has done well overall since she finished treatment. [name] skin has healed significantly since she completed her course of radiation treatment. She has begun anti-hormonal treatment.                              ALLERGIES:  has No Known Allergies.  Meds: Current Outpatient Prescriptions  Medication Sig Dispense Refill  . acetaminophen (TYLENOL) 325 MG tablet Take 650 mg by mouth every 6 (six) hours as needed.    . budesonide-formoterol (SYMBICORT) 160-4.5 MCG/ACT inhaler Inhale 1 puff into the lungs 2 (two) times daily as needed (for shortness of breath).     . calcium carbonate (TUMS - DOSED IN MG ELEMENTAL CALCIUM) 500 MG chewable tablet Chew 1 tablet by mouth daily.    . cyclobenzaprine (FLEXERIL) 5 MG tablet TAKE 1 TABLET BY MOUTH TWICE DAILY AS NEEDED FOR MUSCLE SPASMS 60 tablet 0  . eszopiclone (LUNESTA) 1 MG TABS tablet TAKE 1 TABLET BY MOUTH AT BEDTIME AS NEEDED FOR SLEEP. TAKE IMMEDIATELY BEFORE BEDTIME 30 tablet 0  . gabapentin (NEURONTIN) 300 MG capsule Take 1 capsule (300 mg total) by mouth 3 (three) times daily. 90 capsule 3  . hyaluronate sodium (RADIAPLEXRX) GEL Apply 1 application topically 2 (two) times daily.    Marland Kitchen ipratropium (ATROVENT HFA) 17 MCG/ACT inhaler Inhale 1 puff into the lungs every 6 (six) hours as needed for wheezing (SOB).     Marland Kitchen loratadine (CLARITIN) 10 MG tablet Take 1 tablet (10 mg total) by mouth as needed. 90 tablet 1  . omeprazole (PRILOSEC) 40 MG capsule TAKE 1 CAPSULE BY MOUTH ONCE DAILY 30 capsule 2  . oxyCODONE-acetaminophen (PERCOCET/ROXICET) 5-325 MG per tablet  Take 1 tablet by mouth every 4 (four) hours as needed for severe pain. 40 tablet 0  . tamoxifen (NOLVADEX) 20 MG tablet Take 20 mg by mouth daily.    Marland Kitchen zolpidem (AMBIEN) 5 MG tablet Take 1 tablet (5 mg total) by mouth at bedtime as needed for sleep. 90 tablet 3   No current facility-administered medications for this encounter.    Physical Findings: The patient is in no acute distress. Patient is alert and oriented.  height is 5\' 5"  (1.651 m) and weight is 198 lb 1.6 oz (89.858 kg). Her temperature is 98.6 F (37 C). Her blood pressure is 124/82 and her pulse is 85. .   The skin in the treatment area has healed satisfactorily, no areas of concern/moist desquamation/poor healing  Lab Findings: Lab Results  Component Value Date   WBC 4.8 09/23/2014   HGB 13.4 09/23/2014   HCT 38.3 09/23/2014   MCV 94.3 09/23/2014   PLT 130* 09/23/2014     Radiographic Findings: No results found.  Impression:    The patient has done satisfactorily since finishing treatment. She has begun anti-hormonal treatment.  Plan:  The patient will followup in our clinic on a when necessary basis.   Jodelle Gross, M.D., Ph.D.

## 2014-10-10 NOTE — Telephone Encounter (Signed)
Called  And left voice message to have patient call, missed her 230pm appt, please call to reschedule 3:04 PM

## 2014-10-10 NOTE — Progress Notes (Signed)
[name] is here for reassessment s/p xrt to her left breast.  Reports occassional shooting pains in her breast and decrease in radiation related fatigue.  Tamoxifen since 09/29/14.Note hyperpigmenation in the upper portion of her left breast with intact and soft skin.  Given Radiaplex Gel upon her request.

## 2014-10-13 ENCOUNTER — Other Ambulatory Visit: Payer: Self-pay | Admitting: *Deleted

## 2014-10-13 DIAGNOSIS — C50412 Malignant neoplasm of upper-outer quadrant of left female breast: Secondary | ICD-10-CM

## 2014-10-14 ENCOUNTER — Other Ambulatory Visit (HOSPITAL_BASED_OUTPATIENT_CLINIC_OR_DEPARTMENT_OTHER): Payer: Medicaid Other

## 2014-10-14 ENCOUNTER — Ambulatory Visit: Payer: Medicaid Other

## 2014-10-14 ENCOUNTER — Ambulatory Visit (HOSPITAL_BASED_OUTPATIENT_CLINIC_OR_DEPARTMENT_OTHER): Payer: Medicaid Other

## 2014-10-14 VITALS — BP 133/81 | HR 71 | Temp 98.2°F | Resp 18

## 2014-10-14 DIAGNOSIS — Z5112 Encounter for antineoplastic immunotherapy: Secondary | ICD-10-CM

## 2014-10-14 DIAGNOSIS — C50412 Malignant neoplasm of upper-outer quadrant of left female breast: Secondary | ICD-10-CM | POA: Diagnosis not present

## 2014-10-14 DIAGNOSIS — Z95828 Presence of other vascular implants and grafts: Secondary | ICD-10-CM

## 2014-10-14 LAB — CBC WITH DIFFERENTIAL/PLATELET
BASO%: 0.3 % (ref 0.0–2.0)
Basophils Absolute: 0 10*3/uL (ref 0.0–0.1)
EOS ABS: 0.4 10*3/uL (ref 0.0–0.5)
EOS%: 6.9 % (ref 0.0–7.0)
HCT: 37.8 % (ref 34.8–46.6)
HGB: 13.1 g/dL (ref 11.6–15.9)
LYMPH%: 15.2 % (ref 14.0–49.7)
MCH: 33 pg (ref 25.1–34.0)
MCHC: 34.7 g/dL (ref 31.5–36.0)
MCV: 95.2 fL (ref 79.5–101.0)
MONO#: 0.5 10*3/uL (ref 0.1–0.9)
MONO%: 8.7 % (ref 0.0–14.0)
NEUT#: 4.3 10*3/uL (ref 1.5–6.5)
NEUT%: 68.9 % (ref 38.4–76.8)
Platelets: 125 10*3/uL — ABNORMAL LOW (ref 145–400)
RBC: 3.97 10*6/uL (ref 3.70–5.45)
RDW: 14.3 % (ref 11.2–14.5)
WBC: 6.2 10*3/uL (ref 3.9–10.3)
lymph#: 0.9 10*3/uL (ref 0.9–3.3)

## 2014-10-14 LAB — COMPREHENSIVE METABOLIC PANEL (CC13)
ALT: 13 U/L (ref 0–55)
ANION GAP: 6 meq/L (ref 3–11)
AST: 12 U/L (ref 5–34)
Albumin: 3.9 g/dL (ref 3.5–5.0)
Alkaline Phosphatase: 99 U/L (ref 40–150)
BUN: 12.1 mg/dL (ref 7.0–26.0)
CALCIUM: 9.4 mg/dL (ref 8.4–10.4)
CHLORIDE: 109 meq/L (ref 98–109)
CO2: 26 meq/L (ref 22–29)
CREATININE: 0.9 mg/dL (ref 0.6–1.1)
EGFR: 73 mL/min/{1.73_m2} — ABNORMAL LOW (ref >90)
Glucose: 80 mg/dl (ref 70–140)
Potassium: 4 mEq/L (ref 3.5–5.1)
SODIUM: 141 meq/L (ref 136–145)
Total Bilirubin: 0.35 mg/dL (ref 0.20–1.20)
Total Protein: 6.5 g/dL (ref 6.4–8.3)

## 2014-10-14 MED ORDER — TRASTUZUMAB CHEMO INJECTION 440 MG
6.0000 mg/kg | Freq: Once | INTRAVENOUS | Status: AC
  Administered 2014-10-14: 546 mg via INTRAVENOUS
  Filled 2014-10-14: qty 26

## 2014-10-14 MED ORDER — HEPARIN SOD (PORK) LOCK FLUSH 100 UNIT/ML IV SOLN
500.0000 [IU] | Freq: Once | INTRAVENOUS | Status: AC | PRN
  Administered 2014-10-14: 500 [IU]
  Filled 2014-10-14: qty 5

## 2014-10-14 MED ORDER — ACETAMINOPHEN 325 MG PO TABS
650.0000 mg | ORAL_TABLET | Freq: Once | ORAL | Status: AC
  Administered 2014-10-14: 650 mg via ORAL

## 2014-10-14 MED ORDER — ACETAMINOPHEN 325 MG PO TABS
ORAL_TABLET | ORAL | Status: AC
  Filled 2014-10-14: qty 2

## 2014-10-14 MED ORDER — DIPHENHYDRAMINE HCL 25 MG PO CAPS
25.0000 mg | ORAL_CAPSULE | Freq: Once | ORAL | Status: AC
  Administered 2014-10-14: 25 mg via ORAL

## 2014-10-14 MED ORDER — SODIUM CHLORIDE 0.9 % IV SOLN
Freq: Once | INTRAVENOUS | Status: AC
  Administered 2014-10-14: 12:00:00 via INTRAVENOUS

## 2014-10-14 MED ORDER — SODIUM CHLORIDE 0.9 % IJ SOLN
10.0000 mL | INTRAMUSCULAR | Status: DC | PRN
  Administered 2014-10-14: 10 mL
  Filled 2014-10-14: qty 10

## 2014-10-14 MED ORDER — SODIUM CHLORIDE 0.9 % IJ SOLN
10.0000 mL | INTRAMUSCULAR | Status: DC | PRN
  Administered 2014-10-14: 10 mL via INTRAVENOUS
  Filled 2014-10-14: qty 10

## 2014-10-14 MED ORDER — DIPHENHYDRAMINE HCL 25 MG PO CAPS
ORAL_CAPSULE | ORAL | Status: AC
  Filled 2014-10-14: qty 1

## 2014-10-14 NOTE — Patient Instructions (Signed)
[facility] Discharge Instructions for Patients  Today you received the following: Herceptin   To help prevent nausea and vomiting after your treatment, we encourage you to take your nausea medication as directed.   If you develop nausea and vomiting that is not controlled by your nausea medication, call the clinic.   BELOW ARE SYMPTOMS THAT SHOULD BE REPORTED IMMEDIATELY:  *FEVER GREATER THAN 100.5 F  *CHILLS WITH OR WITHOUT FEVER  NAUSEA AND VOMITING THAT IS NOT CONTROLLED WITH YOUR NAUSEA MEDICATION  *UNUSUAL SHORTNESS OF BREATH  *UNUSUAL BRUISING OR BLEEDING  TENDERNESS IN MOUTH AND THROAT WITH OR WITHOUT PRESENCE OF ULCERS  *URINARY PROBLEMS  *BOWEL PROBLEMS  UNUSUAL RASH Items with * indicate a potential emergency and should be followed up as soon as possible.  Feel free to call the clinic you have any questions or concerns. The clinic phone number is [phone].  Please show the CHEMO ALERT CARD at check-in to the Emergency Department and triage nurse.   

## 2014-10-14 NOTE — Patient Instructions (Signed)

## 2014-10-21 ENCOUNTER — Encounter: Payer: Self-pay | Admitting: Physician Assistant

## 2014-10-21 ENCOUNTER — Ambulatory Visit (INDEPENDENT_AMBULATORY_CARE_PROVIDER_SITE_OTHER): Payer: Medicaid Other | Admitting: Physician Assistant

## 2014-10-21 VITALS — BP 116/78 | HR 68 | Ht 65.0 in | Wt 204.7 lb

## 2014-10-21 DIAGNOSIS — Z853 Personal history of malignant neoplasm of breast: Secondary | ICD-10-CM | POA: Diagnosis not present

## 2014-10-21 DIAGNOSIS — R1032 Left lower quadrant pain: Secondary | ICD-10-CM | POA: Diagnosis not present

## 2014-10-21 MED ORDER — NA SULFATE-K SULFATE-MG SULF 17.5-3.13-1.6 GM/177ML PO SOLN: 1.0000 | Freq: Once | ORAL | Status: DC

## 2014-10-21 NOTE — Patient Instructions (Addendum)
You have been scheduled for a colonoscopy. Please follow written instructions given to you at your visit today.  Please pick up your prep supplies at the pharmacy within the next 1-3 days. [name]. If you use inhalers (even only as needed), please bring them with you on the day of your procedure. Your physician has requested that you go to www.startemmi.com and enter the access code given to you at your visit today. This web site gives a general overview about your procedure. However, you should still follow specific instructions given to you by our office regarding your preparation for the procedure.

## 2014-10-21 NOTE — Progress Notes (Signed)
i agree with the above note, plan 

## 2014-10-21 NOTE — Progress Notes (Signed)
[name] ID: Jessica [name], female   DOB: 03-07-1966, 49 y.o.   MRN: [id number]   Subjective:    [name] ID: Jessica [name], female    DOB: 10-04-1965, 49 y.o.   MRN: [id number]  HPI Jessica [name] is a pleasant 49 year old white female, new to GI today referred by Dr. Donne [name] for evaluation of left lower quadrant pain. [name] has history of breast cancer which was diagnosed in August 2015. She is being followed by Dr. Randell [name] not and has undergone left mastectomy and radiation therapy and is now completing chemotherapy with Herceptin. She says she will complete her course in October 2016. She had onset of left lower quadrant pain in November 2015 and says that this pain has been constant since. The pain is nonradiating and described as knifelike in nature and initially somewhat worse with straining for bowel movements etc. He says she has had gradual worsening of the discomfort. She is status post hysterectomy but has her ovaries intact and says this seems to be somewhat worse cyclically around when it would be time for her ventral cycle. He is not had any significant change in her bowel habits currently alternating between looser stools and constipation and no melena or hematochezia. CT of the abdomen and pelvis was done in May 2016 showed mild fatty changes of her liver and otherwise negative exam. Evaluation per Dr. Donne [name] with no evidence for hernia etc. 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  Review of Systems Pertinent positive and negative review of systems were noted in the above HPI section.  All other review of systems was otherwise negative.  Outpatient Encounter Prescriptions as of 10/21/2014  Medication Sig  . acetaminophen (TYLENOL) 325 MG tablet Take 650 mg by mouth every 6 (six) hours as needed.  . budesonide-formoterol (SYMBICORT) 160-4.5 MCG/ACT inhaler Inhale 1 puff into the lungs 2 (two) times daily as needed (for  shortness of breath).   . calcium carbonate (TUMS - DOSED IN MG ELEMENTAL CALCIUM) 500 MG chewable tablet Chew 1 tablet by mouth daily.  . cyclobenzaprine (FLEXERIL) 5 MG tablet TAKE 1 TABLET BY MOUTH TWICE DAILY AS NEEDED FOR MUSCLE SPASMS  . eszopiclone (LUNESTA) 1 MG TABS tablet TAKE 1 TABLET BY MOUTH AT BEDTIME AS NEEDED FOR SLEEP. TAKE IMMEDIATELY BEFORE BEDTIME  . gabapentin (NEURONTIN) 300 MG capsule Take 1 capsule (300 mg total) by mouth 3 (three) times daily.  . hyaluronate sodium (RADIAPLEXRX) GEL Apply 1 application topically 2 (two) times daily.  Marland Kitchen ipratropium (ATROVENT HFA) 17 MCG/ACT inhaler Inhale 1 puff into the lungs every 6 (six) hours as needed for wheezing (SOB).   Marland Kitchen loratadine (CLARITIN) 10 MG tablet Take 1 tablet (10 mg total) by mouth as needed.  Marland Kitchen omeprazole (PRILOSEC) 40 MG capsule TAKE 1 CAPSULE BY MOUTH ONCE DAILY  . oxyCODONE-acetaminophen (PERCOCET/ROXICET) 5-325 MG per tablet Take 1 tablet by mouth every 4 (four) hours as needed for severe pain.  . tamoxifen (NOLVADEX) 20 MG tablet Take 20 mg by mouth daily.  Marland Kitchen zolpidem (AMBIEN) 5 MG tablet Take 1 tablet (5 mg total) by mouth at bedtime as needed for sleep.  . Na Sulfate-K Sulfate-Mg Sulf SOLN Take 1 kit by mouth once.   No facility-administered encounter medications on file as of 10/21/2014.   No Known Allergies [name] Active Problem List   Diagnosis Date Noted  . Hepatic steatosis 09/02/2014  . 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 Enchondroma of bone  07/02/2014  . Lytic bone lesion of left femur   . Low back pain 03/25/2014  . Constipation 03/25/2014  . Thrush 03/04/2014  . Spasm of back muscles 03/04/2014  . Insomnia 02/13/2014  . UTI (urinary tract infection) 01/27/2014  . Cerumen impaction 01/27/2014  . Diarrhea 01/27/2014  . Sinusitis 01/20/2014  . Acute sinusitis 01/20/2014  . Rash 01/20/2014  . Excessive cerumen in both ear canals 01/20/2014  . Oral mucositis due to antineoplastic  therapy 01/20/2014  . Breast cancer of upper-outer quadrant of left female breast 12/03/2013  . Postoperative infection 06/04/2013  . HYPERTRIGLYCERIDEMIA 10/07/2009  . MENORRHAGIA 10/07/2009  . TOBACCO ABUSE 06/22/2009  . OVARIAN CYST 06/22/2009  . ELEVATED BLOOD PRESSURE WITHOUT DIAGNOSIS OF HYPERTENSION 06/22/2009  . CHRONIC OBSTRUCTIVE PULMONARY DISEASE 06/11/2009   History   Social History  . Marital Status: Single    Spouse Name: N/A  . Number of Children: N/A  . Years of Education: N/A   Occupational History  . Not on file.   Social History Main Topics  . Smoking status: Current Every Day Smoker -- 1.00 packs/day for 16 years    Types: Cigarettes  . Smokeless tobacco: Never Used     Comment: 1/2ppd decreased  . Alcohol Use: 0.0 oz/week    0 Standard drinks or equivalent per week     Comment: occasional   . Drug Use: No  . Sexual Activity: Not Currently    Birth Control/ Protection: Surgical   Other Topics Concern  . Not on file   Social History Narrative    Jessica [name]'s family history includes COPD in her father; Heart disease in her father and mother; Hypercholesterolemia in her father and mother. There is no history of Cancer.      Objective:    Filed Vitals:   10/21/14 1320  BP: 116/78  Pulse: 68    Physical Exam  well-developed white female in no acute distress, pleasant accompanied by her husband blood pressure 116/78 pulse 68 height 5 foot 5 weight 204, BMI 34. HEENT: nontraumatic normocephalic EOMI PERRLA sclera anicteric, Supple: no JVD, Cardiovascular: regular rate and rhythm with S1-S2 no murmur or gallop, Pulmonary: clear bilaterally, Abdomen: soft she is tender in the left lower quadrant there is no guarding or rebound no palpable mass or hepatosplenomegaly, no palpable hernia, sounds are present, Rectal :exam not done, Extremities: no clubbing cyanosis or edema skin warm and dry, Psych :mood and affect normal and appropriate       Assessment  & Plan:   #1 49 yo female with several month hx of persistent LLQ abdominal pain. Negative CT abd/pelvis Etiology not clear , may be musculoskeletal , but will r/o colonic pathology #2 Hx breast cancer left- s/p mastectomy/radiation , and completing chemotherapy with herceptin currently #3 COPD #4 HTN  #5 s/p hysterectomy-ovaries in situ  Plan; Will schedule for Colonoscopy with Dr. Ardis Hughs- procedure discussed in detail with pt and she is agreeable to proceed. If Colon negative consider further imaging of lumbar spine to r/o radicular pain   Amy S Esterwood PA-C 10/21/2014   Cc: Chevis Pretty, MD

## 2014-10-27 ENCOUNTER — Encounter: Payer: Self-pay | Admitting: Gastroenterology

## 2014-10-27 ENCOUNTER — Telehealth: Payer: Self-pay

## 2014-10-27 ENCOUNTER — Ambulatory Visit (AMBULATORY_SURGERY_CENTER): Payer: Medicaid Other | Admitting: Gastroenterology

## 2014-10-27 VITALS — BP 145/79 | HR 76 | Temp 97.5°F | Resp 21 | Ht 65.0 in | Wt 204.0 lb

## 2014-10-27 DIAGNOSIS — R1032 Left lower quadrant pain: Secondary | ICD-10-CM

## 2014-10-27 MED ORDER — SODIUM CHLORIDE 0.9 % IV SOLN: 500.0000 mL | INTRAVENOUS | Status: DC

## 2014-10-27 NOTE — Patient Instructions (Addendum)
One of your biggest health concerns is your smoking.  This increases your risk for most cancers and serious cardiovascular diseases such as strokes, heart attacks.  You should try your best to stop.  If you need assistance, please contact your PCP or Smoking Cessation Class at [facility] [phone]) or [place] (1-800-QUIT-NOW).  YOU HAD AN ENDOSCOPIC PROCEDURE TODAY AT [facility]:   Refer to the procedure report that was given to you for any specific questions about what was found during the examination.  If the procedure report does not answer your questions, please call your gastroenterologist to clarify.  If you requested that your care partner not be given the details of your procedure findings, then the procedure report has been included in a sealed envelope for you to review at your convenience later.  YOU SHOULD EXPECT: Some feelings of bloating in the abdomen. Passage of more gas than usual.  Walking can help get rid of the air that was put into your GI tract during the procedure and reduce the bloating. If you had a lower endoscopy (such as a colonoscopy or flexible sigmoidoscopy) you may notice spotting of blood in your stool or on the toilet paper. If you underwent a bowel prep for your procedure, you may not have a normal bowel movement for a few days.  Please Note:  You might notice some irritation and congestion in your nose or some drainage.  This is from the oxygen used during your procedure.  There is no need for concern and it should clear up in a day or so.  SYMPTOMS TO REPORT IMMEDIATELY:   Following lower endoscopy (colonoscopy or flexible sigmoidoscopy):  Excessive amounts of blood in the stool  Significant tenderness or worsening of abdominal pains  Swelling of the abdomen that is new, acute  Fever of 100F or higher   For urgent or emergent issues, a gastroenterologist can be reached at any hour by calling [phone].   DIET: Your  first meal following the procedure should be a small meal and then it is ok to progress to your normal diet. Heavy or fried foods are harder to digest and may make you feel nauseous or bloated.  Likewise, meals heavy in dairy and vegetables can increase bloating.  Drink plenty of fluids but you should avoid alcoholic beverages for 24 hours.  ACTIVITY:  You should plan to take it easy for the rest of today and you should NOT DRIVE or use heavy machinery until tomorrow (because of the sedation medicines used during the test).    FOLLOW UP: Our staff will call the number listed on your records the next business day following your procedure to check on you and address any questions or concerns that you may have regarding the information given to you following your procedure. If we do not reach you, we will leave a message.  However, if you are feeling well and you are not experiencing any problems, there is no need to return our call.  We will assume that you have returned to your regular daily activities without incident.  If any biopsies were taken you will be contacted by phone or by letter within the next 1-3 weeks.  Please call us at [phone] if you have not heard about the biopsies in 3 weeks.    SIGNATURES/CONFIDENTIALITY: You and/or your care partner have signed paperwork which will be entered into your electronic medical record.  These signatures attest to the fact that  that the information above on your After Visit Summary has been reviewed and is understood.  Full responsibility of the confidentiality of this discharge information lies with you and/or your care-partner.     Normal colon exam today, repeat colonoscopy in 10 years. Our office will make you an appointment for MRI. We will call you with that information.

## 2014-10-27 NOTE — Telephone Encounter (Signed)
Dr. Ardis Hughs' office will arrange MRI of lumbar spine (for cause of LLQ pains)

## 2014-10-27 NOTE — Progress Notes (Signed)
Report to PACU, RN, vss, BBS= Clear.  

## 2014-10-27 NOTE — Op Note (Signed)
[facility]  [address] [place], [id number]   COLONOSCOPY PROCEDURE REPORT  PATIENT: Jessica [name], Jessica [name]  MR#: [id number] BIRTHDATE: 1965/05/18 , 71  yrs. old GENDER: female ENDOSCOPIST: Milus Banister, MD REFERRED IO:[id number] Donne Hazel, M.D. PROCEDURE DATE:  10/27/2014 PROCEDURE:   Colonoscopy, diagnostic First Screening Colonoscopy - Avg.  risk and is 50 yrs.  old or older - No.  Prior Negative Screening - Now for repeat screening. N/A  History of Adenoma - Now for follow-up colonoscopy & has been > or = to 3 yrs.  N/A  Recommend repeat exam, <10 yrs? No ASA CLASS:   Class II INDICATIONS:LLQ pains; normal abd/pelvic CT. MEDICATIONS: Monitored anesthesia care, Propofol 300 mg IV, and lidocaine 150mg  IV  DESCRIPTION OF PROCEDURE:   After the risks benefits and alternatives of the procedure were thoroughly explained, informed consent was obtained.  The digital rectal exam revealed no abnormalities of the rectum.   The [id number]  endoscope was introduced through the anus and advanced to the cecum, which was identified by both the appendix and ileocecal valve. No adverse events experienced.   The quality of the prep was excellent.  The instrument was then slowly withdrawn as the colon was fully examined. Estimated blood loss is zero unless otherwise noted in this procedure report.  COLON FINDINGS: A normal appearing cecum, ileocecal valve, and appendiceal orifice were identified.  The ascending, transverse, descending, sigmoid colon, and rectum appeared unremarkable. Retroflexed views revealed no abnormalities. The time to cecum = 1.8 Withdrawal time = 6.4   The scope was withdrawn and the procedure completed. COMPLICATIONS: There were no immediate complications.  ENDOSCOPIC IMPRESSION: Normal colonoscopy No polyps or cancers  RECOMMENDATIONS: You should continue to follow colorectal cancer screening guidelines for "routine risk" patients with a  repeat colonoscopy in 10 years.  Dr. Ardis Hughs' office will arrange MRI of lumbar spine (for cause of LLQ pains)  eSigned:  Milus Banister, MD 10/27/2014 10:43 AM

## 2014-10-28 ENCOUNTER — Telehealth: Payer: Self-pay

## 2014-10-28 NOTE — Telephone Encounter (Signed)
  Follow up Call-  Call back number 10/27/2014  Post procedure Call Back phone  # [phone]  Permission to leave phone message Yes     Patient questions:  Do you have a fever, pain , or abdominal swelling? No. Pain Score  0 *  Have you tolerated food without any problems? Yes.    Have you been able to return to your normal activities? Yes.    Do you have any questions about your discharge instructions: Diet   No. Medications  No. Follow up visit  No.  Do you have questions or concerns about your Care? No.  Actions: * If pain score is 4 or above: No action needed, pain <4.

## 2014-10-30 NOTE — Telephone Encounter (Signed)
The patient has been notified of this information and all questions answered.

## 2014-10-30 NOTE — Telephone Encounter (Signed)
You have been scheduled for an MRI at [facility] on 11/05/14. Your appointment time is 7 pm. Please arrive 15 minutes prior to your appointment time for registration purposes.   In addition, if you have any metal in your body, have a pacemaker or defibrillator, please be sure to let your ordering physician know. This test typically takes 45 minutes to 1 hour to complete.  Left message on machine to call back

## 2014-11-03 ENCOUNTER — Other Ambulatory Visit: Payer: Self-pay | Admitting: *Deleted

## 2014-11-03 DIAGNOSIS — C50412 Malignant neoplasm of upper-outer quadrant of left female breast: Secondary | ICD-10-CM

## 2014-11-04 ENCOUNTER — Telehealth: Payer: Self-pay

## 2014-11-04 ENCOUNTER — Other Ambulatory Visit (HOSPITAL_BASED_OUTPATIENT_CLINIC_OR_DEPARTMENT_OTHER): Payer: Medicaid Other

## 2014-11-04 ENCOUNTER — Ambulatory Visit (HOSPITAL_BASED_OUTPATIENT_CLINIC_OR_DEPARTMENT_OTHER): Payer: Medicaid Other

## 2014-11-04 ENCOUNTER — Other Ambulatory Visit: Payer: Self-pay | Admitting: Oncology

## 2014-11-04 ENCOUNTER — Ambulatory Visit: Payer: Medicaid Other

## 2014-11-04 VITALS — BP 113/64 | HR 70 | Temp 98.7°F | Resp 20

## 2014-11-04 DIAGNOSIS — Z5111 Encounter for antineoplastic chemotherapy: Secondary | ICD-10-CM | POA: Diagnosis not present

## 2014-11-04 DIAGNOSIS — C50412 Malignant neoplasm of upper-outer quadrant of left female breast: Secondary | ICD-10-CM

## 2014-11-04 DIAGNOSIS — Z95828 Presence of other vascular implants and grafts: Secondary | ICD-10-CM

## 2014-11-04 LAB — CBC WITH DIFFERENTIAL/PLATELET
BASO%: 0.6 % (ref 0.0–2.0)
Basophils Absolute: 0 10*3/uL (ref 0.0–0.1)
EOS ABS: 0.4 10*3/uL (ref 0.0–0.5)
EOS%: 7.1 % — AB (ref 0.0–7.0)
HCT: 37.4 % (ref 34.8–46.6)
HGB: 12.9 g/dL (ref 11.6–15.9)
LYMPH%: 17 % (ref 14.0–49.7)
MCH: 33 pg (ref 25.1–34.0)
MCHC: 34.6 g/dL (ref 31.5–36.0)
MCV: 95.3 fL (ref 79.5–101.0)
MONO#: 0.3 10*3/uL (ref 0.1–0.9)
MONO%: 5.7 % (ref 0.0–14.0)
NEUT#: 3.6 10*3/uL (ref 1.5–6.5)
NEUT%: 69.6 % (ref 38.4–76.8)
PLATELETS: 131 10*3/uL — AB (ref 145–400)
RBC: 3.92 10*6/uL (ref 3.70–5.45)
RDW: 13.2 % (ref 11.2–14.5)
WBC: 5.2 10*3/uL (ref 3.9–10.3)
lymph#: 0.9 10*3/uL (ref 0.9–3.3)

## 2014-11-04 LAB — COMPREHENSIVE METABOLIC PANEL (CC13)
ALT: 13 U/L (ref 0–55)
AST: 13 U/L (ref 5–34)
Albumin: 3.9 g/dL (ref 3.5–5.0)
Alkaline Phosphatase: 78 U/L (ref 40–150)
Anion Gap: 9 mEq/L (ref 3–11)
BUN: 11.6 mg/dL (ref 7.0–26.0)
CALCIUM: 9.4 mg/dL (ref 8.4–10.4)
CHLORIDE: 110 meq/L — AB (ref 98–109)
CO2: 24 meq/L (ref 22–29)
CREATININE: 0.9 mg/dL (ref 0.6–1.1)
EGFR: 78 mL/min/{1.73_m2} — AB (ref >90)
Glucose: 87 mg/dl (ref 70–140)
Potassium: 4 mEq/L (ref 3.5–5.1)
Sodium: 142 mEq/L (ref 136–145)
Total Bilirubin: 0.34 mg/dL (ref 0.20–1.20)
Total Protein: 6.5 g/dL (ref 6.4–8.3)

## 2014-11-04 MED ORDER — TRASTUZUMAB CHEMO INJECTION 440 MG
6.0000 mg/kg | Freq: Once | INTRAVENOUS | Status: AC
  Administered 2014-11-04: 546 mg via INTRAVENOUS
  Filled 2014-11-04: qty 26

## 2014-11-04 MED ORDER — SODIUM CHLORIDE 0.9 % IJ SOLN
10.0000 mL | INTRAMUSCULAR | Status: DC | PRN
  Administered 2014-11-04: 10 mL via INTRAVENOUS
  Filled 2014-11-04: qty 10

## 2014-11-04 MED ORDER — SODIUM CHLORIDE 0.9 % IJ SOLN
10.0000 mL | INTRAMUSCULAR | Status: DC | PRN
  Administered 2014-11-04: 10 mL
  Filled 2014-11-04: qty 10

## 2014-11-04 MED ORDER — ACETAMINOPHEN 325 MG PO TABS
ORAL_TABLET | ORAL | Status: AC
  Filled 2014-11-04: qty 2

## 2014-11-04 MED ORDER — DIPHENHYDRAMINE HCL 25 MG PO CAPS
ORAL_CAPSULE | ORAL | Status: AC
  Filled 2014-11-04: qty 1

## 2014-11-04 MED ORDER — SODIUM CHLORIDE 0.9 % IV SOLN
Freq: Once | INTRAVENOUS | Status: AC
  Administered 2014-11-04: 12:00:00 via INTRAVENOUS

## 2014-11-04 MED ORDER — DIPHENHYDRAMINE HCL 25 MG PO CAPS
25.0000 mg | ORAL_CAPSULE | Freq: Once | ORAL | Status: AC
  Administered 2014-11-04: 25 mg via ORAL

## 2014-11-04 MED ORDER — HEPARIN SOD (PORK) LOCK FLUSH 100 UNIT/ML IV SOLN
500.0000 [IU] | Freq: Once | INTRAVENOUS | Status: AC | PRN
  Administered 2014-11-04: 500 [IU]
  Filled 2014-11-04: qty 5

## 2014-11-04 MED ORDER — ACETAMINOPHEN 325 MG PO TABS
650.0000 mg | ORAL_TABLET | Freq: Once | ORAL | Status: AC
  Administered 2014-11-04: 650 mg via ORAL

## 2014-11-04 NOTE — Patient Instructions (Signed)
[facility] Discharge Instructions for Patients Receiving Chemotherapy  Today you received the following chemotherapy agents:  Herceptin  To help prevent nausea and vomiting after your treatment, we encourage you to take your nausea medication as ordered per MD.   If you develop nausea and vomiting that is not controlled by your nausea medication, call the clinic.   BELOW ARE SYMPTOMS THAT SHOULD BE REPORTED IMMEDIATELY:  *FEVER GREATER THAN 100.5 F  *CHILLS WITH OR WITHOUT FEVER  NAUSEA AND VOMITING THAT IS NOT CONTROLLED WITH YOUR NAUSEA MEDICATION  *UNUSUAL SHORTNESS OF BREATH  *UNUSUAL BRUISING OR BLEEDING  TENDERNESS IN MOUTH AND THROAT WITH OR WITHOUT PRESENCE OF ULCERS  *URINARY PROBLEMS  *BOWEL PROBLEMS  UNUSUAL RASH Items with * indicate a potential emergency and should be followed up as soon as possible.  Feel free to call the clinic you have any questions or concerns. The clinic phone number is [phone].  Please show the CHEMO ALERT CARD at check-in to the Emergency Department and triage nurse.   

## 2014-11-04 NOTE — Telephone Encounter (Signed)
-----   Message from Darden Dates sent at 11/04/2014 12:26 PM EDT ----- MRI SPINE DENIED BY MEDICAID.  THEY WILL BE FAXING DENIAL.  PT IS SCHEDULED FOR TOMORROW.

## 2014-11-04 NOTE — Telephone Encounter (Signed)
[facility] radiology has been called and imaging cx. Left message on machine to call back at both home and work number.

## 2014-11-04 NOTE — Telephone Encounter (Signed)
Pt was notified that the MR was not covered and she states she will call her PCP and see if Dr Creig Hines orders it if it will be covered.  She will call back if it is approved. I will also forward to Dr Ardis Hughs for review.

## 2014-11-04 NOTE — Patient Instructions (Signed)

## 2014-11-05 ENCOUNTER — Ambulatory Visit (HOSPITAL_COMMUNITY): Admission: RE | Admit: 2014-11-05 | Payer: Medicaid Other | Source: Ambulatory Visit

## 2014-11-05 NOTE — Telephone Encounter (Signed)
Ok, thanks.

## 2014-11-17 ENCOUNTER — Other Ambulatory Visit: Payer: Self-pay | Admitting: *Deleted

## 2014-11-17 DIAGNOSIS — C50412 Malignant neoplasm of upper-outer quadrant of left female breast: Secondary | ICD-10-CM

## 2014-11-17 DIAGNOSIS — M545 Low back pain: Secondary | ICD-10-CM

## 2014-11-17 MED ORDER — CYCLOBENZAPRINE HCL 5 MG PO TABS: 5.0000 mg | ORAL_TABLET | Freq: Two times a day (BID) | ORAL | Status: DC | PRN

## 2014-11-18 ENCOUNTER — Ambulatory Visit: Payer: Self-pay | Admitting: Gastroenterology

## 2014-11-24 ENCOUNTER — Other Ambulatory Visit: Payer: Self-pay | Admitting: *Deleted

## 2014-11-24 ENCOUNTER — Telehealth: Payer: Self-pay | Admitting: *Deleted

## 2014-11-24 DIAGNOSIS — C50412 Malignant neoplasm of upper-outer quadrant of left female breast: Secondary | ICD-10-CM

## 2014-11-24 NOTE — Telephone Encounter (Signed)
"  I need to talk to Jessica [name] about an application she was to do for breast re-certification.  I need an update because I am to receive chemotherapy tomorrow.  I need to make sure everything is okay."  Return number is [phone].

## 2014-11-24 NOTE — Telephone Encounter (Signed)
Re-certification is not handled by this RN - pt's call being forwarded to managed care.

## 2014-11-25 ENCOUNTER — Other Ambulatory Visit (HOSPITAL_BASED_OUTPATIENT_CLINIC_OR_DEPARTMENT_OTHER): Payer: Medicaid Other

## 2014-11-25 ENCOUNTER — Encounter: Payer: Self-pay | Admitting: Oncology

## 2014-11-25 ENCOUNTER — Ambulatory Visit: Payer: Self-pay

## 2014-11-25 ENCOUNTER — Ambulatory Visit (HOSPITAL_BASED_OUTPATIENT_CLINIC_OR_DEPARTMENT_OTHER): Payer: Medicaid Other

## 2014-11-25 VITALS — BP 136/86 | HR 70 | Temp 98.2°F | Resp 18

## 2014-11-25 DIAGNOSIS — Z95828 Presence of other vascular implants and grafts: Secondary | ICD-10-CM

## 2014-11-25 DIAGNOSIS — C50412 Malignant neoplasm of upper-outer quadrant of left female breast: Secondary | ICD-10-CM | POA: Diagnosis not present

## 2014-11-25 DIAGNOSIS — Z5111 Encounter for antineoplastic chemotherapy: Secondary | ICD-10-CM

## 2014-11-25 LAB — CBC WITH DIFFERENTIAL/PLATELET
BASO%: 0.5 % (ref 0.0–2.0)
Basophils Absolute: 0 10*3/uL (ref 0.0–0.1)
EOS%: 6.5 % (ref 0.0–7.0)
Eosinophils Absolute: 0.3 10*3/uL (ref 0.0–0.5)
HCT: 39.1 % (ref 34.8–46.6)
HEMOGLOBIN: 13.4 g/dL (ref 11.6–15.9)
LYMPH%: 17.3 % (ref 14.0–49.7)
MCH: 32.8 pg (ref 25.1–34.0)
MCHC: 34.2 g/dL (ref 31.5–36.0)
MCV: 95.8 fL (ref 79.5–101.0)
MONO#: 0.3 10*3/uL (ref 0.1–0.9)
MONO%: 5.2 % (ref 0.0–14.0)
NEUT#: 3.7 10*3/uL (ref 1.5–6.5)
NEUT%: 70.5 % (ref 38.4–76.8)
Platelets: 142 10*3/uL — ABNORMAL LOW (ref 145–400)
RBC: 4.08 10*6/uL (ref 3.70–5.45)
RDW: 13.2 % (ref 11.2–14.5)
WBC: 5.3 10*3/uL (ref 3.9–10.3)
lymph#: 0.9 10*3/uL (ref 0.9–3.3)

## 2014-11-25 LAB — COMPREHENSIVE METABOLIC PANEL (CC13)
ALT: 20 U/L (ref 0–55)
ANION GAP: 7 meq/L (ref 3–11)
AST: 19 U/L (ref 5–34)
Albumin: 3.9 g/dL (ref 3.5–5.0)
Alkaline Phosphatase: 69 U/L (ref 40–150)
BILIRUBIN TOTAL: 0.38 mg/dL (ref 0.20–1.20)
BUN: 10 mg/dL (ref 7.0–26.0)
CO2: 26 mEq/L (ref 22–29)
Calcium: 9.4 mg/dL (ref 8.4–10.4)
Chloride: 109 mEq/L (ref 98–109)
Creatinine: 1 mg/dL (ref 0.6–1.1)
EGFR: 71 mL/min/{1.73_m2} — ABNORMAL LOW (ref >90)
Glucose: 101 mg/dl (ref 70–140)
POTASSIUM: 4 meq/L (ref 3.5–5.1)
SODIUM: 142 meq/L (ref 136–145)
Total Protein: 6.6 g/dL (ref 6.4–8.3)

## 2014-11-25 MED ORDER — ACETAMINOPHEN 325 MG PO TABS
650.0000 mg | ORAL_TABLET | Freq: Once | ORAL | Status: AC
  Administered 2014-11-25: 650 mg via ORAL

## 2014-11-25 MED ORDER — SODIUM CHLORIDE 0.9 % IV SOLN
Freq: Once | INTRAVENOUS | Status: AC
  Administered 2014-11-25: 12:00:00 via INTRAVENOUS

## 2014-11-25 MED ORDER — DIPHENHYDRAMINE HCL 25 MG PO CAPS
25.0000 mg | ORAL_CAPSULE | Freq: Once | ORAL | Status: AC
  Administered 2014-11-25: 25 mg via ORAL

## 2014-11-25 MED ORDER — DIPHENHYDRAMINE HCL 25 MG PO CAPS
ORAL_CAPSULE | ORAL | Status: AC
  Filled 2014-11-25: qty 1

## 2014-11-25 MED ORDER — HEPARIN SOD (PORK) LOCK FLUSH 100 UNIT/ML IV SOLN
500.0000 [IU] | Freq: Once | INTRAVENOUS | Status: AC | PRN
  Filled 2014-11-25: qty 5

## 2014-11-25 MED ORDER — SODIUM CHLORIDE 0.9 % IJ SOLN
10.0000 mL | INTRAMUSCULAR | Status: DC | PRN
  Administered 2014-11-25: 10 mL via INTRAVENOUS
  Filled 2014-11-25: qty 10

## 2014-11-25 MED ORDER — HEPARIN SOD (PORK) LOCK FLUSH 100 UNIT/ML IV SOLN
500.0000 [IU] | Freq: Once | INTRAVENOUS | Status: AC
Start: 2014-11-25 — End: 2014-11-25
  Administered 2014-11-25: 500 [IU] via INTRAVENOUS
  Filled 2014-11-25: qty 5

## 2014-11-25 MED ORDER — TRASTUZUMAB CHEMO INJECTION 440 MG
6.0000 mg/kg | Freq: Once | INTRAVENOUS | Status: AC
  Administered 2014-11-25: 546 mg via INTRAVENOUS
  Filled 2014-11-25: qty 26

## 2014-11-25 MED ORDER — SODIUM CHLORIDE 0.9 % IJ SOLN
10.0000 mL | INTRAMUSCULAR | Status: DC | PRN
Start: 2014-11-25 — End: 2014-11-25
  Administered 2014-11-25: 10 mL
  Filled 2014-11-25: qty 10

## 2014-11-25 MED ORDER — ACETAMINOPHEN 325 MG PO TABS
ORAL_TABLET | ORAL | Status: AC
  Filled 2014-11-25: qty 2

## 2014-11-25 NOTE — Patient Instructions (Signed)

## 2014-11-25 NOTE — Progress Notes (Signed)
Patient stopped me to advise about her medicaid. She said her case worker said we would have to fill out paper work?? I asked if she had [facility]CP and she said yes. I advised her to see Gabriel Cirri about recert. She told Lenise it stopped 11/16/14.

## 2014-11-25 NOTE — Patient Instructions (Signed)
[facility] Discharge Instructions for Patients Receiving Chemotherapy  Today you received the following chemotherapy agents Herceptin  To help prevent nausea and vomiting after your treatment, we encourage you to take your nausea medication    If you develop nausea and vomiting that is not controlled by your nausea medication, call the clinic.   BELOW ARE SYMPTOMS THAT SHOULD BE REPORTED IMMEDIATELY:  *FEVER GREATER THAN 100.5 F  *CHILLS WITH OR WITHOUT FEVER  NAUSEA AND VOMITING THAT IS NOT CONTROLLED WITH YOUR NAUSEA MEDICATION  *UNUSUAL SHORTNESS OF BREATH  *UNUSUAL BRUISING OR BLEEDING  TENDERNESS IN MOUTH AND THROAT WITH OR WITHOUT PRESENCE OF ULCERS  *URINARY PROBLEMS  *BOWEL PROBLEMS  UNUSUAL RASH Items with * indicate a potential emergency and should be followed up as soon as possible.  Feel free to call the clinic you have any questions or concerns. The clinic phone number is [phone].  Please show the CHEMO ALERT CARD at check-in to the Emergency Department and triage nurse.   

## 2014-12-02 NOTE — Telephone Encounter (Signed)
Jessica [name] with [employer] has been given this task per [employer] note.

## 2014-12-15 ENCOUNTER — Other Ambulatory Visit: Payer: Self-pay | Admitting: *Deleted

## 2014-12-15 DIAGNOSIS — C50412 Malignant neoplasm of upper-outer quadrant of left female breast: Secondary | ICD-10-CM

## 2014-12-16 ENCOUNTER — Ambulatory Visit: Payer: Medicaid Other

## 2014-12-16 ENCOUNTER — Other Ambulatory Visit: Payer: Self-pay | Admitting: Nurse Practitioner

## 2014-12-16 ENCOUNTER — Encounter: Payer: Self-pay | Admitting: Nurse Practitioner

## 2014-12-16 ENCOUNTER — Other Ambulatory Visit (HOSPITAL_BASED_OUTPATIENT_CLINIC_OR_DEPARTMENT_OTHER): Payer: Medicaid Other

## 2014-12-16 ENCOUNTER — Ambulatory Visit (HOSPITAL_BASED_OUTPATIENT_CLINIC_OR_DEPARTMENT_OTHER): Payer: Medicaid Other

## 2014-12-16 VITALS — BP 116/81 | HR 62 | Temp 97.1°F | Resp 17

## 2014-12-16 DIAGNOSIS — Z95828 Presence of other vascular implants and grafts: Secondary | ICD-10-CM

## 2014-12-16 DIAGNOSIS — C50412 Malignant neoplasm of upper-outer quadrant of left female breast: Secondary | ICD-10-CM | POA: Diagnosis not present

## 2014-12-16 DIAGNOSIS — Z5112 Encounter for antineoplastic immunotherapy: Secondary | ICD-10-CM | POA: Diagnosis not present

## 2014-12-16 LAB — COMPREHENSIVE METABOLIC PANEL (CC13)
ALT: 15 U/L (ref 0–55)
AST: 12 U/L (ref 5–34)
Albumin: 4 g/dL (ref 3.5–5.0)
Alkaline Phosphatase: 62 U/L (ref 40–150)
Anion Gap: 9 mEq/L (ref 3–11)
BUN: 12.2 mg/dL (ref 7.0–26.0)
CO2: 25 meq/L (ref 22–29)
Calcium: 9.6 mg/dL (ref 8.4–10.4)
Chloride: 106 mEq/L (ref 98–109)
Creatinine: 0.8 mg/dL (ref 0.6–1.1)
EGFR: 82 mL/min/{1.73_m2} — AB (ref >90)
Glucose: 114 mg/dl (ref 70–140)
POTASSIUM: 4.2 meq/L (ref 3.5–5.1)
SODIUM: 141 meq/L (ref 136–145)
Total Bilirubin: 0.44 mg/dL (ref 0.20–1.20)
Total Protein: 7 g/dL (ref 6.4–8.3)

## 2014-12-16 LAB — CBC WITH DIFFERENTIAL/PLATELET
BASO%: 0.2 % (ref 0.0–2.0)
BASOS ABS: 0 10*3/uL (ref 0.0–0.1)
EOS%: 4.9 % (ref 0.0–7.0)
Eosinophils Absolute: 0.3 10*3/uL (ref 0.0–0.5)
HCT: 38.8 % (ref 34.8–46.6)
HGB: 13.5 g/dL (ref 11.6–15.9)
LYMPH%: 17.2 % (ref 14.0–49.7)
MCH: 32.4 pg (ref 25.1–34.0)
MCHC: 34.8 g/dL (ref 31.5–36.0)
MCV: 93 fL (ref 79.5–101.0)
MONO#: 0.3 10*3/uL (ref 0.1–0.9)
MONO%: 4.3 % (ref 0.0–14.0)
NEUT%: 73.4 % (ref 38.4–76.8)
NEUTROS ABS: 4.2 10*3/uL (ref 1.5–6.5)
Platelets: 128 10*3/uL — ABNORMAL LOW (ref 145–400)
RBC: 4.17 10*6/uL (ref 3.70–5.45)
RDW: 13.3 % (ref 11.2–14.5)
WBC: 5.8 10*3/uL (ref 3.9–10.3)
lymph#: 1 10*3/uL (ref 0.9–3.3)

## 2014-12-16 MED ORDER — DIPHENHYDRAMINE HCL 25 MG PO CAPS
ORAL_CAPSULE | ORAL | Status: AC
  Filled 2014-12-16: qty 1

## 2014-12-16 MED ORDER — ACETAMINOPHEN 325 MG PO TABS
650.0000 mg | ORAL_TABLET | Freq: Once | ORAL | Status: AC
  Administered 2014-12-16: 650 mg via ORAL

## 2014-12-16 MED ORDER — SODIUM CHLORIDE 0.9 % IJ SOLN
10.0000 mL | INTRAMUSCULAR | Status: DC | PRN
  Administered 2014-12-16: 10 mL via INTRAVENOUS
  Filled 2014-12-16: qty 10

## 2014-12-16 MED ORDER — ACETAMINOPHEN 325 MG PO TABS
ORAL_TABLET | ORAL | Status: AC
  Filled 2014-12-16: qty 2

## 2014-12-16 MED ORDER — SODIUM CHLORIDE 0.9 % IV SOLN
Freq: Once | INTRAVENOUS | Status: AC
  Administered 2014-12-16: 12:00:00 via INTRAVENOUS

## 2014-12-16 MED ORDER — HEPARIN SOD (PORK) LOCK FLUSH 100 UNIT/ML IV SOLN
500.0000 [IU] | Freq: Once | INTRAVENOUS | Status: AC | PRN
  Administered 2014-12-16: 500 [IU]
  Filled 2014-12-16: qty 5

## 2014-12-16 MED ORDER — DIPHENHYDRAMINE HCL 25 MG PO CAPS
25.0000 mg | ORAL_CAPSULE | Freq: Once | ORAL | Status: AC
  Administered 2014-12-16: 25 mg via ORAL

## 2014-12-16 MED ORDER — TRASTUZUMAB CHEMO INJECTION 440 MG
6.0000 mg/kg | Freq: Once | INTRAVENOUS | Status: AC
  Administered 2014-12-16: 546 mg via INTRAVENOUS
  Filled 2014-12-16: qty 26

## 2014-12-16 MED ORDER — SODIUM CHLORIDE 0.9 % IJ SOLN
10.0000 mL | INTRAMUSCULAR | Status: DC | PRN
  Administered 2014-12-16: 10 mL
  Filled 2014-12-16: qty 10

## 2014-12-16 NOTE — Patient Instructions (Signed)

## 2014-12-16 NOTE — Patient Instructions (Signed)
[facility] Discharge Instructions for Patients Receiving Chemotherapy  Today you received the following chemotherapy agents Herceptin.  To help prevent nausea and vomiting after your treatment, we encourage you to take your nausea medication as prescribed by your physician. If you develop nausea and vomiting that is not controlled by your nausea medication, call the clinic.   BELOW ARE SYMPTOMS THAT SHOULD BE REPORTED IMMEDIATELY:  *FEVER GREATER THAN 100.5 F  *CHILLS WITH OR WITHOUT FEVER  NAUSEA AND VOMITING THAT IS NOT CONTROLLED WITH YOUR NAUSEA MEDICATION  *UNUSUAL SHORTNESS OF BREATH  *UNUSUAL BRUISING OR BLEEDING  TENDERNESS IN MOUTH AND THROAT WITH OR WITHOUT PRESENCE OF ULCERS  *URINARY PROBLEMS  *BOWEL PROBLEMS  UNUSUAL RASH Items with * indicate a potential emergency and should be followed up as soon as possible.  Feel free to call the clinic you have any questions or concerns. The clinic phone number is [phone].  Please show the CHEMO ALERT CARD at check-in to the Emergency Department and triage nurse.   

## 2014-12-31 ENCOUNTER — Other Ambulatory Visit: Payer: Self-pay | Admitting: Orthopedic Surgery

## 2015-01-05 ENCOUNTER — Other Ambulatory Visit: Payer: Self-pay

## 2015-01-05 DIAGNOSIS — C50412 Malignant neoplasm of upper-outer quadrant of left female breast: Secondary | ICD-10-CM

## 2015-01-06 ENCOUNTER — Ambulatory Visit: Payer: Medicaid Other

## 2015-01-06 ENCOUNTER — Ambulatory Visit (HOSPITAL_BASED_OUTPATIENT_CLINIC_OR_DEPARTMENT_OTHER): Payer: Medicaid Other

## 2015-01-06 ENCOUNTER — Telehealth: Payer: Self-pay | Admitting: Oncology

## 2015-01-06 ENCOUNTER — Other Ambulatory Visit (HOSPITAL_BASED_OUTPATIENT_CLINIC_OR_DEPARTMENT_OTHER): Payer: Medicaid Other

## 2015-01-06 ENCOUNTER — Ambulatory Visit (HOSPITAL_BASED_OUTPATIENT_CLINIC_OR_DEPARTMENT_OTHER): Payer: Medicaid Other | Admitting: Oncology

## 2015-01-06 VITALS — BP 129/78 | HR 64 | Temp 98.2°F | Resp 18 | Ht 65.0 in | Wt 195.6 lb

## 2015-01-06 DIAGNOSIS — C50412 Malignant neoplasm of upper-outer quadrant of left female breast: Secondary | ICD-10-CM | POA: Diagnosis present

## 2015-01-06 DIAGNOSIS — Z5112 Encounter for antineoplastic immunotherapy: Secondary | ICD-10-CM

## 2015-01-06 DIAGNOSIS — Z720 Tobacco use: Secondary | ICD-10-CM | POA: Diagnosis not present

## 2015-01-06 DIAGNOSIS — Z23 Encounter for immunization: Secondary | ICD-10-CM

## 2015-01-06 DIAGNOSIS — Z95828 Presence of other vascular implants and grafts: Secondary | ICD-10-CM

## 2015-01-06 DIAGNOSIS — F17200 Nicotine dependence, unspecified, uncomplicated: Secondary | ICD-10-CM

## 2015-01-06 LAB — COMPREHENSIVE METABOLIC PANEL (CC13)
ALBUMIN: 4 g/dL (ref 3.5–5.0)
ALK PHOS: 64 U/L (ref 40–150)
ALT: 18 U/L (ref 0–55)
ANION GAP: 7 meq/L (ref 3–11)
AST: 14 U/L (ref 5–34)
BUN: 10.4 mg/dL (ref 7.0–26.0)
CALCIUM: 9.2 mg/dL (ref 8.4–10.4)
CHLORIDE: 109 meq/L (ref 98–109)
CO2: 25 mEq/L (ref 22–29)
Creatinine: 0.8 mg/dL (ref 0.6–1.1)
EGFR: 85 mL/min/{1.73_m2} — AB (ref >90)
Glucose: 92 mg/dl (ref 70–140)
POTASSIUM: 4.1 meq/L (ref 3.5–5.1)
Sodium: 141 mEq/L (ref 136–145)
Total Bilirubin: 0.37 mg/dL (ref 0.20–1.20)
Total Protein: 6.7 g/dL (ref 6.4–8.3)

## 2015-01-06 LAB — CBC WITH DIFFERENTIAL/PLATELET
BASO%: 0.5 % (ref 0.0–2.0)
BASOS ABS: 0 10*3/uL (ref 0.0–0.1)
EOS ABS: 0.3 10*3/uL (ref 0.0–0.5)
EOS%: 5.4 % (ref 0.0–7.0)
HEMATOCRIT: 39.1 % (ref 34.8–46.6)
HEMOGLOBIN: 13.5 g/dL (ref 11.6–15.9)
LYMPH#: 1 10*3/uL (ref 0.9–3.3)
LYMPH%: 17.6 % (ref 14.0–49.7)
MCH: 32.5 pg (ref 25.1–34.0)
MCHC: 34.5 g/dL (ref 31.5–36.0)
MCV: 94.1 fL (ref 79.5–101.0)
MONO#: 0.3 10*3/uL (ref 0.1–0.9)
MONO%: 5.7 % (ref 0.0–14.0)
NEUT#: 4.1 10*3/uL (ref 1.5–6.5)
NEUT%: 70.8 % (ref 38.4–76.8)
Platelets: 116 10*3/uL — ABNORMAL LOW (ref 145–400)
RBC: 4.16 10*6/uL (ref 3.70–5.45)
RDW: 13.7 % (ref 11.2–14.5)
WBC: 5.8 10*3/uL (ref 3.9–10.3)

## 2015-01-06 MED ORDER — HEPARIN SOD (PORK) LOCK FLUSH 100 UNIT/ML IV SOLN
500.0000 [IU] | Freq: Once | INTRAVENOUS | Status: AC | PRN
  Administered 2015-01-06: 500 [IU]
  Filled 2015-01-06: qty 5

## 2015-01-06 MED ORDER — DIPHENHYDRAMINE HCL 25 MG PO CAPS
25.0000 mg | ORAL_CAPSULE | Freq: Once | ORAL | Status: AC
  Administered 2015-01-06: 25 mg via ORAL

## 2015-01-06 MED ORDER — SODIUM CHLORIDE 0.9 % IJ SOLN
10.0000 mL | INTRAMUSCULAR | Status: DC | PRN
  Administered 2015-01-06: 10 mL via INTRAVENOUS
  Filled 2015-01-06: qty 10

## 2015-01-06 MED ORDER — DIPHENHYDRAMINE HCL 25 MG PO CAPS
ORAL_CAPSULE | ORAL | Status: AC
  Filled 2015-01-06: qty 1

## 2015-01-06 MED ORDER — ACETAMINOPHEN 325 MG PO TABS
ORAL_TABLET | ORAL | Status: AC
  Filled 2015-01-06: qty 2

## 2015-01-06 MED ORDER — ACETAMINOPHEN 325 MG PO TABS
650.0000 mg | ORAL_TABLET | Freq: Once | ORAL | Status: AC
Start: 2015-01-06 — End: 2015-01-06
  Administered 2015-01-06: 650 mg via ORAL

## 2015-01-06 MED ORDER — VENLAFAXINE HCL ER 75 MG PO CP24: 75.0000 mg | ORAL_CAPSULE | Freq: Every day | ORAL | Status: DC

## 2015-01-06 MED ORDER — TRASTUZUMAB CHEMO INJECTION 440 MG
6.0000 mg/kg | Freq: Once | INTRAVENOUS | Status: AC
  Administered 2015-01-06: 546 mg via INTRAVENOUS
  Filled 2015-01-06: qty 26

## 2015-01-06 MED ORDER — SODIUM CHLORIDE 0.9 % IJ SOLN
10.0000 mL | INTRAMUSCULAR | Status: DC | PRN
  Administered 2015-01-06: 10 mL
  Filled 2015-01-06: qty 10

## 2015-01-06 MED ORDER — SODIUM CHLORIDE 0.9 % IV SOLN
Freq: Once | INTRAVENOUS | Status: AC
  Administered 2015-01-06: 11:00:00 via INTRAVENOUS

## 2015-01-06 MED ORDER — INFLUENZA VAC SPLIT QUAD 0.5 ML IM SUSY
0.5000 mL | PREFILLED_SYRINGE | Freq: Once | INTRAMUSCULAR | Status: AC
  Administered 2015-01-06: 0.5 mL via INTRAMUSCULAR
  Filled 2015-01-06: qty 0.5

## 2015-01-06 NOTE — Progress Notes (Signed)
Jessica [name]  [phone] [phone]     ID: Jessica [name] DOB: [birth year]  MR#: [id number]  LJQ#:[id number]  Patient Care Team: Jessica Pretty, [name] as PCP - General (Family Medicine) Jessica Bookbinder, [name] as Consulting Physician (General Surgery) Jessica Cruel, [name] as Consulting Physician (Oncology) Jessica Rudd, [name] as Consulting Physician (Radiation Oncology) Jessica Schools, [name] as Consulting Physician (Orthopedic Surgery)  OTHER: Jessica [name], Jessica Fendt [name]  CHIEF COMPLAINT: Newly diagnosed triple positive breast cancer  CURRENT TREATMENT: Herceptin; radiation  BREAST CANCER HISTORY: From the original intake note:  Jessica [name] herself palpated a mass in her left breast. She brought it to the attention of Jessica [name] at the health department and she was set up for unilateral left mammography and ultrasound 11/27/2013 at the breast Center. This showed a possible distortion in the left breast upper outer quadrant. However the patient's breasts are density category D. On exam there was a firm nodule or mass in the left breast 2:00 location which by ultrasound was irregular and hypoechoic and measured 1.5 cm. There was no left axillary lymphadenopathy noted.  Biopsy of the mass in question 11/29/2013 showed ([id number]) an invasive ductal carcinoma, grade 1, estrogen receptor 94% positive, progesterone receptor 94% positive, both with strong staining intensity, with an MIB-1 of 46%, and HER-2 amplified by CISH, the signals ratio of 4.85 and the number per cell 6.55.  On 12/08/2013 the patient underwent bilateral breast MRI. This showed in the left breast upper outer quadrant a 2.9 cm irregular enhancing mass and extending posteriorly from this an area of clumped non-masslike enhancement extending approximately 6 cm and leading to a posterior group of nodules which were small but measured in aggregate 2.2 cm. In addition, there was a separate 0.8 cm  irregular spiculated enhancing mass in the upper inner aspect of the left breast. In the lower outer left breast there was a 0.8 cm oval enhancing mass. There were no abnormal appearing lymph nodes.  [name] subsequent history is as detailed below  INTERVAL HISTORY: Jessica [name] returns today for follow up of her breast cancer accompanied by her significant other class. She started tamoxifen approximately 3 months ago. She is having a hard time of it. She is very irritable. She is feeling road rage. She is nasty to Jessica [name]. She is ha. ving "on real" hot flashes during the [name], and [name] bad night sweats at night as well. She's not having any vaginal wetness issues. She is obtaining the tamoxifen at about $13 a month  REVIEW OF SYSTEMS:  she just had right thumb surgery and is recovering nicely from that. She is still working full-time. A detailed review of systems today was otherwise stable   PAST MEDICAL HISTORY: Past Medical History  Diagnosis Date  . Anxiety   . COPD (chronic obstructive pulmonary disease) 2011    does not use inhaler ofter  . Depression   . Hot flashes   . Family history of anesthesia complication     Had a hard time waking father up only once after several procedures  . Shortness of breath     with exertion  . Chronic heartburn   . PVC (premature ventricular contraction)     PMH;at 49 y.o.  . Asthma     denies history of asthma  . Heart murmur     PMH: at age 28; no longer have murmur  . 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  Cancer     DCIS    PAST SURGICAL HISTORY: Past Surgical History  Procedure Laterality Date  . Polyp removal     . Tonsillectomy    . Wisdom tooth extraction    . Robotic assisted total hysterectomy with bilateral salpingo oopherectomy Bilateral 05/24/2013    Procedure: ROBOTIC ASSISTED TOTAL HYSTERECTOMY WITH BILATERAL SALPINGECTOMY;  Surgeon: Jessica Drafts, [name];  Location: [facility];   Service: Gynecology;  Laterality: Bilateral;  . Cystoscopy N/A 05/24/2013    Procedure: CYSTOSCOPY;  Surgeon: Jessica Drafts, [name];  Location: [facility];  Service: Gynecology;  Laterality: N/A;  . Abdominal hysterectomy    . Breast surgery      left breast biopsy x 2  . Portacath placement Right 01/02/2014    Procedure: INSERTION PORT-A-CATH;  Surgeon: Jessica Bookbinder, [name];  Location: [facility];  Service: General;  Laterality: Right;  . Radioactive seed guided mastectomy with axillary sentinel lymph node biopsy Left 05/29/2014    Procedure: RADIOACTIVE SEED GUIDED LEFT PARTIAL MASTECTOMY WITH AXILLARY SENTINEL LYMPH NODE BIOPSY;  Surgeon: Jessica Bookbinder, [name];  Location: [facility];  Service: General;  Laterality: Left;    FAMILY HISTORY Family History  Problem Relation Age of Onset  . Heart disease Mother   . Hypercholesterolemia Mother   . Heart disease Father   . COPD Father   . Hypercholesterolemia Father   . Cancer Neg Hx   [name] parents are still living, her father is 26 years old and her mother 32 years old as of August 2015. The patient had no brothers, one sister. There is no history of breast or ovarian cancer in the family.  GYNECOLOGIC HISTORY:  Patient's last menstrual period was 03/18/2013. Menarche age 77. She is GX P0. She took oral contraceptives for many years as well as Depo Provera shots. She is status post hysterectomy with bilateral salpingectomy. 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 She scans at work, and she also does all the shelving and stocking. She is single but for the last 11 years as lives with her significant other Jessica [name]. He is disabled secondary to multiple orthopedic problems including bilateral avascular necrosis of the hips. He also had a R-body CVA January 2016. Both of them do smoke.     ADVANCED DIRECTIVES: Not in place    HEALTH MAINTENANCE: Social History    Substance Use Topics  . Smoking status: Current Every [name] Smoker -- 1.00 packs/[name] for 16 years    Types: Cigarettes  . Smokeless tobacco: Never Used     Comment: 1/2ppd decreased  . Alcohol Use: 0.0 oz/week    0 Standard drinks or equivalent per week     Comment: occasional      Colonoscopy:  PAP: January 2015   Bone density:  Lipid panel:  No Known Allergies  Current Outpatient Prescriptions  Medication Sig Dispense Refill  . acetaminophen (TYLENOL) 325 MG tablet Take 650 mg by mouth every 6 (six) hours as needed.    . budesonide-formoterol (SYMBICORT) 160-4.5 MCG/ACT inhaler Inhale 1 puff into the lungs 2 (two) times daily as needed (for shortness of breath).     . calcium carbonate (TUMS - DOSED IN MG ELEMENTAL CALCIUM) 500 MG chewable tablet Chew 1 tablet by mouth daily.    . cyclobenzaprine (FLEXERIL) 5 MG tablet Take 1 tablet (5 mg total) by mouth 2 (two) times daily as needed for muscle spasms. Rudy  tablet 0  . eszopiclone (LUNESTA) 1 MG TABS tablet TAKE 1 TABLET BY MOUTH AT BEDTIME AS NEEDED FOR SLEEP. TAKE IMMEDIATELY BEFORE BEDTIME 30 tablet 0  . gabapentin (NEURONTIN) 300 MG capsule Take 1 capsule (300 mg total) by mouth 3 (three) times daily. 90 capsule 3  . hyaluronate sodium (RADIAPLEXRX) GEL Apply 1 application topically 2 (two) times daily.    Marland Kitchen ipratropium (ATROVENT HFA) 17 MCG/ACT inhaler Inhale 1 puff into the lungs every 6 (six) hours as needed for wheezing (SOB).     Marland Kitchen loratadine (CLARITIN) 10 MG tablet Take 1 tablet (10 mg total) by mouth as needed. 90 tablet 1  . omeprazole (PRILOSEC) 40 MG capsule TAKE 1 CAPSULE BY MOUTH ONCE DAILY 30 capsule 2  . oxyCODONE-acetaminophen (PERCOCET/ROXICET) 5-325 MG per tablet Take 1 tablet by mouth every 4 (four) hours as needed for severe pain. 40 tablet 0  . tamoxifen (NOLVADEX) 20 MG tablet Take 20 mg by mouth daily.    Marland Kitchen zolpidem (AMBIEN) 5 MG tablet Take 1 tablet (5 mg total) by mouth at bedtime as needed for sleep. 90  tablet 3   No current facility-administered medications for this visit.    OBJECTIVE: middle-aged white woman in no acute distress  Filed Vitals:   01/06/15 0946  BP: 129/78  Pulse: 64  Temp: 98.2 F (36.8 C)  Resp: 18     Body mass index is 32.55 kg/(m^2).    ECOG FS:1 - Symptomatic but completely ambulatory   Sclerae unicteric, EOMs intact Oropharynx clear and moist No cervical or supraclavicular adenopathy Lungs no rales or rhonchi Heart regular rate and rhythm Abd soft, nontender, positive bowel sounds MSK no focal spinal tenderness, no upper extremity lymphedema; the right thumb is in a splint Neuro: nonfocal, well oriented, appropriate affect Breasts: The right breast is unremarkable. The left breast is status post lumpectomy and radiation. There is no evidence of local recurrence. The left axilla is benign.    LAB RESULTS:  CMP     Component Value Date/Time   NA 141 12/16/2014 1033   NA 139 12/31/2013 1448   K 4.2 12/16/2014 1033   K 4.6 12/31/2013 1448   CL 103 12/31/2013 1448   CO2 25 12/16/2014 1033   CO2 24 12/31/2013 1448   GLUCOSE 114 12/16/2014 1033   GLUCOSE 93 12/31/2013 1448   BUN 12.2 12/16/2014 1033   BUN 9 12/31/2013 1448   CREATININE 0.8 12/16/2014 1033   CREATININE 0.83 12/31/2013 1448   CALCIUM 9.6 12/16/2014 1033   CALCIUM 8.8 12/31/2013 1448   PROT 7.0 12/16/2014 1033   PROT 7.1 06/04/2013 1511   ALBUMIN 4.0 12/16/2014 1033   ALBUMIN 3.4* 06/04/2013 1511   AST 12 12/16/2014 1033   AST 12 06/04/2013 1511   ALT 15 12/16/2014 1033   ALT 14 06/04/2013 1511   ALKPHOS 62 12/16/2014 1033   ALKPHOS 70 06/04/2013 1511   BILITOT 0.44 12/16/2014 1033   BILITOT 0.5 06/04/2013 1511   GFRNONAA 83* 12/31/2013 1448   GFRAA >90 12/31/2013 1448    I No results found for: SPEP  Lab Results  Component Value Date   WBC 5.8 01/06/2015   NEUTROABS 4.1 01/06/2015   HGB 13.5 01/06/2015   HCT 39.1 01/06/2015   MCV 94.1 01/06/2015   PLT 116*  01/06/2015      Chemistry      Component Value Date/Time   NA 141 12/16/2014 1033   NA 139 12/31/2013 1448   K  4.2 12/16/2014 1033   K 4.6 12/31/2013 1448   CL 103 12/31/2013 1448   CO2 25 12/16/2014 1033   CO2 24 12/31/2013 1448   BUN 12.2 12/16/2014 1033   BUN 9 12/31/2013 1448   CREATININE 0.8 12/16/2014 1033   CREATININE 0.83 12/31/2013 1448      Component Value Date/Time   CALCIUM 9.6 12/16/2014 1033   CALCIUM 8.8 12/31/2013 1448   ALKPHOS 62 12/16/2014 1033   ALKPHOS 70 06/04/2013 1511   AST 12 12/16/2014 1033   AST 12 06/04/2013 1511   ALT 15 12/16/2014 1033   ALT 14 06/04/2013 1511   BILITOT 0.44 12/16/2014 1033   BILITOT 0.5 06/04/2013 1511       No results found for: LABCA2  No components found for: LABCA125  No results for input(s): INR in the last 168 hours.  Urinalysis    Component Value Date/Time   COLORURINE YELLOW 06/04/2013 1420   APPEARANCEUR HAZY* 06/04/2013 1420   LABSPEC 1.030 01/27/2014 1350   LABSPEC >1.030* 06/04/2013 1420   PHURINE 5.0 01/27/2014 1350   PHURINE 6.0 06/04/2013 1420   GLUCOSEU Negative 01/27/2014 1350   GLUCOSEU NEGATIVE 06/04/2013 1420   HGBUR Negative 01/27/2014 1350   HGBUR MODERATE* 06/04/2013 1420   HGBUR negative 10/07/2009 1044   BILIRUBINUR Color Interference 01/27/2014 1350   BILIRUBINUR NEGATIVE 06/04/2013 1420   KETONESUR Color Interference 01/27/2014 1350   KETONESUR NEGATIVE 06/04/2013 1420   PROTEINUR Color Interference 01/27/2014 1350   PROTEINUR NEGATIVE 06/04/2013 1420   UROBILINOGEN Color Interference 01/27/2014 1350   UROBILINOGEN 0.2 06/04/2013 1420   NITRITE Color Interference 01/27/2014 1350   NITRITE NEGATIVE 06/04/2013 1420   LEUKOCYTESUR Color Interference 01/27/2014 1350   LEUKOCYTESUR NEGATIVE 06/04/2013 1420    STUDIES:  No results found.  ASSESSMENT: 49 y.o. BRCA negative [place] woman status post left breast upper outer quadrant biopsy 11/29/2013 for a clinical T2 N0, stage  IIA invasive ductal carcinoma, grade 1, triple positive, with an MIB-1 of 46%.  (1) MRI-guided biopsy of two additional areas in the left breast showed only tubular adenomas, no malignancy.   (2) neoadjuvant chemotherapy started 01/14/2014, consisting of carboplatin, docetaxel, trastuzumab and pertuzumab given every 21 days for 6 cycles, with Neulasta on [name] 2. Completed 04/30/14  (3) trastuzumab to be continued to the total one year, last dose 01/06/2015  (a) most recent echo 10/06/2014 shows a well-preserved ejection fraction  (4) status post left lumpectomy with axillary sentinel lymph node biopsy on 05/29/2014 showing a residual ypT1c ypN0 invasive ductal carcinoma, grade 2, with negative margins   (5) adjuvant radiation completed 08/27/2014  (6) started tamoxifen 09/30/2014  (a) normal bone scan 08/28/2014  (7) abnormal radiotracer uptake in distal left femur evaluated with CT-guided biopsy 06/20/2014, showing enchondroma, with low cellularity, no mitotic figures, and no atypia.  (8) persistent low pelvic discomfort, with unremarkable transvaginal ultrasonography 12/12/2013 and CT of the abdomen and pelvis 08/29/2014  (9) status post remote total abdominal hysterectomy with bilateral salpingo-oophorectomy  PLAN: Belenda is having significant problems with the tamoxifen. The main issues are the hot flashes/night sweats. There are also some mood issues.  I think both those problems can be best dealt with with venlafaxine. Were going to start with 75 mg daily and go 250 after 2 weeks if there is no significant improvement.  She has also not been taking the nighttime gabapentin regularly. I suggested she do that and if after a few days she does not find it helpful  then double the dose to 600 mg at bedtime.  I am hopeful that with these changes she will be able to tolerate the tamoxifen, but we are going to see her again in 2 months and if she really cannot tolerate this drug we will stop  it and switch to anastrozole   I think she would be a great candidate for the finding your new normal study. I have encouraged her and her significant other signed on.  I am also setting her up to have her port removed. She finishes her year of Herceptin today. I'm setting her up for repeat echo, the final one, late October  She will calls in about a month to let us know how she is doing with these changes. If she is still having significant problems with hot flashes, we will up the venlafaxine 250 mg. If the problems have not really significantly improve when we see her again in 2 months we will consider switching to an aromatase inhibitor area   MAGRINAT,GUSTAV C, [name]   01/06/2015 10:00 AM

## 2015-01-06 NOTE — Telephone Encounter (Signed)
Appointments made and avs printed for patient echo to [facility] for precert

## 2015-01-06 NOTE — Patient Instructions (Addendum)
[facility] Discharge Instructions for Patients Receiving Chemotherapy  Today you received the following chemotherapy agents;  Herceptin.  CONGRATULATIONS ON YOUR LAST TREATMENT!!! WOO HOO!!!   BELOW ARE SYMPTOMS THAT SHOULD BE REPORTED IMMEDIATELY:  *FEVER GREATER THAN 100.5 F  *CHILLS WITH OR WITHOUT FEVER  NAUSEA AND VOMITING THAT IS NOT CONTROLLED WITH YOUR NAUSEA MEDICATION  *UNUSUAL SHORTNESS OF BREATH  *UNUSUAL BRUISING OR BLEEDING  TENDERNESS IN MOUTH AND THROAT WITH OR WITHOUT PRESENCE OF ULCERS  *URINARY PROBLEMS  *BOWEL PROBLEMS  UNUSUAL RASH Items with * indicate a potential emergency and should be followed up as soon as possible.  Feel free to call the clinic you have any questions or concerns. The clinic phone number is [phone].  Please show the [facility] at check-in to the Emergency Department and triage nurse.

## 2015-01-06 NOTE — Telephone Encounter (Signed)
Called and left a message with echo appointment °

## 2015-01-06 NOTE — Patient Instructions (Signed)

## 2015-01-27 ENCOUNTER — Ambulatory Visit: Payer: Self-pay

## 2015-02-10 ENCOUNTER — Ambulatory Visit (HOSPITAL_COMMUNITY)
Admission: RE | Admit: 2015-02-10 | Discharge: 2015-02-10 | Disposition: A | Discharge status: Home / Self Care | Payer: Medicaid Other | Source: Ambulatory Visit | Attending: Oncology | Admitting: Oncology

## 2015-02-10 DIAGNOSIS — F17200 Nicotine dependence, unspecified, uncomplicated: Secondary | ICD-10-CM | POA: Diagnosis not present

## 2015-02-10 DIAGNOSIS — I517 Cardiomegaly: Secondary | ICD-10-CM | POA: Diagnosis not present

## 2015-02-10 DIAGNOSIS — C50412 Malignant neoplasm of upper-outer quadrant of left female breast: Secondary | ICD-10-CM | POA: Insufficient documentation

## 2015-02-10 DIAGNOSIS — I071 Rheumatic tricuspid insufficiency: Secondary | ICD-10-CM | POA: Diagnosis not present

## 2015-02-10 NOTE — Progress Notes (Signed)
  Echocardiogram 2D Echocardiogram has been performed.  [name] 02/10/2015, 10:16 AM

## 2015-03-11 ENCOUNTER — Other Ambulatory Visit: Payer: Self-pay | Admitting: *Deleted

## 2015-03-11 MED ORDER — GABAPENTIN 300 MG PO CAPS: 300.0000 mg | ORAL_CAPSULE | Freq: Three times a day (TID) | ORAL | Status: DC

## 2015-03-16 ENCOUNTER — Other Ambulatory Visit: Payer: Self-pay

## 2015-03-16 DIAGNOSIS — C50412 Malignant neoplasm of upper-outer quadrant of left female breast: Secondary | ICD-10-CM

## 2015-03-17 ENCOUNTER — Encounter: Payer: Self-pay | Admitting: Nurse Practitioner

## 2015-03-17 ENCOUNTER — Other Ambulatory Visit: Payer: Self-pay | Admitting: *Deleted

## 2015-03-17 ENCOUNTER — Telehealth: Payer: Self-pay | Admitting: Oncology

## 2015-03-17 ENCOUNTER — Ambulatory Visit (HOSPITAL_BASED_OUTPATIENT_CLINIC_OR_DEPARTMENT_OTHER): Payer: Medicaid Other

## 2015-03-17 ENCOUNTER — Ambulatory Visit (HOSPITAL_BASED_OUTPATIENT_CLINIC_OR_DEPARTMENT_OTHER): Payer: Medicaid Other | Admitting: Nurse Practitioner

## 2015-03-17 ENCOUNTER — Other Ambulatory Visit (HOSPITAL_BASED_OUTPATIENT_CLINIC_OR_DEPARTMENT_OTHER): Payer: Medicaid Other

## 2015-03-17 VITALS — BP 138/74 | HR 74 | Temp 98.2°F | Resp 18 | Ht 65.0 in | Wt 195.6 lb

## 2015-03-17 DIAGNOSIS — Z95828 Presence of other vascular implants and grafts: Secondary | ICD-10-CM

## 2015-03-17 DIAGNOSIS — F329 Major depressive disorder, single episode, unspecified: Secondary | ICD-10-CM

## 2015-03-17 DIAGNOSIS — R232 Flushing: Secondary | ICD-10-CM

## 2015-03-17 DIAGNOSIS — C50412 Malignant neoplasm of upper-outer quadrant of left female breast: Secondary | ICD-10-CM | POA: Diagnosis present

## 2015-03-17 DIAGNOSIS — N951 Menopausal and female climacteric states: Secondary | ICD-10-CM | POA: Diagnosis not present

## 2015-03-17 DIAGNOSIS — F32A Depression, unspecified: Secondary | ICD-10-CM

## 2015-03-17 LAB — CBC WITH DIFFERENTIAL/PLATELET
BASO%: 0.5 % (ref 0.0–2.0)
Basophils Absolute: 0 10*3/uL (ref 0.0–0.1)
EOS ABS: 0.4 10*3/uL (ref 0.0–0.5)
EOS%: 5 % (ref 0.0–7.0)
HCT: 38.8 % (ref 34.8–46.6)
HEMOGLOBIN: 13.1 g/dL (ref 11.6–15.9)
LYMPH%: 14.2 % (ref 14.0–49.7)
MCH: 32.2 pg (ref 25.1–34.0)
MCHC: 33.7 g/dL (ref 31.5–36.0)
MCV: 95.3 fL (ref 79.5–101.0)
MONO#: 0.3 10*3/uL (ref 0.1–0.9)
MONO%: 4.9 % (ref 0.0–14.0)
NEUT%: 75.4 % (ref 38.4–76.8)
NEUTROS ABS: 5.3 10*3/uL (ref 1.5–6.5)
PLATELETS: 180 10*3/uL (ref 145–400)
RBC: 4.07 10*6/uL (ref 3.70–5.45)
RDW: 13.9 % (ref 11.2–14.5)
WBC: 7.1 10*3/uL (ref 3.9–10.3)
lymph#: 1 10*3/uL (ref 0.9–3.3)

## 2015-03-17 LAB — COMPREHENSIVE METABOLIC PANEL (CC13)
ALBUMIN: 3.8 g/dL (ref 3.5–5.0)
ALK PHOS: 59 U/L (ref 40–150)
ALT: 15 U/L (ref 0–55)
AST: 16 U/L (ref 5–34)
Anion Gap: 8 mEq/L (ref 3–11)
BILIRUBIN TOTAL: 0.43 mg/dL (ref 0.20–1.20)
BUN: 8.7 mg/dL (ref 7.0–26.0)
CO2: 27 meq/L (ref 22–29)
CREATININE: 0.9 mg/dL (ref 0.6–1.1)
Calcium: 9.5 mg/dL (ref 8.4–10.4)
Chloride: 107 mEq/L (ref 98–109)
EGFR: 80 mL/min/{1.73_m2} — ABNORMAL LOW (ref >90)
GLUCOSE: 104 mg/dL (ref 70–140)
Potassium: 3.7 mEq/L (ref 3.5–5.1)
SODIUM: 141 meq/L (ref 136–145)
TOTAL PROTEIN: 6.7 g/dL (ref 6.4–8.3)

## 2015-03-17 MED ORDER — HEPARIN SOD (PORK) LOCK FLUSH 100 UNIT/ML IV SOLN
500.0000 [IU] | Freq: Once | INTRAVENOUS | Status: AC
  Administered 2015-03-17: 500 [IU] via INTRAVENOUS
  Filled 2015-03-17: qty 5

## 2015-03-17 MED ORDER — TAMOXIFEN CITRATE 20 MG PO TABS: 20.0000 mg | ORAL_TABLET | Freq: Every day | ORAL | Status: DC

## 2015-03-17 MED ORDER — SODIUM CHLORIDE 0.9 % IJ SOLN
10.0000 mL | INTRAMUSCULAR | Status: DC | PRN
  Administered 2015-03-17: 10 mL via INTRAVENOUS
  Filled 2015-03-17: qty 10

## 2015-03-17 MED ORDER — VENLAFAXINE HCL ER 75 MG PO CP24
ORAL_CAPSULE | ORAL | Status: DC
Start: 2015-03-17 — End: 2015-12-24

## 2015-03-17 NOTE — Progress Notes (Signed)
Jessica [name]  [phone] [phone]) [phone]    ID: Jessica [name] DOB: Dec 17, 1965  MR#: [id number]  QMV#:[id number]  Patient Care Team: Chevis Pretty, MD as PCP - General (Family Medicine) [name], MD as Consulting Physician (General Surgery) [name], MD as Consulting Physician (Oncology) Kyung Rudd, MD as Consulting Physician (Radiation Oncology) Melina Schools, MD as Consulting Physician (Orthopedic Surgery)  OTHER: Melina Schools MD, Philis Fendt MD  CHIEF COMPLAINT: Newly diagnosed triple positive breast cancer  CURRENT TREATMENT: Herceptin; radiation  BREAST CANCER HISTORY: From the original intake note:  Kyung herself palpated a mass in her left breast. She brought it to the attention of [name] at the health department and she was set up for unilateral left mammography and ultrasound 11/27/2013 at the [facility]. This showed a possible distortion in the left breast upper outer quadrant. However the patient's breasts are density category D. On exam there was a firm nodule or mass in the left breast 2:00 location which by ultrasound was irregular and hypoechoic and measured 1.5 cm. There was no left axillary lymphadenopathy noted.  Biopsy of the mass in question 11/29/2013 showed ([id number]) an invasive ductal carcinoma, grade 1, estrogen receptor 94% positive, progesterone receptor 94% positive, both with strong staining intensity, with an MIB-1 of 46%, and HER-2 amplified by CISH, the signals ratio of 4.85 and the number per cell 6.55.  On 12/08/2013 the patient underwent bilateral breast MRI. This showed in the left breast upper outer quadrant a 2.9 cm irregular enhancing mass and extending posteriorly from this an area of clumped non-masslike enhancement extending approximately 6 cm and leading to a posterior group of nodules which were small but measured in aggregate 2.2 cm. In addition, there was a separate 0.8 cm  irregular spiculated enhancing mass in the upper inner aspect of the left breast. In the lower outer left breast there was a 0.8 cm oval enhancing mass. There were no abnormal appearing lymph nodes.  [name] subsequent history is as detailed below  INTERVAL HISTORY: Jessica [name] returns today for follow up of her breast cancer, accompanied by her significant other, Jessica. She has been on tamoxifen since June of this year. Initially she was having incredibly hot flashes, but 618m gabapentin QHS has significantly reduced these. She denies vaginal changes. The interval history is generally unremarkable. She is working 30 hours weekly with few issues.  REVIEW OF SYSTEMS: Carolyn's dose of venlafaxine was increased to 1577mwhich is helping her mood. She is still somewhat anxious due to recent deaths of friends with breast cancer. She continues to work with Dr. RaNelva Bushor her chronic back pain. She will be due for another steroid injection in January. She denies fevers, chills, nausea, vomiting, or changes in bowel or bladder habits. She has occasional shortness of breath, but denies chest pain, cough, or palpitations. A detailed review of systems today was otherwise stable   PAST MEDICAL HISTORY: Past Medical History  Diagnosis Date  . Anxiety   . COPD (chronic obstructive pulmonary disease) 2011    does not use inhaler ofter  . Depression   . Hot flashes   . Family history of anesthesia complication     Had a hard time waking father up only once after several procedures  . Shortness of breath     with exertion  . Chronic heartburn   . PVC (premature ventricular contraction)     PMH;at 49  .  . Asthma  denies history of asthma  . Heart murmur     PMH: at age 49; no longer have murmur  . 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 Cancer     DCIS    PAST SURGICAL HISTORY: Past Surgical History  Procedure Laterality Date  . Polyp removal     .  Tonsillectomy    . Wisdom tooth extraction    . Robotic assisted total hysterectomy with bilateral salpingo oopherectomy Bilateral 05/24/2013    Procedure: ROBOTIC ASSISTED TOTAL HYSTERECTOMY WITH BILATERAL SALPINGECTOMY;  Surgeon: Lavonia Drafts, MD;  Location: [facility];  Service: Gynecology;  Laterality: Bilateral;  . Cystoscopy N/A 05/24/2013    Procedure: CYSTOSCOPY;  Surgeon: Lavonia Drafts, MD;  Location: [facility];  Service: Gynecology;  Laterality: N/A;  . Abdominal hysterectomy    . Breast surgery      left breast biopsy x 2  . Portacath placement Right 01/02/2014    Procedure: INSERTION PORT-A-CATH;  Surgeon: [name], MD;  Location: [facility];  Service: General;  Laterality: Right;  . Radioactive seed guided mastectomy with axillary sentinel lymph node biopsy Left 05/29/2014    Procedure: RADIOACTIVE SEED GUIDED LEFT PARTIAL MASTECTOMY WITH AXILLARY SENTINEL LYMPH NODE BIOPSY;  Surgeon: [name], MD;  Location: [facility];  Service: General;  Laterality: Left;    FAMILY HISTORY Family History  Problem Relation Age of Onset  . Heart disease Mother   . Hypercholesterolemia Mother   . Heart disease Father   . COPD Father   . Hypercholesterolemia Father   . Cancer Neg Hx   [name] parents are still living, her father is 40 years old and her mother 48 years old as of August 2015. The patient had no brothers, one sister. There is no history of breast or ovarian cancer in the family.  GYNECOLOGIC HISTORY:  Patient's last menstrual period was 03/18/2013. Menarche age 49. She is GX P0. She took oral contraceptives for many years as well as Depo Provera shots. She is status post hysterectomy with bilateral salpingectomy. 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 She scans at work, and she also does all the shelving and stocking. She is single but for the last 11 years as lives with her  significant other Jessica [name]. He is disabled secondary to multiple orthopedic problems including bilateral avascular necrosis of the hips. He also had a R-body CVA January 2016. Both of them do smoke.     ADVANCED DIRECTIVES: Not in place    HEALTH MAINTENANCE: Social History  Substance Use Topics  . Smoking status: Current Every [name] Smoker -- 1.00 packs/[name] for 16 years    Types: Cigarettes  . Smokeless tobacco: Never Used     Comment: 1/2ppd decreased  . Alcohol Use: 0.0 oz/week    0 Standard drinks or equivalent per week     Comment: occasional      Colonoscopy:  PAP: January 2015   Bone density:  Lipid panel:  No Known Allergies  Current Outpatient Prescriptions  Medication Sig Dispense Refill  . acetaminophen (TYLENOL) 325 MG tablet Take 650 mg by mouth every 6 (six) hours as needed.    . budesonide-formoterol (SYMBICORT) 160-4.5 MCG/ACT inhaler Inhale 1 puff into the lungs 2 (two) times daily as needed (for shortness of breath).     . calcium carbonate (TUMS - DOSED IN MG ELEMENTAL CALCIUM) 500 MG chewable  tablet Chew 1 tablet by mouth daily.    . cyclobenzaprine (FLEXERIL) 5 MG tablet Take 1 tablet (5 mg total) by mouth 2 (two) times daily as needed for muscle spasms. 60 tablet 0  . eszopiclone (LUNESTA) 1 MG TABS tablet TAKE 1 TABLET BY MOUTH AT BEDTIME AS NEEDED FOR SLEEP. TAKE IMMEDIATELY BEFORE BEDTIME 30 tablet 0  . gabapentin (NEURONTIN) 300 MG capsule Take 1 capsule (300 mg total) by mouth 3 (three) times daily. 90 capsule 3  . omeprazole (PRILOSEC) 40 MG capsule TAKE 1 CAPSULE BY MOUTH ONCE DAILY 30 capsule 2  . tamoxifen (NOLVADEX) 20 MG tablet Take 20 mg by mouth daily.    Marland Kitchen venlafaxine XR (EFFEXOR-XR) 75 MG 24 hr capsule Take 1 capsule (75 mg total) by mouth daily with breakfast. (Patient taking differently: Take 75 mg by mouth daily with breakfast. Taking 2 capsules totaling 150 mg per [name]) 30 capsule 3  . zolpidem (AMBIEN) 5 MG tablet Take 1 tablet (5 mg total) by  mouth at bedtime as needed for sleep. 90 tablet 3  . ipratropium (ATROVENT HFA) 17 MCG/ACT inhaler Inhale 1 puff into the lungs every 6 (six) hours as needed for wheezing (SOB).     Marland Kitchen loratadine (CLARITIN) 10 MG tablet Take 1 tablet (10 mg total) by mouth as needed. (Patient not taking: Reported on 03/17/2015) 90 tablet 1  . oxyCODONE-acetaminophen (PERCOCET/ROXICET) 5-325 MG per tablet Take 1 tablet by mouth every 4 (four) hours as needed for severe pain. (Patient not taking: Reported on 03/17/2015) 40 tablet 0   No current facility-administered medications for this visit.    OBJECTIVE: middle-aged white woman in no acute distress  Filed Vitals:   03/17/15 1103  BP: 138/74  Pulse: 74  Temp: 98.2 F (36.8 C)  Resp: 18     Body mass index is 32.55 kg/(m^2).    ECOG FS:1 - Symptomatic but completely ambulatory  Skin: warm, dry  HEENT: sclerae anicteric, conjunctivae pink, oropharynx clear. No thrush or mucositis.  Lymph Nodes: No cervical or supraclavicular lymphadenopathy  Lungs: clear to auscultation bilaterally, no rales, wheezes, or rhonci  Heart: regular rate and rhythm  Abdomen: round, soft, non tender, positive bowel sounds  Musculoskeletal: No focal spinal tenderness, no peripheral edema  Neuro: non focal, well oriented, positive affect  Breast: left breast status post lumpectomy and radiation. No evidence of recurrent disease. Left axilla benign. Right breast unremarkable.  LAB RESULTS:  CMP     Component Value Date/Time   NA 141 03/17/2015 1036   NA 139 12/31/2013 1448   K 3.7 03/17/2015 1036   K 4.6 12/31/2013 1448   CL 103 12/31/2013 1448   CO2 27 03/17/2015 1036   CO2 24 12/31/2013 1448   GLUCOSE 104 03/17/2015 1036   GLUCOSE 93 12/31/2013 1448   BUN 8.7 03/17/2015 1036   BUN 9 12/31/2013 1448   CREATININE 0.9 03/17/2015 1036   CREATININE 0.83 12/31/2013 1448   CALCIUM 9.5 03/17/2015 1036   CALCIUM 8.8 12/31/2013 1448   PROT 6.7 03/17/2015 1036   PROT 7.1  06/04/2013 1511   ALBUMIN 3.8 03/17/2015 1036   ALBUMIN 3.4* 06/04/2013 1511   AST 16 03/17/2015 1036   AST 12 06/04/2013 1511   ALT 15 03/17/2015 1036   ALT 14 06/04/2013 1511   ALKPHOS 59 03/17/2015 1036   ALKPHOS 70 06/04/2013 1511   BILITOT 0.43 03/17/2015 1036   BILITOT 0.5 06/04/2013 1511   GFRNONAA 83* 12/31/2013 1448   GFRAA >  90 12/31/2013 1448    I No results found for: SPEP  Lab Results  Component Value Date   WBC 7.1 03/17/2015   NEUTROABS 5.3 03/17/2015   HGB 13.1 03/17/2015   HCT 38.8 03/17/2015   MCV 95.3 03/17/2015   PLT 180 03/17/2015      Chemistry      Component Value Date/Time   NA 141 03/17/2015 1036   NA 139 12/31/2013 1448   K 3.7 03/17/2015 1036   K 4.6 12/31/2013 1448   CL 103 12/31/2013 1448   CO2 27 03/17/2015 1036   CO2 24 12/31/2013 1448   BUN 8.7 03/17/2015 1036   BUN 9 12/31/2013 1448   CREATININE 0.9 03/17/2015 1036   CREATININE 0.83 12/31/2013 1448      Component Value Date/Time   CALCIUM 9.5 03/17/2015 1036   CALCIUM 8.8 12/31/2013 1448   ALKPHOS 59 03/17/2015 1036   ALKPHOS 70 06/04/2013 1511   AST 16 03/17/2015 1036   AST 12 06/04/2013 1511   ALT 15 03/17/2015 1036   ALT 14 06/04/2013 1511   BILITOT 0.43 03/17/2015 1036   BILITOT 0.5 06/04/2013 1511       No results found for: LABCA2  No components found for: LABCA125  No results for input(s): INR in the last 168 hours.  Urinalysis    Component Value Date/Time   COLORURINE YELLOW 06/04/2013 1420   APPEARANCEUR HAZY* 06/04/2013 1420   LABSPEC 1.030 01/27/2014 1350   LABSPEC >1.030* 06/04/2013 1420   PHURINE 5.0 01/27/2014 1350   PHURINE 6.0 06/04/2013 1420   GLUCOSEU Negative 01/27/2014 1350   GLUCOSEU NEGATIVE 06/04/2013 1420   HGBUR Negative 01/27/2014 1350   HGBUR MODERATE* 06/04/2013 1420   HGBUR negative 10/07/2009 1044   BILIRUBINUR Color Interference 01/27/2014 1350   BILIRUBINUR NEGATIVE 06/04/2013 1420   KETONESUR Color Interference 01/27/2014  1350   KETONESUR NEGATIVE 06/04/2013 1420   PROTEINUR Color Interference 01/27/2014 1350   PROTEINUR NEGATIVE 06/04/2013 1420   UROBILINOGEN Color Interference 01/27/2014 1350   UROBILINOGEN 0.2 06/04/2013 1420   NITRITE Color Interference 01/27/2014 1350   NITRITE NEGATIVE 06/04/2013 1420   LEUKOCYTESUR Color Interference 01/27/2014 1350   LEUKOCYTESUR NEGATIVE 06/04/2013 1420    STUDIES:  No results found.  ASSESSMENT: 49 y.o. BRCA negative [place] woman status post left breast upper outer quadrant biopsy 11/29/2013 for a clinical T2 N0, stage IIA invasive ductal carcinoma, grade 1, triple positive, with an MIB-1 of 46%.  (1) MRI-guided biopsy of two additional areas in the left breast showed only tubular adenomas, no malignancy.   (2) neoadjuvant chemotherapy started 01/14/2014, consisting of carboplatin, docetaxel, trastuzumab and pertuzumab given every 21 days for 6 cycles, with Neulasta on [name] 2. Completed 04/30/14  (3) trastuzumab to be continued to the total one year, last dose 01/06/2015  (a) most recent echo 10/06/2014 shows a well-preserved ejection fraction  (4) status post left lumpectomy with axillary sentinel lymph node biopsy on 05/29/2014 showing a residual ypT1c ypN0 invasive ductal carcinoma, grade 2, with negative margins   (5) adjuvant radiation completed 08/27/2014  (6) started tamoxifen 09/30/2014  (a) normal bone scan 08/28/2014  (7) abnormal radiotracer uptake in distal left femur evaluated with CT-guided biopsy 06/20/2014, showing enchondroma, with low cellularity, no mitotic figures, and no atypia.  (8) persistent low pelvic discomfort, with unremarkable transvaginal ultrasonography 12/12/2013 and CT of the abdomen and pelvis 08/29/2014  (9) status post remote total abdominal hysterectomy with bilateral salpingo-oophorectomy  PLAN: Sharyon is much improved since  her last visit. The labs were reviewed in detail and were entirely negative. She is  tolerating the tamoxifen well with the addition of gabapentin for her hot flashes. She will continue on it for at least 2 years before giving consideration to an aromatase inhibitor. She will continue on 124m venlafaxine as it has stabilized her mood.   LKhalilwill be due for a mammogram this December/January, so I have placed orders for this to be performed.   LPoonamwill return in 3 months for follow up. She understands and agrees with this plan. She knows the goal of treatment in her case is cure. She has been encouraged to call with any issues that might arise before her next visit here.    HLaurie Panda NP   03/17/2015 12:04 PM

## 2015-03-17 NOTE — Telephone Encounter (Signed)
Gave patient avs report and appointments for February 2017 and mammo for December 2016.

## 2015-03-17 NOTE — Patient Instructions (Signed)

## 2015-03-24 ENCOUNTER — Other Ambulatory Visit: Payer: Self-pay | Admitting: Nurse Practitioner

## 2015-03-24 ENCOUNTER — Ambulatory Visit
Admission: RE | Admit: 2015-03-24 | Discharge: 2015-03-24 | Disposition: A | Discharge status: Home / Self Care | Payer: Medicaid Other | Source: Ambulatory Visit | Attending: Nurse Practitioner | Admitting: Nurse Practitioner

## 2015-03-24 DIAGNOSIS — C50412 Malignant neoplasm of upper-outer quadrant of left female breast: Secondary | ICD-10-CM

## 2015-03-30 ENCOUNTER — Telehealth: Payer: Self-pay | Admitting: *Deleted

## 2015-03-30 NOTE — Telephone Encounter (Signed)
Patient called and left message.  She is to have her PAC removed this Thursday 04/02/15.  She had a mammogram Tuesday and it showed a "suspicious area"  This was followed by a ultrasound.  "Dr. Glennon Mac said it was probably benign."  Patient would like Dr. Jana Hakim to look at mammogram and ultrasound.  Should she go ahead with having PAC removed?  Her call back is [phone].

## 2015-03-31 ENCOUNTER — Other Ambulatory Visit: Payer: Self-pay | Admitting: Oncology

## 2015-03-31 NOTE — Telephone Encounter (Signed)
Reviewed [place]-- OK to remove PAC

## 2015-03-31 NOTE — Telephone Encounter (Signed)
Writer called patient to let her know that it is OK per Dr. Jana Hakim to have her PORT removed on 04/02/15.  Patient happily stated understanding.

## 2015-06-15 ENCOUNTER — Other Ambulatory Visit: Payer: Self-pay | Admitting: *Deleted

## 2015-06-15 DIAGNOSIS — C50412 Malignant neoplasm of upper-outer quadrant of left female breast: Secondary | ICD-10-CM

## 2015-06-16 ENCOUNTER — Telehealth: Payer: Self-pay | Admitting: Oncology

## 2015-06-16 ENCOUNTER — Other Ambulatory Visit (HOSPITAL_BASED_OUTPATIENT_CLINIC_OR_DEPARTMENT_OTHER): Payer: Medicaid Other

## 2015-06-16 ENCOUNTER — Ambulatory Visit (HOSPITAL_BASED_OUTPATIENT_CLINIC_OR_DEPARTMENT_OTHER): Payer: Medicaid Other | Admitting: Oncology

## 2015-06-16 VITALS — BP 114/74 | HR 96 | Temp 98.4°F | Resp 18 | Ht 65.0 in | Wt 197.3 lb

## 2015-06-16 DIAGNOSIS — C50412 Malignant neoplasm of upper-outer quadrant of left female breast: Secondary | ICD-10-CM

## 2015-06-16 LAB — COMPREHENSIVE METABOLIC PANEL
ALT: 28 U/L (ref 0–55)
AST: 22 U/L (ref 5–34)
Albumin: 4 g/dL (ref 3.5–5.0)
Alkaline Phosphatase: 79 U/L (ref 40–150)
Anion Gap: 10 mEq/L (ref 3–11)
BUN: 11.7 mg/dL (ref 7.0–26.0)
CHLORIDE: 105 meq/L (ref 98–109)
CO2: 26 meq/L (ref 22–29)
CREATININE: 0.9 mg/dL (ref 0.6–1.1)
Calcium: 9.4 mg/dL (ref 8.4–10.4)
EGFR: 80 mL/min/{1.73_m2} — ABNORMAL LOW (ref >90)
Glucose: 93 mg/dl (ref 70–140)
Potassium: 4 mEq/L (ref 3.5–5.1)
Sodium: 141 mEq/L (ref 136–145)
Total Bilirubin: 0.49 mg/dL (ref 0.20–1.20)
Total Protein: 7.1 g/dL (ref 6.4–8.3)

## 2015-06-16 LAB — CBC WITH DIFFERENTIAL/PLATELET
BASO%: 0.4 % (ref 0.0–2.0)
Basophils Absolute: 0 10*3/uL (ref 0.0–0.1)
EOS%: 4.4 % (ref 0.0–7.0)
Eosinophils Absolute: 0.3 10*3/uL (ref 0.0–0.5)
HCT: 40.9 % (ref 34.8–46.6)
HGB: 14 g/dL (ref 11.6–15.9)
LYMPH#: 1.1 10*3/uL (ref 0.9–3.3)
LYMPH%: 15.5 % (ref 14.0–49.7)
MCH: 32 pg (ref 25.1–34.0)
MCHC: 34.3 g/dL (ref 31.5–36.0)
MCV: 93.4 fL (ref 79.5–101.0)
MONO#: 0.3 10*3/uL (ref 0.1–0.9)
MONO%: 4.7 % (ref 0.0–14.0)
NEUT#: 5.4 10*3/uL (ref 1.5–6.5)
NEUT%: 75 % (ref 38.4–76.8)
Platelets: 122 10*3/uL — ABNORMAL LOW (ref 145–400)
RBC: 4.38 10*6/uL (ref 3.70–5.45)
RDW: 14.3 % (ref 11.2–14.5)
WBC: 7.1 10*3/uL (ref 3.9–10.3)

## 2015-06-16 NOTE — Progress Notes (Signed)
[place]  [phone] [phone]    ID: ANBERLIN DIEZ DOB: [birth year]  MR#: [id number]  PVX#:[id number]  Patient Care Team: Chevis Pretty, MD as PCP - General (Family Medicine) [name], MD as Consulting Physician (General Surgery) [name], MD as Consulting Physician (Oncology) Kyung Rudd, MD as Consulting Physician (Radiation Oncology) Melina Schools, MD as Consulting Physician (Orthopedic Surgery)  OTHER: Melina Schools MD, Philis Fendt MD  CHIEF COMPLAINT: Newly diagnosed triple positive breast cancer  CURRENT TREATMENT: Tamoxifen  BREAST CANCER HISTORY: From the original intake note:  Jessica [name] herself palpated a mass in her left breast. She brought it to the attention of [name] at the health department and she was set up for unilateral left mammography and ultrasound 11/27/2013 at the breast Center. This showed a possible distortion in the left breast upper outer quadrant. However the patient's breasts are density category D. On exam there was a firm nodule or mass in the left breast 2:00 location which by ultrasound was irregular and hypoechoic and measured 1.5 cm. There was no left axillary lymphadenopathy noted.  Biopsy of the mass in question 11/29/2013 showed ([id number]) an invasive ductal carcinoma, grade 1, estrogen receptor 94% positive, progesterone receptor 94% positive, both with strong staining intensity, with an MIB-1 of 46%, and HER-2 amplified by CISH, the signals ratio of 4.85 and the number per cell 6.55.  On 12/08/2013 the patient underwent bilateral breast MRI. This showed in the left breast upper outer quadrant a 2.9 cm irregular enhancing mass and extending posteriorly from this an area of clumped non-masslike enhancement extending approximately 6 cm and leading to a posterior group of nodules which were small but measured in aggregate 2.2 cm. In addition, there was a separate 0.8 cm irregular  spiculated enhancing mass in the upper inner aspect of the left breast. In the lower outer left breast there was a 0.8 cm oval enhancing mass. There were no abnormal appearing lymph nodes.  [name] subsequent history is as detailed below  INTERVAL HISTORY: Jessica [name] returns today for follow up of her estrogen receptor positive breast cancer. She continues on tamoxifen, with generally good tolerance. Hot flashes and vaginal wetness are not major issues. She obtains a drug at a good price.  REVIEW OF SYSTEMS: Jessica [name] tells me she just left her job. It was too stressful. It was incredibly long hours, constant stress and work, and no breaks for weakens frequently. 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 Aside from that issue, which is causing financial stress, a detailed review of systems today was noncontributory  PAST MEDICAL HISTORY: Past Medical History  Diagnosis Date  . Anxiety   . COPD (chronic obstructive pulmonary disease) ([place]) 2011    does not use inhaler ofter  . Depression   . Hot flashes   . Family history of anesthesia complication     Had a hard time waking father up only once after several procedures  . Shortness of breath     with exertion  . Chronic heartburn   . PVC (premature ventricular contraction)     PMH;at 50 y.o.  . Asthma     denies history of asthma  . Heart murmur     PMH: at age 52; no longer have murmur  . Breast cancer ([place]) 05/29/14    left breast Lumpectomy=Invasive ductal Carcinoma  . 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  Polyp removal     . Tonsillectomy    . Wisdom tooth extraction    . Robotic assisted total hysterectomy with bilateral salpingo oopherectomy Bilateral 05/24/2013    Procedure: ROBOTIC ASSISTED TOTAL HYSTERECTOMY WITH BILATERAL SALPINGECTOMY;  Surgeon: Lavonia Drafts, MD;  Location: [facility];  Service: Gynecology;  Laterality: Bilateral;  . Cystoscopy N/A 05/24/2013      Procedure: CYSTOSCOPY;  Surgeon: Lavonia Drafts, MD;  Location: [facility];  Service: Gynecology;  Laterality: N/A;  . Abdominal hysterectomy    . Breast surgery      left breast biopsy x 2  . Portacath placement Right 01/02/2014    Procedure: INSERTION PORT-A-CATH;  Surgeon: [name], MD;  Location: [place];  Service: General;  Laterality: Right;  . Radioactive seed guided mastectomy with axillary sentinel lymph node biopsy Left 05/29/2014    Procedure: RADIOACTIVE SEED GUIDED LEFT PARTIAL MASTECTOMY WITH AXILLARY SENTINEL LYMPH NODE BIOPSY;  Surgeon: [name], MD;  Location: [place];  Service: General;  Laterality: Left;    FAMILY HISTORY Family History  Problem Relation Age of Onset  . Heart disease Mother   . Hypercholesterolemia Mother   . Heart disease Father   . COPD Father   . Hypercholesterolemia Father   . Cancer Neg Hx   [name] parents are still living, her father is 41 years old and her mother 36 years old as of August 2015. The patient had no brothers, one sister. There is no history of breast or ovarian cancer in the family.  GYNECOLOGIC HISTORY:  Patient's last menstrual period was 03/18/2013. Menarche age 36. She is GX P0. She took oral contraceptives for many years as well as Depo Provera shots. She is status post hysterectomy with bilateral salpingectomy. 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 She scans at work, and she also does all the shelving and stocking. She is single but for the last 11 years as lives with her significant other [name]. He is disabled secondary to multiple orthopedic problems including bilateral avascular necrosis of the hips. He also had a R-body CVA January 2016. Both of them do smoke.     ADVANCED DIRECTIVES: Not in place    HEALTH MAINTENANCE: Social History  Substance Use Topics  . Smoking status: Current Every Day Smoker -- 1.00  packs/day for 16 years    Types: Cigarettes  . Smokeless tobacco: Never Used     Comment: 1/2ppd decreased  . Alcohol Use: 0.0 oz/week    0 Standard drinks or equivalent per week     Comment: occasional      Colonoscopy:  PAP: January 2015   Bone density:  Lipid panel:  No Known Allergies  Current Outpatient Prescriptions  Medication Sig Dispense Refill  . acetaminophen (TYLENOL) 325 MG tablet Take 650 mg by mouth every 6 (six) hours as needed.    . budesonide-formoterol (SYMBICORT) 160-4.5 MCG/ACT inhaler Inhale 1 puff into the lungs 2 (two) times daily as needed (for shortness of breath).     . calcium carbonate (TUMS - DOSED IN MG ELEMENTAL CALCIUM) 500 MG chewable tablet Chew 1 tablet by mouth daily.    . cyclobenzaprine (FLEXERIL) 5 MG tablet Take 1 tablet (5 mg total) by mouth 2 (two) times daily as needed for muscle spasms. 60 tablet 0  . eszopiclone (LUNESTA) 1 MG TABS tablet TAKE 1 TABLET BY MOUTH AT BEDTIME AS NEEDED FOR SLEEP.  TAKE IMMEDIATELY BEFORE BEDTIME 30 tablet 0  . gabapentin (NEURONTIN) 300 MG capsule Take 1 capsule (300 mg total) by mouth 3 (three) times daily. 90 capsule 3  . ipratropium (ATROVENT HFA) 17 MCG/ACT inhaler Inhale 1 puff into the lungs every 6 (six) hours as needed for wheezing (SOB).     Marland Kitchen loratadine (CLARITIN) 10 MG tablet Take 1 tablet (10 mg total) by mouth as needed. (Patient not taking: Reported on 03/17/2015) 90 tablet 1  . omeprazole (PRILOSEC) 40 MG capsule TAKE 1 CAPSULE BY MOUTH ONCE DAILY 30 capsule 2  . oxyCODONE-acetaminophen (PERCOCET/ROXICET) 5-325 MG per tablet Take 1 tablet by mouth every 4 (four) hours as needed for severe pain. (Patient not taking: Reported on 03/17/2015) 40 tablet 0  . tamoxifen (NOLVADEX) 20 MG tablet Take 1 tablet (20 mg total) by mouth daily. 90 tablet 2  . venlafaxine XR (EFFEXOR-XR) 75 MG 24 hr capsule Take 2 capsules (150 mg total) by mouth daily with breakfast. 60 capsule 3  . zolpidem (AMBIEN) 5 MG tablet  Take 1 tablet (5 mg total) by mouth at bedtime as needed for sleep. 90 tablet 3   No current facility-administered medications for this visit.    OBJECTIVE: middle-aged white woman who appears well Filed Vitals:   06/16/15 1538  BP: 114/74  Pulse: 96  Temp: 98.4 F (36.9 C)  Resp: 18     Body mass index is 32.83 kg/(m^2).    ECOG FS:0 - Asymptomatic  Sclerae unicteric, pupils round and equal Oropharynx clear and moist-- no thrush or other lesions No cervical or supraclavicular adenopathy Lungs no rales or rhonchi Heart regular rate and rhythm Abd soft, nontender, positive bowel sounds MSK no focal spinal tenderness, no upper extremity lymphedema Neuro: nonfocal, well oriented, appropriate affect Breasts: The right breast is unremarkable. The left breast is status post lumpectomy and radiation. There is no evidence of local recurrence. The left axilla is benign.    LAB RESULTS:  CMP     Component Value Date/Time   NA 141 06/16/2015 1433   NA 139 12/31/2013 1448   K 4.0 06/16/2015 1433   K 4.6 12/31/2013 1448   CL 103 12/31/2013 1448   CO2 26 06/16/2015 1433   CO2 24 12/31/2013 1448   GLUCOSE 93 06/16/2015 1433   GLUCOSE 93 12/31/2013 1448   BUN 11.7 06/16/2015 1433   BUN 9 12/31/2013 1448   CREATININE 0.9 06/16/2015 1433   CREATININE 0.83 12/31/2013 1448   CALCIUM 9.4 06/16/2015 1433   CALCIUM 8.8 12/31/2013 1448   PROT 7.1 06/16/2015 1433   PROT 7.1 06/04/2013 1511   ALBUMIN 4.0 06/16/2015 1433   ALBUMIN 3.4* 06/04/2013 1511   AST 22 06/16/2015 1433   AST 12 06/04/2013 1511   ALT 28 06/16/2015 1433   ALT 14 06/04/2013 1511   ALKPHOS 79 06/16/2015 1433   ALKPHOS 70 06/04/2013 1511   BILITOT 0.49 06/16/2015 1433   BILITOT 0.5 06/04/2013 1511   GFRNONAA 83* 12/31/2013 1448   GFRAA >90 12/31/2013 1448    I No results found for: SPEP  Lab Results  Component Value Date   WBC 7.1 06/16/2015   NEUTROABS 5.4 06/16/2015   HGB 14.0 06/16/2015   HCT 40.9  06/16/2015   MCV 93.4 06/16/2015   PLT 122* 06/16/2015      Chemistry      Component Value Date/Time   NA 141 06/16/2015 1433   NA 139 12/31/2013 1448   K 4.0 06/16/2015 1433  K 4.6 12/31/2013 1448   CL 103 12/31/2013 1448   CO2 26 06/16/2015 1433   CO2 24 12/31/2013 1448   BUN 11.7 06/16/2015 1433   BUN 9 12/31/2013 1448   CREATININE 0.9 06/16/2015 1433   CREATININE 0.83 12/31/2013 1448      Component Value Date/Time   CALCIUM 9.4 06/16/2015 1433   CALCIUM 8.8 12/31/2013 1448   ALKPHOS 79 06/16/2015 1433   ALKPHOS 70 06/04/2013 1511   AST 22 06/16/2015 1433   AST 12 06/04/2013 1511   ALT 28 06/16/2015 1433   ALT 14 06/04/2013 1511   BILITOT 0.49 06/16/2015 1433   BILITOT 0.5 06/04/2013 1511       No results found for: LABCA2  No components found for: LABCA125  No results for input(s): INR in the last 168 hours.  Urinalysis    Component Value Date/Time   COLORURINE YELLOW 06/04/2013 1420   APPEARANCEUR HAZY* 06/04/2013 1420   LABSPEC 1.030 01/27/2014 1350   LABSPEC >1.030* 06/04/2013 1420   PHURINE 5.0 01/27/2014 1350   PHURINE 6.0 06/04/2013 1420   GLUCOSEU Negative 01/27/2014 1350   GLUCOSEU NEGATIVE 06/04/2013 1420   HGBUR Negative 01/27/2014 1350   HGBUR MODERATE* 06/04/2013 1420   HGBUR negative 10/07/2009 1044   BILIRUBINUR Color Interference 01/27/2014 1350   BILIRUBINUR NEGATIVE 06/04/2013 1420   KETONESUR Color Interference 01/27/2014 1350   KETONESUR NEGATIVE 06/04/2013 1420   PROTEINUR Color Interference 01/27/2014 1350   PROTEINUR NEGATIVE 06/04/2013 1420   UROBILINOGEN Color Interference 01/27/2014 1350   UROBILINOGEN 0.2 06/04/2013 1420   NITRITE Color Interference 01/27/2014 1350   NITRITE NEGATIVE 06/04/2013 1420   LEUKOCYTESUR Color Interference 01/27/2014 1350   LEUKOCYTESUR NEGATIVE 06/04/2013 1420    STUDIES:  No results found.  ASSESSMENT: 50 y.o. BRCA negative [place] woman status post left breast upper outer quadrant  biopsy 11/29/2013 for a clinical T2 N0, stage IIA invasive ductal carcinoma, grade 1, triple positive, with an MIB-1 of 46%.  (1) MRI-guided biopsy of two additional areas in the left breast showed only tubular adenomas, no malignancy.   (2) neoadjuvant chemotherapy started 01/14/2014, consisting of carboplatin, docetaxel, trastuzumab and pertuzumab given every 21 days for 6 cycles, with Neulasta on day 2. Completed 04/30/14  (3) trastuzumab continued to total one year, last dose 01/06/2015  (a) most recent echo 10/06/2014 shows a well-preserved ejection fraction  (4) status post left lumpectomy with axillary sentinel lymph node biopsy on 05/29/2014 showing a residual ypT1c ypN0 invasive ductal carcinoma, grade 2, with negative margins   (5) adjuvant radiation completed 08/27/2014  (6) started tamoxifen 09/30/2014  (a) normal bone scan 08/28/2014  (7) abnormal radiotracer uptake in distal left femur evaluated with CT-guided biopsy 06/20/2014, showing enchondroma, with low cellularity, no mitotic figures, and no atypia.  (8) persistent low pelvic discomfort, with unremarkable transvaginal ultrasonography 12/12/2013 and CT of the abdomen and pelvis 08/29/2014  (9) status post remote total abdominal hysterectomy with bilateral salpingo-oophorectomy  PLAN: Jessica [name] is doing fine as far as breast cancer is concerned, and the plan is to continue tamoxifen for a minimum of 5 years, possibly 10.  Unfortunately she now has no job. 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 I suggested she contact "home instead" as a possible employer. I do not know what their qualifications are.  I also suggested she participate in the [place] program. Not only will this contributed to her general well-being,  but she will meet a new group of people and she might be able to find a job through those contacts.  She will see me again in 6 months.  She knows to call for any problems that may develop before that visit.   [name], MD   06/16/2015 4:04 PM

## 2015-06-16 NOTE — Telephone Encounter (Signed)
Gave patient avs report and appointments for August.  °

## 2015-06-18 MED ORDER — TAMOXIFEN CITRATE 20 MG PO TABS: 20.0000 mg | ORAL_TABLET | Freq: Every day | ORAL | Status: DC

## 2015-07-08 ENCOUNTER — Telehealth: Payer: Self-pay | Admitting: *Deleted

## 2015-07-08 NOTE — Telephone Encounter (Signed)
FYI "I have a question about where to obtain testing for MRSA.  I visited my Dad in the hospital two days ago.  I bathed, washed his hair and lotioned his body.  The family was just notified he has MRSA.  So far, I do not have any symptoms or changes."     Will notify providers and advised she contact PCP.

## 2015-08-17 ENCOUNTER — Other Ambulatory Visit: Payer: Self-pay | Admitting: Oncology

## 2015-08-17 ENCOUNTER — Other Ambulatory Visit: Payer: Self-pay | Admitting: Nurse Practitioner

## 2015-08-17 DIAGNOSIS — R922 Inconclusive mammogram: Secondary | ICD-10-CM

## 2015-08-17 DIAGNOSIS — N63 Unspecified lump in unspecified breast: Secondary | ICD-10-CM

## 2015-08-19 ENCOUNTER — Other Ambulatory Visit: Payer: Self-pay | Admitting: Oncology

## 2015-08-24 ENCOUNTER — Telehealth: Payer: Self-pay

## 2015-08-24 NOTE — Telephone Encounter (Signed)
Patient calling today requesting an appt with MD or NP to check an underarm issue.  Msg sent to RN phone.

## 2015-09-12 IMAGING — CT CT ABD-PELV W/ CM
1 of 3 series · 14 of 32 positions shown, 19 images · IV contrast (OMNIPAQUE)
Comparison: None.

CLINICAL DATA: Status post hysterectomy 05/24/2013 with lower
abdominal pain.

EXAM:
CT ABDOMEN AND PELVIS WITH CONTRAST
TECHNIQUE: Multidetector CT imaging of the abdomen and pelvis was performed
using the standard protocol following bolus administration of
intravenous contrast.
CONTRAST:  100mL OMNIPAQUE IOHEXOL 300 MG/ML  SOLN

[Series 2: routine abdomen/pelvis with · axial · 0.79mm/px · z∈[-373,+27]mm · 14 of 92 slices shown, 19 images]
[im 6/92  soft-tissue]
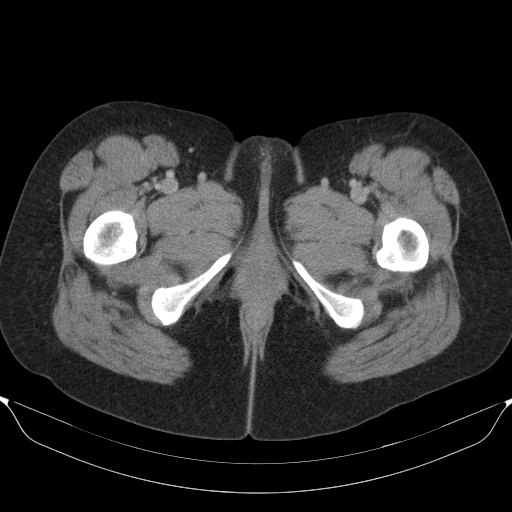
[im 6/92  bone]
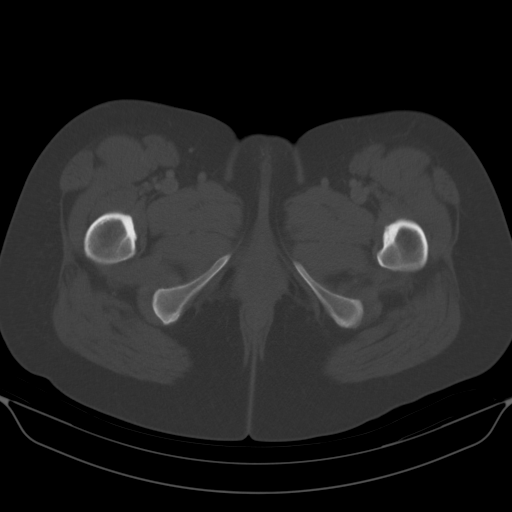
[im 12/92  soft-tissue]
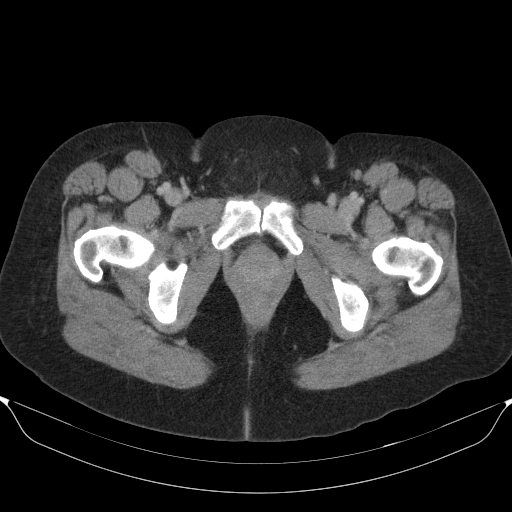
[im 18/92  soft-tissue]
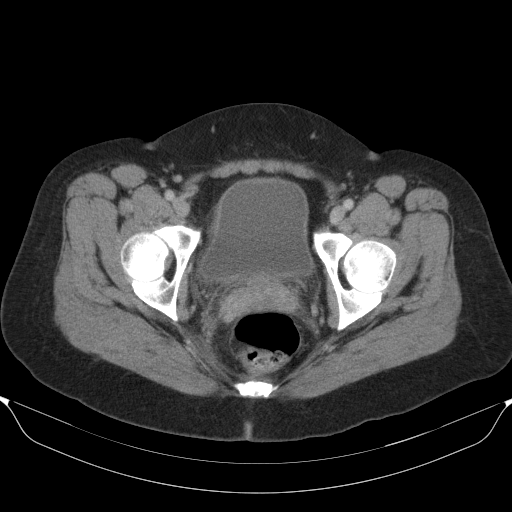
[im 29/92  soft-tissue]
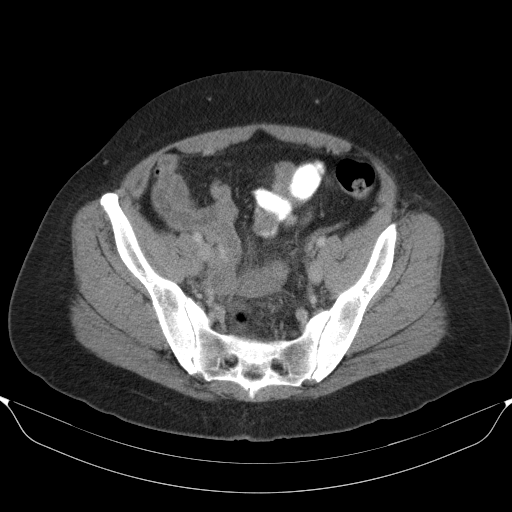
[im 35/92  soft-tissue]
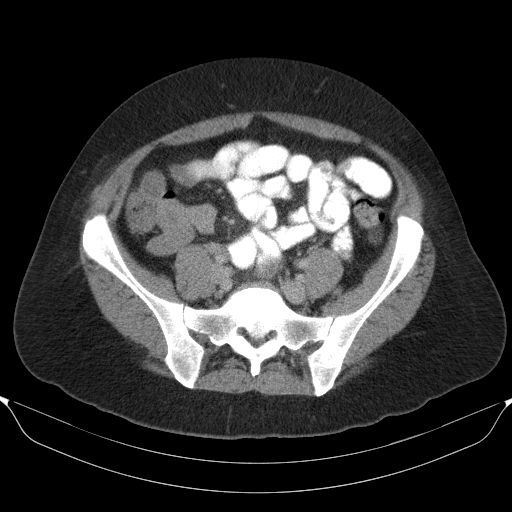
[im 40/92  soft-tissue]
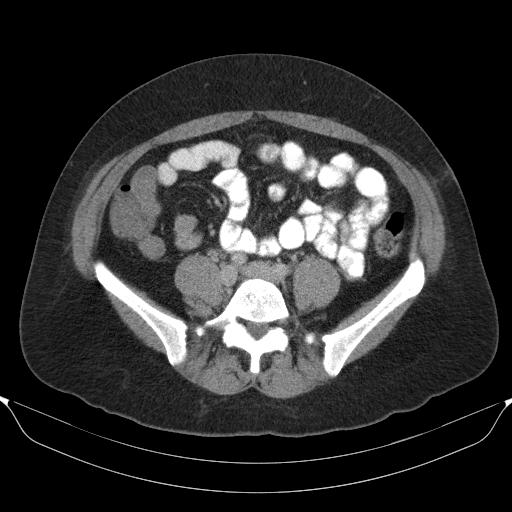
[im 46/92  soft-tissue]
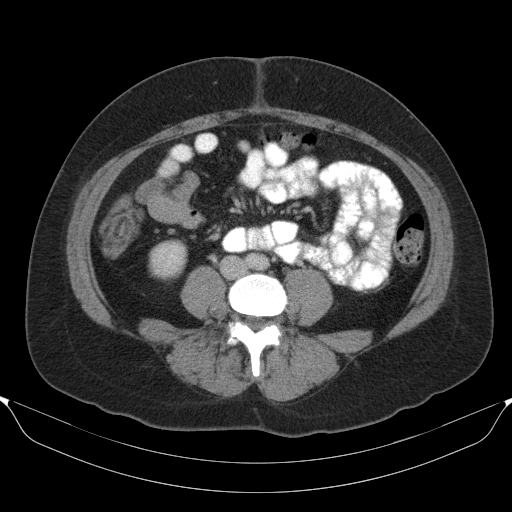
[im 52/92  soft-tissue]
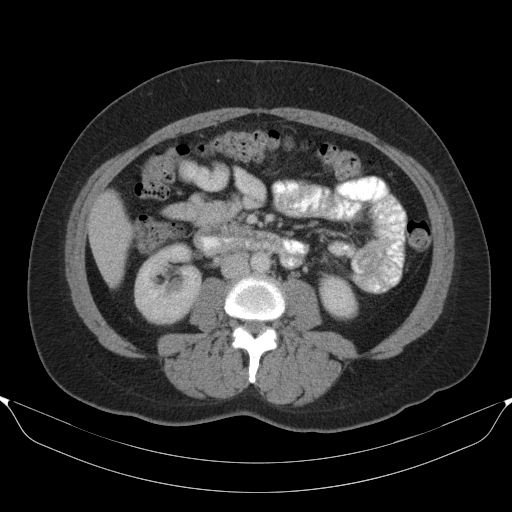
[im 57/92  soft-tissue]
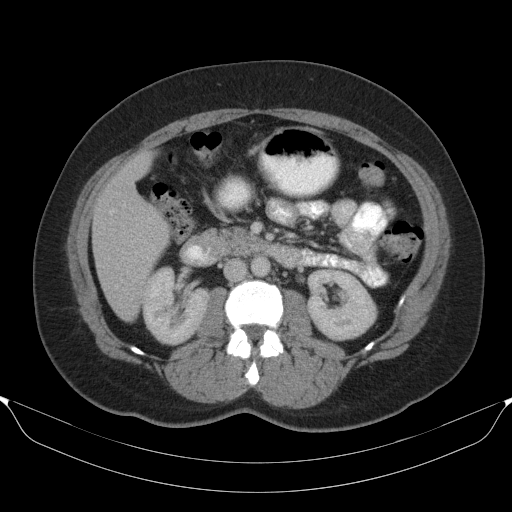
[im 57/92  bone]
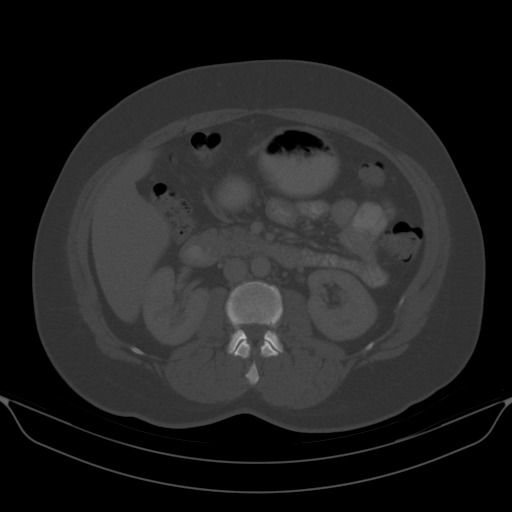
[im 63/92  soft-tissue]
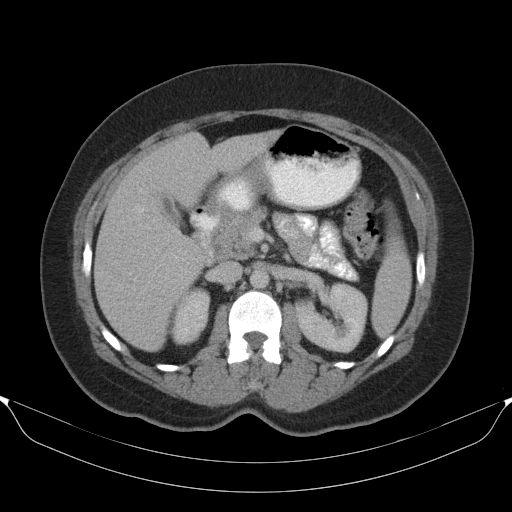
[im 69/92  lung]
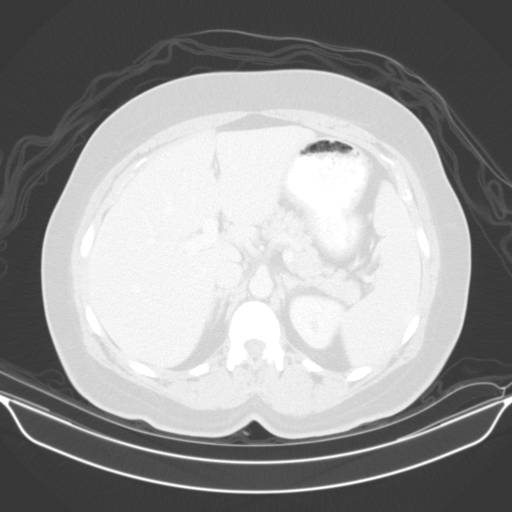
[im 74/92  soft-tissue]
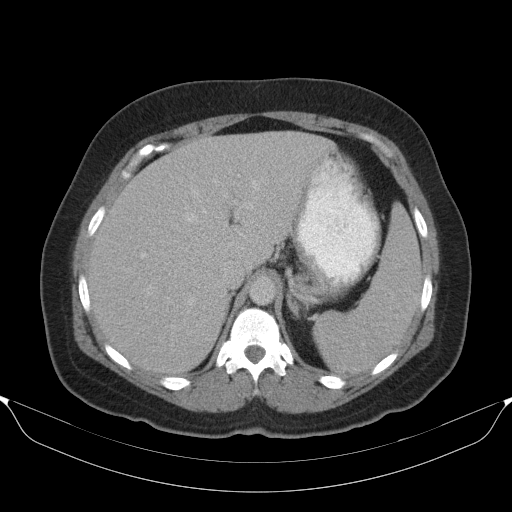
[im 74/92  lung]
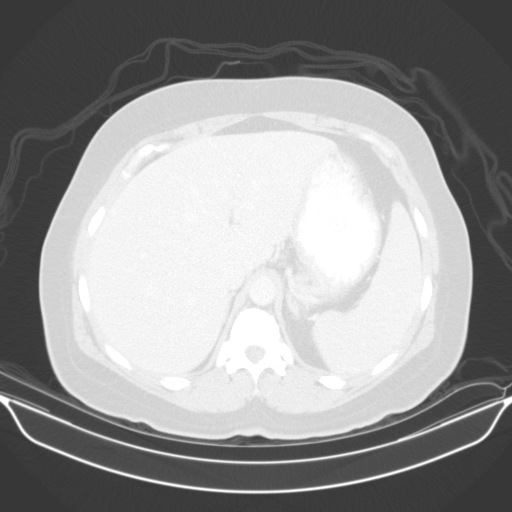
[im 80/92  soft-tissue]
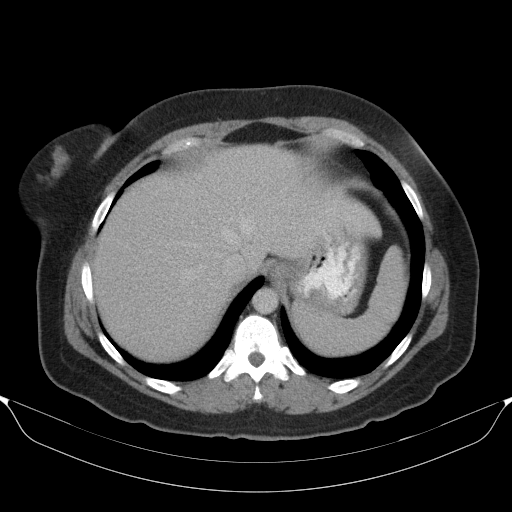
[im 80/92  lung]
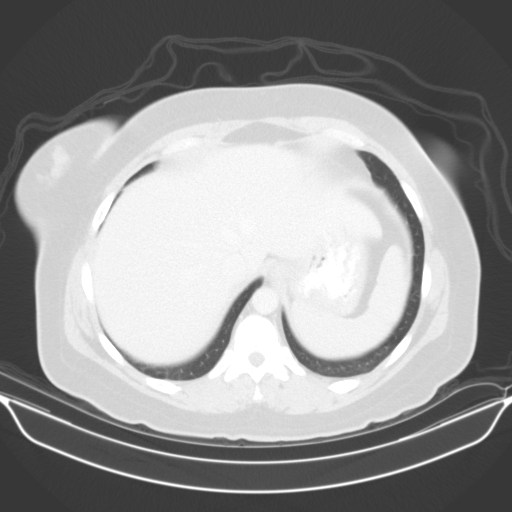
[im 86/92  soft-tissue]
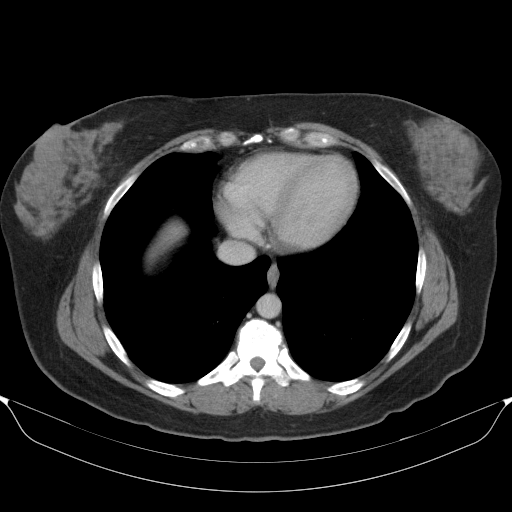
[im 86/92  lung]
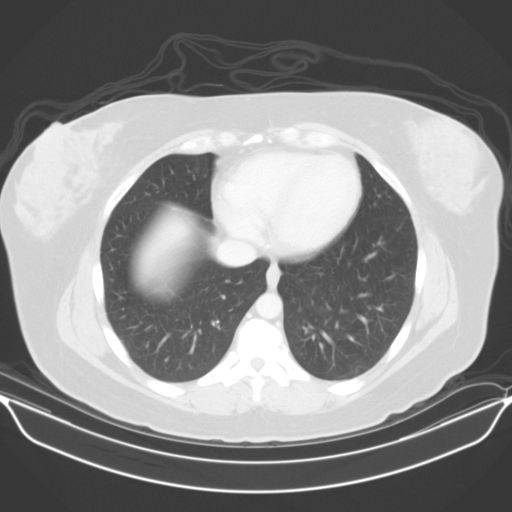

[14 of 32 positions shown; findings below may reference images not displayed]

FINDINGS: The lung bases are clear. No pleural effusion. No pericardial
effusion.

The liver is unremarkable. No focal hepatic lesions or intrahepatic
biliary dilatation. The gallbladder is contracted. No common bile
duct dilatation. The pancreas appears normal. Mild splenic
enlargement is noted. The spleen measures 14.6 x 11.0 x 8.6 cm. No
all splenic lesions. The adrenal glands and kidneys are normal.

The stomach, duodenum, small bowel and colon are grossly normal. No
findings for small bowel obstruction. No mesenteric or
retroperitoneal mass or adenopathy. Small scattered lymph nodes are
noted. The aorta and branch vessels are normal. The major venous
structures are patent.

There is an inflammatory process in the right lower pelvis. The
right ovary appears inflamed along with an adjacent small bowel
loop. There is interstitial change in the fat. The appendix is
identified and appears normal. No findings to suggest appendicitis.
There is a small fluid collection and a small dot of air in the
right pelvis on image number 71. This could be a small developing
abscess. I do not see any drainable abscess formation. The bladder
appears normal. The left ovary is normal.

The bony structures are unremarkable.
IMPRESSION: 1. Postoperative changes in the pelvis from a recent hysterectomy.
Inflammatory changes in the right lower pelvis are noted mainly
surrounding the right ovary and adjacent small bowel loops. Suspect
a small (21 x 18 mm) developing abscess on image number 71 but no
discrete drainable abscess is identified.
2. Normal left ovary.  Normal appendix.
3. The abdomen is unremarkable except for mild splenomegaly.

## 2015-09-24 ENCOUNTER — Ambulatory Visit
Admission: RE | Admit: 2015-09-24 | Discharge: 2015-09-24 | Disposition: A | Discharge status: Home / Self Care | Payer: Medicaid Other | Source: Ambulatory Visit | Attending: Oncology | Admitting: Oncology

## 2015-09-24 DIAGNOSIS — N63 Unspecified lump in unspecified breast: Secondary | ICD-10-CM

## 2015-10-29 ENCOUNTER — Telehealth: Payer: Self-pay | Admitting: *Deleted

## 2015-10-29 NOTE — Telephone Encounter (Signed)
Patient called and left message regarding bills/medicad. I have forward the message to both the desk RN and Raquel

## 2015-10-30 ENCOUNTER — Telehealth (HOSPITAL_COMMUNITY): Payer: Self-pay | Admitting: *Deleted

## 2015-10-30 NOTE — Telephone Encounter (Signed)
Received a message this morning that patient has received a bill from August 2016 chemo treatment. Called patient back and explained [facility] Medicaid. Patient has received Medicaid through the [facility] program starting 11/16/2013 and since patient has been in active treatment there should be no lapse in her coverage. Asked patient if she had a deductible. Patient stated she was not aware of one. Told patient she will need to follow up with DSS by calling the number on the back of her card. Also, encouraged her to follow up with one of the financial counselors on the [place] program. Let her know that this year's Medicaid renewal has been sent to DSS and is pending approval. Told patient if she has any additional questions or if there is anything we can do to help to call. Gave patient my direct phone number. Patient verbalized understanding.

## 2015-11-02 ENCOUNTER — Encounter: Payer: Self-pay | Admitting: Obstetrics & Gynecology

## 2015-11-02 ENCOUNTER — Ambulatory Visit (INDEPENDENT_AMBULATORY_CARE_PROVIDER_SITE_OTHER): Payer: Medicaid Other | Admitting: Obstetrics & Gynecology

## 2015-11-02 VITALS — BP 122/83 | HR 78 | Ht 65.0 in | Wt 194.0 lb

## 2015-11-02 DIAGNOSIS — R102 Pelvic and perineal pain: Secondary | ICD-10-CM

## 2015-11-02 NOTE — Patient Instructions (Signed)

## 2015-11-02 NOTE — Progress Notes (Signed)
Patient ID: Jessica [name], female   DOB: 02-Oct-1965, 50 y.o.   MRN: [id number] Subjective:     Jessica [name] is a 50 y.o. female here for a routine exam.  Current complaints: pt c/o pain in left side of pelvis. Pt reports that pain has been present for > 1 year.  Pt is s/p colonoscopy 8 months prev which was normal. She reports that the pain is constant and not relieved with  Pt being followed for breast CA dx'd in 2015. S/p chemo and radiation.  Pt is interested in removal of ovaries given her dx of breast CA.   Gynecologic History Patient's last menstrual period was 03/18/2013. Contraception: status post hysterectomy Last Pap: 09/2009. Results were: normal. Pt s/p RATH with [facility] in 2015 Last mammogram: 09/2015. Results were: abnormal. Pt begin followed by the breast center.  For a repeat mammogram in 03/2016  Obstetric History OB History  Gravida Para Term Preterm AB SAB TAB Ectopic Multiple Living  0                The following portions of the patient's history were reviewed and updated as appropriate: allergies, current medications, past family history, past medical history, past social history, past surgical history and problem list.  Review of Systems Pertinent items are noted in HPI.    Objective:  BP 122/83 mmHg  Pulse 78  Ht 5\' 5"  (1.651 m)  Wt 194 lb (87.998 kg)  BMI 32.28 kg/m2  LMP 03/18/2013 Gen: Pt in NAD Lungs: CTA CV: RRR Abd: soft, NT, ND GU:  EGBUS: no lesions  Vagina: no blood in vault; cuff well healed  Cervix and uterus surgically absent  Adnexa: no masses; non tender    Assessment:    Healthy female exam.   H/o breast CA desires oophorectomy Pelvic pain >6 months    Plan:   Pelvic sono  Patient desires surgical management with exploratory laparoscopy with removal of ovaries bilaterally.  The risks of surgery were discussed in detail with the patient including but not limited to: bleeding which may require transfusion or reoperation; infection  which may require prolonged hospitalization or re-hospitalization and antibiotic therapy; injury to bowel, bladder, ureters and major vessels or other surrounding organs; need for additional procedures including laparotomy; thromboembolic phenomenon, incisional problems and other postoperative or anesthesia complications.  Patient was told that the likelihood that her condition and symptoms will be treated effectively with this surgical management was very high; the postoperative expectations were also discussed in detail. The patient also understands the alternative treatment options which were discussed in full. All questions were answered.  She was told that she will be contacted by our surgical scheduler regarding the time and date of her surgery; routine preoperative instructions of having nothing to eat or drink after midnight on the day prior to surgery and also coming to the hospital 1 1/2 hours prior to her time of surgery were also emphasized.  She was told she may be called for a preoperative appointment about a week prior to surgery and will be given further preoperative instructions at that visit. Printed patient education handouts about the procedure were given to the patient to review at home.

## 2015-11-03 ENCOUNTER — Encounter (HOSPITAL_COMMUNITY): Payer: Self-pay | Admitting: *Deleted

## 2015-11-06 ENCOUNTER — Ambulatory Visit (HOSPITAL_COMMUNITY)
Admission: RE | Admit: 2015-11-06 | Discharge: 2015-11-06 | Disposition: A | Discharge status: Home / Self Care | Payer: Medicaid Other | Source: Ambulatory Visit | Attending: Obstetrics & Gynecology | Admitting: Obstetrics & Gynecology

## 2015-11-06 DIAGNOSIS — R102 Pelvic and perineal pain: Secondary | ICD-10-CM | POA: Diagnosis not present

## 2015-11-06 DIAGNOSIS — Z90710 Acquired absence of both cervix and uterus: Secondary | ICD-10-CM | POA: Insufficient documentation

## 2015-11-11 ENCOUNTER — Telehealth (HOSPITAL_COMMUNITY): Payer: Self-pay | Admitting: *Deleted

## 2015-11-11 NOTE — Telephone Encounter (Signed)
Patient contacted me due to a bill being sent to collections for date of service 11/25/2014. Patient stated she called DSS to confirm her Medicaid was active during that time and DSS confirmed it was. Patient states the bill was sent to collections and the collection agency is calling her. Told patient I would look into it and get back with her.   After speaking with Claris Che who handles the billing for the [facility] she confirmed that Medicaid paid that date of service in full on 05/07/2015. The bill was initially sent to the patient and had went to collections. What happened is the bill wasn't recalled from collections. Tammi Klippel stated she is working on fixing and will call patient.  I called patient to let her know the bill was paid in full by Medicaid and that Bobbi will be calling her to help resolve. Patient verbalized understanding.

## 2015-11-23 ENCOUNTER — Encounter (HOSPITAL_COMMUNITY): Payer: Self-pay | Admitting: *Deleted

## 2015-11-30 ENCOUNTER — Other Ambulatory Visit: Payer: Self-pay | Admitting: Oncology

## 2015-11-30 NOTE — Telephone Encounter (Signed)
Chart reviewed.

## 2015-12-02 ENCOUNTER — Encounter (HOSPITAL_COMMUNITY): Payer: Self-pay

## 2015-12-02 ENCOUNTER — Encounter (HOSPITAL_COMMUNITY)
Admission: RE | Admit: 2015-12-02 | Discharge: 2015-12-02 | Disposition: A | Discharge status: Home / Self Care | Payer: Medicaid Other | Source: Ambulatory Visit | Attending: Obstetrics & Gynecology | Admitting: Obstetrics & Gynecology

## 2015-12-02 DIAGNOSIS — Z01818 Encounter for other preprocedural examination: Secondary | ICD-10-CM | POA: Insufficient documentation

## 2015-12-02 HISTORY — DX: Gastro-esophageal reflux disease without esophagitis: K21.9

## 2015-12-02 LAB — CBC
HEMATOCRIT: 42.2 % (ref 36.0–46.0)
HEMOGLOBIN: 14.3 g/dL (ref 12.0–15.0)
MCH: 31.9 pg (ref 26.0–34.0)
MCHC: 33.9 g/dL (ref 30.0–36.0)
MCV: 94.2 fL (ref 78.0–100.0)
Platelets: 136 10*3/uL — ABNORMAL LOW (ref 150–400)
RBC: 4.48 MIL/uL (ref 3.87–5.11)
RDW: 13.9 % (ref 11.5–15.5)
WBC: 6.9 10*3/uL (ref 4.0–10.5)

## 2015-12-02 NOTE — Patient Instructions (Addendum)
Your procedure is scheduled on:  Tuesday, December 15, 2015  Enter through the Main Entrance of [facility] at:  8:15 AM  Pick up the phone at the desk and dial [phone].  Call this number if you have problems the morning of surgery: [phone].  Remember: Do NOT eat food or drink after:  Midnight Monday, December 14, 2015  Take these medicines the morning of surgery with a SIP OF WATER:  None  Do NOT smoke the day of surgery  Do NOT wear jewelry (body piercing), metal hair clips/bobby pins, make-up, or nail polish. Do NOT wear lotions, powders, or perfumes.  You may wear deodorant. Do NOT shave for 48 hours prior to surgery. Do NOT bring valuables to the hospital. Contacts, dentures, or bridgework may not be worn into surgery.  Have a responsible adult drive you home and stay with you for 24 hours after your procedure

## 2015-12-03 ENCOUNTER — Other Ambulatory Visit: Payer: Self-pay | Admitting: *Deleted

## 2015-12-03 MED ORDER — TAMOXIFEN CITRATE 20 MG PO TABS: 20.0000 mg | ORAL_TABLET | Freq: Every day | ORAL | 2 refills | Status: DC

## 2015-12-03 NOTE — Pre-Procedure Instructions (Signed)
Dr. Smith Robert made aware of Jessica [name]'s low platelet count, he has requested we repeat CBC day of surgery.  Patient has been discharged from the system was unable to place the order will leave a note on the chart for CBC order to be placed day of surgery.

## 2015-12-07 ENCOUNTER — Other Ambulatory Visit: Payer: Self-pay | Admitting: *Deleted

## 2015-12-07 DIAGNOSIS — C50012 Malignant neoplasm of nipple and areola, left female breast: Secondary | ICD-10-CM

## 2015-12-08 ENCOUNTER — Telehealth: Payer: Self-pay

## 2015-12-08 ENCOUNTER — Other Ambulatory Visit: Payer: Medicaid Other

## 2015-12-08 ENCOUNTER — Other Ambulatory Visit: Payer: Self-pay | Admitting: *Deleted

## 2015-12-08 MED ORDER — TAMOXIFEN CITRATE 20 MG PO TABS: 20.0000 mg | ORAL_TABLET | Freq: Every day | ORAL | 2 refills | Status: DC

## 2015-12-08 NOTE — Telephone Encounter (Signed)
Pt called that she is sick and cannot make todays appt. inbasket to schedulers sent. This is a 6 month f/u appt

## 2015-12-10 ENCOUNTER — Other Ambulatory Visit: Payer: Self-pay | Admitting: *Deleted

## 2015-12-10 ENCOUNTER — Other Ambulatory Visit (HOSPITAL_BASED_OUTPATIENT_CLINIC_OR_DEPARTMENT_OTHER): Payer: Medicaid Other

## 2015-12-10 DIAGNOSIS — C50012 Malignant neoplasm of nipple and areola, left female breast: Secondary | ICD-10-CM

## 2015-12-10 DIAGNOSIS — C50412 Malignant neoplasm of upper-outer quadrant of left female breast: Secondary | ICD-10-CM | POA: Diagnosis not present

## 2015-12-10 LAB — CBC WITH DIFFERENTIAL/PLATELET
BASO%: 0.3 % (ref 0.0–2.0)
Basophils Absolute: 0 10*3/uL (ref 0.0–0.1)
EOS%: 7.1 % — AB (ref 0.0–7.0)
Eosinophils Absolute: 0.5 10*3/uL (ref 0.0–0.5)
HCT: 41.2 % (ref 34.8–46.6)
HGB: 14.1 g/dL (ref 11.6–15.9)
LYMPH%: 23.3 % (ref 14.0–49.7)
MCH: 32.1 pg (ref 25.1–34.0)
MCHC: 34.2 g/dL (ref 31.5–36.0)
MCV: 93.8 fL (ref 79.5–101.0)
MONO#: 0.3 10*3/uL (ref 0.1–0.9)
MONO%: 4.2 % (ref 0.0–14.0)
NEUT%: 65.1 % (ref 38.4–76.8)
NEUTROS ABS: 4.2 10*3/uL (ref 1.5–6.5)
PLATELETS: 131 10*3/uL — AB (ref 145–400)
RBC: 4.39 10*6/uL (ref 3.70–5.45)
RDW: 13.2 % (ref 11.2–14.5)
WBC: 6.4 10*3/uL (ref 3.9–10.3)
lymph#: 1.5 10*3/uL (ref 0.9–3.3)

## 2015-12-10 LAB — COMPREHENSIVE METABOLIC PANEL
ALT: 29 U/L (ref 0–55)
ANION GAP: 9 meq/L (ref 3–11)
AST: 26 U/L (ref 5–34)
Albumin: 3.9 g/dL (ref 3.5–5.0)
Alkaline Phosphatase: 56 U/L (ref 40–150)
BUN: 13.1 mg/dL (ref 7.0–26.0)
CHLORIDE: 105 meq/L (ref 98–109)
CO2: 27 meq/L (ref 22–29)
CREATININE: 0.9 mg/dL (ref 0.6–1.1)
Calcium: 9.8 mg/dL (ref 8.4–10.4)
EGFR: 76 mL/min/{1.73_m2} — ABNORMAL LOW (ref >90)
GLUCOSE: 136 mg/dL (ref 70–140)
Potassium: 3.9 mEq/L (ref 3.5–5.1)
SODIUM: 141 meq/L (ref 136–145)
Total Bilirubin: 0.4 mg/dL (ref 0.20–1.20)
Total Protein: 7 g/dL (ref 6.4–8.3)

## 2015-12-15 ENCOUNTER — Ambulatory Visit (HOSPITAL_COMMUNITY): Payer: Medicaid Other | Admitting: Anesthesiology

## 2015-12-15 ENCOUNTER — Ambulatory Visit: Payer: Medicaid Other | Admitting: Oncology

## 2015-12-15 ENCOUNTER — Encounter (HOSPITAL_COMMUNITY)
Admission: RE | Disposition: A | Discharge status: Home / Self Care | Payer: Self-pay | Source: Ambulatory Visit | Attending: Obstetrics & Gynecology

## 2015-12-15 ENCOUNTER — Ambulatory Visit (HOSPITAL_COMMUNITY)
Admission: RE | Admit: 2015-12-15 | Discharge: 2015-12-15 | Disposition: A | Discharge status: Home / Self Care | Payer: Medicaid Other | Source: Ambulatory Visit | Attending: Obstetrics & Gynecology | Admitting: Obstetrics & Gynecology

## 2015-12-15 ENCOUNTER — Encounter (HOSPITAL_COMMUNITY): Payer: Self-pay

## 2015-12-15 DIAGNOSIS — Z853 Personal history of malignant neoplasm of breast: Secondary | ICD-10-CM | POA: Insufficient documentation

## 2015-12-15 DIAGNOSIS — Z4002 Encounter for prophylactic removal of ovary: Secondary | ICD-10-CM | POA: Diagnosis present

## 2015-12-15 DIAGNOSIS — C50412 Malignant neoplasm of upper-outer quadrant of left female breast: Secondary | ICD-10-CM | POA: Diagnosis not present

## 2015-12-15 DIAGNOSIS — K219 Gastro-esophageal reflux disease without esophagitis: Secondary | ICD-10-CM | POA: Diagnosis not present

## 2015-12-15 DIAGNOSIS — Z170 Estrogen receptor positive status [ER+]: Secondary | ICD-10-CM | POA: Diagnosis present

## 2015-12-15 DIAGNOSIS — D271 Benign neoplasm of left ovary: Secondary | ICD-10-CM | POA: Diagnosis not present

## 2015-12-15 DIAGNOSIS — D270 Benign neoplasm of right ovary: Secondary | ICD-10-CM | POA: Diagnosis not present

## 2015-12-15 DIAGNOSIS — F17210 Nicotine dependence, cigarettes, uncomplicated: Secondary | ICD-10-CM | POA: Diagnosis not present

## 2015-12-15 HISTORY — PX: LAPAROSCOPIC SALPINGO OOPHERECTOMY: SHX5927

## 2015-12-15 LAB — TYPE AND SCREEN
ABO/RH(D): O NEG
ANTIBODY SCREEN: NEGATIVE

## 2015-12-15 LAB — CBC
HEMATOCRIT: 41.4 % (ref 36.0–46.0)
Hemoglobin: 14.4 g/dL (ref 12.0–15.0)
MCH: 32 pg (ref 26.0–34.0)
MCHC: 34.8 g/dL (ref 30.0–36.0)
MCV: 92 fL (ref 78.0–100.0)
PLATELETS: 146 10*3/uL — AB (ref 150–400)
RBC: 4.5 MIL/uL (ref 3.87–5.11)
RDW: 13.4 % (ref 11.5–15.5)
WBC: 6.8 10*3/uL (ref 4.0–10.5)

## 2015-12-15 SURGERY — SALPINGO-OOPHORECTOMY, LAPAROSCOPIC
Anesthesia: General | Site: Abdomen | Laterality: Bilateral

## 2015-12-15 MED ORDER — ROCURONIUM BROMIDE 100 MG/10ML IV SOLN
INTRAVENOUS | Status: AC
  Filled 2015-12-15: qty 1

## 2015-12-15 MED ORDER — LACTATED RINGERS IV SOLN
INTRAVENOUS | Status: DC
  Administered 2015-12-15 (×2): via INTRAVENOUS

## 2015-12-15 MED ORDER — SCOPOLAMINE 1 MG/3DAYS TD PT72
1.0000 | MEDICATED_PATCH | Freq: Once | TRANSDERMAL | Status: DC
  Administered 2015-12-15: 1.5 mg via TRANSDERMAL

## 2015-12-15 MED ORDER — IPRATROPIUM-ALBUTEROL 20-100 MCG/ACT IN AERS
INHALATION_SPRAY | RESPIRATORY_TRACT | Status: DC | PRN
  Administered 2015-12-15 (×4): 3 via RESPIRATORY_TRACT

## 2015-12-15 MED ORDER — GLYCOPYRROLATE 0.2 MG/ML IJ SOLN
INTRAMUSCULAR | Status: AC
  Filled 2015-12-15: qty 3

## 2015-12-15 MED ORDER — SCOPOLAMINE 1 MG/3DAYS TD PT72
MEDICATED_PATCH | TRANSDERMAL | Status: AC
  Filled 2015-12-15: qty 1

## 2015-12-15 MED ORDER — FENTANYL CITRATE (PF) 100 MCG/2ML IJ SOLN
INTRAMUSCULAR | Status: AC
  Filled 2015-12-15: qty 2

## 2015-12-15 MED ORDER — KETOROLAC TROMETHAMINE 30 MG/ML IJ SOLN
INTRAMUSCULAR | Status: AC
  Filled 2015-12-15: qty 1

## 2015-12-15 MED ORDER — SODIUM CHLORIDE 0.9 % IJ SOLN
INTRAMUSCULAR | Status: AC
  Filled 2015-12-15: qty 10

## 2015-12-15 MED ORDER — LIDOCAINE HCL (CARDIAC) 20 MG/ML IV SOLN
INTRAVENOUS | Status: AC
  Filled 2015-12-15: qty 5

## 2015-12-15 MED ORDER — SODIUM CHLORIDE 0.9 % IJ SOLN
INTRAMUSCULAR | Status: DC | PRN
  Administered 2015-12-15: 10 mL

## 2015-12-15 MED ORDER — MIDAZOLAM HCL 2 MG/2ML IJ SOLN
INTRAMUSCULAR | Status: DC | PRN
  Administered 2015-12-15: 2 mg via INTRAVENOUS

## 2015-12-15 MED ORDER — SUGAMMADEX SODIUM 200 MG/2ML IV SOLN
INTRAVENOUS | Status: AC
  Filled 2015-12-15: qty 2

## 2015-12-15 MED ORDER — ONDANSETRON HCL 4 MG/2ML IJ SOLN
INTRAMUSCULAR | Status: DC | PRN
  Administered 2015-12-15: 4 mg via INTRAVENOUS

## 2015-12-15 MED ORDER — BUPIVACAINE HCL (PF) 0.5 % IJ SOLN
INTRAMUSCULAR | Status: AC
  Filled 2015-12-15: qty 30

## 2015-12-15 MED ORDER — FENTANYL CITRATE (PF) 100 MCG/2ML IJ SOLN
25.0000 ug | INTRAMUSCULAR | Status: DC | PRN
  Administered 2015-12-15 (×3): 50 ug via INTRAVENOUS

## 2015-12-15 MED ORDER — OXYCODONE-ACETAMINOPHEN 5-325 MG PO TABS
ORAL_TABLET | ORAL | Status: AC
  Filled 2015-12-15: qty 1

## 2015-12-15 MED ORDER — FENTANYL CITRATE (PF) 100 MCG/2ML IJ SOLN
INTRAMUSCULAR | Status: DC | PRN
Start: 2015-12-15 — End: 2015-12-15
  Administered 2015-12-15 (×3): 50 ug via INTRAVENOUS
  Administered 2015-12-15: 100 ug via INTRAVENOUS
  Administered 2015-12-15 (×2): 50 ug via INTRAVENOUS

## 2015-12-15 MED ORDER — OXYCODONE-ACETAMINOPHEN 5-325 MG PO TABS: 1.0000 | ORAL_TABLET | Freq: Four times a day (QID) | ORAL | 0 refills | Status: DC | PRN

## 2015-12-15 MED ORDER — PROMETHAZINE HCL 25 MG/ML IJ SOLN: 6.2500 mg | INTRAMUSCULAR | Status: DC | PRN

## 2015-12-15 MED ORDER — DEXAMETHASONE SODIUM PHOSPHATE 4 MG/ML IJ SOLN
INTRAMUSCULAR | Status: AC
  Filled 2015-12-15: qty 1

## 2015-12-15 MED ORDER — PROPOFOL 10 MG/ML IV BOLUS
INTRAVENOUS | Status: AC
  Filled 2015-12-15: qty 20

## 2015-12-15 MED ORDER — MIDAZOLAM HCL 2 MG/2ML IJ SOLN
INTRAMUSCULAR | Status: AC
  Filled 2015-12-15: qty 2

## 2015-12-15 MED ORDER — LACTATED RINGERS IR SOLN
Status: DC | PRN
  Administered 2015-12-15: 1

## 2015-12-15 MED ORDER — DEXAMETHASONE SODIUM PHOSPHATE 10 MG/ML IJ SOLN
INTRAMUSCULAR | Status: DC | PRN
  Administered 2015-12-15: 10 mg via INTRAVENOUS

## 2015-12-15 MED ORDER — FENTANYL CITRATE (PF) 250 MCG/5ML IJ SOLN
INTRAMUSCULAR | Status: AC
  Filled 2015-12-15: qty 5

## 2015-12-15 MED ORDER — NEOSTIGMINE METHYLSULFATE 10 MG/10ML IV SOLN
INTRAVENOUS | Status: AC
  Filled 2015-12-15: qty 1

## 2015-12-15 MED ORDER — ONDANSETRON HCL 4 MG/2ML IJ SOLN
INTRAMUSCULAR | Status: AC
  Filled 2015-12-15: qty 2

## 2015-12-15 MED ORDER — BUPIVACAINE HCL (PF) 0.5 % IJ SOLN
INTRAMUSCULAR | Status: DC | PRN
  Administered 2015-12-15: 30 mL

## 2015-12-15 MED ORDER — LIDOCAINE HCL (CARDIAC) 20 MG/ML IV SOLN
INTRAVENOUS | Status: DC | PRN
  Administered 2015-12-15: 80 mg via INTRAVENOUS

## 2015-12-15 MED ORDER — PROMETHAZINE HCL 25 MG/ML IJ SOLN: 6.2500 mg | INTRAMUSCULAR | 0 refills | Status: DC | PRN

## 2015-12-15 MED ORDER — ROCURONIUM BROMIDE 100 MG/10ML IV SOLN
INTRAVENOUS | Status: DC | PRN
  Administered 2015-12-15: 50 mg via INTRAVENOUS

## 2015-12-15 MED ORDER — PROPOFOL 10 MG/ML IV BOLUS
INTRAVENOUS | Status: DC | PRN
  Administered 2015-12-15: 200 mg via INTRAVENOUS

## 2015-12-15 MED ORDER — OXYCODONE-ACETAMINOPHEN 5-325 MG PO TABS
1.0000 | ORAL_TABLET | ORAL | Status: DC | PRN
  Administered 2015-12-15: 1 via ORAL

## 2015-12-15 MED ORDER — SUGAMMADEX SODIUM 200 MG/2ML IV SOLN
INTRAVENOUS | Status: DC | PRN
  Administered 2015-12-15: 200 mg via INTRAVENOUS

## 2015-12-15 MED ORDER — LACTATED RINGERS IV SOLN: INTRAVENOUS | Status: DC

## 2015-12-15 MED ORDER — DEXAMETHASONE SODIUM PHOSPHATE 10 MG/ML IJ SOLN
INTRAMUSCULAR | Status: AC
  Filled 2015-12-15: qty 1

## 2015-12-15 SURGICAL SUPPLY — 26 items
CABLE HIGH FREQUENCY MONO STRZ (ELECTRODE) IMPLANT
CATH ROBINSON RED A/P 16FR (CATHETERS) IMPLANT
CLOTH BEACON ORANGE TIMEOUT ST (SAFETY) ×3 IMPLANT
DURAPREP 26ML APPLICATOR (WOUND CARE) ×3 IMPLANT
GLOVE BIO SURGEON STRL SZ7 (GLOVE) ×6 IMPLANT
GLOVE BIOGEL PI IND STRL 7.0 (GLOVE) ×2 IMPLANT
GLOVE BIOGEL PI INDICATOR 7.0 (GLOVE) ×4
GOWN STRL REUS W/TWL LRG LVL3 (GOWN DISPOSABLE) ×6 IMPLANT
GOWN STRL REUS W/TWL XL LVL3 (GOWN DISPOSABLE) ×3 IMPLANT
MANIPULATOR UTERINE 4.5 ZUMI (MISCELLANEOUS) ×3 IMPLANT
NEEDLE INSUFFLATION 120MM (ENDOMECHANICALS) ×3 IMPLANT
NS IRRIG 1000ML POUR BTL (IV SOLUTION) ×3 IMPLANT
PACK LAPAROSCOPY BASIN (CUSTOM PROCEDURE TRAY) ×3 IMPLANT
POUCH SPECIMEN RETRIEVAL 10MM (ENDOMECHANICALS) ×6 IMPLANT
SET IRRIG TUBING LAPAROSCOPIC (IRRIGATION / IRRIGATOR) IMPLANT
SHEARS HARMONIC ACE PLUS 36CM (ENDOMECHANICALS) IMPLANT
SLEEVE XCEL OPT CAN 5 100 (ENDOMECHANICALS) ×3 IMPLANT
SUT VICRYL 0 UR6 27IN ABS (SUTURE) IMPLANT
SUT VICRYL 4-0 PS2 18IN ABS (SUTURE) ×6 IMPLANT
SYR 5ML LL (SYRINGE) ×3 IMPLANT
TOWEL OR 17X24 6PK STRL BLUE (TOWEL DISPOSABLE) ×6 IMPLANT
TRAY FOLEY CATH SILVER 14FR (SET/KITS/TRAYS/PACK) ×3 IMPLANT
TROCAR OPTI TIP 5M 100M (ENDOMECHANICALS) ×3 IMPLANT
TROCAR XCEL DIL TIP R 11M (ENDOMECHANICALS) ×6 IMPLANT
TROCAR XCEL NON-BLD 5MMX100MML (ENDOMECHANICALS) ×6 IMPLANT
WARMER LAPAROSCOPE (MISCELLANEOUS) ×3 IMPLANT

## 2015-12-15 NOTE — Op Note (Signed)
12/15/2015  10:32 AM  PATIENT:  Jessica [name]  50 y.o. female  PRE-OPERATIVE DIAGNOSIS:  Breast cancer; intact ovaries  POST-OPERATIVE DIAGNOSIS:  Same with pelvic adhesions   PROCEDURE:  Procedure(s): LAPAROSCOPIC  BILATERAL  OOPHORECTOMY (Bilateral)  SURGEON:  Surgeon(s) and Role:    * Lavonia Drafts, MD - Primary    * Woodroe Mode, MD - Assisting  ANESTHESIA:   general  EBL:  Total I/O In: 1000 [I.V.:1000] Out: 210 [Urine:200; Blood:10]  BLOOD ADMINISTERED:none  DRAINS: none   LOCAL MEDICATIONS USED:  MARCAINE     SPECIMEN:  Source of Specimen:  ovaries bilateral  DISPOSITION OF SPECIMEN:  PATHOLOGY  COUNTS:  YES  TOURNIQUET:  * No tourniquets in log *  DICTATION: .Note written in EPIC  PLAN OF CARE: Discharge to home after PACU  PATIENT DISPOSITION:  PACU - hemodynamically stable.   Delay start of Pharmacological VTE agent (>24hrs) due to surgical blood loss or risk of bleeding: not applicable  Complications: none immediate   INDICATIONS: 50 y.o. G0P0 with aforementioned preoperative diagnoses here today for definitive surgical management.   Risks of surgery were discussed with the patient including but not limited to: bleeding which may require transfusion or reoperation; infection which may require antibiotics; injury to bowel, bladder, ureters or other surrounding organs; need for additional procedures including laparotomy; thromboembolic phenomenon, incisional problems and other postoperative/anesthesia complications. Written informed consent was obtained.    FINDINGS:  Uterus and bilateral fallopian tubes prev surgically removed.  The ovaries were normal bilaterally. The left ovary was encased in filmy adhesions.  No evidence of endometriosis or any other abdominal/pelvic abnormality.  Normal upper abdomen.   PROCEDURE IN DETAIL:  The patient received intravenous antibiotics and had sequential compression devices applied to her lower extremities  while in the preoperative area.  She was then taken to the operating room where general anesthesia was administered and was found to be adequate.  She was placed in the dorsal lithotomy position, and was prepped and draped in a sterile manner.  A Foley catheter was inserted into her bladder and attached to constant drainage and a sponge on a forceps was placed in the vagina for orientation.  After an adequate timeout was performed, attention was then turned to the patient's abdomen where a 5-mm skin incision was made above  the umbilical fold.  A 7mm trocar with an Optiview was carefully introduced into the intraperitoneal cavity without difficulty and a pneumoperitoneum was obtained using CO2 gas.  A survey of the patient's pelvis and abdomen revealed the findings above. At this point an 10-mm trocar and sleeve were then advanced without difficulty into the right lower quadrant and a 5 mm trocar was placed in the left lower quadrant both under direct visualization.   On the left side, the ovary was encased in filmy adhesion some attached to the bowel.  Theses were released using the endo shears with no energy.  Once the ovary was completely freed up the infundibulopelvic ligament was then clamped and transected with the Harmonic device. Care was taken to eval the ureters which were out of the field of the clamp.  The right infundibulopelvic ligament was also clamped and transected allowing for a bilateral oophorectomy.  Excellent hemostasis was noted. The specimen was then removed from the abdomen through the 11-mm port using an Endocatch bag, under direct visualization.  The operative site was surveyed, and it was found to be hemostatic.  No intraoperative injury to other surrounding  organs was noted.  The abdomen was desufflated and all instruments were then removed from the patient's abdomen. The fascial incision of the right lower quat port was closed with a 0 Vicryl figure of eight stitch. The right lower port  incision was also closed with 3-0 Vicryl subcuticular stitches. Dermabond was used to reapproximate the skin of the other ports.  30cc opf .5% Marcaine was injected into the ports.    The patient will be discharged to home as per PACU criteria.  Routine postoperative instructions given.  She was prescribed Percocet and Ibuprofen. She will follow up in the clinic 4 weeks for postoperative evaluation.  Claudia Greenley L. Harraway-Smith, M.D., [name]

## 2015-12-15 NOTE — Anesthesia Postprocedure Evaluation (Signed)
Anesthesia Post Note  Patient: SAKIYA HULM  Procedure(s) Performed: Procedure(s) (LRB): LAPAROSCOPIC  BILATERAL  OOPHORECTOMY (Bilateral)  Patient location during evaluation: PACU Anesthesia Type: General Level of consciousness: awake and alert Pain management: pain level controlled Vital Signs Assessment: post-procedure vital signs reviewed and stable Respiratory status: spontaneous breathing, nonlabored ventilation, respiratory function stable and patient connected to nasal cannula oxygen Cardiovascular status: blood pressure returned to baseline and stable Postop Assessment: no signs of nausea or vomiting Anesthetic complications: no     Last Vitals:  Vitals:   12/15/15 1045 12/15/15 1100  BP: 135/82 129/83  Pulse: 84 82  Resp: 15 14  Temp:      Last Pain:  Vitals:   12/15/15 1100  TempSrc:   PainSc: 5    Pain Goal: Patients Stated Pain Goal: 5 (12/15/15 1100)               Reginal Lutes

## 2015-12-15 NOTE — Discharge Instructions (Addendum)
Post Anesthesia Home Care Instructions  Activity: Get plenty of rest for the remainder of the day. A responsible adult should stay with you for 24 hours following the procedure.  For the next 24 hours, DO NOT: -Drive a car -Paediatric nurse -Drink alcoholic beverages -Take any medication unless instructed by your physician -Make any legal decisions or sign important papers.  Meals: Start with liquid foods such as gelatin or soup. Progress to regular foods as tolerated. Avoid greasy, spicy, heavy foods. If nausea and/or vomiting occur, drink only clear liquids until the nausea and/or vomiting subsides. Call your physician if vomiting continues.  Special Instructions/Symptoms: Your throat may feel dry or sore from the anesthesia or the breathing tube placed in your throat during surgery. If this causes discomfort, gargle with warm salt water. The discomfort should disappear within 24 hours.  If you had a scopolamine patch placed behind your ear for the management of post- operative nausea and/or vomiting:  1. The medication in the patch is effective for 72 hours, after which it should be removed.  Wrap patch in a tissue and discard in the trash. Wash hands thoroughly with soap and water. 2. You may remove the patch earlier than 72 hours if you experience unpleasant side effects which may include dry mouth, dizziness or visual disturbances. 3. Avoid touching the patch. Wash your hands with soap and water after contact with the patch.   Bilateral Salpingo-Oophorectomy, Care After Refer to this sheet in the next few weeks. These instructions provide you with information on caring for yourself after your procedure. Your health care provider may also give you more specific instructions. Your treatment has been planned according to current medical practices, but problems sometimes occur. Call your health care provider if you have any problems or questions after your procedure. WHAT TO EXPECT  AFTER THE PROCEDURE After your procedure, it is typical to have the following:   Abdominal pain that can be controlled with medicine.  Vaginal spotting.  Constipation.  Menopausal symptoms such as hot flashes, vaginal dryness, and mood swings. HOME CARE INSTRUCTIONS   Get plenty of rest and sleep.  Only take over-the-counter or prescription medicines as directed by your health care provider. Do not take aspirin. It can cause bleeding.  Keep incision areas clean and dry. Remove or change bandages (dressings) only as directed by your health care provider.  Take showers instead of baths for a few weeks as directed by your health care provider.  Limit exercise and activities as directed by your health care provider. Do not lift anything heavier than 5 pounds (2.3 kg) until your health care provider approves.  Do not drive until your health care provider approves.  Follow your health care provider's advice regarding diet. You may be able to resume your usual diet right away.  Drink enough fluids to keep your urine clear or pale yellow.  Do not douche, use tampons, or have sexual intercourse for 6 weeks after the procedure.  Do not drink alcohol until your health care provider says it is okay.  Take your temperature twice a day and write it down.  If you become constipated, you may:  Ask your health care provider about taking a mild laxative.  Add more fruit and bran to your diet.  Drink more fluids.  Follow up with your health care provider as directed. SEEK MEDICAL CARE IF:   You have swelling, redness, or increasing pain in the incision area.  You see pus coming from the  incision area.  You notice a bad smell coming from the wound or dressing.  You have pain, redness, or swelling where the IV access tube was placed.  Your incision is breaking open (the edges are not staying together).  You feel dizzy or feel like fainting.  You develop pain or bleeding when you  urinate.  You develop diarrhea.  You develop nausea and vomiting.  You develop abnormal vaginal discharge.  You develop a rash.  You have pain that is not controlled with medicine. SEEK IMMEDIATE MEDICAL CARE IF:   You develop a fever.  You develop abdominal pain.  You have chest pain.  You develop shortness of breath.  You pass out.  You develop pain, swelling, or redness in your leg.  You develop heavy vaginal bleeding with or without blood clots.   This information is not intended to replace advice given to you by your health care provider. Make sure you discuss any questions you have with your health care provider.   Document Released: 04/04/2005 Document Revised: 12/05/2012 Document Reviewed: 09/26/2012 Elsevier Interactive Patient Education [employer].

## 2015-12-15 NOTE — H&P (Signed)
Preoperative History and Physical  Jessica [name] is a 50 y.o. G0P0 here for surgical management of breast cancer prophylaxis in pt with h/o breast cancer in intact ovaries   Proposed surgery: Laparoscopic bilateral oophorectomy  Past Medical History:  Diagnosis Date  . Anxiety   . Asthma    denies history of asthma  . 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 Chronic heartburn   . COPD (chronic obstructive pulmonary disease) ([place]) 2011   does not use inhaler ofter  . Depression   . Family history of anesthesia complication    Had a hard time waking father up only once after several procedures  . GERD (gastroesophageal reflux disease)   . Heart murmur    PMH: at age 44; no longer have murmur  . Hot flashes   . PVC (premature ventricular contraction)    PMH;at 50 y.o.  Marland Kitchen Shortness of breath    with exertion   Past Surgical History:  Procedure Laterality Date  . ABDOMINAL HYSTERECTOMY    . BREAST SURGERY     left breast biopsy x 2  . CYSTOSCOPY N/A 05/24/2013   Procedure: CYSTOSCOPY;  Surgeon: Lavonia Drafts, MD;  Location: [facility];  Service: Gynecology;  Laterality: N/A;  . polyp removal     . PORTACATH PLACEMENT Right 01/02/2014   Procedure: INSERTION PORT-A-CATH;  Surgeon: [name], MD;  Location: [facility];  Service: General;  Laterality: Right;  . portacath removal    . RADIOACTIVE SEED GUIDED MASTECTOMY WITH AXILLARY SENTINEL LYMPH NODE BIOPSY Left 05/29/2014   Procedure: RADIOACTIVE SEED GUIDED LEFT PARTIAL MASTECTOMY WITH AXILLARY SENTINEL LYMPH NODE BIOPSY;  Surgeon: [name], MD;  Location: [facility];  Service: General;  Laterality: Left;  . ROBOTIC ASSISTED TOTAL HYSTERECTOMY WITH BILATERAL SALPINGO OOPHERECTOMY Bilateral 05/24/2013   Procedure: ROBOTIC ASSISTED TOTAL HYSTERECTOMY WITH BILATERAL SALPINGECTOMY;  Surgeon: Lavonia Drafts, MD;  Location: [facility];   Service: Gynecology;  Laterality: Bilateral;  . TONSILLECTOMY    . WISDOM TOOTH EXTRACTION     OB History    Gravida Para Term Preterm AB Living   0             SAB TAB Ectopic Multiple Live Births                 Patient denies any cervical dysplasia or STIs. Prescriptions Prior to Admission  Medication Sig Dispense Refill Last Dose  . budesonide-formoterol (SYMBICORT) 160-4.5 MCG/ACT inhaler Inhale 1 puff into the lungs 2 (two) times daily as needed (for shortness of breath).    Taking  . calcium carbonate (TUMS - DOSED IN MG ELEMENTAL CALCIUM) 500 MG chewable tablet Chew 1 tablet by mouth daily as needed for heartburn.    Taking  . gabapentin (NEURONTIN) 300 MG capsule TAKE ONE CAPSULE BY MOUTH THREE TIMES DAILY (Patient taking differently: TAKE ONE CAPSULE BY MOUTH Twice TIMES DAILY) 90 capsule 1   . ipratropium (ATROVENT HFA) 17 MCG/ACT inhaler Inhale 1 puff into the lungs every 6 (six) hours as needed for wheezing (SOB).    Taking  . tamoxifen (NOLVADEX) 20 MG tablet Take 1 tablet (20 mg total) by mouth daily. 90 tablet 2   . venlafaxine XR (EFFEXOR-XR) 75 MG 24 hr capsule Take 2 capsules (150 mg total) by mouth daily with breakfast. (Patient not taking: Reported on 12/01/2015) 60 capsule 3 Not Taking at Unknown time  .  zolpidem (AMBIEN) 5 MG tablet Take 1 tablet (5 mg total) by mouth at bedtime as needed for sleep. (Patient not taking: Reported on 12/01/2015) 90 tablet 3 Not Taking at Unknown time    No Known Allergies Social History:   reports that she has been smoking Cigarettes.  She has a 16.00 pack-year smoking history. She has never used smokeless tobacco. She reports that she drinks alcohol. She reports that she does not use drugs. Family History  Problem Relation Age of Onset  . Heart disease Mother   . Hypercholesterolemia Mother   . Heart disease Father   . COPD Father   . Hypercholesterolemia Father   . Cancer Neg Hx     Review of Systems: Noncontributory  PHYSICAL  EXAM: Last menstrual period 03/18/2013. General appearance - alert, well appearing, and in no distress Chest - clear to auscultation, no wheezes, rales or rhonchi, symmetric air entry Heart - normal rate and regular rhythm Abdomen - soft, nontender, nondistended, no masses or organomegaly Pelvic - examination not indicated Extremities - peripheral pulses normal, no pedal edema, no clubbing or cyanosis  Labs: Results for orders placed or performed in visit on 12/10/15 (from the past 336 hour(s))  Comprehensive metabolic panel   Collection Time: 12/10/15  2:24 PM  Result Value Ref Range   Sodium 141 136 - 145 mEq/L   Potassium 3.9 3.5 - 5.1 mEq/L   Chloride 105 98 - 109 mEq/L   CO2 27 22 - 29 mEq/L   Glucose 136 70 - 140 mg/dl   BUN 13.1 7.0 - 26.0 mg/dL   Creatinine 0.9 0.6 - 1.1 mg/dL   Total Bilirubin 0.40 0.20 - 1.20 mg/dL   Alkaline Phosphatase 56 40 - 150 U/L   AST 26 5 - 34 U/L   ALT 29 0 - 55 U/L   Total Protein 7.0 6.4 - 8.3 g/dL   Albumin 3.9 3.5 - 5.0 g/dL   Calcium 9.8 8.4 - 10.4 mg/dL   Anion Gap 9 3 - 11 mEq/L   EGFR 76 (L) >90 ml/min/1.73 m2  CBC with Differential   Collection Time: 12/10/15  2:24 PM  Result Value Ref Range   WBC 6.4 3.9 - 10.3 10e3/uL   NEUT# 4.2 1.5 - 6.5 10e3/uL   HGB 14.1 11.6 - 15.9 g/dL   HCT 41.2 34.8 - 46.6 %   Platelets 131 (L) 145 - 400 10e3/uL   MCV 93.8 79.5 - 101.0 fL   MCH 32.1 25.1 - 34.0 pg   MCHC 34.2 31.5 - 36.0 g/dL   RBC 4.39 3.70 - 5.45 10e6/uL   RDW 13.2 11.2 - 14.5 %   lymph# 1.5 0.9 - 3.3 10e3/uL   MONO# 0.3 0.1 - 0.9 10e3/uL   Eosinophils Absolute 0.5 0.0 - 0.5 10e3/uL   Basophils Absolute 0.0 0.0 - 0.1 10e3/uL   NEUT% 65.1 38.4 - 76.8 %   LYMPH% 23.3 14.0 - 49.7 %   MONO% 4.2 0.0 - 14.0 %   EOS% 7.1 (H) 0.0 - 7.0 %   BASO% 0.3 0.0 - 2.0 %  Results for orders placed or performed during the hospital encounter of 12/02/15 (from the past 336 hour(s))  CBC   Collection Time: 12/02/15  1:40 PM  Result Value Ref  Range   WBC 6.9 4.0 - 10.5 K/uL   RBC 4.48 3.87 - 5.11 MIL/uL   Hemoglobin 14.3 12.0 - 15.0 g/dL   HCT 42.2 36.0 - 46.0 %   MCV 94.2 78.0 -  100.0 fL   MCH 31.9 26.0 - 34.0 pg   MCHC 33.9 30.0 - 36.0 g/dL   RDW 13.9 11.5 - 15.5 %   Platelets 136 (L) 150 - 400 K/uL    Imaging Studies: No results found.  Assessment: Patient Active Problem List   Diagnosis Date Noted  . Hepatic steatosis 09/02/2014  . 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 Enchondroma of bone 07/02/2014  . Lytic bone lesion of left femur   . Low back pain 03/25/2014  . Constipation 03/25/2014  . Thrush 03/04/2014  . Spasm of back muscles 03/04/2014  . Insomnia 02/13/2014  . UTI (urinary tract infection) 01/27/2014  . Cerumen impaction 01/27/2014  . Diarrhea 01/27/2014  . Sinusitis 01/20/2014  . Acute sinusitis 01/20/2014  . Rash 01/20/2014  . Excessive cerumen in both ear canals 01/20/2014  . Oral mucositis due to antineoplastic therapy 01/20/2014  . Breast cancer of upper-outer quadrant of left female breast ([place]) 12/03/2013  . Postoperative infection 06/04/2013  . HYPERTRIGLYCERIDEMIA 10/07/2009  . MENORRHAGIA 10/07/2009  . TOBACCO ABUSE 06/22/2009  . OVARIAN CYST 06/22/2009  . ELEVATED BLOOD PRESSURE WITHOUT DIAGNOSIS OF HYPERTENSION 06/22/2009  . CHRONIC OBSTRUCTIVE PULMONARY DISEASE 06/11/2009    Plan: Patient will undergo surgical management with Laparoscopic bilateral oophorectomy.   The risks of surgery were discussed in detail with the patient including but not limited to: bleeding which may require transfusion or reoperation; infection which may require antibiotics; injury to surrounding organs which may involve bowel, bladder, ureters ; need for additional procedures including laparoscopy or laparotomy; thromboembolic phenomenon, surgical site problems and other postoperative/anesthesia complications. Likelihood of success in alleviating the patient's condition was discussed. Routine  postoperative instructions will be reviewed with the patient and her family in detail after surgery.  The patient concurred with the proposed plan, giving informed written consent for the surgery.  Patient has been NPO since last night she will remain NPO for procedure.  Anesthesia and OR aware.  Preoperative prophylactic antibiotics and SCDs ordered on call to the OR.  To OR when ready.  Karlin Binion L. Harraway-Smith, M.D., [facility] 12/15/2015 8:10 AM

## 2015-12-15 NOTE — Brief Op Note (Signed)
12/15/2015  10:32 AM  PATIENT:  Jessica [name]  50 y.o. female  PRE-OPERATIVE DIAGNOSIS:  Breast cancer; intact ovaries  POST-OPERATIVE DIAGNOSIS:  Same with pelvic adhesions   PROCEDURE:  Procedure(s): LAPAROSCOPIC  BILATERAL  OOPHORECTOMY (Bilateral)  SURGEON:  Surgeon(s) and Role:    * Lavonia Drafts, MD - Primary    * Woodroe Mode, MD - Assisting  ANESTHESIA:   general  EBL:  Total I/O In: 1000 [I.V.:1000] Out: 210 [Urine:200; Blood:10]  BLOOD ADMINISTERED:none  DRAINS: none   LOCAL MEDICATIONS USED:  MARCAINE     SPECIMEN:  Source of Specimen:  ovaries bilateral  DISPOSITION OF SPECIMEN:  PATHOLOGY  COUNTS:  YES  TOURNIQUET:  * No tourniquets in log *  DICTATION: .Note written in EPIC  PLAN OF CARE: Discharge to home after PACU  PATIENT DISPOSITION:  PACU - hemodynamically stable.   Delay start of Pharmacological VTE agent (>24hrs) due to surgical blood loss or risk of bleeding: not applicable  Complications: none immediate  [name], M.D., [name]

## 2015-12-15 NOTE — Anesthesia Procedure Notes (Signed)
Procedure Name: Intubation Date/Time: 12/15/2015 8:52 AM Performed by: Brock Ra Pre-anesthesia Checklist: Patient identified, Emergency Drugs available, Suction available, Patient being monitored and Timeout performed Patient Re-evaluated:Patient Re-evaluated prior to inductionOxygen Delivery Method: Circle system utilized Preoxygenation: Pre-oxygenation with 100% oxygen Intubation Type: IV induction Ventilation: Mask ventilation without difficulty Laryngoscope Size: Mac and 3 Grade View: Grade I Tube type: Oral Tube size: 7.0 mm Number of attempts: 1 Airway Equipment and Method: Stylet Placement Confirmation: ETT inserted through vocal cords under direct vision,  positive ETCO2 and breath sounds checked- equal and bilateral Secured at: 21 (21 at lips) cm Tube secured with: Tape Dental Injury: Teeth and Oropharynx as per pre-operative assessment

## 2015-12-15 NOTE — Transfer of Care (Addendum)
Immediate Anesthesia Transfer of Care Note  Patient: Jessica [name]  Procedure(s) Performed: Procedure(s): LAPAROSCOPIC  BILATERAL  OOPHORECTOMY (Bilateral)  Patient Location: PACU  Anesthesia Type:General  Level of Consciousness: awake, alert , oriented and patient cooperative  Airway & Oxygen Therapy: Patient connected to nasal cannula oxygen  Post-op Assessment: Report given to RN, Post -op Vital signs reviewed and stable and Patient moving all extremities X 4  Post vital signs: Reviewed and stable  Last Vitals:  TEMP 97.5 BP 116/77 HR 90 RR 13 POX 96  Last Pain:  Vitals:   12/15/15 0816  TempSrc: Oral  PainSc: 6          Complications: No apparent anesthesia complications

## 2015-12-15 NOTE — Anesthesia Preprocedure Evaluation (Addendum)
Anesthesia Evaluation  Patient identified by MRN, date of birth, ID band Patient awake    Reviewed: Allergy & Precautions, NPO status , Patient's Chart, lab work & pertinent test results  Airway Mallampati: II  TM Distance: >3 FB Neck ROM: Full    Dental  (+) Teeth Intact, Dental Advisory Given   Pulmonary COPD,  COPD inhaler, Current Smoker,    breath sounds clear to auscultation       Cardiovascular  Rhythm:Regular Rate:Normal     Neuro/Psych PSYCHIATRIC DISORDERS Anxiety Depression    GI/Hepatic Neg liver ROS, GERD  Controlled,  Endo/Other  negative endocrine ROS  Renal/GU negative Renal ROS     Musculoskeletal   Abdominal   Peds  Hematology negative hematology ROS (+)   Anesthesia Other Findings Hx breast cancer  Reproductive/Obstetrics                            Anesthesia Physical  Anesthesia Plan  ASA: III  Anesthesia Plan: General   Post-op Pain Management:  Regional for Post-op pain   Induction: Intravenous  Airway Management Planned: LMA  Additional Equipment:   Intra-op Plan:   Post-operative Plan: Extubation in OR  Informed Consent: I have reviewed the patients History and Physical, chart, labs and discussed the procedure including the risks, benefits and alternatives for the proposed anesthesia with the patient or authorized representative who has indicated his/her understanding and acceptance.   Dental advisory given  Plan Discussed with: CRNA, Anesthesiologist and Surgeon  Anesthesia Plan Comments:         Anesthesia Quick Evaluation

## 2015-12-16 ENCOUNTER — Encounter (HOSPITAL_COMMUNITY): Payer: Self-pay | Admitting: Obstetrics & Gynecology

## 2015-12-24 ENCOUNTER — Ambulatory Visit (HOSPITAL_BASED_OUTPATIENT_CLINIC_OR_DEPARTMENT_OTHER): Payer: Medicaid Other | Admitting: Oncology

## 2015-12-24 ENCOUNTER — Telehealth: Payer: Self-pay | Admitting: Oncology

## 2015-12-24 VITALS — BP 120/80 | HR 68 | Temp 98.7°F | Resp 18 | Ht 65.0 in | Wt 194.5 lb

## 2015-12-24 DIAGNOSIS — Z853 Personal history of malignant neoplasm of breast: Secondary | ICD-10-CM | POA: Diagnosis not present

## 2015-12-24 DIAGNOSIS — C50412 Malignant neoplasm of upper-outer quadrant of left female breast: Secondary | ICD-10-CM

## 2015-12-24 MED ORDER — TAMOXIFEN CITRATE 20 MG PO TABS: 20.0000 mg | ORAL_TABLET | Freq: Every day | ORAL | 2 refills | Status: DC

## 2015-12-24 MED ORDER — OXYCODONE-ACETAMINOPHEN 5-325 MG PO TABS: 1.0000 | ORAL_TABLET | Freq: Four times a day (QID) | ORAL | 0 refills | Status: DC | PRN

## 2015-12-24 MED ORDER — VENLAFAXINE HCL ER 75 MG PO CP24: ORAL_CAPSULE | ORAL | 3 refills | Status: AC

## 2015-12-24 NOTE — Telephone Encounter (Signed)
appt made and avs printed °

## 2015-12-24 NOTE — Progress Notes (Signed)
[place]  [phone] Fax:([phone]) [phone]    ID: Jessica [name] DOB: 1966-04-05  MR#: [id number]  VWP#:[id number]  Patient Care Team: Jefm Petty, MD as PCP - General (Family Medicine) [name], MD as Consulting Physician (General Surgery) [name], MD as Consulting Physician (Oncology) Kyung Rudd, MD as Consulting Physician (Radiation Oncology) Melina Schools, MD as Consulting Physician (Orthopedic Surgery)  OTHER: Melina Schools MD, Philis Fendt MD  CHIEF COMPLAINT: triple positive breast cancer  CURRENT TREATMENT: Tamoxifen  BREAST CANCER HISTORY: From the original intake note:  Jessica [name] herself palpated a mass in her left breast. She brought it to the attention of [name] at the health department and she was set up for unilateral left mammography and ultrasound 11/27/2013 at the [facility]. This showed a possible distortion in the left breast upper outer quadrant. However the patient's breasts are density category D. On exam there was a firm nodule or mass in the left breast 2:00 location which by ultrasound was irregular and hypoechoic and measured 1.5 cm. There was no left axillary lymphadenopathy noted.  Biopsy of the mass in question 11/29/2013 showed ([id number]) an invasive ductal carcinoma, grade 1, estrogen receptor 94% positive, progesterone receptor 94% positive, both with strong staining intensity, with an MIB-1 of 46%, and HER-2 amplified by CISH, the signals ratio of 4.85 and the number per cell 6.55.  On 12/08/2013 the patient underwent bilateral breast MRI. This showed in the left breast upper outer quadrant a 2.9 cm irregular enhancing mass and extending posteriorly from this an area of clumped non-masslike enhancement extending approximately 6 cm and leading to a posterior group of nodules which were small but measured in aggregate 2.2 cm. In addition, there was a separate 0.8 cm irregular spiculated enhancing  mass in the upper inner aspect of the left breast. In the lower outer left breast there was a 0.8 cm oval enhancing mass. There were no abnormal appearing lymph nodes.  [name] subsequent history is as detailed below  INTERVAL HISTORY: Jessica [name] returns today for follow up of her triple positive breast cancer accompanied by her significant other, Jessica.. Since her last visit here she underwent bilateral oophorectomy 12/15/2015. The final pathology ([id number]) showed no evidence of malignancy.  She continues on tamoxifen, which she generally tolerates well. Vaginal wetness and hot flashes are not major concerns. She continues on venlafaxine for the hot flashes. Both that medication on her tamoxifen for refill today  REVIEW OF SYSTEMS: Jessica [name] continues to have pains in multiple joints, which are very bothersome to her. She is on gabapentin for this. I know occasionally uses over-the-counter analgesics. A detailed review of systems today was otherwise stable  PAST MEDICAL HISTORY: Past Medical History:  Diagnosis Date  . Anxiety   . Asthma    denies history of asthma  . Breast cancer ([place]) 05/29/14   left breast Lumpectomy=Invasive ductal Carcinoma  . 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 Chronic heartburn   . COPD (chronic obstructive pulmonary disease) ([place]) 2011   does not use inhaler ofter  . Depression   . Family history of anesthesia complication    Had a hard time waking father up only once after several procedures  . GERD (gastroesophageal reflux disease)   . Heart murmur    PMH: at age 67; no longer have murmur  . Hot flashes   . PVC (premature ventricular contraction)    PMH;at 50 y.o.  Marland Kitchen Shortness of breath  with exertion    PAST SURGICAL HISTORY: Past Surgical History:  Procedure Laterality Date  . ABDOMINAL HYSTERECTOMY    . BREAST SURGERY     left breast biopsy x 2  . CYSTOSCOPY N/A 05/24/2013   Procedure: CYSTOSCOPY;  Surgeon: Lavonia Drafts, MD;  Location: [facility];   Service: Gynecology;  Laterality: N/A;  . LAPAROSCOPIC SALPINGO OOPHERECTOMY Bilateral 12/15/2015   Procedure: LAPAROSCOPIC  BILATERAL  OOPHORECTOMY;  Surgeon: Lavonia Drafts, MD;  Location: [facility];  Service: Gynecology;  Laterality: Bilateral;  . polyp removal     . PORTACATH PLACEMENT Right 01/02/2014   Procedure: INSERTION PORT-A-CATH;  Surgeon: [name], MD;  Location: [facility];  Service: General;  Laterality: Right;  . portacath removal    . RADIOACTIVE SEED GUIDED MASTECTOMY WITH AXILLARY SENTINEL LYMPH NODE BIOPSY Left 05/29/2014   Procedure: RADIOACTIVE SEED GUIDED LEFT PARTIAL MASTECTOMY WITH AXILLARY SENTINEL LYMPH NODE BIOPSY;  Surgeon: [name], MD;  Location: [facility];  Service: General;  Laterality: Left;  . ROBOTIC ASSISTED TOTAL HYSTERECTOMY WITH BILATERAL SALPINGO OOPHERECTOMY Bilateral 05/24/2013   Procedure: ROBOTIC ASSISTED TOTAL HYSTERECTOMY WITH BILATERAL SALPINGECTOMY;  Surgeon: Lavonia Drafts, MD;  Location: [facility];  Service: Gynecology;  Laterality: Bilateral;  . TONSILLECTOMY    . WISDOM TOOTH EXTRACTION      FAMILY HISTORY Family History  Problem Relation Age of Onset  . Heart disease Mother   . Hypercholesterolemia Mother   . Heart disease Father   . COPD Father   . Hypercholesterolemia Father   . Cancer Neg Hx   [name] parents are still living, her father is 31 years old and her mother 29 years old as of August 2015. The patient had no brothers, one sister. There is no history of breast or ovarian cancer in the family.  GYNECOLOGIC HISTORY:  Patient's last menstrual period was 03/18/2013. Menarche age 34. She is GX P0. She took oral contraceptives for many years as well as Depo Provera shots. She is status post hysterectomy with bilateral salpingectomy. 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 She scans at work, and she also does all the shelving and  stocking. She is single but for the last 11 years as lives with her significant other Jessica [name]. He is disabled secondary to multiple orthopedic problems including bilateral avascular necrosis of the hips. He also had a R-body CVA January 2016. Both of them do smoke.     ADVANCED DIRECTIVES: Not in place    HEALTH MAINTENANCE: Social History  Substance Use Topics  . Smoking status: Current Every [name] Smoker    Packs/[name]: 1.00    Years: 16.00    Types: Cigarettes  . Smokeless tobacco: Never Used     Comment: 1/2ppd decreased  . Alcohol use 0.0 oz/week     Comment: occasional      Colonoscopy:  PAP: January 2015   Bone density:  Lipid panel:  No Known Allergies  Current Outpatient Prescriptions  Medication Sig Dispense Refill  . budesonide-formoterol (SYMBICORT) 160-4.5 MCG/ACT inhaler Inhale 1 puff into the lungs 2 (two) times daily as needed (for shortness of breath).     . calcium carbonate (TUMS - DOSED IN MG ELEMENTAL CALCIUM) 500 MG chewable tablet Chew 1 tablet by mouth daily as needed for heartburn.     . gabapentin (NEURONTIN) 300 MG capsule TAKE ONE CAPSULE BY MOUTH THREE TIMES DAILY (Patient taking differently: TAKE ONE  CAPSULE BY MOUTH Twice TIMES DAILY) 90 capsule 1  . ipratropium (ATROVENT HFA) 17 MCG/ACT inhaler Inhale 1 puff into the lungs every 6 (six) hours as needed for wheezing (SOB).     . LORazepam (ATIVAN) 0.5 MG tablet Take 0.5 mg by mouth every 8 (eight) hours.    Marland Kitchen oxyCODONE-acetaminophen (PERCOCET/ROXICET) 5-325 MG tablet Take 1 tablet by mouth every 6 (six) hours as needed. 10 tablet 0  . promethazine (PHENERGAN) 25 MG/ML injection Inject 0.25-0.5 mLs (6.25-12.5 mg total) into the vein every 15 (fifteen) minutes as needed for nausea. 1 mL 0  . tamoxifen (NOLVADEX) 20 MG tablet Take 1 tablet (20 mg total) by mouth daily. 90 tablet 2  . venlafaxine XR (EFFEXOR-XR) 75 MG 24 hr capsule Take 2 capsules (150 mg total) by mouth daily with breakfast. (Patient not  taking: Reported on 12/01/2015) 60 capsule 3  . zolpidem (AMBIEN) 5 MG tablet Take 1 tablet (5 mg total) by mouth at bedtime as needed for sleep. (Patient not taking: Reported on 12/01/2015) 90 tablet 3   No current facility-administered medications for this visit.     OBJECTIVE: middle-aged white woman In no acute distress Vitals:   12/24/15 1004  BP: 120/80  Pulse: 68  Resp: 18  Temp: 98.7 F (37.1 C)     Body mass index is 32.37 kg/m.    ECOG FS:1 - Symptomatic but completely ambulatory  Sclerae unicteric, EOMs intact Oropharynx clear and moist No cervical or supraclavicular adenopathy Lungs no rales or rhonchi Heart regular rate and rhythm Abd soft, nontender, positive bowel sounds MSK no focal spinal tenderness, no upper extremity lymphedema Neuro: nonfocal, well oriented, appropriate affect Breasts: The right breast is unremarkable. The left breast is status post lumpectomy and radiation. There is no evidence of local recurrence. The left axilla is benign.    LAB RESULTS:  CMP     Component Value Date/Time   NA 141 12/10/2015 1424   K 3.9 12/10/2015 1424   CL 103 12/31/2013 1448   CO2 27 12/10/2015 1424   GLUCOSE 136 12/10/2015 1424   BUN 13.1 12/10/2015 1424   CREATININE 0.9 12/10/2015 1424   CALCIUM 9.8 12/10/2015 1424   PROT 7.0 12/10/2015 1424   ALBUMIN 3.9 12/10/2015 1424   AST 26 12/10/2015 1424   ALT 29 12/10/2015 1424   ALKPHOS 56 12/10/2015 1424   BILITOT 0.40 12/10/2015 1424   GFRNONAA 83 (L) 12/31/2013 1448   GFRAA >90 12/31/2013 1448    I No results found for: SPEP  Lab Results  Component Value Date   WBC 6.8 12/15/2015   NEUTROABS 4.2 12/10/2015   HGB 14.4 12/15/2015   HCT 41.4 12/15/2015   MCV 92.0 12/15/2015   PLT 146 (L) 12/15/2015      Chemistry      Component Value Date/Time   NA 141 12/10/2015 1424   K 3.9 12/10/2015 1424   CL 103 12/31/2013 1448   CO2 27 12/10/2015 1424   BUN 13.1 12/10/2015 1424   CREATININE 0.9  12/10/2015 1424      Component Value Date/Time   CALCIUM 9.8 12/10/2015 1424   ALKPHOS 56 12/10/2015 1424   AST 26 12/10/2015 1424   ALT 29 12/10/2015 1424   BILITOT 0.40 12/10/2015 1424       No results found for: LABCA2  No components found for: LABCA125  No results for input(s): INR in the last 168 hours.  Urinalysis    Component Value Date/Time   COLORURINE  YELLOW 06/04/2013 1420   APPEARANCEUR HAZY (A) 06/04/2013 1420   LABSPEC 1.030 01/27/2014 1350   PHURINE 5.0 01/27/2014 1350   PHURINE 6.0 06/04/2013 1420   GLUCOSEU Negative 01/27/2014 1350   HGBUR Negative 01/27/2014 1350   HGBUR MODERATE (A) 06/04/2013 1420   HGBUR negative 10/07/2009 1044   BILIRUBINUR Color Interference 01/27/2014 1350   KETONESUR Color Interference 01/27/2014 1350   KETONESUR NEGATIVE 06/04/2013 1420   PROTEINUR Color Interference 01/27/2014 1350   PROTEINUR NEGATIVE 06/04/2013 1420   UROBILINOGEN Color Interference 01/27/2014 1350   NITRITE Color Interference 01/27/2014 1350   NITRITE NEGATIVE 06/04/2013 1420   LEUKOCYTESUR Color Interference 01/27/2014 1350    STUDIES: CLINICAL DATA:  Pelvic pain.  Status post hysterectomy.  EXAM: TRANSABDOMINAL AND TRANSVAGINAL ULTRASOUND OF PELVIS  TECHNIQUE: Both transabdominal and transvaginal ultrasound examinations of the pelvis were performed. Transabdominal technique was performed for global imaging of the pelvis including uterus, ovaries, adnexal regions, and pelvic cul-de-sac. It was necessary to proceed with endovaginal exam following the transabdominal exam to visualize the endometrium and ovaries.  COMPARISON:  None  FINDINGS: Uterus  Measurements: Previous hysterectomy.  Endometrium  Thickness:  not applicable.  Right ovary  Measurements: 3.8 x 1.9 x 3.1 cm. Normal appearance/no adnexal mass.  Left ovary  Measurements: 3.4 x 1.8 x 2.8 cm. Normal appearance/no adnexal mass.  Other findings  No  abnormal free fluid.  IMPRESSION: Previous hysterectomy.  1. No acute findings to explain patient's pelvic pain   Electronically Signed   By: Kerby Moors M.D.   On: 11/06/2015 11:05  No results found.  ASSESSMENT: 50 y.o. BRCA negative [place] woman status post left breast upper outer quadrant biopsy 11/29/2013 for a clinical T2 N0, stage IIA invasive ductal carcinoma, grade 1, triple positive, with an MIB-1 of 46%.  (1) MRI-guided biopsy of two additional areas in the left breast showed only tubular adenomas, no malignancy.   (2) neoadjuvant chemotherapy started 01/14/2014, consisting of carboplatin, docetaxel, trastuzumab and pertuzumab given every 21 days for 6 cycles, with Neulasta on [name] 2. Completed 04/30/14  (3) trastuzumab continued to total one year, last dose 01/06/2015  (a) most recent echo 10/06/2014 shows a well-preserved ejection fraction  (4) status post left lumpectomy with axillary sentinel lymph node biopsy on 05/29/2014 showing a residual ypT1c ypN0 invasive ductal carcinoma, grade 2, with negative margins   (5) adjuvant radiation completed 08/27/2014  (6) started tamoxifen 09/30/2014  (a) normal bone scan 08/28/2014  (7) abnormal radiotracer uptake in distal left femur evaluated with CT-guided biopsy 06/20/2014, showing enchondroma, with low cellularity, no mitotic figures, and no atypia.  (8) persistent low pelvic discomfort, with unremarkable transvaginal ultrasonography 12/12/2013 and CT of the abdomen and pelvis 08/29/2014  (9) status post remote total abdominal hysterectomy with bilateral salpingo-oophorectomy  PLAN: Adelai is now a year and a half from definitive surgery for her breast cancer with no evidence of disease recurrence. This is very favorable.  She continues on tamoxifen with well-controlled symptoms. The plan will be to continue that for a minimum of 5 years, potentially 10.  At this point I am comfortable seeing her on a once a  year basis. She knows to call for any problems that may develop before her next visit here.   [name], MD   12/24/2015 10:23 AM

## 2016-01-13 ENCOUNTER — Ambulatory Visit (INDEPENDENT_AMBULATORY_CARE_PROVIDER_SITE_OTHER): Payer: Medicaid Other | Admitting: Obstetrics & Gynecology

## 2016-01-13 ENCOUNTER — Encounter: Payer: Self-pay | Admitting: Obstetrics & Gynecology

## 2016-01-13 VITALS — BP 110/79 | HR 79 | Wt 192.4 lb

## 2016-01-13 DIAGNOSIS — Z9889 Other specified postprocedural states: Secondary | ICD-10-CM

## 2016-01-13 DIAGNOSIS — N951 Menopausal and female climacteric states: Secondary | ICD-10-CM

## 2016-01-13 MED ORDER — GABAPENTIN 300 MG PO CAPS: 600.0000 mg | ORAL_CAPSULE | Freq: Two times a day (BID) | ORAL | 1 refills | Status: DC

## 2016-01-13 NOTE — Patient Instructions (Signed)
Menopause Menopause is the normal time of life when menstrual periods stop completely. Menopause is complete when you have missed 12 consecutive menstrual periods. It usually occurs between the ages of 48 years and 55 years. Very rarely does a woman develop menopause before the age of 40 years. At menopause, your ovaries stop producing the female hormones estrogen and progesterone. This can cause undesirable symptoms and also affect your health. Sometimes the symptoms may occur 4-5 years before the menopause begins. There is no relationship between menopause and:  Oral contraceptives.  Number of children you had.  Race.  The age your menstrual periods started (menarche). Heavy smokers and very thin women may develop menopause earlier in life. CAUSES  The ovaries stop producing the female hormones estrogen and progesterone.  Other causes include:  Surgery to remove both ovaries.  The ovaries stop functioning for no known reason.  Tumors of the pituitary gland in the brain.  Medical disease that affects the ovaries and hormone production.  Radiation treatment to the abdomen or pelvis.  Chemotherapy that affects the ovaries. SYMPTOMS   Hot flashes.  Night sweats.  Decrease in sex drive.  Vaginal dryness and thinning of the vagina causing painful intercourse.  Dryness of the skin and developing wrinkles.  Headaches.  Tiredness.  Irritability.  Memory problems.  Weight gain.  Bladder infections.  Hair growth of the face and chest.  Infertility. More serious symptoms include:  Loss of bone (osteoporosis) causing breaks (fractures).  Depression.  Hardening and narrowing of the arteries (atherosclerosis) causing heart attacks and strokes. DIAGNOSIS   When the menstrual periods have stopped for 12 straight months.  Physical exam.  Hormone studies of the blood. TREATMENT  There are many treatment choices and nearly as many questions about them. The  decisions to treat or not to treat menopausal changes is an individual choice made with your health care provider. Your health care provider can discuss the treatments with you. Together, you can decide which treatment will work best for you. Your treatment choices may include:   Hormone therapy (estrogen and progesterone).  Non-hormonal medicines.  Treating the individual symptoms with medicine (for example antidepressants for depression).  Herbal medicines that may help specific symptoms.  Counseling by a psychiatrist or psychologist.  Group therapy.  Lifestyle changes including:  Eating healthy.  Regular exercise.  Limiting caffeine and alcohol.  Stress management and meditation.  No treatment. HOME CARE INSTRUCTIONS   Take the medicine your health care provider gives you as directed.  Get plenty of sleep and rest.  Exercise regularly.  Eat a diet that contains calcium (good for the bones) and soy products (acts like estrogen hormone).  Avoid alcoholic beverages.  Do not smoke.  If you have hot flashes, dress in layers.  Take supplements, calcium, and vitamin D to strengthen bones.  You can use over-the-counter lubricants or moisturizers for vaginal dryness.  Group therapy is sometimes very helpful.  Acupuncture may be helpful in some cases. SEEK MEDICAL CARE IF:   You are not sure you are in menopause.  You are having menopausal symptoms and need advice and treatment.  You are still having menstrual periods after age 55 years.  You have pain with intercourse.  Menopause is complete (no menstrual period for 12 months) and you develop vaginal bleeding.  You need a referral to a specialist (gynecologist, psychiatrist, or psychologist) for treatment. SEEK IMMEDIATE MEDICAL CARE IF:   You have severe depression.  You have excessive vaginal bleeding.    You fell and think you have a broken bone.  You have pain when you urinate.  You develop leg or  chest pain.  You have a fast pounding heart beat (palpitations).  You have severe headaches.  You develop vision problems.  You feel a lump in your breast.  You have abdominal pain or severe indigestion.   This information is not intended to replace advice given to you by your health care provider. Make sure you discuss any questions you have with your health care provider.   Document Released: 06/25/2003 Document Revised: 12/05/2012 Document Reviewed: 11/01/2012 Elsevier Interactive Patient Education 2016 [employer]  

## 2016-01-13 NOTE — Progress Notes (Signed)
History:  50 y.o. G0P0 here today for 4 week post op check.  Pt denies pain currently but, reports that it was more painful than her RATH.  She reports that her hot flushes have become worse since her bilaetal oophorectomy.  She and her partner are planning a trip to [place] in Oct.  The following portions of the patient's history were reviewed and updated as appropriate: allergies, current medications, past family history, past medical history, past social history, past surgical history and problem list.  Review of Systems:  Pertinent items are noted in HPI.  Objective:  Physical Exam Blood pressure 110/79, pulse 79, weight 192 lb 6.4 oz (87.3 kg), last menstrual period 03/18/2013. Gen: NAD Abd: Soft, nontender and nondistended. Ports sites well healed.      Labs and Imaging surg path 12/15/2015 Diagnosis Ovaries, bilateral excision - BENIGN SEROUS CYSTADENOMA. - NO EVIDENCE OF BORDERLINE CHANGE OR MALIGNANCY.  Assessment & Plan:  4 week post op check doing well Increased vasomotor sx  Increase Gabapentin to from 300bg bid to 600mg  bid F/u in 3 months for management of hot flushes Gradual return to full activities.  Cresta Riden L. Harraway-Smith, M.D., [name]

## 2016-01-14 ENCOUNTER — Encounter: Payer: Self-pay | Admitting: Obstetrics & Gynecology

## 2016-02-23 ENCOUNTER — Other Ambulatory Visit: Payer: Self-pay | Admitting: Oncology

## 2016-02-23 DIAGNOSIS — Z853 Personal history of malignant neoplasm of breast: Secondary | ICD-10-CM

## 2016-03-28 ENCOUNTER — Ambulatory Visit
Admission: RE | Admit: 2016-03-28 | Discharge: 2016-03-28 | Disposition: A | Discharge status: Home / Self Care | Payer: Medicaid Other | Source: Ambulatory Visit | Attending: Oncology | Admitting: Oncology

## 2016-03-28 DIAGNOSIS — Z853 Personal history of malignant neoplasm of breast: Secondary | ICD-10-CM

## 2016-04-19 ENCOUNTER — Other Ambulatory Visit: Payer: Self-pay | Admitting: Nurse Practitioner

## 2016-08-08 IMAGING — CR DG ABDOMEN 2V
2 series · 2 of 2 positions shown · non-contrast
Comparison: None; correlation CT abdomen and pelvis 06/04/2013

CLINICAL DATA: LEFT-sided lower abdominal pain for 3 months,
history breast cancer on chemotherapy, LEFT lower abdominal
tenderness, constipation and diarrhea, COPD

EXAM:
ABDOMEN - 2 VIEW

[w abdomen upright *]
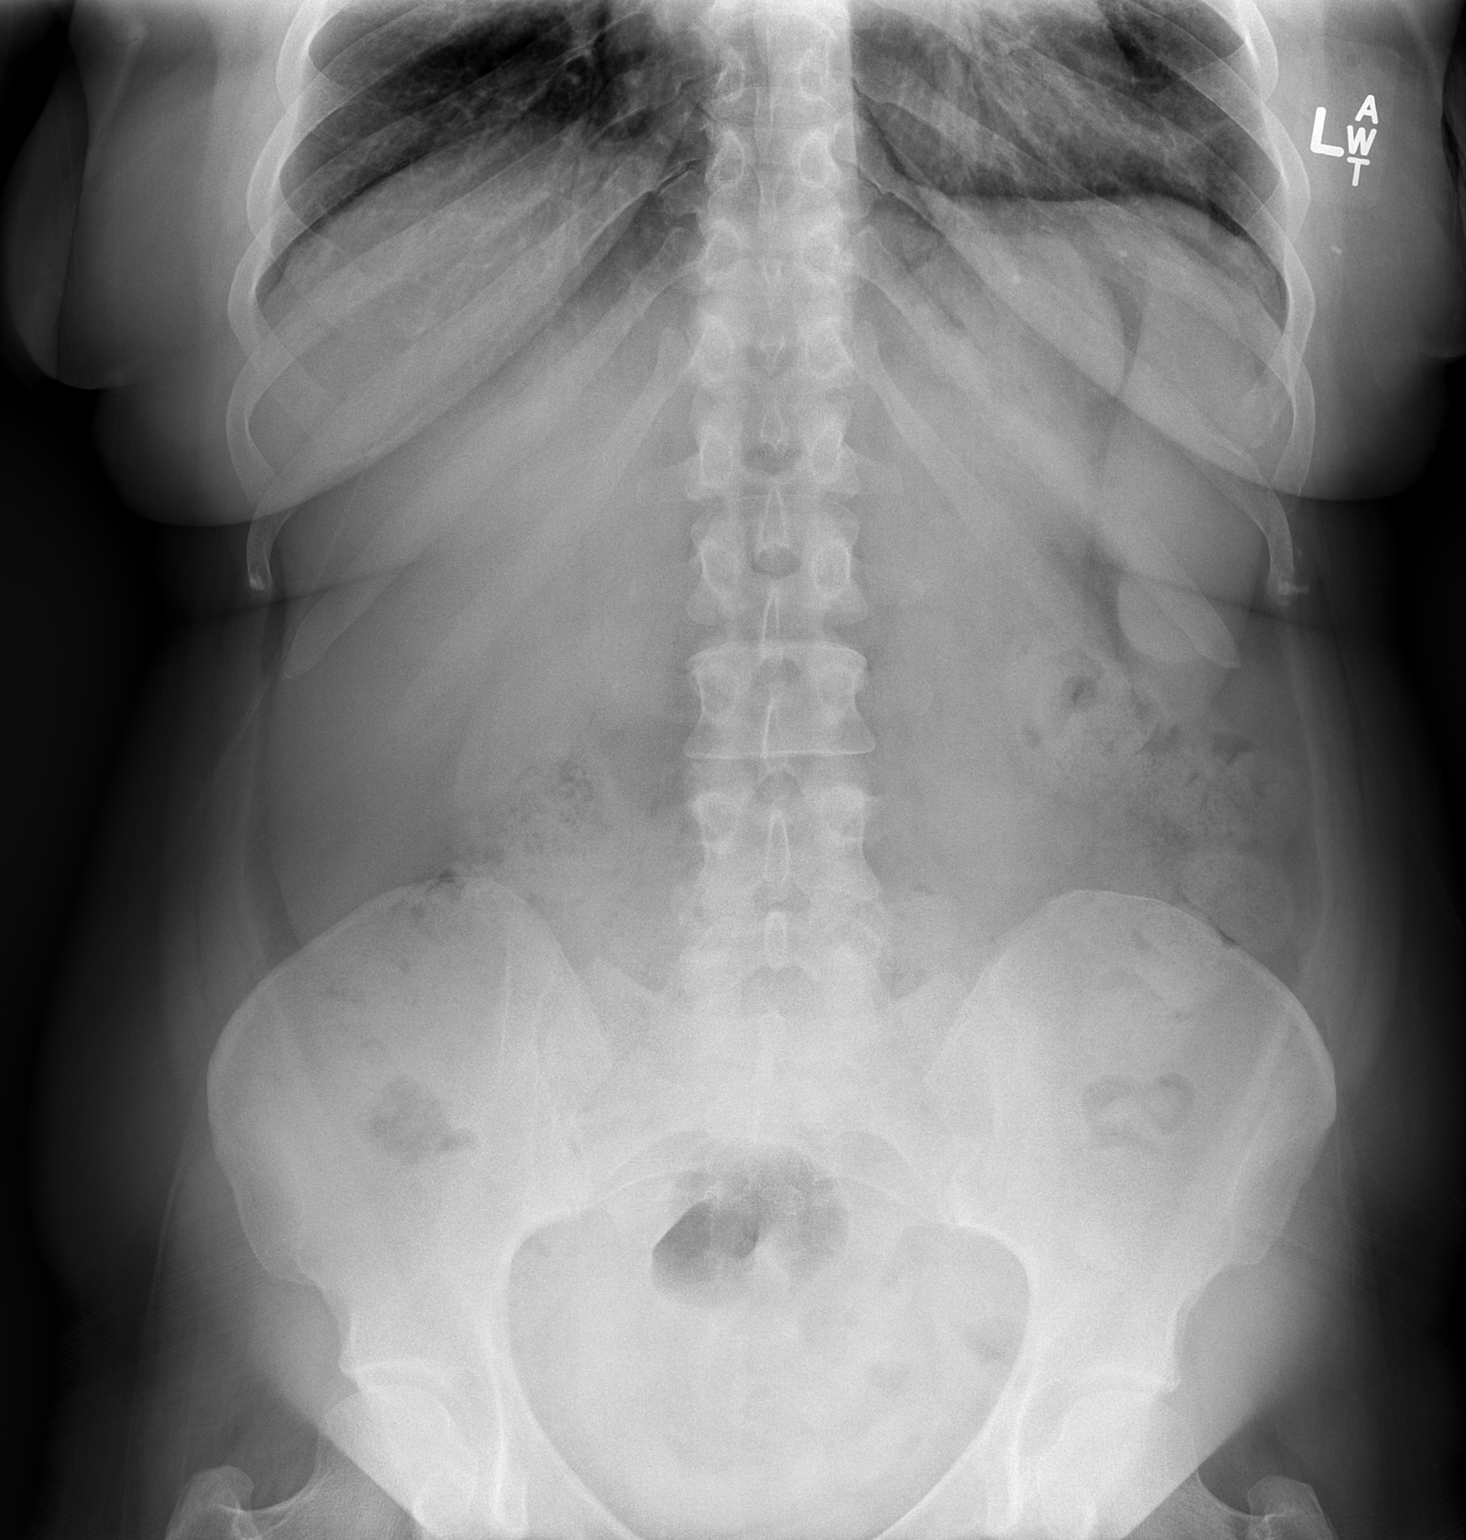

[t abdomen supine]
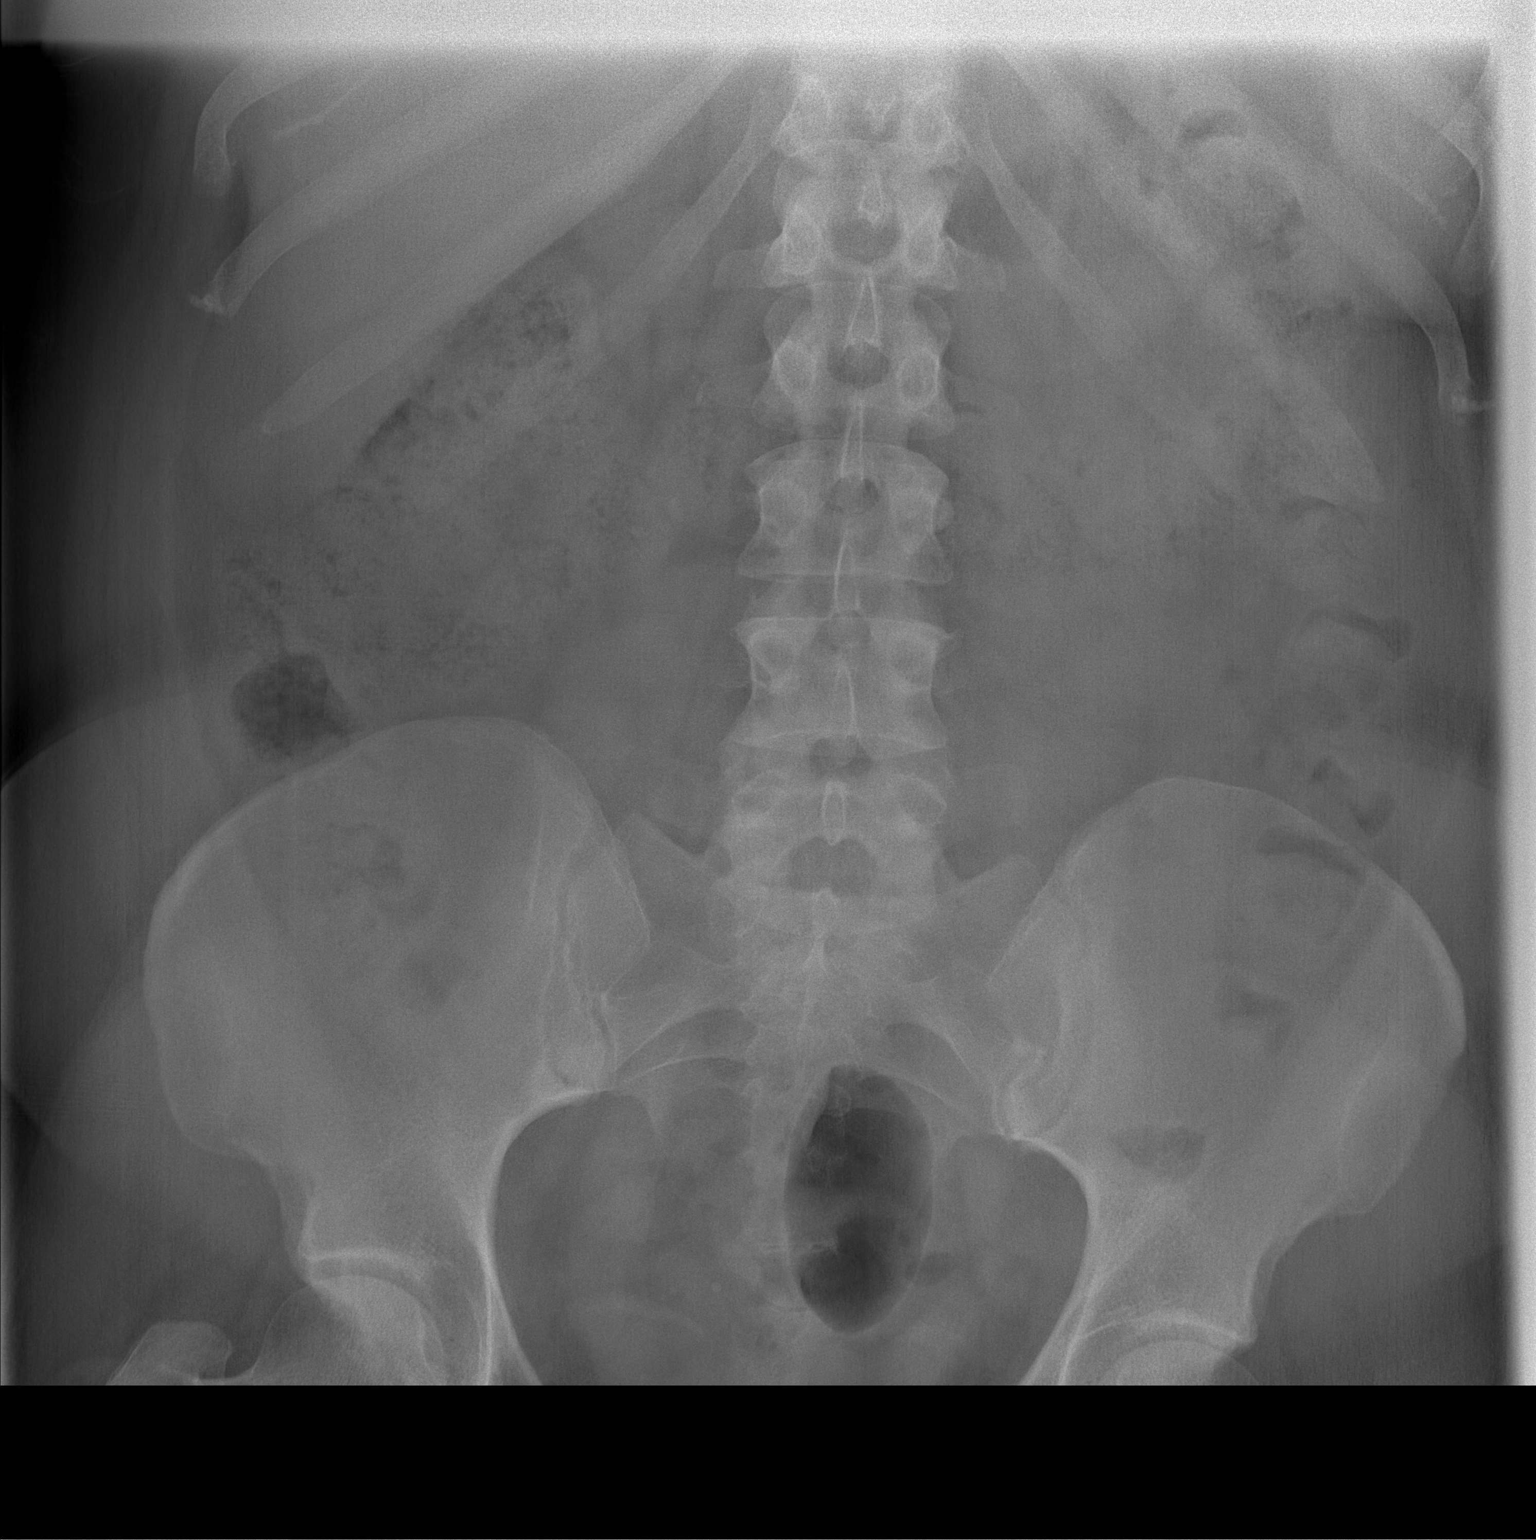

[2 of 2 positions shown; findings below may reference images not displayed]

FINDINGS: Scattered stool throughout colon.

No bowel dilatation, bowel wall thickening or evidence of free air.

Lung bases clear.

No acute osseous findings or urinary tract calcifications.
IMPRESSION: No acute abnormalities.

## 2016-11-01 ENCOUNTER — Encounter (HOSPITAL_COMMUNITY): Payer: Self-pay | Admitting: *Deleted

## 2016-11-15 ENCOUNTER — Other Ambulatory Visit (HOSPITAL_BASED_OUTPATIENT_CLINIC_OR_DEPARTMENT_OTHER): Payer: Medicaid Other

## 2016-11-15 DIAGNOSIS — C50412 Malignant neoplasm of upper-outer quadrant of left female breast: Secondary | ICD-10-CM

## 2016-11-15 LAB — COMPREHENSIVE METABOLIC PANEL
ALK PHOS: 57 U/L (ref 40–150)
ALT: 51 U/L (ref 0–55)
AST: 44 U/L — ABNORMAL HIGH (ref 5–34)
Albumin: 4 g/dL (ref 3.5–5.0)
Anion Gap: 13 mEq/L — ABNORMAL HIGH (ref 3–11)
BILIRUBIN TOTAL: 0.58 mg/dL (ref 0.20–1.20)
BUN: 9.4 mg/dL (ref 7.0–26.0)
CO2: 24 mEq/L (ref 22–29)
CREATININE: 1 mg/dL (ref 0.6–1.1)
Calcium: 10.5 mg/dL — ABNORMAL HIGH (ref 8.4–10.4)
Chloride: 106 mEq/L (ref 98–109)
EGFR: 67 mL/min/{1.73_m2} — ABNORMAL LOW (ref >90)
GLUCOSE: 116 mg/dL (ref 70–140)
Potassium: 3.3 mEq/L — ABNORMAL LOW (ref 3.5–5.1)
SODIUM: 143 meq/L (ref 136–145)
TOTAL PROTEIN: 6.8 g/dL (ref 6.4–8.3)

## 2016-11-15 LAB — CBC WITH DIFFERENTIAL/PLATELET
BASO%: 0.2 % (ref 0.0–2.0)
Basophils Absolute: 0 10*3/uL (ref 0.0–0.1)
EOS%: 3.8 % (ref 0.0–7.0)
Eosinophils Absolute: 0.3 10*3/uL (ref 0.0–0.5)
HCT: 40.9 % (ref 34.8–46.6)
HGB: 13.6 g/dL (ref 11.6–15.9)
LYMPH%: 23.4 % (ref 14.0–49.7)
MCH: 31.9 pg (ref 25.1–34.0)
MCHC: 33.3 g/dL (ref 31.5–36.0)
MCV: 95.8 fL (ref 79.5–101.0)
MONO#: 0.4 10*3/uL (ref 0.1–0.9)
MONO%: 4.7 % (ref 0.0–14.0)
NEUT%: 67.9 % (ref 38.4–76.8)
NEUTROS ABS: 5.5 10*3/uL (ref 1.5–6.5)
Platelets: 157 10*3/uL (ref 145–400)
RBC: 4.27 10*6/uL (ref 3.70–5.45)
RDW: 13.8 % (ref 11.2–14.5)
WBC: 8.1 10*3/uL (ref 3.9–10.3)
lymph#: 1.9 10*3/uL (ref 0.9–3.3)

## 2016-11-22 ENCOUNTER — Ambulatory Visit (HOSPITAL_BASED_OUTPATIENT_CLINIC_OR_DEPARTMENT_OTHER): Payer: Medicaid Other | Admitting: Oncology

## 2016-11-22 VITALS — BP 107/75 | HR 86 | Temp 98.5°F | Resp 18 | Ht 65.0 in | Wt 198.6 lb

## 2016-11-22 DIAGNOSIS — Z170 Estrogen receptor positive status [ER+]: Secondary | ICD-10-CM

## 2016-11-22 DIAGNOSIS — C50412 Malignant neoplasm of upper-outer quadrant of left female breast: Secondary | ICD-10-CM | POA: Diagnosis not present

## 2016-11-22 MED ORDER — TAMOXIFEN CITRATE 20 MG PO TABS: 20.0000 mg | ORAL_TABLET | Freq: Every day | ORAL | 2 refills | Status: DC

## 2016-11-22 NOTE — Progress Notes (Signed)
[facility] [place]  Telephone:([phone] Fax:(336) [phone]    ID: Jessica [name] DOB: Jul 04, 1965  MR#: [id number]  EHO#:[id number]  Patient Care Team: Jessica Petty, [name] as PCP - General (Family Medicine) Jessica Bookbinder, [name] as Consulting Physician (General Surgery) Jessica [name] as Consulting Physician (Oncology) Jessica Rudd, [name] as Consulting Physician (Radiation Oncology) Jessica Schools, [name] as Consulting Physician (Orthopedic Surgery)  OTHER: Jessica [name], Jessica Fendt [name]  CHIEF COMPLAINT: triple positive breast cancer  CURRENT TREATMENT: Tamoxifen  BREAST CANCER HISTORY: From the original intake note:  Jessica [name] herself palpated a mass in her left breast. She brought it to the attention of Jessica [name] at the health department and she was set up for unilateral left mammography and ultrasound 11/27/2013 at the [facility]. This showed a possible distortion in the left breast upper outer quadrant. However the patient's breasts are density category D. On exam there was a firm nodule or mass in the left breast 2:00 location which by ultrasound was irregular and hypoechoic and measured 1.5 cm. There was no left axillary lymphadenopathy noted.  Biopsy of the mass in question 11/29/2013 showed ([id number]) an invasive ductal carcinoma, grade 1, estrogen receptor 94% positive, progesterone receptor 94% positive, both with strong staining intensity, with an MIB-1 of 46%, and HER-2 amplified by CISH, the signals ratio of 4.85 and the number per cell 6.55.  On 12/08/2013 the patient underwent bilateral breast MRI. This showed in the left breast upper outer quadrant a 2.9 cm irregular enhancing mass and extending posteriorly from this an area of clumped non-masslike enhancement extending approximately 6 cm and leading to a posterior group of nodules which were small but measured in aggregate 2.2 cm. In addition, there was a separate 0.8 cm irregular spiculated  enhancing mass in the upper inner aspect of the left breast. In the lower outer left breast there was a 0.8 cm oval enhancing mass. There were no abnormal appearing lymph nodes.  [name] subsequent history is as detailed below  INTERVAL HISTORY: Jessica [name] returns today for follow up of her triple positive breast cancer accompanied by her significant other, Jessica [name].. Since her last visit here she underwent bilateral oophorectomy 12/15/2015. The final pathology ([id number]) showed no evidence of malignancy.  She continues on tamoxifen, which she generally tolerates well. Vaginal wetness and hot flashes are not major concerns. She continues on venlafaxine for the hot flashes. Both that medication on her tamoxifen for refill today  REVIEW OF SYSTEMS: Jessica [name] continues to have pains in multiple joints, which are very bothersome to her. She is on gabapentin for this. I know occasionally uses over-the-counter analgesics. A detailed review of systems today was otherwise stable  PAST MEDICAL HISTORY: Past Medical History:  Diagnosis Date  . Anxiety   . Asthma    denies history of asthma  . Breast cancer ([place]) 05/29/14   left breast Lumpectomy=Invasive ductal Carcinoma  . 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 Chronic heartburn   . COPD (chronic obstructive pulmonary disease) ([place]) 2011   does not use inhaler ofter  . Depression   . Family history of anesthesia complication    Had a hard time waking father up only once after several procedures  . GERD (gastroesophageal reflux disease)   . Heart murmur    PMH: at age 4; no longer have murmur  . Hot flashes   . PVC (premature ventricular contraction)    PMH;at 51 y.o.  Marland Kitchen Shortness of breath  with exertion    PAST SURGICAL HISTORY: Past Surgical History:  Procedure Laterality Date  . ABDOMINAL HYSTERECTOMY    . BREAST SURGERY     left breast biopsy x 2  . CYSTOSCOPY N/A 05/24/2013   Procedure: CYSTOSCOPY;  Surgeon: Lavonia Drafts, [name];   Location: [facility];  Service: Gynecology;  Laterality: N/A;  . LAPAROSCOPIC SALPINGO OOPHERECTOMY Bilateral 12/15/2015   Procedure: LAPAROSCOPIC  BILATERAL  OOPHORECTOMY;  Surgeon: Lavonia Drafts, [name];  Location: [facility];  Service: Gynecology;  Laterality: Bilateral;  . polyp removal     . PORTACATH PLACEMENT Right 01/02/2014   Procedure: INSERTION PORT-A-CATH;  Surgeon: Jessica Bookbinder, [name];  Location: [facility];  Service: General;  Laterality: Right;  . portacath removal    . RADIOACTIVE SEED GUIDED MASTECTOMY WITH AXILLARY SENTINEL LYMPH NODE BIOPSY Left 05/29/2014   Procedure: RADIOACTIVE SEED GUIDED LEFT PARTIAL MASTECTOMY WITH AXILLARY SENTINEL LYMPH NODE BIOPSY;  Surgeon: Jessica Bookbinder, [name];  Location: [facility];  Service: General;  Laterality: Left;  . ROBOTIC ASSISTED TOTAL HYSTERECTOMY WITH BILATERAL SALPINGO OOPHERECTOMY Bilateral 05/24/2013   Procedure: ROBOTIC ASSISTED TOTAL HYSTERECTOMY WITH BILATERAL SALPINGECTOMY;  Surgeon: Lavonia Drafts, [name];  Location: [facility];  Service: Gynecology;  Laterality: Bilateral;  . TONSILLECTOMY    . WISDOM TOOTH EXTRACTION      FAMILY HISTORY Family History  Problem Relation Age of Onset  . Heart disease Mother   . Hypercholesterolemia Mother   . Heart disease Father   . COPD Father   . Hypercholesterolemia Father   . Cancer Neg Hx   [name] parents are still living, her father is 29 years old and her mother 82 years old as of August 2015. The patient had no brothers, one sister. There is no history of breast or ovarian cancer in the family.  GYNECOLOGIC HISTORY:  Patient's last menstrual period was 03/23/2013. Menarche age 16. She is GX P0. She took oral contraceptives for many years as well as Depo Provera shots. She is status post hysterectomy with bilateral salpingectomy. 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 She scans at work, and she also does all the  shelving and stocking. She is single but for the last 11 years as lives with her significant other Jessica [name]. He is disabled secondary to multiple orthopedic problems including bilateral avascular necrosis of the hips. He also had a R-body CVA January 2016. Both of them do smoke.     ADVANCED DIRECTIVES: Not in place    HEALTH MAINTENANCE: Social History  Substance Use Topics  . Smoking status: Current Every [name] Smoker    Packs/[name]: 1.00    Years: 16.00    Types: Cigarettes  . Smokeless tobacco: Never Used     Comment: 1/2ppd decreased  . Alcohol use 0.0 oz/week     Comment: occasional      Colonoscopy:  PAP: January 2015   Bone density:  Lipid panel:  No Known Allergies  Current Outpatient Prescriptions  Medication Sig Dispense Refill  . budesonide-formoterol (SYMBICORT) 160-4.5 MCG/ACT inhaler Inhale 1 puff into the lungs 2 (two) times daily as needed (for shortness of breath).     . calcium carbonate (TUMS - DOSED IN MG ELEMENTAL CALCIUM) 500 MG chewable tablet Chew 1 tablet by mouth daily as needed for heartburn.     . gabapentin (NEURONTIN) 300 MG capsule Take 2 capsules (600 mg total) by mouth 2 (two) times daily. [place]  capsule 1  . ipratropium (ATROVENT HFA) 17 MCG/ACT inhaler Inhale 1 puff into the lungs every 6 (six) hours as needed for wheezing (SOB).     . LORazepam (ATIVAN) 0.5 MG tablet Take 0.5 mg by mouth every 8 (eight) hours.    Marland Kitchen oxyCODONE-acetaminophen (PERCOCET/ROXICET) 5-325 MG tablet Take 1 tablet by mouth every 6 (six) hours as needed. (Patient not taking: Reported on 01/13/2016) 10 tablet 0  . promethazine (PHENERGAN) 25 MG/ML injection Inject 0.25-0.5 mLs (6.25-12.5 mg total) into the vein every 15 (fifteen) minutes as needed for nausea. (Patient not taking: Reported on 01/13/2016) 1 mL 0  . tamoxifen (NOLVADEX) 20 MG tablet Take 1 tablet (20 mg total) by mouth daily. 90 tablet 2  . venlafaxine XR (EFFEXOR-XR) 75 MG 24 hr capsule Take 2 capsules (150 mg total)  by mouth daily with breakfast. 60 capsule 3  . zolpidem (AMBIEN) 5 MG tablet Take 1 tablet (5 mg total) by mouth at bedtime as needed for sleep. 90 tablet 3   No current facility-administered medications for this visit.     OBJECTIVE: middle-aged white woman In no acute distress There were no vitals filed for this visit.   There is no height or weight on file to calculate BMI.    ECOG FS:1 - Symptomatic but completely ambulatory  Sclerae unicteric, EOMs intact Oropharynx clear and moist No cervical or supraclavicular adenopathy Lungs no rales or rhonchi Heart regular rate and rhythm Abd soft, nontender, positive bowel sounds MSK no focal spinal tenderness, no upper extremity lymphedema Neuro: nonfocal, well oriented, appropriate affect Breasts: The right breast is unremarkable. The left breast is status post lumpectomy and radiation. There is no evidence of local recurrence. The left axilla is benign.    LAB RESULTS:  CMP     Component Value Date/Time   NA 143 11/15/2016 0933   K 3.3 (L) 11/15/2016 0933   CL 103 12/31/2013 1448   CO2 24 11/15/2016 0933   GLUCOSE 116 11/15/2016 0933   BUN 9.4 11/15/2016 0933   CREATININE 1.0 11/15/2016 0933   CALCIUM 10.5 (H) 11/15/2016 0933   PROT 6.8 11/15/2016 0933   ALBUMIN 4.0 11/15/2016 0933   AST 44 (H) 11/15/2016 0933   ALT 51 11/15/2016 0933   ALKPHOS 57 11/15/2016 0933   BILITOT 0.58 11/15/2016 0933   GFRNONAA 83 (L) 12/31/2013 1448   GFRAA >90 12/31/2013 1448    I No results found for: SPEP  Lab Results  Component Value Date   WBC 8.1 11/15/2016   NEUTROABS 5.5 11/15/2016   HGB 13.6 11/15/2016   HCT 40.9 11/15/2016   MCV 95.8 11/15/2016   PLT 157 11/15/2016      Chemistry      Component Value Date/Time   NA 143 11/15/2016 0933   K 3.3 (L) 11/15/2016 0933   CL 103 12/31/2013 1448   CO2 24 11/15/2016 0933   BUN 9.4 11/15/2016 0933   CREATININE 1.0 11/15/2016 0933      Component Value Date/Time   CALCIUM 10.5  (H) 11/15/2016 0933   ALKPHOS 57 11/15/2016 0933   AST 44 (H) 11/15/2016 0933   ALT 51 11/15/2016 0933   BILITOT 0.58 11/15/2016 0933       No results found for: LABCA2  No components found for: LABCA125  No results for input(s): INR in the last 168 hours.  Urinalysis    Component Value Date/Time   COLORURINE YELLOW 06/04/2013 1420   APPEARANCEUR HAZY (A) 06/04/2013 1420  LABSPEC 1.030 01/27/2014 1350   PHURINE 5.0 01/27/2014 1350   PHURINE 6.0 06/04/2013 1420   GLUCOSEU Negative 01/27/2014 1350   HGBUR Negative 01/27/2014 1350   HGBUR MODERATE (A) 06/04/2013 1420   HGBUR negative 10/07/2009 1044   BILIRUBINUR Color Interference 01/27/2014 1350   KETONESUR Color Interference 01/27/2014 1350   KETONESUR NEGATIVE 06/04/2013 1420   PROTEINUR Color Interference 01/27/2014 1350   PROTEINUR NEGATIVE 06/04/2013 1420   UROBILINOGEN Color Interference 01/27/2014 1350   NITRITE Color Interference 01/27/2014 1350   NITRITE NEGATIVE 06/04/2013 1420   LEUKOCYTESUR Color Interference 01/27/2014 1350    STUDIES: CLINICAL DATA:  Pelvic pain.  Status post hysterectomy.  EXAM: TRANSABDOMINAL AND TRANSVAGINAL ULTRASOUND OF PELVIS  TECHNIQUE: Both transabdominal and transvaginal ultrasound examinations of the pelvis were performed. Transabdominal technique was performed for global imaging of the pelvis including uterus, ovaries, adnexal regions, and pelvic cul-de-sac. It was necessary to proceed with endovaginal exam following the transabdominal exam to visualize the endometrium and ovaries.  COMPARISON:  None  FINDINGS: Uterus  Measurements: Previous hysterectomy.  Endometrium  Thickness:  not applicable.  Right ovary  Measurements: 3.8 x 1.9 x 3.1 cm. Normal appearance/no adnexal mass.  Left ovary  Measurements: 3.4 x 1.8 x 2.8 cm. Normal appearance/no adnexal mass.  Other findings  No abnormal free fluid.  IMPRESSION: Previous  hysterectomy.  1. No acute findings to explain patient's pelvic pain   Electronically Signed   By: Kerby Moors M.D.   On: 11/06/2015 11:05  No results found.  ASSESSMENT: 51 y.o. BRCA negative Max woman status post left breast upper outer quadrant biopsy 11/29/2013 for a clinical T2 N0, stage IIA invasive ductal carcinoma, grade 1, triple positive, with an MIB-1 of 46%.  (1) MRI-guided biopsy of two additional areas in the left breast showed only tubular adenomas, no malignancy.   (2) neoadjuvant chemotherapy started 01/14/2014, consisting of carboplatin, docetaxel, trastuzumab and pertuzumab given every 21 days for 6 cycles, with Neulasta on [name] 2. Completed 04/30/14  (3) trastuzumab continued to total one year, last dose 01/06/2015  (a) most recent echo 10/06/2014 shows a well-preserved ejection fraction  (4) status post left lumpectomy with axillary sentinel lymph node biopsy on 05/29/2014 showing a residual ypT1c ypN0 invasive ductal carcinoma, grade 2, with negative margins   (5) adjuvant radiation completed 08/27/2014  (6) started tamoxifen 09/30/2014  (a) normal bone density scan 08/28/2014  (b) status post remote total abdominal hysterectomy with bilateral salpingo-oophorectomy  (7) abnormal radiotracer uptake in distal left femur evaluated with CT-guided biopsy 06/20/2014, showing enchondroma, with low cellularity, no mitotic figures, and no atypia.  (8) persistent low pelvic discomfort, with unremarkable transvaginal ultrasonography 12/12/2013 and CT of the abdomen and pelvis 08/29/2014   PLAN: Iviona is now a year and a half from definitive surgery for her breast cancer with no evidence of disease recurrence. This is very favorable.  She continues on tamoxifen with well-controlled symptoms. The plan will be to continue that for a minimum of 5 years, potentially 10.  At this point I am comfortable seeing her on a once a year basis. She knows to call for any  problems that may develop before her next visit here.   [name], [name]   11/22/2016 8:40 AM

## 2016-11-22 NOTE — Progress Notes (Signed)
[facility]  Telephone:([phone]) [phone] Fax:([phone]) [phone]    ID: [name] DOB: 1965-10-05  MR#: [id number]  ENL#:[id number]  Patient Care Team: Loyal Jacobson, MD as PCP - General (Family Medicine) Emelia Loron, MD as Consulting Physician (General Surgery) Magrinat, Valentino Hue, MD as Consulting Physician (Oncology) Dorothy Puffer, MD as Consulting Physician (Radiation Oncology) Venita Lick, MD as Consulting Physician (Orthopedic Surgery)  OTHER: Venita Lick MD, Iverson Alamin MD  CHIEF COMPLAINT: triple positive breast cancer  CURRENT TREATMENT: Tamoxifen  BREAST CANCER HISTORY: From the original intake note:  Jessica [name] herself palpated a mass in her left breast. She brought it to the attention of Jessica [name] at the health department and she was set up for unilateral left mammography and ultrasound 11/27/2013 at the breast Center. This showed a possible distortion in the left breast upper outer quadrant. However the patient's breasts are density category D. On exam there was a firm nodule or mass in the left breast 2:00 location which by ultrasound was irregular and hypoechoic and measured 1.5 cm. There was no left axillary lymphadenopathy noted.  Biopsy of the mass in question 11/29/2013 showed ([id number]) an invasive ductal carcinoma, grade 1, estrogen receptor 94% positive, progesterone receptor 94% positive, both with strong staining intensity, with an MIB-1 of 46%, and HER-2 amplified by CISH, the signals ratio of 4.85 and the number per cell 6.55.  On 12/08/2013 the patient underwent bilateral breast MRI. This showed in the left breast upper outer quadrant a 2.9 cm irregular enhancing mass and extending posteriorly from this an area of clumped non-masslike enhancement extending approximately 6 cm and leading to a posterior group of nodules which were small but measured in aggregate 2.2 cm. In addition, there was a separate 0.8 cm irregular spiculated  enhancing mass in the upper inner aspect of the left breast. In the lower outer left breast there was a 0.8 cm oval enhancing mass. There were no abnormal appearing lymph nodes.  [name] subsequent history is as detailed below  INTERVAL HISTORY: Jessica [name] returns today for follow-up and treatment of her estrogen receptor positive breast cancer accompanied by her former significant other and now husband, Jessica [name].--They married recently, have purchased a home at [facility], and have a rescue dogs. Jessica [name] is very happy with all these developments.  She is tolerating tamoxifen well, with some hot flashes as the main issue. Vaginal wetness is not a major problem. She obtains a drug at a good price.  REVIEW OF SYSTEMS: Jessica [name] has noted a change in her left armpit which she wants me to look at. She is very concerned regarding this. She has also had less energy recently. They have been no intercurrent fevers, bleeding, rash, and her weight is actually up a little. A detailed review of systems today was otherwise stable  PAST MEDICAL HISTORY: Past Medical History:  Diagnosis Date  . Anxiety   . Asthma    denies history of asthma  . Breast cancer (HCC) 05/29/14   left breast Lumpectomy=Invasive ductal Carcinoma  . Cancer (HCC)    DCIS  . Chronic heartburn   . COPD (chronic obstructive pulmonary disease) (HCC) 2011   does not use inhaler ofter  . Depression   . Family history of anesthesia complication    Had a hard time waking father up only once after several procedures  . GERD (gastroesophageal reflux disease)   . Heart murmur    PMH: at age 77; no longer have murmur  . Hot  flashes   . PVC (premature ventricular contraction)    PMH;at 51 y.o.  Marland Kitchen Shortness of breath    with exertion    PAST SURGICAL HISTORY: Past Surgical History:  Procedure Laterality Date  . ABDOMINAL HYSTERECTOMY    . BREAST SURGERY     left breast biopsy x 2  . CYSTOSCOPY N/A 05/24/2013   Procedure: CYSTOSCOPY;   Surgeon: Lavonia Drafts, MD;  Location: [facility];  Service: Gynecology;  Laterality: N/A;  . LAPAROSCOPIC SALPINGO OOPHERECTOMY Bilateral 12/15/2015   Procedure: LAPAROSCOPIC  BILATERAL  OOPHORECTOMY;  Surgeon: Lavonia Drafts, MD;  Location: [facility];  Service: Gynecology;  Laterality: Bilateral;  . polyp removal     . PORTACATH PLACEMENT Right 01/02/2014   Procedure: INSERTION PORT-A-CATH;  Surgeon: [name], MD;  Location: [facility];  Service: General;  Laterality: Right;  . portacath removal    . RADIOACTIVE SEED GUIDED MASTECTOMY WITH AXILLARY SENTINEL LYMPH NODE BIOPSY Left 05/29/2014   Procedure: RADIOACTIVE SEED GUIDED LEFT PARTIAL MASTECTOMY WITH AXILLARY SENTINEL LYMPH NODE BIOPSY;  Surgeon: [name], MD;  Location: [place];  Service: General;  Laterality: Left;  . ROBOTIC ASSISTED TOTAL HYSTERECTOMY WITH BILATERAL SALPINGO OOPHERECTOMY Bilateral 05/24/2013   Procedure: ROBOTIC ASSISTED TOTAL HYSTERECTOMY WITH BILATERAL SALPINGECTOMY;  Surgeon: Lavonia Drafts, MD;  Location: [facility];  Service: Gynecology;  Laterality: Bilateral;  . TONSILLECTOMY    . WISDOM TOOTH EXTRACTION      FAMILY HISTORY Family History  Problem Relation Age of Onset  . Heart disease Mother   . Hypercholesterolemia Mother   . Heart disease Father   . COPD Father   . Hypercholesterolemia Father   . Cancer Neg Hx   [name] parents are still living, her father is 3 years old and her mother 44 years old as of August 2015. The patient had no brothers, one sister. There is no history of breast or ovarian cancer in the family.  GYNECOLOGIC HISTORY:  Patient's last menstrual period was 03/23/2013. Menarche age 47. She is GX P0. She took oral contraceptives for many years as well as Depo Provera shots. She is status post hysterectomy with bilateral salpingectomy. 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 She  scans at work, and she also does all the shelving and stocking. She is single but for the last 11 years as lives with her significant other [name]. He is disabled secondary to multiple orthopedic problems including bilateral avascular necrosis of the hips. He also had a R-body CVA January 2016. Both of them do smoke.     ADVANCED DIRECTIVES: Not in place    HEALTH MAINTENANCE: Social History  Substance Use Topics  . Smoking status: Current Every Day Smoker    Packs/day: 1.00    Years: 16.00    Types: Cigarettes  . Smokeless tobacco: Never Used     Comment: 1/2ppd decreased  . Alcohol use 0.0 oz/week     Comment: occasional      Colonoscopy:  PAP: January 2015   Bone density:  Lipid panel:  No Known Allergies  Current Outpatient Prescriptions  Medication Sig Dispense Refill  . budesonide-formoterol (SYMBICORT) 160-4.5 MCG/ACT inhaler Inhale 1 puff into the lungs 2 (two) times daily as needed (for shortness of breath).     . calcium carbonate (TUMS - DOSED IN MG ELEMENTAL CALCIUM) 500 MG chewable tablet Chew 1 tablet by mouth daily as needed for heartburn.     Marland Kitchen  gabapentin (NEURONTIN) 300 MG capsule Take 2 capsules (600 mg total) by mouth 2 (two) times daily. 120 capsule 1  . ipratropium (ATROVENT HFA) 17 MCG/ACT inhaler Inhale 1 puff into the lungs every 6 (six) hours as needed for wheezing (SOB).     . LORazepam (ATIVAN) 0.5 MG tablet Take 0.5 mg by mouth every 8 (eight) hours.    Marland Kitchen oxyCODONE-acetaminophen (PERCOCET/ROXICET) 5-325 MG tablet Take 1 tablet by mouth every 6 (six) hours as needed. (Patient not taking: Reported on 01/13/2016) 10 tablet 0  . promethazine (PHENERGAN) 25 MG/ML injection Inject 0.25-0.5 mLs (6.25-12.5 mg total) into the vein every 15 (fifteen) minutes as needed for nausea. (Patient not taking: Reported on 01/13/2016) 1 mL 0  . tamoxifen (NOLVADEX) 20 MG tablet Take 1 tablet (20 mg total) by mouth daily. 90 tablet 2  . venlafaxine XR (EFFEXOR-XR) 75 MG 24 hr  capsule Take 2 capsules (150 mg total) by mouth daily with breakfast. 60 capsule 3  . zolpidem (AMBIEN) 5 MG tablet Take 1 tablet (5 mg total) by mouth at bedtime as needed for sleep. 90 tablet 3   No current facility-administered medications for this visit.     OBJECTIVE: middle-aged white woman Who appears well Vitals:   11/22/16 1135  BP: 107/75  Pulse: 86  Resp: 18  Temp: 98.5 F (36.9 C)     Body mass index is 33.05 kg/m.    ECOG FS:1 - Symptomatic but completely ambulatory  Sclerae unicteric, pupils round and equal Oropharynx clear and moist No cervical or supraclavicular adenopathy Lungs no rales or rhonchi Heart regular rate and rhythm Abd soft, nontender, positive bowel sounds MSK no focal spinal tenderness, no upper extremity lymphedema Neuro: nonfocal, well oriented, appropriate affect Breasts: The right breast is benign. The left breast as undergone lumpectomy and radiation with no evidence of local recurrence. I do not palpate any suspicious finding in the area of concern in the left axilla.     LAB RESULTS:  CMP     Component Value Date/Time   NA 143 11/15/2016 0933   K 3.3 (L) 11/15/2016 0933   CL 103 12/31/2013 1448   CO2 24 11/15/2016 0933   GLUCOSE 116 11/15/2016 0933   BUN 9.4 11/15/2016 0933   CREATININE 1.0 11/15/2016 0933   CALCIUM 10.5 (H) 11/15/2016 0933   PROT 6.8 11/15/2016 0933   ALBUMIN 4.0 11/15/2016 0933   AST 44 (H) 11/15/2016 0933   ALT 51 11/15/2016 0933   ALKPHOS 57 11/15/2016 0933   BILITOT 0.58 11/15/2016 0933   GFRNONAA 83 (L) 12/31/2013 1448   GFRAA >90 12/31/2013 1448    I No results found for: SPEP  Lab Results  Component Value Date   WBC 8.1 11/15/2016   NEUTROABS 5.5 11/15/2016   HGB 13.6 11/15/2016   HCT 40.9 11/15/2016   MCV 95.8 11/15/2016   PLT 157 11/15/2016      Chemistry      Component Value Date/Time   NA 143 11/15/2016 0933   K 3.3 (L) 11/15/2016 0933   CL 103 12/31/2013 1448   CO2 24 11/15/2016  0933   BUN 9.4 11/15/2016 0933   CREATININE 1.0 11/15/2016 0933      Component Value Date/Time   CALCIUM 10.5 (H) 11/15/2016 0933   ALKPHOS 57 11/15/2016 0933   AST 44 (H) 11/15/2016 0933   ALT 51 11/15/2016 0933   BILITOT 0.58 11/15/2016 0933       No results found for: LABCA2  No components found for: LABCA125  No results for input(s): INR in the last 168 hours.  Urinalysis    Component Value Date/Time   COLORURINE YELLOW 06/04/2013 1420   APPEARANCEUR HAZY (A) 06/04/2013 1420   LABSPEC 1.030 01/27/2014 1350   PHURINE 5.0 01/27/2014 1350   PHURINE 6.0 06/04/2013 1420   GLUCOSEU Negative 01/27/2014 1350   HGBUR Negative 01/27/2014 1350   HGBUR MODERATE (A) 06/04/2013 1420   HGBUR negative 10/07/2009 1044   BILIRUBINUR Color Interference 01/27/2014 1350   KETONESUR Color Interference 01/27/2014 1350   KETONESUR NEGATIVE 06/04/2013 1420   PROTEINUR Color Interference 01/27/2014 1350   PROTEINUR NEGATIVE 06/04/2013 1420   UROBILINOGEN Color Interference 01/27/2014 1350   NITRITE Color Interference 01/27/2014 1350   NITRITE NEGATIVE 06/04/2013 1420   LEUKOCYTESUR Color Interference 01/27/2014 1350    STUDIES: Most recent mammography April 11, 2016 followed no abnormality or evidence of disease in either breast  ASSESSMENT: 51 y.o. BRCA negative [place] woman status post left breast upper outer quadrant biopsy 11/29/2013 for a clinical T2 N0, stage IIA invasive ductal carcinoma, grade 1, triple positive, with an MIB-1 of 46%.  (1) MRI-guided biopsy of two additional areas in the left breast showed only tubular adenomas, no malignancy.   (2) neoadjuvant chemotherapy started 01/14/2014, consisting of carboplatin, docetaxel, trastuzumab and pertuzumab given every 21 days for 6 cycles, with Neulasta on day 2. Completed 04/30/14  (3) trastuzumab continued to total one year, last dose 01/06/2015  (a) most recent echo 10/06/2014 shows a well-preserved ejection  fraction  (4) status post left lumpectomy with axillary sentinel lymph node biopsy on 05/29/2014 showing a residual ypT1c ypN0 invasive ductal carcinoma, grade 2, with negative margins   (5) adjuvant radiation completed 08/27/2014  (6) started tamoxifen 09/30/2014  (a) normal bone scan 08/28/2014  (b) status post remote hysterectomy with bilateral salpingo-oophorectomy  (7) abnormal radiotracer uptake in distal left femur evaluated with CT-guided biopsy 06/20/2014, showing enchondroma, with low cellularity, no mitotic figures, and no atypia.  (8) persistent low pelvic discomfort, with unremarkable transvaginal ultrasonography 12/12/2013 and CT of the abdomen and pelvis 08/29/2014  PLAN: Cale is now a little over 2 years out from definitive surgery for her breast cancer with no evidence of disease recurrence. This is very favorable.  She is tolerating tamoxifen well and the plan will be to continue that for a total of 5 years.  I think the exam of her left breast and left axilla today is reassuring but she is very concerned about it so I am setting her up for mammography and ultrasonography of the left breast and axilla within the next week. If there is any abnormality found of course we will proceed to biopsy  Otherwise she will see [place] again in January, after her 04-12-23 mammography. She'll will then see me again in late August of next year  I congratulated her an [name] on the recent marriage and wished her a long and happy life    [name], MD   11/22/2016 11:44 AM

## 2016-11-25 ENCOUNTER — Ambulatory Visit
Admission: RE | Admit: 2016-11-25 | Discharge: 2016-11-25 | Disposition: A | Discharge status: Home / Self Care | Payer: Medicaid Other | Source: Ambulatory Visit | Attending: Oncology | Admitting: Oncology

## 2016-11-25 DIAGNOSIS — C50412 Malignant neoplasm of upper-outer quadrant of left female breast: Secondary | ICD-10-CM

## 2016-11-25 DIAGNOSIS — Z170 Estrogen receptor positive status [ER+]: Principal | ICD-10-CM

## 2017-01-19 ENCOUNTER — Other Ambulatory Visit: Payer: Self-pay | Admitting: Oncology

## 2017-01-19 ENCOUNTER — Telehealth: Payer: Self-pay

## 2017-01-19 MED ORDER — TAMOXIFEN CITRATE 20 MG PO TABS: 20.0000 mg | ORAL_TABLET | Freq: Every day | ORAL | 2 refills | Status: DC

## 2017-01-19 NOTE — Telephone Encounter (Signed)
Tamoxifen 20 mg refill sent to new pharmacy:  [facility].

## 2017-01-23 ENCOUNTER — Telehealth: Payer: Self-pay | Admitting: *Deleted

## 2017-01-23 NOTE — Telephone Encounter (Addendum)
Fax message received from [facility] for refill request of Gabapentin. Message sent to Dr. Ihor Dow for refill order.  I called pt and left message on her personal voicemail stating that we are working on obtaining the refill she has requested. She will be notified when we have additional information.   Called pt and left message on her personal voicemail stating that Dr. Ihor Dow has prescribed a 1 month supply of the gabapentin and 1 additional refill. Pt was also advised that she needs to schedule annual Gyn exam within the next 2 months. She should call the office for appointment and may call back if she has questions.

## 2017-01-24 ENCOUNTER — Other Ambulatory Visit: Payer: Self-pay | Admitting: Obstetrics & Gynecology

## 2017-01-24 DIAGNOSIS — N951 Menopausal and female climacteric states: Secondary | ICD-10-CM

## 2017-01-24 MED ORDER — GABAPENTIN 300 MG PO CAPS: 600.0000 mg | ORAL_CAPSULE | Freq: Two times a day (BID) | ORAL | 1 refills | Status: AC

## 2017-02-21 ENCOUNTER — Other Ambulatory Visit: Payer: Self-pay | Admitting: Oncology

## 2017-02-21 DIAGNOSIS — Z853 Personal history of malignant neoplasm of breast: Secondary | ICD-10-CM

## 2017-03-29 ENCOUNTER — Ambulatory Visit
Admission: RE | Admit: 2017-03-29 | Discharge: 2017-03-29 | Disposition: A | Discharge status: Home / Self Care | Payer: Medicaid Other | Source: Ambulatory Visit | Attending: Oncology | Admitting: Oncology

## 2017-03-29 DIAGNOSIS — Z853 Personal history of malignant neoplasm of breast: Secondary | ICD-10-CM

## 2017-03-29 HISTORY — DX: Personal history of irradiation: Z92.3

## 2017-05-02 ENCOUNTER — Inpatient Hospital Stay: Payer: Medicaid Other | Attending: Adult Health | Admitting: Adult Health

## 2017-05-02 ENCOUNTER — Inpatient Hospital Stay: Payer: Medicaid Other

## 2017-05-02 ENCOUNTER — Telehealth: Payer: Self-pay | Admitting: Adult Health

## 2017-05-02 ENCOUNTER — Encounter: Payer: Self-pay | Admitting: Adult Health

## 2017-05-02 VITALS — BP 128/81 | HR 84 | Temp 98.3°F | Resp 20 | Ht 65.0 in | Wt 200.3 lb

## 2017-05-02 DIAGNOSIS — Z170 Estrogen receptor positive status [ER+]: Secondary | ICD-10-CM

## 2017-05-02 DIAGNOSIS — C50412 Malignant neoplasm of upper-outer quadrant of left female breast: Secondary | ICD-10-CM

## 2017-05-02 DIAGNOSIS — Z79810 Long term (current) use of selective estrogen receptor modulators (SERMs): Secondary | ICD-10-CM | POA: Insufficient documentation

## 2017-05-02 DIAGNOSIS — Z9221 Personal history of antineoplastic chemotherapy: Secondary | ICD-10-CM | POA: Diagnosis not present

## 2017-05-02 DIAGNOSIS — Z720 Tobacco use: Secondary | ICD-10-CM | POA: Diagnosis not present

## 2017-05-02 DIAGNOSIS — Z923 Personal history of irradiation: Secondary | ICD-10-CM | POA: Diagnosis not present

## 2017-05-02 DIAGNOSIS — N951 Menopausal and female climacteric states: Secondary | ICD-10-CM | POA: Diagnosis not present

## 2017-05-02 LAB — COMPREHENSIVE METABOLIC PANEL
ALBUMIN: 4.1 g/dL (ref 3.5–5.0)
ALT: 16 U/L (ref 0–55)
AST: 13 U/L (ref 5–34)
Alkaline Phosphatase: 67 U/L (ref 40–150)
Anion gap: 8 (ref 3–11)
BUN: 12 mg/dL (ref 7–26)
CHLORIDE: 107 mmol/L (ref 98–109)
CO2: 27 mmol/L (ref 22–29)
Calcium: 9.4 mg/dL (ref 8.4–10.4)
Creatinine, Ser: 0.91 mg/dL (ref 0.60–1.10)
GFR calc Af Amer: >60 mL/min (ref >60)
GLUCOSE: 100 mg/dL (ref 70–140)
POTASSIUM: 4.2 mmol/L (ref 3.3–4.7)
Sodium: 142 mmol/L (ref 136–145)
TOTAL PROTEIN: 6.9 g/dL (ref 6.4–8.3)
Total Bilirubin: 0.4 mg/dL (ref 0.2–1.2)

## 2017-05-02 LAB — CBC WITH DIFFERENTIAL/PLATELET
BASOS ABS: 0.1 10*3/uL (ref 0.0–0.1)
Basophils Relative: 1 %
EOS ABS: 0.4 10*3/uL (ref 0.0–0.5)
EOS PCT: 6 %
HCT: 39.9 % (ref 34.8–46.6)
Hemoglobin: 13.5 g/dL (ref 11.6–15.9)
Lymphocytes Relative: 23 %
Lymphs Abs: 1.6 10*3/uL (ref 0.9–3.3)
MCH: 31.6 pg (ref 25.1–34.0)
MCHC: 33.9 g/dL (ref 31.5–36.0)
MCV: 93.2 fL (ref 79.5–101.0)
MONO ABS: 0.4 10*3/uL (ref 0.1–0.9)
MONOS PCT: 6 %
Neutro Abs: 4.5 10*3/uL (ref 1.5–6.5)
Neutrophils Relative %: 64 %
PLATELETS: 143 10*3/uL — AB (ref 145–400)
RBC: 4.28 MIL/uL (ref 3.70–5.45)
RDW: 13.3 % (ref 11.2–16.1)
WBC: 7 10*3/uL (ref 3.9–10.3)

## 2017-05-02 MED ORDER — TAMOXIFEN CITRATE 20 MG PO TABS: 20.0000 mg | ORAL_TABLET | Freq: Every day | ORAL | 2 refills | Status: DC

## 2017-05-02 NOTE — Telephone Encounter (Signed)
Scheduled appt per 1/15 los - sending confirmation letter in the mail with appts.

## 2017-05-02 NOTE — Progress Notes (Signed)
CLINIC:  Survivorship   REASON FOR VISIT:  Routine follow-up for history of breast cancer.   BRIEF ONCOLOGIC HISTORY:   Per Dr. Jana Hakim note from 11/22/2016:  52 y.o. BRCA negative [place] woman status post left breast upper outer quadrant biopsy 11/29/2013 for a clinical T2 N0, stage IIA invasive ductal carcinoma, grade 1, triple positive, with an MIB-1 of 46%.  (1) MRI-guided biopsy of two additional areas in the left breast showed only tubular adenomas, no malignancy.   (2) neoadjuvant chemotherapy started 01/14/2014, consisting of carboplatin, docetaxel, trastuzumab and pertuzumab given every 21 days for 6 cycles, with Neulasta on day 2. Completed 04/30/14  (3) trastuzumab continued to total one year, last dose 01/06/2015             (a) most recent echo 10/06/2014 shows a well-preserved ejection fraction  (4) status post left lumpectomy with axillary sentinel lymph node biopsy on 05/29/2014 showing a residual ypT1c ypN0 invasive ductal carcinoma, grade 2, with negative margins   (5) adjuvant radiation completed 08/27/2014  (6) started tamoxifen 09/30/2014             (a) normal bone scan 08/28/2014             (b) status post remote hysterectomy with bilateral salpingo-oophorectomy  (7) abnormal radiotracer uptake in distal left femur evaluated with CT-guided biopsy 06/20/2014, showing enchondroma, with low cellularity, no mitotic figures, and no atypia.  (8) persistent low pelvic discomfort, with unremarkable transvaginal ultrasonography 12/12/2013 and CT of the abdomen and pelvis 08/29/2014   INTERVAL HISTORY:  Jessica [name] presents to the [facility] today for routine follow-up for her history of breast cancer.  Overall, she reports feeling quite well. She is taking Tamoxifen daily and does note some hot flashes.  She takes Gabapentin and Effexor for the hot flashes.  Jessica [name] has continued pelvis discomfort.  She thinks it may be scar tissue from her surgery.   She did have ultrasound and CT a/p that was neg.  She says the pain has continued and hasn't worsened in any way shape or form.  No new symptoms with this pain as well.  She also has left axillary discomfort and under her breast.  This is continued since her surgery.  She has discussed this pain with Dr. Jana Hakim.    She has a PCP she sees, Antonieta Pert, NP at [facility].  She sees her regularly.  She needs a refill on her Tamoxifen today.      REVIEW OF SYSTEMS:  Review of Systems  Constitutional: Negative for appetite change, chills, fatigue, fever and unexpected weight change.  HENT:   Negative for hearing loss, lump/mass and trouble swallowing.   Eyes: Negative for eye problems and icterus.  Respiratory: Negative for chest tightness, cough and shortness of breath.   Gastrointestinal: Negative for abdominal distention, abdominal pain, constipation, diarrhea, nausea and vomiting.  Endocrine: Positive for hot flashes.  Genitourinary: Negative for difficulty urinating.   Musculoskeletal: Negative for arthralgias.  Skin: Negative for itching and rash.  Neurological: Negative for dizziness, extremity weakness and numbness.  Hematological: Negative for adenopathy. Does not bruise/bleed easily.  Psychiatric/Behavioral: Negative for depression. The patient is not nervous/anxious.   Breast: Denies any new nodularity, masses, tenderness, nipple changes, or nipple discharge.       PAST MEDICAL/SURGICAL HISTORY:  Past Medical History:  Diagnosis Date  . Anxiety   . Asthma    denies history of asthma  . Breast cancer ([place]) 05/29/14  left breast Lumpectomy=Invasive ductal Carcinoma  . 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 Chronic heartburn   . COPD (chronic obstructive pulmonary disease) ([place]) 2011   does not use inhaler ofter  . Depression   . Family history of anesthesia complication    Had a hard time waking father up only once after several procedures  . GERD (gastroesophageal  reflux disease)   . Heart murmur    PMH: at age 83; no longer have murmur  . Hot flashes   . Personal history of radiation therapy   . PVC (premature ventricular contraction)    PMH;at 52 y.o.  Marland Kitchen Shortness of breath    with exertion   Past Surgical History:  Procedure Laterality Date  . ABDOMINAL HYSTERECTOMY    . BREAST BIOPSY    . BREAST LUMPECTOMY Left    2016  . BREAST SURGERY     left breast biopsy x 2  . CYSTOSCOPY N/A 05/24/2013   Procedure: CYSTOSCOPY;  Surgeon: Lavonia Drafts, MD;  Location: [facility];  Service: Gynecology;  Laterality: N/A;  . LAPAROSCOPIC SALPINGO OOPHERECTOMY Bilateral 12/15/2015   Procedure: LAPAROSCOPIC  BILATERAL  OOPHORECTOMY;  Surgeon: Lavonia Drafts, MD;  Location: [facility];  Service: Gynecology;  Laterality: Bilateral;  . polyp removal     . PORTACATH PLACEMENT Right 01/02/2014   Procedure: INSERTION PORT-A-CATH;  Surgeon: [name], MD;  Location: [facility];  Service: General;  Laterality: Right;  . portacath removal    . RADIOACTIVE SEED GUIDED PARTIAL MASTECTOMY WITH AXILLARY SENTINEL LYMPH NODE BIOPSY Left 05/29/2014   Procedure: RADIOACTIVE SEED GUIDED LEFT PARTIAL MASTECTOMY WITH AXILLARY SENTINEL LYMPH NODE BIOPSY;  Surgeon: [name], MD;  Location: [facility];  Service: General;  Laterality: Left;  . ROBOTIC ASSISTED TOTAL HYSTERECTOMY WITH BILATERAL SALPINGO OOPHERECTOMY Bilateral 05/24/2013   Procedure: ROBOTIC ASSISTED TOTAL HYSTERECTOMY WITH BILATERAL SALPINGECTOMY;  Surgeon: Lavonia Drafts, MD;  Location: [facility];  Service: Gynecology;  Laterality: Bilateral;  . TONSILLECTOMY    . WISDOM TOOTH EXTRACTION       ALLERGIES:  No Known Allergies   CURRENT MEDICATIONS:  Outpatient Encounter Medications as of 05/02/2017  Medication Sig  . budesonide-formoterol (SYMBICORT) 160-4.5 MCG/ACT inhaler Inhale 1 puff into the lungs 2 (two) times daily as needed (for shortness of breath).   . calcium  carbonate (TUMS - DOSED IN MG ELEMENTAL CALCIUM) 500 MG chewable tablet Chew 1 tablet by mouth daily as needed for heartburn.   . gabapentin (NEURONTIN) 300 MG capsule Take 2 capsules (600 mg total) by mouth 2 (two) times daily.  Marland Kitchen oxyCODONE-acetaminophen (PERCOCET/ROXICET) 5-325 MG tablet Take 1 tablet by mouth every 6 (six) hours as needed.  . tamoxifen (NOLVADEX) 20 MG tablet Take 1 tablet (20 mg total) by mouth daily.  Marland Kitchen venlafaxine XR (EFFEXOR-XR) 75 MG 24 hr capsule Take 2 capsules (150 mg total) by mouth daily with breakfast.  . zolpidem (AMBIEN) 5 MG tablet Take 1 tablet (5 mg total) by mouth at bedtime as needed for sleep.   No facility-administered encounter medications on file as of 05/02/2017.      ONCOLOGIC FAMILY HISTORY:  Family History  Problem Relation Age of Onset  . Heart disease Mother   . Hypercholesterolemia Mother   . Heart disease Father   . COPD Father   . Hypercholesterolemia Father   . Cancer Neg Hx     GENETIC COUNSELING/TESTING: Underwent with Santiago Glad, negative  SOCIAL HISTORY:  Jessica [name] is married and lives  with her husband in [place], [place].  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  She denies any current of alcohol, or illicit drug use.  She current every day smoker. She has smoked for 18 years, 1ppd.     PHYSICAL EXAMINATION:  Vital Signs: Vitals:   05/02/17 1125  BP: 128/81  Pulse: 84  Resp: 20  Temp: 98.3 F (36.8 C)  SpO2: 97%   Filed Weights   05/02/17 1125  Weight: 200 lb 4.8 oz (90.9 kg)   General: Well-nourished, well-appearing female in no acute distress.  Accompanied by her cousin today.   HEENT: Head is normocephalic.  Pupils equal and reactive to light. Conjunctivae clear without exudate.  Sclerae anicteric. Oral mucosa is pink, moist.  Oropharynx is pink without lesions or erythema.  Lymph: No cervical, supraclavicular, or infraclavicular lymphadenopathy  noted on palpation.  Cardiovascular: Regular rate and rhythm.Marland Kitchen Respiratory: Clear to auscultation bilaterally. Chest expansion symmetric; breathing non-labored.  Breast Exam:  -Left breast: No appreciable masses on palpation. No skin redness, thickening, or peau d'orange appearance; no nipple retraction or nipple discharge; mild distortion in symmetry at previous lumpectomy site well healed scar without erythema or nodularity.  -Right breast: No appreciable masses on palpation. No skin redness, thickening, or peau d'orange appearance; no nipple retraction or nipple discharge;  -Axilla: No axillary adenopathy bilaterally.  GI: Abdomen soft and round; non-tender, non-distended. Bowel sounds normoactive. No hepatosplenomegaly.   GU: Deferred.  Neuro: No focal deficits. Steady gait.  Psych: Mood and affect normal and appropriate for situation.  MSK: No focal spinal tenderness to palpation, full range of motion in bilateral upper extremities Extremities: No edema. Skin: Warm and dry.  LABORATORY DATA:  Appointment on 05/02/2017  Component Date Value Ref Range Status  . WBC 05/02/2017 7.0  3.9 - 10.3 K/uL Final  . RBC 05/02/2017 4.28  3.70 - 5.45 MIL/uL Final  . Hemoglobin 05/02/2017 13.5  11.6 - 15.9 g/dL Final  . HCT 05/02/2017 39.9  34.8 - 46.6 % Final  . MCV 05/02/2017 93.2  79.5 - 101.0 fL Final  . MCH 05/02/2017 31.6  25.1 - 34.0 pg Final  . MCHC 05/02/2017 33.9  31.5 - 36.0 g/dL Final  . RDW 05/02/2017 13.3  11.2 - 16.1 % Final  . Platelets 05/02/2017 143* 145 - 400 K/uL Final  . Neutrophils Relative % 05/02/2017 64  % Final  . Neutro Abs 05/02/2017 4.5  1.5 - 6.5 K/uL Final  . Lymphocytes Relative 05/02/2017 23  % Final  . Lymphs Abs 05/02/2017 1.6  0.9 - 3.3 K/uL Final  . Monocytes Relative 05/02/2017 6  % Final  . Monocytes Absolute 05/02/2017 0.4  0.1 - 0.9 K/uL Final  . Eosinophils Relative 05/02/2017 6  % Final  . Eosinophils Absolute 05/02/2017 0.4  0.0 - 0.5 K/uL Final  .  Basophils Relative 05/02/2017 1  % Final  . Basophils Absolute 05/02/2017 0.1  0.0 - 0.1 K/uL Final   Performed at [facility], [place] [address], [place], [place] [address]  . Sodium 05/02/2017 142  136 - 145 mmol/L Final  . Potassium 05/02/2017 4.2  3.3 - 4.7 mmol/L Final  . Chloride 05/02/2017 107  98 - 109 mmol/L Final  . CO2 05/02/2017 27  22 - 29 mmol/L Final  . Glucose, Bld 05/02/2017 100  70 - 140 mg/dL Final  . BUN 05/02/2017 12  7 - 26  mg/dL Final  . Creatinine, Ser 05/02/2017 0.91  0.60 - 1.10 mg/dL Final  . Calcium 05/02/2017 9.4  8.4 - 10.4 mg/dL Final  . Total Protein 05/02/2017 6.9  6.4 - 8.3 g/dL Final  . Albumin 05/02/2017 4.1  3.5 - 5.0 g/dL Final  . AST 05/02/2017 13  5 - 34 U/L Final  . ALT 05/02/2017 16  0 - 55 U/L Final  . Alkaline Phosphatase 05/02/2017 67  40 - 150 U/L Final  . Total Bilirubin 05/02/2017 0.4  0.2 - 1.2 mg/dL Final  . GFR calc non Af Amer 05/02/2017 >60  >60 mL/min Final  . GFR calc Af Amer 05/02/2017 >60  >60 mL/min Final   Comment: (NOTE) The eGFR has been calculated using the CKD EPI equation. This calculation has not been validated in all clinical situations. eGFR's persistently <60 mL/min signify possible Chronic Kidney Disease.   Georgiann Hahn gap 05/02/2017 8  3 - 11 Final   Performed at [facility] : [facility], [place] [address], [place], [place] [address]     DIAGNOSTIC IMAGING:  Most recent mammogram:     ASSESSMENT AND PLAN:  Ms.. Jessica [name] is a pleasant 52 y.o. female with history of Stage IIA left breast invasive ductal carcinoma, ER+/PR+/HER2+, diagnosed in 11/2013, treated with neoadjuvant chemotherapy, lumpectomy, maintenance Herceptin, adjuvant radiation therapy, and anti-estrogen therapy with Tamoxifen beginning in 09/2014.  She [facility] for surveillance and routine follow-up.   1. History of breast cancer:  Jessica [name] is currently clinically and radiographically without  evidence of disease or recurrence of breast cancer. She will be due for mammogram in 03/2018; orders placed today.  She will continue her anti-estrogen therapy with Tamoxifen, with plans to continue for 10 years.  She will return to the cancer center to see her medical oncologist, Dr. Jana Hakim, in 10/2016.  I encouraged her to call me with any questions or concerns before her next visit at the cancer center, and I would be happy to see her sooner, if needed.    2. Bone health:  Given Ms. Mauney's age, history of breast cancer, she is at risk for bone demineralization.  She was counseled that Tamoxifen has a protective effect on her bones. She was given education on specific food and activities to promote bone health.  3. Cancer screening:  Due to Ms. Mauney's history and her age, she should receive screening for skin cancers, colon cancer, and gynecologic cancers. She was encouraged to follow-up with her PCP for appropriate cancer screenings.   4. Health maintenance and wellness promotion: Jessica [name] was encouraged to consume 5-7 servings of fruits and vegetables per day. She was also encouraged to engage in moderate to vigorous exercise for 30 minutes per day most days of the week. She was instructed to limit her alcohol consumption and was encouraged to quit smoking.   5. Current every day tobacco use:  Kora is not yet ready to quit.  She will let us know if she ever is, and I informed her that we would be happy to assist her with quitting.  She has an 18 pack year tobacco history.  She does not yet meet the age/pack year requirement for lung cancer smoking, but in 12 years she will, and I informed her of the requirements and information about lung cancer screening.      Dispo:  -Return to cancer center in 6 months for follow up with Dr. Jana Hakim -Mammogram due in 03/2018  A  total of (30) minutes of face-to-face time was spent with this patient with greater than 50% of that time in counseling and  care-coordination.   Gardenia Phlegm, NP [facility] [place] [phone]   Note: PRIMARY CARE PROVIDER Jefm Petty, [facility] [place] [phone]

## 2017-08-14 ENCOUNTER — Other Ambulatory Visit: Payer: Self-pay | Admitting: *Deleted

## 2017-08-14 DIAGNOSIS — Z170 Estrogen receptor positive status [ER+]: Principal | ICD-10-CM

## 2017-08-14 DIAGNOSIS — C50412 Malignant neoplasm of upper-outer quadrant of left female breast: Secondary | ICD-10-CM

## 2017-08-14 MED ORDER — TAMOXIFEN CITRATE 20 MG PO TABS: 20.0000 mg | ORAL_TABLET | Freq: Every day | ORAL | 0 refills | Status: DC

## 2017-10-29 NOTE — Progress Notes (Signed)
[place]  Telephone:(336) [phone] Fax:(336) [phone]    ID: Jessica [name] Height DOB: 1965-10-05  MR#: [id number]  BSJ#:[id number]  Patient Care Team: Deborah Chalk, FNP as PCP - General (Nurse Practitioner) [name], MD as Consulting Physician (General Surgery) Larya Charpentier, Virgie Dad, MD as Consulting Physician (Oncology) Kyung Rudd, MD as Consulting Physician (Radiation Oncology) Melina Schools, MD as Consulting Physician (Orthopedic Surgery) Suella Broad, MD as Consulting Physician (Physical Medicine and Rehabilitation) Lavonia Drafts, MD as Consulting Physician (Obstetrics and Gynecology)  OTHER: Melina Schools MD, Philis Fendt MD  CHIEF COMPLAINT: triple positive breast cancer  CURRENT TREATMENT: Tamoxifen  BREAST CANCER HISTORY: From the original intake note:  Jessica [name] herself palpated a mass in her left breast. She brought it to the attention of [name] at the health department and she was set up for unilateral left mammography and ultrasound 11/27/2013 at the [facility]. This showed a possible distortion in the left breast upper outer quadrant. However the patient's breasts are density category D. On exam there was a firm nodule or mass in the left breast 2:00 location which by ultrasound was irregular and hypoechoic and measured 1.5 cm. There was no left axillary lymphadenopathy noted.  Biopsy of the mass in question 11/29/2013 showed ([id number]) an invasive ductal carcinoma, grade 1, estrogen receptor 94% positive, progesterone receptor 94% positive, both with strong staining intensity, with an MIB-1 of 46%, and HER-2 amplified by CISH, the signals ratio of 4.85 and the number per cell 6.55.  On 12/08/2013 the patient underwent bilateral breast MRI. This showed in the left breast upper outer quadrant a 2.9 cm irregular enhancing mass and extending posteriorly from this an area of clumped non-masslike enhancement extending approximately 6  cm and leading to a posterior group of nodules which were small but measured in aggregate 2.2 cm. In addition, there was a separate 0.8 cm irregular spiculated enhancing mass in the upper inner aspect of the left breast. In the lower outer left breast there was a 0.8 cm oval enhancing mass. There were no abnormal appearing lymph nodes.  [name] subsequent history is as detailed below  INTERVAL HISTORY: Sabeen returns today for follow-up and treatment of her estrogen receptor positive breast cancer. She continues on tamoxifen, with good tolerance. She gets hot occasionally, but she denies having hot flashes. She takes venlafaxine, and she has occasional nausea, but she denies vomiting. She denies having issues with increase vaginal discharge.  Since her last visit here she got married and her new last name is Zavaleta.  She will be due for mammography in December 2019.  REVIEW OF SYSTEMS: Caral reports that she is doing well. She says her husband, Jessica [name], is recovering at home from rotator cuff surgery.  She takes oxycodone and receives injections every 3 months for her back pain under Dr. Nelva Bush.  She denies unusual headaches, visual changes, nausea, vomiting, or dizziness. There has been no unusual cough, phlegm production, or pleurisy. This been no change in bowel or bladder habits. She denies unexplained fatigue or unexplained weight loss, bleeding, rash, or fever. A detailed review of systems was otherwise stable.    PAST MEDICAL HISTORY: Past Medical History:  Diagnosis Date  . Anxiety   . Asthma    denies history of asthma  . Breast cancer ([place]) 05/29/14   left breast Lumpectomy=Invasive ductal Carcinoma  . 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 Chronic heartburn   . COPD (chronic obstructive pulmonary disease) ([place]) 2011  does not use inhaler ofter  . Depression   . Family history of anesthesia complication    Had a hard time waking father up only once after several procedures  . GERD  (gastroesophageal reflux disease)   . Heart murmur    PMH: at age 52; no longer have murmur  . Hot flashes   . Personal history of radiation therapy   . PVC (premature ventricular contraction)    PMH;at 52 y.o.  Marland Kitchen Shortness of breath    with exertion    PAST SURGICAL HISTORY: Past Surgical History:  Procedure Laterality Date  . ABDOMINAL HYSTERECTOMY    . BREAST BIOPSY    . BREAST LUMPECTOMY Left    2016  . BREAST SURGERY     left breast biopsy x 2  . CYSTOSCOPY N/A 05/24/2013   Procedure: CYSTOSCOPY;  Surgeon: Lavonia Drafts, MD;  Location: [facility];  Service: Gynecology;  Laterality: N/A;  . LAPAROSCOPIC SALPINGO OOPHERECTOMY Bilateral 12/15/2015   Procedure: LAPAROSCOPIC  BILATERAL  OOPHORECTOMY;  Surgeon: Lavonia Drafts, MD;  Location: [facility];  Service: Gynecology;  Laterality: Bilateral;  . polyp removal     . PORTACATH PLACEMENT Right 01/02/2014   Procedure: INSERTION PORT-A-CATH;  Surgeon: [name], MD;  Location: [facility];  Service: General;  Laterality: Right;  . portacath removal    . RADIOACTIVE SEED GUIDED PARTIAL MASTECTOMY WITH AXILLARY SENTINEL LYMPH NODE BIOPSY Left 05/29/2014   Procedure: RADIOACTIVE SEED GUIDED LEFT PARTIAL MASTECTOMY WITH AXILLARY SENTINEL LYMPH NODE BIOPSY;  Surgeon: [name], MD;  Location: [facility];  Service: General;  Laterality: Left;  . ROBOTIC ASSISTED TOTAL HYSTERECTOMY WITH BILATERAL SALPINGO OOPHERECTOMY Bilateral 05/24/2013   Procedure: ROBOTIC ASSISTED TOTAL HYSTERECTOMY WITH BILATERAL SALPINGECTOMY;  Surgeon: Lavonia Drafts, MD;  Location: [facility];  Service: Gynecology;  Laterality: Bilateral;  . TONSILLECTOMY    . WISDOM TOOTH EXTRACTION      FAMILY HISTORY Family History  Problem Relation Age of Onset  . Heart disease Mother   . Hypercholesterolemia Mother   . Heart disease Father   . COPD Father   . Hypercholesterolemia Father   . Cancer Neg Hx   [name] parents are  still living, her father is 22 years old and her mother 79 years old as of August 2015. The patient had no brothers, one sister. There is no history of breast or ovarian cancer in the family.  GYNECOLOGIC HISTORY:  Patient's last menstrual period was 03/23/2013. Menarche age 25. She is GX P0. She took oral contraceptives for many years as well as Depo Provera shots. She is status post hysterectomy with bilateral salpingectomy. 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 She scans at work, and she also does all the shelving and stocking. After 11 years livingwith her significant other Jessica [name] Day they got married in 2019. He is disabled secondary to multiple orthopedic problems including bilateral avascular necrosis of the hips. He also had a R-body CVA January 2016. Both of them do smoke.     ADVANCED DIRECTIVES: Her husband is her healthcare power of attorney   HEALTH MAINTENANCE: Social History   Tobacco Use  . Smoking status: Current Every Day Smoker    Packs/day: 1.00    Years: 16.00    Pack years: 16.00    Types: Cigarettes  . Smokeless tobacco: Never Used  . Tobacco comment: 1/2ppd decreased  Substance Use Topics  . Alcohol use:  Yes    Alcohol/week: 0.0 oz    Comment: occasional   . Drug use: No     Colonoscopy:  PAP: January 2015   Bone density:  Lipid panel:  No Known Allergies  Current Outpatient Medications  Medication Sig Dispense Refill  . budesonide-formoterol (SYMBICORT) 160-4.5 MCG/ACT inhaler Inhale 1 puff into the lungs 2 (two) times daily as needed (for shortness of breath).     . calcium carbonate (TUMS - DOSED IN MG ELEMENTAL CALCIUM) 500 MG chewable tablet Chew 1 tablet by mouth daily as needed for heartburn.     . gabapentin (NEURONTIN) 300 MG capsule Take 2 capsules (600 mg total) by mouth 2 (two) times daily. 120 capsule 1  . oxyCODONE-acetaminophen (PERCOCET/ROXICET) 5-325 MG tablet Take 1 tablet by mouth  every 6 (six) hours as needed. 10 tablet 0  . tamoxifen (NOLVADEX) 20 MG tablet Take 1 tablet (20 mg total) by mouth daily. 90 tablet 0  . venlafaxine XR (EFFEXOR-XR) 75 MG 24 hr capsule Take 2 capsules (150 mg total) by mouth daily with breakfast. 60 capsule 3  . zolpidem (AMBIEN) 5 MG tablet Take 1 tablet (5 mg total) by mouth at bedtime as needed for sleep. 90 tablet 3   No current facility-administered medications for this visit.     OBJECTIVE: middle-aged white woman in no acute distress Vitals:   10/30/17 1136  BP: 123/82  Pulse: 75  Resp: 14  Temp: 98.7 F (37.1 C)  SpO2: 99%     Body mass index is 32.55 kg/m.    ECOG FS:1 - Symptomatic but completely ambulatory  Sclerae unicteric, EOMs intact Oropharynx clear and moist No cervical or supraclavicular adenopathy Lungs no rales or rhonchi Heart regular rate and rhythm Abd soft, nontender, positive bowel sounds MSK no focal spinal tenderness, no upper extremity lymphedema Neuro: nonfocal, well oriented, appropriate affect Breasts: Right breast is benign.  The left breast is status post lumpectomy followed by radiation.  There is no evidence of local recurrence.  Both axillae are benign.   LAB RESULTS:  CMP     Component Value Date/Time   NA 142 05/02/2017 1105   NA 143 11/15/2016 0933   K 4.2 05/02/2017 1105   K 3.3 (L) 11/15/2016 0933   CL 107 05/02/2017 1105   CO2 27 05/02/2017 1105   CO2 24 11/15/2016 0933   GLUCOSE 100 05/02/2017 1105   GLUCOSE 116 11/15/2016 0933   BUN 12 05/02/2017 1105   BUN 9.4 11/15/2016 0933   CREATININE 0.91 05/02/2017 1105   CREATININE 1.0 11/15/2016 0933   CALCIUM 9.4 05/02/2017 1105   CALCIUM 10.5 (H) 11/15/2016 0933   PROT 6.9 05/02/2017 1105   PROT 6.8 11/15/2016 0933   ALBUMIN 4.1 05/02/2017 1105   ALBUMIN 4.0 11/15/2016 0933   AST 13 05/02/2017 1105   AST 44 (H) 11/15/2016 0933   ALT 16 05/02/2017 1105   ALT 51 11/15/2016 0933   ALKPHOS 67 05/02/2017 1105   ALKPHOS 57  11/15/2016 0933   BILITOT 0.4 05/02/2017 1105   BILITOT 0.58 11/15/2016 0933   GFRNONAA >60 05/02/2017 1105   GFRAA >60 05/02/2017 1105    I No results found for: SPEP  Lab Results  Component Value Date   WBC 6.6 10/30/2017   NEUTROABS 4.7 10/30/2017   HGB 12.9 10/30/2017   HCT 39.4 10/30/2017   MCV 94.5 10/30/2017   PLT 153 10/30/2017      Chemistry      Component Value  Date/Time   NA 142 05/02/2017 1105   NA 143 11/15/2016 0933   K 4.2 05/02/2017 1105   K 3.3 (L) 11/15/2016 0933   CL 107 05/02/2017 1105   CO2 27 05/02/2017 1105   CO2 24 11/15/2016 0933   BUN 12 05/02/2017 1105   BUN 9.4 11/15/2016 0933   CREATININE 0.91 05/02/2017 1105   CREATININE 1.0 11/15/2016 0933      Component Value Date/Time   CALCIUM 9.4 05/02/2017 1105   CALCIUM 10.5 (H) 11/15/2016 0933   ALKPHOS 67 05/02/2017 1105   ALKPHOS 57 11/15/2016 0933   AST 13 05/02/2017 1105   AST 44 (H) 11/15/2016 0933   ALT 16 05/02/2017 1105   ALT 51 11/15/2016 0933   BILITOT 0.4 05/02/2017 1105   BILITOT 0.58 11/15/2016 0933       No results found for: LABCA2  No components found for: LABCA125  No results for input(s): INR in the last 168 hours.  Urinalysis    Component Value Date/Time   COLORURINE YELLOW 06/04/2013 1420   APPEARANCEUR HAZY (A) 06/04/2013 1420   LABSPEC 1.030 01/27/2014 1350   PHURINE 5.0 01/27/2014 1350   PHURINE 6.0 06/04/2013 1420   GLUCOSEU Negative 01/27/2014 1350   HGBUR Negative 01/27/2014 1350   HGBUR MODERATE (A) 06/04/2013 1420   HGBUR negative 10/07/2009 1044   BILIRUBINUR Color Interference 01/27/2014 1350   KETONESUR Color Interference 01/27/2014 1350   KETONESUR NEGATIVE 06/04/2013 1420   PROTEINUR Color Interference 01/27/2014 1350   PROTEINUR NEGATIVE 06/04/2013 1420   UROBILINOGEN Color Interference 01/27/2014 1350   NITRITE Color Interference 01/27/2014 1350   NITRITE NEGATIVE 06/04/2013 1420   LEUKOCYTESUR Color Interference 01/27/2014 1350     STUDIES: Diagnostic bilateral mammography with CAD and tomography on 03/29/2017 at [facility] showing: breast density category C. There was no evidence of malignancy.   ASSESSMENT: 52 y.o. BRCA negative [place] woman status post left breast upper outer quadrant biopsy 11/29/2013 for a clinical T2 N0, stage IIA invasive ductal carcinoma, grade 1, triple positive, with an MIB-1 of 46%.  (1) MRI-guided biopsy of two additional areas in the left breast showed only tubular adenomas, no malignancy.   (2) neoadjuvant chemotherapy started 01/14/2014, consisting of carboplatin, docetaxel, trastuzumab and pertuzumab given every 21 days for 6 cycles, with Neulasta on day 2. Completed 04/30/14  (3) trastuzumab continued to total one year, last dose 01/06/2015  (a) most recent echo 10/06/2014 shows a well-preserved ejection fraction  (4) status post left lumpectomy with axillary sentinel lymph node biopsy on 05/29/2014 showing a residual ypT1c ypN0 invasive ductal carcinoma, grade 2, with negative margins   (5) adjuvant radiation completed 08/27/2014  (6) started tamoxifen 09/30/2014  (a) normal bone scan 08/28/2014  (b) status post remote hysterectomy with bilateral salpingo-oophorectomy  (7) abnormal radiotracer uptake in distal left femur evaluated with CT-guided biopsy 06/20/2014, showing enchondroma, with low cellularity, no mitotic figures, and no atypia.  (8) persistent low pelvic discomfort, with unremarkable transvaginal ultrasonography 12/12/2013 and CT of the abdomen and pelvis 08/29/2014  PLAN: Jolan is now over 3 years out from definitive surgery for breast cancer with no evidence of disease recurrence.  This is very favorable.  She continues on tamoxifen, generally with good tolerance.  She is having somewhat modified hot flashes as her main concern.  The plan is to continue this for a minimum of 5 years.  We reviewed the fact that she and Jessica [name] are now each other's  healthcare power of attorney.  She is comfortable with this  She will return to see me in February, after her December mammography.  She knows to call for any other issues that may develop before the next visit.  Rachna Schonberger, Virgie Dad, MD  10/30/17 11:55 AM Medical Oncology and Hematology [facility] [address] [place], [place] [address] Tel. [phone]    Fax. [phone]  Alice Rieger, am acting as scribe for [name] MD.  I, Lurline Del MD, have reviewed the above documentation for accuracy and completeness, and I agree with the above.

## 2017-10-30 ENCOUNTER — Telehealth: Payer: Self-pay | Admitting: Oncology

## 2017-10-30 ENCOUNTER — Inpatient Hospital Stay: Payer: Medicaid Other | Attending: Oncology | Admitting: Oncology

## 2017-10-30 ENCOUNTER — Inpatient Hospital Stay: Payer: Medicaid Other

## 2017-10-30 VITALS — BP 123/82 | HR 75 | Temp 98.7°F | Resp 14 | Ht 65.0 in | Wt 195.6 lb

## 2017-10-30 DIAGNOSIS — Z90710 Acquired absence of both cervix and uterus: Secondary | ICD-10-CM | POA: Diagnosis not present

## 2017-10-30 DIAGNOSIS — C50412 Malignant neoplasm of upper-outer quadrant of left female breast: Secondary | ICD-10-CM | POA: Insufficient documentation

## 2017-10-30 DIAGNOSIS — Z170 Estrogen receptor positive status [ER+]: Secondary | ICD-10-CM | POA: Diagnosis not present

## 2017-10-30 DIAGNOSIS — Z923 Personal history of irradiation: Secondary | ICD-10-CM | POA: Diagnosis not present

## 2017-10-30 DIAGNOSIS — Z9221 Personal history of antineoplastic chemotherapy: Secondary | ICD-10-CM | POA: Insufficient documentation

## 2017-10-30 DIAGNOSIS — Z79810 Long term (current) use of selective estrogen receptor modulators (SERMs): Secondary | ICD-10-CM | POA: Diagnosis not present

## 2017-10-30 LAB — CBC WITH DIFFERENTIAL/PLATELET
BASOS PCT: 0 %
Basophils Absolute: 0 10*3/uL (ref 0.0–0.1)
Eosinophils Absolute: 0.3 10*3/uL (ref 0.0–0.5)
Eosinophils Relative: 5 %
HEMATOCRIT: 39.4 % (ref 34.8–46.6)
HEMOGLOBIN: 12.9 g/dL (ref 11.6–15.9)
Lymphocytes Relative: 20 %
Lymphs Abs: 1.3 10*3/uL (ref 0.9–3.3)
MCH: 30.9 pg (ref 25.1–34.0)
MCHC: 32.7 g/dL (ref 31.5–36.0)
MCV: 94.5 fL (ref 79.5–101.0)
MONOS PCT: 4 %
Monocytes Absolute: 0.3 10*3/uL (ref 0.1–0.9)
NEUTROS ABS: 4.7 10*3/uL (ref 1.5–6.5)
NEUTROS PCT: 71 %
Platelets: 153 10*3/uL (ref 145–400)
RBC: 4.17 MIL/uL (ref 3.70–5.45)
RDW: 13.9 % (ref 11.2–14.5)
WBC: 6.6 10*3/uL (ref 3.9–10.3)

## 2017-10-30 LAB — COMPREHENSIVE METABOLIC PANEL
ALT: 21 U/L (ref 0–44)
ANION GAP: 7 (ref 5–15)
AST: 16 U/L (ref 15–41)
Albumin: 4.2 g/dL (ref 3.5–5.0)
Alkaline Phosphatase: 73 U/L (ref 38–126)
BUN: 9 mg/dL (ref 6–20)
CHLORIDE: 107 mmol/L (ref 98–111)
CO2: 27 mmol/L (ref 22–32)
Calcium: 9.6 mg/dL (ref 8.9–10.3)
Creatinine, Ser: 0.81 mg/dL (ref 0.44–1.00)
Glucose, Bld: 77 mg/dL (ref 70–99)
Potassium: 4.1 mmol/L (ref 3.5–5.1)
Sodium: 141 mmol/L (ref 135–145)
Total Bilirubin: 0.3 mg/dL (ref 0.3–1.2)
Total Protein: 7 g/dL (ref 6.5–8.1)

## 2017-10-30 NOTE — Telephone Encounter (Signed)
Gave patient AVS and calendar.  °

## 2017-11-21 ENCOUNTER — Other Ambulatory Visit: Payer: Self-pay | Admitting: Physical Medicine and Rehabilitation

## 2017-11-21 DIAGNOSIS — M5136 Other intervertebral disc degeneration, lumbar region: Secondary | ICD-10-CM

## 2017-11-29 ENCOUNTER — Other Ambulatory Visit: Payer: Medicaid Other

## 2017-11-29 ENCOUNTER — Other Ambulatory Visit: Payer: Self-pay

## 2017-11-29 DIAGNOSIS — C50412 Malignant neoplasm of upper-outer quadrant of left female breast: Secondary | ICD-10-CM

## 2017-11-29 DIAGNOSIS — Z170 Estrogen receptor positive status [ER+]: Principal | ICD-10-CM

## 2017-11-29 MED ORDER — TAMOXIFEN CITRATE 20 MG PO TABS: 20.0000 mg | ORAL_TABLET | Freq: Every day | ORAL | 3 refills | Status: DC

## 2017-12-11 ENCOUNTER — Ambulatory Visit
Admission: RE | Admit: 2017-12-11 | Discharge: 2017-12-11 | Disposition: A | Discharge status: Home / Self Care | Payer: Medicaid Other | Source: Ambulatory Visit | Attending: Physical Medicine and Rehabilitation | Admitting: Physical Medicine and Rehabilitation

## 2017-12-11 DIAGNOSIS — M5136 Other intervertebral disc degeneration, lumbar region: Secondary | ICD-10-CM

## 2018-04-02 ENCOUNTER — Ambulatory Visit
Admission: RE | Admit: 2018-04-02 | Discharge: 2018-04-02 | Disposition: A | Discharge status: Home / Self Care | Payer: Medicaid Other | Source: Ambulatory Visit | Attending: Adult Health | Admitting: Adult Health

## 2018-04-02 DIAGNOSIS — C50412 Malignant neoplasm of upper-outer quadrant of left female breast: Secondary | ICD-10-CM

## 2018-04-02 DIAGNOSIS — Z170 Estrogen receptor positive status [ER+]: Principal | ICD-10-CM

## 2018-05-16 NOTE — Progress Notes (Signed)
No show

## 2018-05-17 ENCOUNTER — Ambulatory Visit: Payer: Medicaid Other | Admitting: Oncology

## 2018-05-17 ENCOUNTER — Other Ambulatory Visit: Payer: Medicaid Other

## 2018-05-17 ENCOUNTER — Encounter: Payer: Self-pay | Admitting: Oncology

## 2018-06-16 ENCOUNTER — Telehealth: Payer: Self-pay | Admitting: Oncology

## 2018-06-16 NOTE — Telephone Encounter (Signed)
Per sch msg, patients wants to reschedule missed appt. No answer, left message to call back.

## 2018-07-26 ENCOUNTER — Telehealth: Payer: Self-pay | Admitting: Oncology

## 2018-07-26 NOTE — Telephone Encounter (Signed)
Returned call regarding rescheduling, appointment was moved to May.

## 2018-08-21 ENCOUNTER — Telehealth: Payer: Self-pay | Admitting: Oncology

## 2018-08-21 NOTE — Progress Notes (Signed)
Jessica [name]  [phone] [phone]) [phone]    ID: Jessica [name] DOB: 08/20/1965  MR#: [id number]  YYT#:[id number]  Patient Care Team: Deborah Chalk, FNP as PCP - General (Nurse Practitioner) [name], MD as Consulting Physician (General Surgery) Magrinat, Virgie Dad, MD as Consulting Physician (Oncology) Kyung Rudd, MD as Consulting Physician (Radiation Oncology) Melina Schools, MD as Consulting Physician (Orthopedic Surgery) Suella Broad, MD as Consulting Physician (Physical Medicine and Rehabilitation) Lavonia Drafts, MD as Consulting Physician (Obstetrics and Gynecology)  OTHER: Melina Schools MD, Philis Fendt MD   I connected with Jessica [name] on 08/22/18 at  2:30 PM EDT by video enabled telemedicine visit and verified that I am speaking with the correct person using two identifiers.   I discussed the limitations, risks, security and privacy concerns of performing an evaluation and management service by telemedicine and the availability of in-person appointments. I also discussed with the patient that there may be a patient responsible charge related to this service. The patient expressed understanding and agreed to proceed.   Other persons participating in the visit and their role in the encounter: Wilburn Mylar, scribe   Patient's location: home  Provider's location: [place]: triple positive breast cancer  CURRENT TREATMENT: Tamoxifen   INTERVAL HISTORY: Jessica [name] returns today for follow-up and treatment of her estrogen receptor positive breast cancer.   She continues on tamoxifen, with good tolerance. She reports hot flashes but is able to handle them.  Since her last visit here, she underwent diagnostic bilateral breast mammogram on 04/02/2018, with results showing: breast density category D; no mammographic evidence of malignancy in either breast.   REVIEW OF SYSTEMS: Jessica [name] reports she  has been working in the yard for exercise. She takes oxycodone and receives injections every 3 months for her back pain under Dr. Nelva Bush. She wonders if the size of her breasts is causing her back pain and if she should consider reduction surgery.  The patient denies unusual headaches, visual changes, nausea, vomiting, stiff neck, dizziness, or gait imbalance. There has been no cough, phlegm production, or pleurisy, no chest pain or pressure, and no change in bowel or bladder habits. The patient denies fever, rash, bleeding, unexplained fatigue or unexplained weight loss. A detailed review of systems was otherwise entirely negative.   BREAST CANCER HISTORY: From the original intake note:  Jessica [name] herself palpated a mass in her left breast. She brought it to the attention of [name] at the health department and she was set up for unilateral left mammography and ultrasound 11/27/2013 at the [facility] Center. This showed a possible distortion in the left breast upper outer quadrant. However the patient's breasts are density category D. On exam there was a firm nodule or mass in the left breast 2:00 location which by ultrasound was irregular and hypoechoic and measured 1.5 cm. There was no left axillary lymphadenopathy noted.  Biopsy of the mass in question 11/29/2013 showed ([id number]) an invasive ductal carcinoma, grade 1, estrogen receptor 94% positive, progesterone receptor 94% positive, both with strong staining intensity, with an MIB-1 of 46%, and HER-2 amplified by CISH, the signals ratio of 4.85 and the number per cell 6.55.  On 12/08/2013 the patient underwent bilateral breast MRI. This showed in the left breast upper outer quadrant a 2.9 cm irregular enhancing mass and extending posteriorly from this an area of clumped non-masslike enhancement extending approximately 6 cm and leading to a posterior group of  nodules which were small but measured in aggregate 2.2 cm. In addition, there was a  separate 0.8 cm irregular spiculated enhancing mass in the upper inner aspect of the left breast. In the lower outer left breast there was a 0.8 cm oval enhancing mass. There were no abnormal appearing lymph nodes.  [name] subsequent history is as detailed below.   PAST MEDICAL HISTORY: Past Medical History:  Diagnosis Date  . Anxiety   . Asthma    denies history of asthma  . Breast cancer ([place]) 05/29/14   left breast Lumpectomy=Invasive ductal Carcinoma  . 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 Chronic heartburn   . COPD (chronic obstructive pulmonary disease) ([place]) 2011   does not use inhaler ofter  . Depression   . Family history of anesthesia complication    Had a hard time waking father up only once after several procedures  . GERD (gastroesophageal reflux disease)   . Heart murmur    PMH: at age 79; no longer have murmur  . Hot flashes   . Personal history of radiation therapy   . PVC (premature ventricular contraction)    PMH;at 53 y.o.  Marland Kitchen Shortness of breath    with exertion    PAST SURGICAL HISTORY: Past Surgical History:  Procedure Laterality Date  . ABDOMINAL HYSTERECTOMY    . BREAST BIOPSY    . BREAST LUMPECTOMY Left    2016  . BREAST SURGERY     left breast biopsy x 2  . CYSTOSCOPY N/A 05/24/2013   Procedure: CYSTOSCOPY;  Surgeon: Lavonia Drafts, MD;  Location: [facility];  Service: Gynecology;  Laterality: N/A;  . LAPAROSCOPIC SALPINGO OOPHERECTOMY Bilateral 12/15/2015   Procedure: LAPAROSCOPIC  BILATERAL  OOPHORECTOMY;  Surgeon: Lavonia Drafts, MD;  Location: [facility];  Service: Gynecology;  Laterality: Bilateral;  . polyp removal     . PORTACATH PLACEMENT Right 01/02/2014   Procedure: INSERTION PORT-A-CATH;  Surgeon: [name], MD;  Location: [facility];  Service: General;  Laterality: Right;  . portacath removal    . RADIOACTIVE SEED GUIDED PARTIAL MASTECTOMY WITH AXILLARY SENTINEL LYMPH NODE BIOPSY Left 05/29/2014   Procedure: RADIOACTIVE SEED GUIDED  LEFT PARTIAL MASTECTOMY WITH AXILLARY SENTINEL LYMPH NODE BIOPSY;  Surgeon: [name], MD;  Location: [facility];  Service: General;  Laterality: Left;  . ROBOTIC ASSISTED TOTAL HYSTERECTOMY WITH BILATERAL SALPINGO OOPHERECTOMY Bilateral 05/24/2013   Procedure: ROBOTIC ASSISTED TOTAL HYSTERECTOMY WITH BILATERAL SALPINGECTOMY;  Surgeon: Lavonia Drafts, MD;  Location: [facility];  Service: Gynecology;  Laterality: Bilateral;  . TONSILLECTOMY    . WISDOM TOOTH EXTRACTION      FAMILY HISTORY Family History  Problem Relation Age of Onset  . Heart disease Mother   . Hypercholesterolemia Mother   . Heart disease Father   . COPD Father   . Hypercholesterolemia Father   . Cancer Neg Hx   [name] parents are still living, her father is 57 years old and her mother 73 years old as of August 2015. The patient had no brothers, one sister. There is no history of breast or ovarian cancer in the family.  GYNECOLOGIC HISTORY:  Patient's last menstrual period was 03/23/2013. Menarche age 67. She is GX P0. She took oral contraceptives for many years as well as Depo Provera shots. She is status post hysterectomy with bilateral salpingectomy. 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 After 11 years livingwith her significant other Jessica [name] they got married in 2019. He is disabled secondary to multiple orthopedic problems including bilateral avascular necrosis of the hips. He also had a R-body CVA January 2016. Both of them do smoke.     ADVANCED DIRECTIVES: Her husband is her healthcare power of attorney   HEALTH MAINTENANCE: Social History   Tobacco Use  . Smoking status: Current Every [name] Smoker    Packs/[name]: 1.00    Years: 16.00    Pack years: 16.00    Types: Cigarettes  . Smokeless tobacco: Never Used  . Tobacco comment: 1/2ppd decreased  Substance  Use Topics  . Alcohol use: Yes    Alcohol/week: 0.0 standard drinks    Comment: occasional   . Drug use: No     Colonoscopy:  PAP: January 2015   Bone density:  Lipid panel:  No Known Allergies  Current Outpatient Medications  Medication Sig Dispense Refill  . budesonide-formoterol (SYMBICORT) 160-4.5 MCG/ACT inhaler Inhale 1 puff into the lungs 2 (two) times daily as needed (for shortness of breath).     . calcium carbonate (TUMS - DOSED IN MG ELEMENTAL CALCIUM) 500 MG chewable tablet Chew 1 tablet by mouth daily as needed for heartburn.     . gabapentin (NEURONTIN) 300 MG capsule Take 2 capsules (600 mg total) by mouth 2 (two) times daily. 120 capsule 1  . tamoxifen (NOLVADEX) 20 MG tablet Take 1 tablet (20 mg total) by mouth daily. 90 tablet 3  . venlafaxine XR (EFFEXOR-XR) 75 MG 24 hr capsule Take 2 capsules (150 mg total) by mouth daily with breakfast. 60 capsule 3  . zolpidem (AMBIEN) 5 MG tablet Take 1 tablet (5 mg total) by mouth at bedtime as needed for sleep. 90 tablet 3   No current facility-administered medications for this visit.     OBJECTIVE: middle-aged white woman in no acute distress There were no vitals filed for this visit.   There is no height or weight on file to calculate BMI.    ECOG FS:1 - Symptomatic but completely ambulatory  LAB RESULTS:  CMP     Component Value Date/Time   NA 141 10/30/2017 1124   NA 143 11/15/2016 0933   K 4.1 10/30/2017 1124   K 3.3 (L) 11/15/2016 0933   CL 107 10/30/2017 1124   CO2 27 10/30/2017 1124   CO2 24 11/15/2016 0933   GLUCOSE 77 10/30/2017 1124   GLUCOSE 116 11/15/2016 0933   BUN 9 10/30/2017 1124   BUN 9.4 11/15/2016 0933   CREATININE 0.81 10/30/2017 1124   CREATININE 1.0 11/15/2016 0933   CALCIUM 9.6 10/30/2017 1124   CALCIUM 10.5 (H) 11/15/2016 0933   PROT 7.0 10/30/2017 1124   PROT 6.8 11/15/2016 0933   ALBUMIN 4.2 10/30/2017 1124   ALBUMIN 4.0 11/15/2016 0933   AST 16 10/30/2017 1124   AST 44 (H)  11/15/2016 0933   ALT 21 10/30/2017 1124   ALT 51 11/15/2016 0933   ALKPHOS 73 10/30/2017 1124   ALKPHOS 57 11/15/2016 0933   BILITOT 0.3 10/30/2017 1124   BILITOT 0.58 11/15/2016 0933   GFRNONAA >60 10/30/2017 1124   GFRAA >60 10/30/2017 1124    I No results found for: SPEP  Lab Results  Component Value Date   WBC 6.6 10/30/2017   NEUTROABS 4.7 10/30/2017   HGB 12.9 10/30/2017   HCT 39.4 10/30/2017   MCV 94.5 10/30/2017  PLT 153 10/30/2017      Chemistry      Component Value Date/Time   NA 141 10/30/2017 1124   NA 143 11/15/2016 0933   K 4.1 10/30/2017 1124   K 3.3 (L) 11/15/2016 0933   CL 107 10/30/2017 1124   CO2 27 10/30/2017 1124   CO2 24 11/15/2016 0933   BUN 9 10/30/2017 1124   BUN 9.4 11/15/2016 0933   CREATININE 0.81 10/30/2017 1124   CREATININE 1.0 11/15/2016 0933      Component Value Date/Time   CALCIUM 9.6 10/30/2017 1124   CALCIUM 10.5 (H) 11/15/2016 0933   ALKPHOS 73 10/30/2017 1124   ALKPHOS 57 11/15/2016 0933   AST 16 10/30/2017 1124   AST 44 (H) 11/15/2016 0933   ALT 21 10/30/2017 1124   ALT 51 11/15/2016 0933   BILITOT 0.3 10/30/2017 1124   BILITOT 0.58 11/15/2016 0933       No results found for: LABCA2  No components found for: LABCA125  No results for input(s): INR in the last 168 hours.  Urinalysis    Component Value Date/Time   COLORURINE YELLOW 06/04/2013 1420   APPEARANCEUR HAZY (A) 06/04/2013 1420   LABSPEC 1.030 01/27/2014 1350   PHURINE 5.0 01/27/2014 1350   PHURINE 6.0 06/04/2013 1420   GLUCOSEU Negative 01/27/2014 1350   HGBUR Negative 01/27/2014 1350   HGBUR MODERATE (A) 06/04/2013 1420   HGBUR negative 10/07/2009 1044   BILIRUBINUR Color Interference 01/27/2014 1350   KETONESUR Color Interference 01/27/2014 1350   KETONESUR NEGATIVE 06/04/2013 1420   PROTEINUR Color Interference 01/27/2014 1350   PROTEINUR NEGATIVE 06/04/2013 1420   UROBILINOGEN Color Interference 01/27/2014 1350   NITRITE Color Interference  01/27/2014 1350   NITRITE NEGATIVE 06/04/2013 1420   LEUKOCYTESUR Color Interference 01/27/2014 1350    STUDIES: MRI 12/11/2017 of the lumbar spine showed significant degenerative disease, no evidence of cancer  ASSESSMENT: 53 y.o. BRCA negative [place] woman status post left breast upper outer quadrant biopsy 11/29/2013 for a clinical T2 N0, stage IIA invasive ductal carcinoma, grade 1, triple positive, with an MIB-1 of 46%.  (1) MRI-guided biopsy of two additional areas in the left breast showed only tubular adenomas, no malignancy.   (2) neoadjuvant chemotherapy started 01/14/2014, consisting of carboplatin, docetaxel, trastuzumab and pertuzumab given every 21 days for 6 cycles, with Neulasta on [name] 2. Completed 04/30/14  (3) trastuzumab continued to total one year, last dose 01/06/2015  (a) most recent echo 10/06/2014 shows a well-preserved ejection fraction  (4) status post left lumpectomy with axillary sentinel lymph node biopsy on 05/29/2014 showing a residual ypT1c ypN0 invasive ductal carcinoma, grade 2, with negative margins   (5) adjuvant radiation completed 08/27/2014  (6) started tamoxifen 09/30/2014  (a) normal bone density scan 08/28/2014  (b) status post remote hysterectomy with bilateral salpingo-oophorectomy  (7) abnormal radiotracer uptake in distal left femur evaluated with CT-guided biopsy 06/20/2014, showing enchondroma, with low cellularity, no mitotic figures, and no atypia.  (8) persistent low pelvic discomfort, with unremarkable transvaginal ultrasonography 12/12/2013 and benign CT of the abdomen and pelvis 08/29/2014  PLAN: Tracina is now a little over 4 years out from definitive surgery for her breast cancer with no evidence of disease recurrence.  This is very favorable.  She is tolerating tamoxifen well.  The plan is to continue that through May 2021 at which time she will have the option of stopping it.  She is interested in bilateral breast reduction  because she thinks the weight of  her breast cyst continue the same to her back pain.  I am referring her to plastics regarding that  She will have repeat mammography December of this year  Otherwise she will see me again May 2021 and that will be her "graduation" visit  She knows to call for any other issue that may develop before then.  , Virgie Dad, MD  08/22/18 1:53 PM Medical Oncology and Hematology [facility] [address] [place], [place] [address] Tel. [phone]    Fax. [phone]   I, Wilburn Mylar, am acting as scribe for Dr. Virgie Dad. .  I, Lurline Del MD, have reviewed the above documentation for accuracy and completeness, and I agree with the above.

## 2018-08-21 NOTE — Telephone Encounter (Signed)
Spoke with pt re: appt on 08/22/18 and it being turned into Webex mtg.  Patient agreed and email was sent with step-by-step instructions how to download and access mtg.

## 2018-08-22 ENCOUNTER — Inpatient Hospital Stay: Payer: Medicaid Other | Attending: Oncology | Admitting: Oncology

## 2018-08-22 ENCOUNTER — Other Ambulatory Visit: Payer: Medicaid Other

## 2018-08-22 ENCOUNTER — Encounter: Payer: Self-pay | Admitting: Oncology

## 2018-08-22 DIAGNOSIS — Z170 Estrogen receptor positive status [ER+]: Secondary | ICD-10-CM | POA: Diagnosis not present

## 2018-08-22 DIAGNOSIS — C50412 Malignant neoplasm of upper-outer quadrant of left female breast: Secondary | ICD-10-CM | POA: Diagnosis not present

## 2018-08-22 MED ORDER — TAMOXIFEN CITRATE 20 MG PO TABS: 20.0000 mg | ORAL_TABLET | Freq: Every day | ORAL | 3 refills | Status: DC

## 2018-08-23 ENCOUNTER — Telehealth: Payer: Self-pay | Admitting: Oncology

## 2018-08-23 NOTE — Telephone Encounter (Signed)
Tried to reach regarding schedule °

## 2018-11-14 NOTE — H&P (Signed)
Subjective:     Patient ID: Jessica [name] is a 53 y.o. female.  HPI  Here for follow up discussion prior to planned bilateral mastopexy. Diagnosed 2016 with left breast cancer, underwent neoadjuvant chemotherapy followed by lumpectomy SLN for ypT1cypN0 left breast cancer. Completed adjuvant RT. During chemotherapy feels she developed back pain. Has been evaluated by Dr. Rolena Infante for this and discussed spine surgery. Patient declines this and was referred to Dr. Nelva Bush for injections back. Dr. Nelva Bush also manages her chronic narcotic use for back pain. Feels back pain may in part be due to breast size.  Prior to lumpectomy DD cup. Notes lost volume left breast but wears same bra currently.  MMG 03/2018 normal  Quit smoking cold [place] with husband 7.13.20.   PMH significant for COPD.   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 States does not need to clean or stock.   Review of Systems    Objective:   Physical Exam  Cardiovascular: Normal rate, regular rhythm and normal heart sounds.  Pulmonary/Chest: Effort normal and breath sounds normal.  Lymphadenopathy:    She has no axillary adenopathy.   Chest: right upper chest port scar, left lateral breast radial scar Right>Left volume Right grade 3 ptosis left grade 2 ptosis SN to nipple R 33 L 31 cm BW R 25 L 23 cm Nipple to IMF R 9.5 L 9 cm    Assessment:     Hx left breast cancer s/p lumpetcomy, SLIN History therapeutic radiation Post operative asymmetry breasts Chronic back pain    Plan:      Reviewed importance of staying nicotine free. Congratulated her on this.  Patient with history breast cancer now with asymmetry following lumpectomy and radiation. Reviewed bilateral mastopexy/reduction with anchor type scars, drains, post operative visits and limitations, recovery. Diminished sensation nipple and breast skin, risk of nipple loss, wound healing problems, asymmetry, incidental  carcinoma, changes with wt gain/loss, aging, unacceptable cosmetic appearance reviewed. Reviewed increased risks in setting of radiation, including wound healing problems, fat necrosis.Reviewed increased risks NAC necrosis in setting of active smoking. Reviewed with aging left breast will not develop as much ptosis, right will develop recurrent ptosis sooner than right.  Additional risks including but not limited to bleeding, infection, seroma, hematoma, damage to deeper structures, need for additional surgeries, damage to adjacent structures, DVT/PE, cardiopulmonary complications reviewed.   Discussed risk COVID infectionthrough this elective surgery. Patient will receive COVID testing prior to surgery. Discussed even if patient receivesa negative test result, the tests in some cases may fail to detect the virus or patient maycontract COVID after the test.COVID 19 infectionbefore/during/aftersurgery may result in lead to a higher chance of complication and death.  Plan OP surgery. Rx for Norco given.   Irene Limbo, MD [facility] Plastic & Reconstructive Surgery [phone], pin [phone]

## 2018-11-17 HISTORY — PX: REDUCTION MAMMAPLASTY: SUR839

## 2018-11-19 ENCOUNTER — Encounter (HOSPITAL_BASED_OUTPATIENT_CLINIC_OR_DEPARTMENT_OTHER): Payer: Self-pay

## 2018-11-19 ENCOUNTER — Other Ambulatory Visit: Payer: Self-pay

## 2018-11-23 ENCOUNTER — Other Ambulatory Visit (HOSPITAL_COMMUNITY)
Admission: RE | Admit: 2018-11-23 | Discharge: 2018-11-23 | Disposition: A | Discharge status: Home / Self Care | Payer: Medicaid Other | Source: Ambulatory Visit | Attending: Plastic Surgery | Admitting: Plastic Surgery

## 2018-11-23 DIAGNOSIS — Z01812 Encounter for preprocedural laboratory examination: Secondary | ICD-10-CM | POA: Diagnosis not present

## 2018-11-23 DIAGNOSIS — Z20828 Contact with and (suspected) exposure to other viral communicable diseases: Secondary | ICD-10-CM | POA: Diagnosis not present

## 2018-11-23 NOTE — Progress Notes (Signed)

## 2018-11-24 LAB — SARS CORONAVIRUS 2 (TAT 6-24 HRS): SARS Coronavirus 2: NEGATIVE

## 2018-11-27 ENCOUNTER — Ambulatory Visit (HOSPITAL_BASED_OUTPATIENT_CLINIC_OR_DEPARTMENT_OTHER): Payer: Medicaid Other | Admitting: Anesthesiology

## 2018-11-27 ENCOUNTER — Encounter (HOSPITAL_BASED_OUTPATIENT_CLINIC_OR_DEPARTMENT_OTHER)
Admission: RE | Disposition: A | Discharge status: Home / Self Care | Payer: Self-pay | Source: Home / Self Care | Attending: Plastic Surgery

## 2018-11-27 ENCOUNTER — Other Ambulatory Visit: Payer: Self-pay

## 2018-11-27 ENCOUNTER — Ambulatory Visit (HOSPITAL_BASED_OUTPATIENT_CLINIC_OR_DEPARTMENT_OTHER)
Admission: RE | Admit: 2018-11-27 | Discharge: 2018-11-27 | Disposition: A | Discharge status: Home / Self Care | Payer: Medicaid Other | Attending: Plastic Surgery | Admitting: Plastic Surgery

## 2018-11-27 ENCOUNTER — Encounter (HOSPITAL_BASED_OUTPATIENT_CLINIC_OR_DEPARTMENT_OTHER): Payer: Self-pay

## 2018-11-27 DIAGNOSIS — Z9221 Personal history of antineoplastic chemotherapy: Secondary | ICD-10-CM | POA: Insufficient documentation

## 2018-11-27 DIAGNOSIS — G8929 Other chronic pain: Secondary | ICD-10-CM | POA: Insufficient documentation

## 2018-11-27 DIAGNOSIS — Z87891 Personal history of nicotine dependence: Secondary | ICD-10-CM | POA: Diagnosis not present

## 2018-11-27 DIAGNOSIS — Z923 Personal history of irradiation: Secondary | ICD-10-CM | POA: Insufficient documentation

## 2018-11-27 DIAGNOSIS — N6489 Other specified disorders of breast: Secondary | ICD-10-CM | POA: Diagnosis present

## 2018-11-27 DIAGNOSIS — Z853 Personal history of malignant neoplasm of breast: Secondary | ICD-10-CM | POA: Insufficient documentation

## 2018-11-27 DIAGNOSIS — M549 Dorsalgia, unspecified: Secondary | ICD-10-CM | POA: Insufficient documentation

## 2018-11-27 DIAGNOSIS — J449 Chronic obstructive pulmonary disease, unspecified: Secondary | ICD-10-CM | POA: Insufficient documentation

## 2018-11-27 HISTORY — PX: MASTOPEXY: SHX5358

## 2018-11-27 SURGERY — MASTOPEXY
Anesthesia: General | Site: Breast | Laterality: Bilateral

## 2018-11-27 MED ORDER — OXYCODONE HCL 5 MG PO TABS
5.0000 mg | ORAL_TABLET | Freq: Once | ORAL | Status: AC | PRN
  Administered 2018-11-27: 5 mg via ORAL

## 2018-11-27 MED ORDER — MIDAZOLAM HCL 5 MG/5ML IJ SOLN
INTRAMUSCULAR | Status: DC | PRN
  Administered 2018-11-27: 2 mg via INTRAVENOUS

## 2018-11-27 MED ORDER — FENTANYL CITRATE (PF) 100 MCG/2ML IJ SOLN
INTRAMUSCULAR | Status: AC
  Filled 2018-11-27: qty 2

## 2018-11-27 MED ORDER — DEXAMETHASONE SODIUM PHOSPHATE 4 MG/ML IJ SOLN
INTRAMUSCULAR | Status: DC | PRN
  Administered 2018-11-27: 10 mg via INTRAVENOUS

## 2018-11-27 MED ORDER — CHLORHEXIDINE GLUCONATE CLOTH 2 % EX PADS: 6.0000 | MEDICATED_PAD | Freq: Once | CUTANEOUS | Status: DC

## 2018-11-27 MED ORDER — EPHEDRINE SULFATE 50 MG/ML IJ SOLN
INTRAMUSCULAR | Status: DC | PRN
  Administered 2018-11-27: 10 mg via INTRAVENOUS

## 2018-11-27 MED ORDER — ACETAMINOPHEN 500 MG PO TABS
ORAL_TABLET | ORAL | Status: AC
  Filled 2018-11-27: qty 2

## 2018-11-27 MED ORDER — PROPOFOL 10 MG/ML IV BOLUS
INTRAVENOUS | Status: DC | PRN
  Administered 2018-11-27: 150 mg via INTRAVENOUS
  Administered 2018-11-27: 30 mg via INTRAVENOUS

## 2018-11-27 MED ORDER — SODIUM CHLORIDE (PF) 0.9 % IJ SOLN
INTRAMUSCULAR | Status: DC | PRN
  Administered 2018-11-27: 1000 mL

## 2018-11-27 MED ORDER — MIDAZOLAM HCL 2 MG/2ML IJ SOLN
INTRAMUSCULAR | Status: AC
  Filled 2018-11-27: qty 2

## 2018-11-27 MED ORDER — ONDANSETRON HCL 4 MG/2ML IJ SOLN: 4.0000 mg | Freq: Once | INTRAMUSCULAR | Status: DC | PRN

## 2018-11-27 MED ORDER — KETOROLAC TROMETHAMINE 30 MG/ML IJ SOLN
INTRAMUSCULAR | Status: DC | PRN
  Administered 2018-11-27: 30 mg via INTRAVENOUS

## 2018-11-27 MED ORDER — FENTANYL CITRATE (PF) 100 MCG/2ML IJ SOLN
25.0000 ug | INTRAMUSCULAR | Status: DC | PRN
  Administered 2018-11-27 (×3): 50 ug via INTRAVENOUS

## 2018-11-27 MED ORDER — GABAPENTIN 300 MG PO CAPS
ORAL_CAPSULE | ORAL | Status: AC
  Filled 2018-11-27: qty 1

## 2018-11-27 MED ORDER — ONDANSETRON HCL 4 MG/2ML IJ SOLN
INTRAMUSCULAR | Status: DC | PRN
  Administered 2018-11-27: 4 mg via INTRAVENOUS

## 2018-11-27 MED ORDER — ROCURONIUM BROMIDE 100 MG/10ML IV SOLN
INTRAVENOUS | Status: DC | PRN
  Administered 2018-11-27: 10 mg via INTRAVENOUS
  Administered 2018-11-27: 50 mg via INTRAVENOUS
  Administered 2018-11-27: 10 mg via INTRAVENOUS

## 2018-11-27 MED ORDER — CEFAZOLIN SODIUM-DEXTROSE 2-4 GM/100ML-% IV SOLN
2.0000 g | INTRAVENOUS | Status: AC
  Administered 2018-11-27: 2 g via INTRAVENOUS

## 2018-11-27 MED ORDER — SCOPOLAMINE 1 MG/3DAYS TD PT72
MEDICATED_PATCH | TRANSDERMAL | Status: AC
  Filled 2018-11-27: qty 1

## 2018-11-27 MED ORDER — CEFAZOLIN SODIUM-DEXTROSE 2-4 GM/100ML-% IV SOLN
INTRAVENOUS | Status: AC
  Filled 2018-11-27: qty 100

## 2018-11-27 MED ORDER — SUGAMMADEX SODIUM 200 MG/2ML IV SOLN
INTRAVENOUS | Status: DC | PRN
  Administered 2018-11-27: 200 mg via INTRAVENOUS

## 2018-11-27 MED ORDER — GABAPENTIN 300 MG PO CAPS
300.0000 mg | ORAL_CAPSULE | ORAL | Status: AC
  Administered 2018-11-27: 300 mg via ORAL

## 2018-11-27 MED ORDER — SCOPOLAMINE 1 MG/3DAYS TD PT72
1.0000 | MEDICATED_PATCH | Freq: Once | TRANSDERMAL | Status: AC
  Administered 2018-11-27: 1 via TRANSDERMAL

## 2018-11-27 MED ORDER — LACTATED RINGERS IV SOLN
INTRAVENOUS | Status: DC
  Administered 2018-11-27 (×2): via INTRAVENOUS

## 2018-11-27 MED ORDER — BUPIVACAINE-EPINEPHRINE 0.25% -1:200000 IJ SOLN
INTRAMUSCULAR | Status: DC | PRN
  Administered 2018-11-27: 30 mL

## 2018-11-27 MED ORDER — LIDOCAINE HCL (CARDIAC) PF 100 MG/5ML IV SOSY
PREFILLED_SYRINGE | INTRAVENOUS | Status: DC | PRN
  Administered 2018-11-27: 100 mg via INTRAVENOUS

## 2018-11-27 MED ORDER — OXYCODONE HCL 5 MG PO TABS
ORAL_TABLET | ORAL | Status: AC
  Filled 2018-11-27: qty 1

## 2018-11-27 MED ORDER — OXYCODONE HCL 5 MG/5ML PO SOLN: 5.0000 mg | Freq: Once | ORAL | Status: AC | PRN

## 2018-11-27 MED ORDER — FENTANYL CITRATE (PF) 100 MCG/2ML IJ SOLN
INTRAMUSCULAR | Status: DC | PRN
  Administered 2018-11-27: 50 ug via INTRAVENOUS
  Administered 2018-11-27: 25 ug via INTRAVENOUS
  Administered 2018-11-27 (×3): 50 ug via INTRAVENOUS
  Administered 2018-11-27: 100 ug via INTRAVENOUS
  Administered 2018-11-27: 25 ug via INTRAVENOUS

## 2018-11-27 MED ORDER — ACETAMINOPHEN 500 MG PO TABS
1000.0000 mg | ORAL_TABLET | ORAL | Status: AC
  Administered 2018-11-27: 1000 mg via ORAL

## 2018-11-27 SURGICAL SUPPLY — 70 items
BAG DECANTER FOR FLEXI CONT (MISCELLANEOUS) ×3 IMPLANT
BINDER BREAST LRG (GAUZE/BANDAGES/DRESSINGS) IMPLANT
BINDER BREAST MEDIUM (GAUZE/BANDAGES/DRESSINGS) IMPLANT
BINDER BREAST XLRG (GAUZE/BANDAGES/DRESSINGS) IMPLANT
BINDER BREAST XXLRG (GAUZE/BANDAGES/DRESSINGS) ×3 IMPLANT
BLADE SURG 10 STRL SS (BLADE) ×12 IMPLANT
BLADE SURG 15 STRL LF DISP TIS (BLADE) IMPLANT
BLADE SURG 15 STRL SS (BLADE)
BNDG GAUZE ELAST 4 BULKY (GAUZE/BANDAGES/DRESSINGS) ×6 IMPLANT
CANISTER SUCT 1200ML W/VALVE (MISCELLANEOUS) ×3 IMPLANT
CHLORAPREP W/TINT 26 (MISCELLANEOUS) ×6 IMPLANT
CLIP VESOCCLUDE MED 6/CT (CLIP) ×3 IMPLANT
CLOSURE WOUND 1/2 X4 (GAUZE/BANDAGES/DRESSINGS)
COVER MAYO STAND REUSABLE (DRAPES) ×3 IMPLANT
COVER WAND RF STERILE (DRAPES) IMPLANT
DECANTER SPIKE VIAL GLASS SM (MISCELLANEOUS) IMPLANT
DERMABOND ADVANCED (GAUZE/BANDAGES/DRESSINGS) ×4
DERMABOND ADVANCED .7 DNX12 (GAUZE/BANDAGES/DRESSINGS) ×2 IMPLANT
DRAIN CHANNEL 15F RND FF W/TCR (WOUND CARE) ×6 IMPLANT
DRAIN CHANNEL 19F RND (DRAIN) IMPLANT
DRAPE HALF SHEET 70X43 (DRAPES) ×6 IMPLANT
DRAPE SPLIT 6X30 W/TAPE (DRAPES) ×3 IMPLANT
DRAPE TOP ARMCOVERS (MISCELLANEOUS) ×3 IMPLANT
DRAPE UTILITY XL STRL (DRAPES) ×3 IMPLANT
DRSG PAD ABDOMINAL 8X10 ST (GAUZE/BANDAGES/DRESSINGS) ×6 IMPLANT
DRSG TEGADERM 2-3/8X2-3/4 SM (GAUZE/BANDAGES/DRESSINGS) IMPLANT
ELECT BLADE 4.0 EZ CLEAN MEGAD (MISCELLANEOUS)
ELECT COATED BLADE 2.86 ST (ELECTRODE) ×3 IMPLANT
ELECT REM PT RETURN 9FT ADLT (ELECTROSURGICAL) ×3
ELECTRODE BLDE 4.0 EZ CLN MEGD (MISCELLANEOUS) IMPLANT
ELECTRODE REM PT RTRN 9FT ADLT (ELECTROSURGICAL) ×1 IMPLANT
EVACUATOR SILICONE 100CC (DRAIN) ×6 IMPLANT
GAUZE SPONGE 4X4 12PLY STRL (GAUZE/BANDAGES/DRESSINGS) IMPLANT
GAUZE SPONGE 4X4 12PLY STRL LF (GAUZE/BANDAGES/DRESSINGS) IMPLANT
GLOVE BIO SURGEON STRL SZ 6 (GLOVE) ×9 IMPLANT
GLOVE BIO SURGEON STRL SZ7 (GLOVE) ×3 IMPLANT
GLOVE BIOGEL PI IND STRL 7.0 (GLOVE) ×2 IMPLANT
GLOVE BIOGEL PI INDICATOR 7.0 (GLOVE) ×4
GLOVE ECLIPSE 6.5 STRL STRAW (GLOVE) ×6 IMPLANT
GLOVE EXAM NITRILE MD LF STRL (GLOVE) ×3 IMPLANT
GOWN STRL REUS W/ TWL LRG LVL3 (GOWN DISPOSABLE) ×3 IMPLANT
GOWN STRL REUS W/ TWL XL LVL3 (GOWN DISPOSABLE) ×1 IMPLANT
GOWN STRL REUS W/TWL LRG LVL3 (GOWN DISPOSABLE) ×6
GOWN STRL REUS W/TWL XL LVL3 (GOWN DISPOSABLE) ×2
MARKER SKIN DUAL TIP RULER LAB (MISCELLANEOUS) IMPLANT
NEEDLE HYPO 25X1 1.5 SAFETY (NEEDLE) ×3 IMPLANT
NS IRRIG 1000ML POUR BTL (IV SOLUTION) ×3 IMPLANT
PACK BASIN DAY SURGERY FS (CUSTOM PROCEDURE TRAY) ×3 IMPLANT
PENCIL BUTTON HOLSTER BLD 10FT (ELECTRODE) ×3 IMPLANT
PIN SAFETY STERILE (MISCELLANEOUS) ×3 IMPLANT
SLEEVE SCD COMPRESS KNEE MED (MISCELLANEOUS) ×3 IMPLANT
SPONGE LAP 18X18 RF (DISPOSABLE) ×6 IMPLANT
STAPLER VISISTAT 35W (STAPLE) ×6 IMPLANT
STRIP CLOSURE SKIN 1/2X4 (GAUZE/BANDAGES/DRESSINGS) IMPLANT
SUT ETHILON 2 0 FS 18 (SUTURE) ×6 IMPLANT
SUT MNCRL AB 4-0 PS2 18 (SUTURE) ×6 IMPLANT
SUT PDS AB 2-0 CT2 27 (SUTURE) ×3 IMPLANT
SUT VIC AB 3-0 PS1 18 (SUTURE) ×10
SUT VIC AB 3-0 PS1 18XBRD (SUTURE) ×5 IMPLANT
SUT VIC AB 3-0 SH 27 (SUTURE)
SUT VIC AB 3-0 SH 27X BRD (SUTURE) IMPLANT
SUT VICRYL 4-0 PS2 18IN ABS (SUTURE) ×6 IMPLANT
SYR BULB IRRIGATION 50ML (SYRINGE) ×3 IMPLANT
SYR CONTROL 10ML LL (SYRINGE) ×3 IMPLANT
TAPE MEASURE VINYL STERILE (MISCELLANEOUS) ×3 IMPLANT
TOWEL GREEN STERILE FF (TOWEL DISPOSABLE) ×6 IMPLANT
TUBE CONNECTING 20'X1/4 (TUBING) ×1
TUBE CONNECTING 20X1/4 (TUBING) ×2 IMPLANT
UNDERPAD 30X30 (UNDERPADS AND DIAPERS) ×6 IMPLANT
YANKAUER SUCT BULB TIP NO VENT (SUCTIONS) ×3 IMPLANT

## 2018-11-27 NOTE — Anesthesia Procedure Notes (Signed)
Procedure Name: Intubation Date/Time: 11/27/2018 11:25 AM Performed by: Willa Frater, CRNA Pre-anesthesia Checklist: Patient identified, Emergency Drugs available, Suction available and Patient being monitored Patient Re-evaluated:Patient Re-evaluated prior to induction Oxygen Delivery Method: Circle system utilized Preoxygenation: Pre-oxygenation with 100% oxygen Induction Type: IV induction Ventilation: Mask ventilation without difficulty Tube type: Oral Tube size: 7.0 mm Number of attempts: 1 Airway Equipment and Method: Stylet and Oral airway Placement Confirmation: ETT inserted through vocal cords under direct vision,  positive ETCO2 and breath sounds checked- equal and bilateral Secured at: 23 cm Tube secured with: Tape Dental Injury: Teeth and Oropharynx as per pre-operative assessment

## 2018-11-27 NOTE — Op Note (Signed)
Operative Note   DATE OF OPERATION: 8.11.20  LOCATION: [facility]  SURGICAL DIVISION: Plastic Surgery  PREOPERATIVE DIAGNOSES:  1. History breast cancer 2. History therapeutic radiation 3. Post operative asymmetry breasts  POSTOPERATIVE DIAGNOSES:  same  PROCEDURE:  Bilateral mastopexy  SURGEON: Irene Limbo MD MBA  ASSISTANT: none  ANESTHESIA:  General.   EBL: 75 ml  COMPLICATIONS: None immediate.   INDICATIONS FOR PROCEDURE:  The patient, Jessica [name], is a 53 y.o. female born on 04-01-1966, is here for bilateral mastopexy for treatment breast asymmetry following left lumpectomy and adjuvant radiation.   FINDINGS: Left lumpectomy cavity, as noted by clip placement, included in left breast tissue resection. This was sent as separate specimen. Total volume left mastopexy 121 g Right mastopexy 148 g  DESCRIPTION OF PROCEDURE:     The patient was marked standing in the preoperative area to mark sternal notch, chest midline, anterior axillary lines, inframammary folds. The location of new nipple areolar complex was marked at level of on inframammary fold on anterior surface breast by palpation. This was marked symmetric over bilateral breasts. With aid of Wise pattern marker, location of new nipple areolar complex and vertical limbs (7cm) were markedby displacement of breasts along meridian.The patient was taken to the operating room. SCDs were placedand IV antibiotics were given. [name] operative site was prepped and draped in a sterile fashion. A time out was performed and all information was confirmed to be correct.   Over left breast, superomedial pedicle marked and nipple areolar complex marked with35mm diameter marker. Pedicle deepithlialized and developedto chest wall. Dermal pedicle soft tissue maintained over lower pole as autoaugmentation flap. Breast tissue resected over superior and lateral breast. Over lateral breast, hemoclips noted suggestion  prior lumpectomy cavity. This area was included in resection. New clips placed at new lateral border of prior resection.. Medial and lateral flaps developed.Breast tailor tacked closed.  I then directed attention to right breast where NAC marked with 38 mm marker. Superomedial pedicle designed, and the pedicle was deepithelialized. Pedicle developed until tension free rotation was possible.Breast tissue maintained over lower pole as autoaugmentation flap. Medial and lateral flaps developed. Superior pole and lateral chest wall tissue excised. Breast tailor tacked closed, and patient brought to upright sitting position and assessed for symmetry. Patent returned to supine position and breast cavities irrigated and hemostasis obtained. Local anesthetic infiltrated throughout each breast. 15 Fr JP placed in each breast and secured with 2-0 nylon. Inferiorly based dermal flap advanced superiorly beneath pedicle with 2-0 PDS suture to chest wall on each side. Closure completed bilateralwith 3-0 vicryl to approximate dermis along inframammary fold and vertical limb. NAC inset with 4-0 vicryl in dermis. Skin closure completed with 4-0 monocryl subcuticular throughout.Tissue adhesive applied. Dry dressing and breast binder applied  The patient was allowed to wake from anesthesia, extubated and taken to the recovery room in satisfactory condition.   SPECIMENS: right mastopexy, left mastopexy, left mastopexy:prior lumpectomy site  DRAINS: 15 Fr JP in right and left breast  Irene Limbo, MD [facility] Plastic & Reconstructive Surgery [phone], pin [phone]

## 2018-11-27 NOTE — Discharge Instructions (Signed)
°Post Anesthesia Home Care Instructions ° °Activity: °Get plenty of rest for the remainder of the day. A responsible individual must stay with you for 24 hours following the procedure.  °For the next 24 hours, DO NOT: °-Drive a car °-Operate machinery °-Drink alcoholic beverages °-Take any medication unless instructed by your physician °-Make any legal decisions or sign important papers. ° °Meals: °Start with liquid foods such as gelatin or soup. Progress to regular foods as tolerated. Avoid greasy, spicy, heavy foods. If nausea and/or vomiting occur, drink only clear liquids until the nausea and/or vomiting subsides. Call your physician if vomiting continues. ° °Special Instructions/Symptoms: °Your throat may feel dry or sore from the anesthesia or the breathing tube placed in your throat during surgery. If this causes discomfort, gargle with warm salt water. The discomfort should disappear within 24 hours. ° °If you had a scopolamine patch placed behind your ear for the management of post- operative nausea and/or vomiting: ° °1. The medication in the patch is effective for 72 hours, after which it should be removed.  Wrap patch in a tissue and discard in the trash. Wash hands thoroughly with soap and water. °2. You may remove the patch earlier than 72 hours if you experience unpleasant side effects which may include dry mouth, dizziness or visual disturbances. °3. Avoid touching the patch. Wash your hands with soap and water after contact with the patch. °   °About my Jackson-Pratt Bulb Drain ° °What is a Jackson-Pratt bulb? °A Jackson-Pratt is a soft, round device used to collect drainage. It is connected to a long, thin drainage catheter, which is held in place by one or two small stiches near your surgical incision site. When the bulb is squeezed, it forms a vacuum, forcing the drainage to empty into the bulb. ° °Emptying the Jackson-Pratt bulb- °To empty the bulb: °1. Release the plug on the top of the  bulb. °2. Pour the bulb's contents into a measuring container which your nurse will provide. °3. Record the time emptied and amount of drainage. Empty the drain(s) as often as your     doctor or nurse recommends. ° °Date                  Time                    Amount (Drain 1)                 Amount (Drain 2) ° °_____________________________________________________________________ ° °_____________________________________________________________________ ° °_____________________________________________________________________ ° °_____________________________________________________________________ ° °_____________________________________________________________________ ° °_____________________________________________________________________ ° °_____________________________________________________________________ ° °_____________________________________________________________________ ° °Squeezing the Jackson-Pratt Bulb- °To squeeze the bulb: °1. Make sure the plug at the top of the bulb is open. °2. Squeeze the bulb tightly in your fist. You will hear air squeezing from the bulb. °3. Replace the plug while the bulb is squeezed. °4. Use a safety pin to attach the bulb to your clothing. This will keep the catheter from     pulling at the bulb insertion site. ° °When to call your doctor- °Call your doctor if: °· Drain site becomes red, swollen or hot. °· You have a fever greater than 101 degrees F. °· There is oozing at the drain site. °· Drain falls out (apply a guaze bandage over the drain hole and secure it with tape). °· Drainage increases daily not related to activity patterns. (You will usually have more drainage when you are active than when you are resting.) °· Drainage has a   bad odor. ° ° ° ° ° ° ° ° °JP Drain Totals °· Bring this sheet to all of your post-operative appointments while you have your drains. °· Please measure your drains by CC's or ML's. °· Make sure you drain and measure your JP Drains 2 or 3  times per day. °· At the end of each day, add up totals for the left side and add up totals for the right side. °   ( 9 am )     ( 3 pm )        ( 9 pm )                °Date L  R  L  R  L  R  Total L/R  °               °               °               °               °               °               °               °               °               °               °               °               ° ° °

## 2018-11-27 NOTE — Interval H&P Note (Signed)
History and Physical Interval Note:  11/27/2018 10:49 AM  Jessica [name]  has presented today for surgery, with the diagnosis of history breast cancer, post operative asymmetry breasts, history therapeutic radiation.  The various methods of treatment have been discussed with the patient and family. After consideration of risks, benefits and other options for treatment, the patient has consented to  Procedure(s): BILATERAL BREAST MASTOPEXY (Bilateral) as a surgical intervention.  [name] history has been reviewed, patient examined, no change in status, stable for surgery.  I have reviewed the patient's chart and labs.  Questions were answered to the patient's satisfaction.     Arnoldo Hooker Aqu[employer] [place]

## 2018-11-27 NOTE — Anesthesia Postprocedure Evaluation (Signed)
Anesthesia Post Note  Patient: Jessica [name]  Procedure(s) Performed: BILATERAL BREAST MASTOPEXY (Bilateral Breast)     Patient location during evaluation: PACU Anesthesia Type: General Level of consciousness: awake and alert Pain management: pain level controlled Vital Signs Assessment: post-procedure vital signs reviewed and stable Respiratory status: spontaneous breathing, nonlabored ventilation, respiratory function stable and patient connected to nasal cannula oxygen Cardiovascular status: blood pressure returned to baseline and stable Postop Assessment: no apparent nausea or vomiting Anesthetic complications: no    Last Vitals:  Vitals:   11/27/18 1545 11/27/18 1630  BP: 123/77 129/84  Pulse: (!) 110 (!) 111  Resp: 19 18  Temp:  37.1 C  SpO2: 90% 95%    Last Pain:  Vitals:   11/27/18 1630  TempSrc:   PainSc: 3                  Catalina Gravel

## 2018-11-27 NOTE — Anesthesia Preprocedure Evaluation (Signed)
Anesthesia Evaluation  Patient identified by MRN, date of birth, ID band Patient awake    Reviewed: Allergy & Precautions, NPO status , Patient's Chart, lab work & pertinent test results  Airway Mallampati: II  TM Distance: >3 FB Neck ROM: Full    Dental  (+) Teeth Intact, Dental Advisory Given   Pulmonary former smoker,    breath sounds clear to auscultation       Cardiovascular  Rhythm:Regular Rate:Normal     Neuro/Psych    GI/Hepatic   Endo/Other    Renal/GU      Musculoskeletal   Abdominal   Peds  Hematology   Anesthesia Other Findings   Reproductive/Obstetrics                             Anesthesia Physical Anesthesia Plan  ASA: II  Anesthesia Plan: General   Post-op Pain Management:    Induction: Intravenous  PONV Risk Score and Plan: Ondansetron and Dexamethasone  Airway Management Planned: Oral ETT  Additional Equipment:   Intra-op Plan:   Post-operative Plan:   Informed Consent: I have reviewed the patients History and Physical, chart, labs and discussed the procedure including the risks, benefits and alternatives for the proposed anesthesia with the patient or authorized representative who has indicated his/her understanding and acceptance.     Dental advisory given  Plan Discussed with: CRNA and Anesthesiologist  Anesthesia Plan Comments: (Previous smoker Plan GA with oral ETT  Roberts Gaudy, MD)        Anesthesia Quick Evaluation

## 2018-11-27 NOTE — Transfer of Care (Signed)
Immediate Anesthesia Transfer of Care Note  Patient: Jessica [name]  Procedure(s) Performed: BILATERAL BREAST MASTOPEXY (Bilateral Breast)  Patient Location: PACU  Anesthesia Type:General  Level of Consciousness: sedated and confused  Airway & Oxygen Therapy: Patient Spontanous Breathing and Patient connected to face mask oxygen  Post-op Assessment: Report given to RN and Post -op Vital signs reviewed and stable  Post vital signs: Reviewed and stable  Last Vitals:  Vitals Value Taken Time  BP 141/78 11/27/18 1502  Temp    Pulse 108 11/27/18 1504  Resp 20 11/27/18 1504  SpO2 99 % 11/27/18 1504  Vitals shown include unvalidated device data.  Last Pain:  Vitals:   11/27/18 1017  TempSrc: Oral  PainSc: 5       Patients Stated Pain Goal: 4 (47/18/55 0158)  Complications: No apparent anesthesia complications

## 2018-11-28 ENCOUNTER — Encounter (HOSPITAL_BASED_OUTPATIENT_CLINIC_OR_DEPARTMENT_OTHER): Payer: Self-pay | Admitting: Plastic Surgery

## 2018-12-04 ENCOUNTER — Other Ambulatory Visit: Payer: Self-pay | Admitting: Oncology

## 2018-12-04 DIAGNOSIS — Z170 Estrogen receptor positive status [ER+]: Secondary | ICD-10-CM

## 2018-12-04 DIAGNOSIS — R922 Inconclusive mammogram: Secondary | ICD-10-CM

## 2018-12-04 DIAGNOSIS — C50412 Malignant neoplasm of upper-outer quadrant of left female breast: Secondary | ICD-10-CM

## 2018-12-04 DIAGNOSIS — N6099 Unspecified benign mammary dysplasia of unspecified breast: Secondary | ICD-10-CM

## 2018-12-04 NOTE — Progress Notes (Signed)
Jessica [name] was found to have atypical lobular hyperplasia after her recent surgery and in addition she has category D breast density.  I think she would benefit from "fast" MRI.  I called and left her a message regarding that and asked her to call my nurse for details.  I also put in the order.

## 2018-12-31 ENCOUNTER — Encounter (HOSPITAL_COMMUNITY)
Admission: EM | Disposition: A | Discharge status: Home / Self Care | Payer: Self-pay | Source: Home / Self Care | Attending: Emergency Medicine

## 2018-12-31 ENCOUNTER — Emergency Department (HOSPITAL_COMMUNITY): Payer: Medicaid Other | Admitting: Anesthesiology

## 2018-12-31 ENCOUNTER — Encounter (HOSPITAL_COMMUNITY): Payer: Self-pay | Admitting: *Deleted

## 2018-12-31 ENCOUNTER — Other Ambulatory Visit: Payer: Self-pay

## 2018-12-31 ENCOUNTER — Emergency Department (HOSPITAL_COMMUNITY): Payer: Medicaid Other

## 2018-12-31 ENCOUNTER — Observation Stay (HOSPITAL_COMMUNITY)
Admission: EM | Admit: 2018-12-31 | Discharge: 2019-01-01 | Disposition: A | Discharge status: Home / Self Care | Payer: Medicaid Other | Attending: Plastic Surgery | Admitting: Plastic Surgery

## 2018-12-31 DIAGNOSIS — L03313 Cellulitis of chest wall: Secondary | ICD-10-CM

## 2018-12-31 DIAGNOSIS — Z79899 Other long term (current) drug therapy: Secondary | ICD-10-CM | POA: Diagnosis not present

## 2018-12-31 DIAGNOSIS — Z170 Estrogen receptor positive status [ER+]: Secondary | ICD-10-CM | POA: Diagnosis not present

## 2018-12-31 DIAGNOSIS — F419 Anxiety disorder, unspecified: Secondary | ICD-10-CM | POA: Insufficient documentation

## 2018-12-31 DIAGNOSIS — Z20828 Contact with and (suspected) exposure to other viral communicable diseases: Secondary | ICD-10-CM | POA: Diagnosis not present

## 2018-12-31 DIAGNOSIS — J449 Chronic obstructive pulmonary disease, unspecified: Secondary | ICD-10-CM | POA: Diagnosis not present

## 2018-12-31 DIAGNOSIS — N6489 Other specified disorders of breast: Secondary | ICD-10-CM | POA: Diagnosis not present

## 2018-12-31 DIAGNOSIS — Z87891 Personal history of nicotine dependence: Secondary | ICD-10-CM | POA: Diagnosis not present

## 2018-12-31 DIAGNOSIS — Z6833 Body mass index (BMI) 33.0-33.9, adult: Secondary | ICD-10-CM | POA: Diagnosis not present

## 2018-12-31 DIAGNOSIS — N61 Mastitis without abscess: Secondary | ICD-10-CM | POA: Diagnosis not present

## 2018-12-31 DIAGNOSIS — E669 Obesity, unspecified: Secondary | ICD-10-CM | POA: Diagnosis not present

## 2018-12-31 DIAGNOSIS — C50412 Malignant neoplasm of upper-outer quadrant of left female breast: Secondary | ICD-10-CM | POA: Diagnosis not present

## 2018-12-31 DIAGNOSIS — F329 Major depressive disorder, single episode, unspecified: Secondary | ICD-10-CM | POA: Diagnosis not present

## 2018-12-31 DIAGNOSIS — Z923 Personal history of irradiation: Secondary | ICD-10-CM | POA: Diagnosis not present

## 2018-12-31 HISTORY — PX: IRRIGATION AND DEBRIDEMENT ABSCESS: SHX5252

## 2018-12-31 LAB — URINALYSIS, ROUTINE W REFLEX MICROSCOPIC
Bilirubin Urine: NEGATIVE
Glucose, UA: NEGATIVE mg/dL
Hgb urine dipstick: NEGATIVE
Ketones, ur: NEGATIVE mg/dL
Nitrite: NEGATIVE
Protein, ur: 30 mg/dL — AB
Specific Gravity, Urine: 1.026 (ref 1.005–1.030)
WBC, UA: >50 WBC/hpf — ABNORMAL HIGH (ref 0–5)
pH: 6 (ref 5.0–8.0)

## 2018-12-31 LAB — COMPREHENSIVE METABOLIC PANEL
ALT: 13 U/L (ref 0–44)
AST: 14 U/L — ABNORMAL LOW (ref 15–41)
Albumin: 4.2 g/dL (ref 3.5–5.0)
Alkaline Phosphatase: 53 U/L (ref 38–126)
Anion gap: 12 (ref 5–15)
BUN: 11 mg/dL (ref 6–20)
CO2: 23 mmol/L (ref 22–32)
Calcium: 9.3 mg/dL (ref 8.9–10.3)
Chloride: 98 mmol/L (ref 98–111)
Creatinine, Ser: 1.19 mg/dL — ABNORMAL HIGH (ref 0.44–1.00)
GFR calc Af Amer: >60 mL/min (ref >60)
GFR calc non Af Amer: 53 mL/min — ABNORMAL LOW (ref >60)
Glucose, Bld: 113 mg/dL — ABNORMAL HIGH (ref 70–99)
Potassium: 3.9 mmol/L (ref 3.5–5.1)
Sodium: 133 mmol/L — ABNORMAL LOW (ref 135–145)
Total Bilirubin: 0.8 mg/dL (ref 0.3–1.2)
Total Protein: 7.2 g/dL (ref 6.5–8.1)

## 2018-12-31 LAB — CBC WITH DIFFERENTIAL/PLATELET
Abs Immature Granulocytes: 0.07 10*3/uL (ref 0.00–0.07)
Basophils Absolute: 0.1 10*3/uL (ref 0.0–0.1)
Basophils Relative: 0 %
Eosinophils Absolute: 0.2 10*3/uL (ref 0.0–0.5)
Eosinophils Relative: 1 %
HCT: 43.6 % (ref 36.0–46.0)
Hemoglobin: 14.4 g/dL (ref 12.0–15.0)
Immature Granulocytes: 0 %
Lymphocytes Relative: 11 %
Lymphs Abs: 2 10*3/uL (ref 0.7–4.0)
MCH: 32.4 pg (ref 26.0–34.0)
MCHC: 33 g/dL (ref 30.0–36.0)
MCV: 98.2 fL (ref 80.0–100.0)
Monocytes Absolute: 1.3 10*3/uL — ABNORMAL HIGH (ref 0.1–1.0)
Monocytes Relative: 7 %
Neutro Abs: 14.8 10*3/uL — ABNORMAL HIGH (ref 1.7–7.7)
Neutrophils Relative %: 81 %
Platelets: 168 10*3/uL (ref 150–400)
RBC: 4.44 MIL/uL (ref 3.87–5.11)
RDW: 13.3 % (ref 11.5–15.5)
WBC: 18.4 10*3/uL — ABNORMAL HIGH (ref 4.0–10.5)
nRBC: 0 % (ref 0.0–0.2)

## 2018-12-31 LAB — I-STAT BETA HCG BLOOD, ED (MC, WL, AP ONLY): I-stat hCG, quantitative: <5 m[IU]/mL (ref <5)

## 2018-12-31 LAB — LACTIC ACID, PLASMA
Lactic Acid, Venous: 2.1 mmol/L (ref 0.5–1.9)
Lactic Acid, Venous: 1.9 mmol/L (ref 0.5–1.9)

## 2018-12-31 LAB — SARS CORONAVIRUS 2 BY RT PCR (HOSPITAL ORDER, PERFORMED IN ~~LOC~~ HOSPITAL LAB): SARS Coronavirus 2: NEGATIVE

## 2018-12-31 LAB — PROTIME-INR
INR: 1.1 (ref 0.8–1.2)
Prothrombin Time: 14.1 seconds (ref 11.4–15.2)

## 2018-12-31 SURGERY — MINOR INCISION AND DRAINAGE OF ABSCESS
Anesthesia: General | Site: Breast | Laterality: Left

## 2018-12-31 MED ORDER — ACETAMINOPHEN 500 MG PO TABS
1000.0000 mg | ORAL_TABLET | Freq: Once | ORAL | Status: AC
  Administered 2018-12-31: 04:00:00 1000 mg via ORAL
  Filled 2018-12-31: qty 2

## 2018-12-31 MED ORDER — ONDANSETRON HCL 4 MG/2ML IJ SOLN: 4.0000 mg | Freq: Once | INTRAMUSCULAR | Status: DC | PRN

## 2018-12-31 MED ORDER — LIDOCAINE 2% (20 MG/ML) 5 ML SYRINGE
INTRAMUSCULAR | Status: DC | PRN
  Administered 2018-12-31: 40 mg via INTRAVENOUS

## 2018-12-31 MED ORDER — VANCOMYCIN HCL 10 G IV SOLR
1500.0000 mg | INTRAVENOUS | Status: DC
  Administered 2019-01-01: 10:00:00 1500 mg via INTRAVENOUS
  Filled 2018-12-31: qty 1500

## 2018-12-31 MED ORDER — OXYCODONE HCL 5 MG/5ML PO SOLN: 5.0000 mg | Freq: Once | ORAL | Status: DC | PRN

## 2018-12-31 MED ORDER — DEXAMETHASONE SODIUM PHOSPHATE 10 MG/ML IJ SOLN
INTRAMUSCULAR | Status: DC | PRN
  Administered 2018-12-31: 5 mg via INTRAVENOUS

## 2018-12-31 MED ORDER — HYDROMORPHONE HCL 1 MG/ML IJ SOLN
0.5000 mg | INTRAMUSCULAR | Status: DC | PRN
  Administered 2018-12-31: 1 mg via INTRAVENOUS
  Filled 2018-12-31: qty 1

## 2018-12-31 MED ORDER — SODIUM CHLORIDE 0.9 % IV SOLN
INTRAVENOUS | Status: DC | PRN
  Administered 2018-12-31: 07:00:00 500 mL

## 2018-12-31 MED ORDER — ONDANSETRON 4 MG PO TBDP: 4.0000 mg | ORAL_TABLET | Freq: Four times a day (QID) | ORAL | Status: DC | PRN

## 2018-12-31 MED ORDER — ONDANSETRON HCL 4 MG/2ML IJ SOLN: 4.0000 mg | Freq: Four times a day (QID) | INTRAMUSCULAR | Status: DC | PRN

## 2018-12-31 MED ORDER — ACETAMINOPHEN 650 MG RE SUPP: 650.0000 mg | Freq: Four times a day (QID) | RECTAL | Status: DC | PRN

## 2018-12-31 MED ORDER — FENTANYL CITRATE (PF) 250 MCG/5ML IJ SOLN
INTRAMUSCULAR | Status: DC | PRN
  Administered 2018-12-31: 100 ug via INTRAVENOUS
  Administered 2018-12-31: 50 ug via INTRAVENOUS

## 2018-12-31 MED ORDER — PROPOFOL 10 MG/ML IV BOLUS
INTRAVENOUS | Status: AC
  Filled 2018-12-31: qty 20

## 2018-12-31 MED ORDER — ZOLPIDEM TARTRATE 5 MG PO TABS
5.0000 mg | ORAL_TABLET | Freq: Every evening | ORAL | Status: DC | PRN
  Administered 2018-12-31: 5 mg via ORAL
  Filled 2018-12-31: qty 1

## 2018-12-31 MED ORDER — CLINDAMYCIN PHOSPHATE 600 MG/50ML IV SOLN
600.0000 mg | Freq: Once | INTRAVENOUS | Status: AC
  Administered 2018-12-31: 600 mg via INTRAVENOUS
  Filled 2018-12-31: qty 50

## 2018-12-31 MED ORDER — MIDAZOLAM HCL 2 MG/2ML IJ SOLN
INTRAMUSCULAR | Status: DC | PRN
  Administered 2018-12-31: 2 mg via INTRAVENOUS

## 2018-12-31 MED ORDER — ACETAMINOPHEN 325 MG PO TABS: 650.0000 mg | ORAL_TABLET | Freq: Four times a day (QID) | ORAL | Status: DC | PRN

## 2018-12-31 MED ORDER — KCL IN DEXTROSE-NACL 20-5-0.45 MEQ/L-%-% IV SOLN
INTRAVENOUS | Status: DC
  Administered 2018-12-31 – 2019-01-01 (×2): via INTRAVENOUS
  Filled 2018-12-31 (×2): qty 1000

## 2018-12-31 MED ORDER — OXYCODONE HCL 5 MG/5ML PO SOLN: 5.0000 mg | Freq: Once | ORAL | Status: AC | PRN

## 2018-12-31 MED ORDER — LIDOCAINE 2% (20 MG/ML) 5 ML SYRINGE
INTRAMUSCULAR | Status: AC
  Filled 2018-12-31: qty 5

## 2018-12-31 MED ORDER — FLUTICASONE FUROATE-VILANTEROL 200-25 MCG/INH IN AEPB
1.0000 | INHALATION_SPRAY | Freq: Every day | RESPIRATORY_TRACT | Status: DC
  Filled 2018-12-31: qty 28

## 2018-12-31 MED ORDER — OXYCODONE HCL 5 MG PO TABS: 5.0000 mg | ORAL_TABLET | Freq: Once | ORAL | Status: DC | PRN

## 2018-12-31 MED ORDER — SODIUM CHLORIDE 0.9 % IV SOLN
INTRAVENOUS | Status: AC
  Filled 2018-12-31: qty 500000

## 2018-12-31 MED ORDER — LACTATED RINGERS IV BOLUS
1000.0000 mL | Freq: Once | INTRAVENOUS | Status: AC
  Administered 2018-12-31: 1000 mL via INTRAVENOUS

## 2018-12-31 MED ORDER — MORPHINE SULFATE (PF) 2 MG/ML IV SOLN
1.0000 mg | INTRAVENOUS | Status: DC | PRN
  Administered 2018-12-31: 05:00:00 1 mg via INTRAVENOUS
  Filled 2018-12-31: qty 1

## 2018-12-31 MED ORDER — FENTANYL CITRATE (PF) 250 MCG/5ML IJ SOLN
INTRAMUSCULAR | Status: AC
  Filled 2018-12-31: qty 5

## 2018-12-31 MED ORDER — KETOROLAC TROMETHAMINE 30 MG/ML IJ SOLN
30.0000 mg | Freq: Three times a day (TID) | INTRAMUSCULAR | Status: AC
  Administered 2018-12-31 – 2019-01-01 (×3): 30 mg via INTRAVENOUS
  Filled 2018-12-31 (×3): qty 1

## 2018-12-31 MED ORDER — FENTANYL CITRATE (PF) 100 MCG/2ML IJ SOLN
INTRAMUSCULAR | Status: AC
  Filled 2018-12-31: qty 2

## 2018-12-31 MED ORDER — VANCOMYCIN HCL 10 G IV SOLR
1750.0000 mg | Freq: Once | INTRAVENOUS | Status: AC
  Administered 2018-12-31: 1750 mg via INTRAVENOUS
  Filled 2018-12-31: qty 1750

## 2018-12-31 MED ORDER — PROPOFOL 10 MG/ML IV BOLUS
INTRAVENOUS | Status: DC | PRN
  Administered 2018-12-31: 160 mg via INTRAVENOUS

## 2018-12-31 MED ORDER — GABAPENTIN 300 MG PO CAPS
600.0000 mg | ORAL_CAPSULE | Freq: Two times a day (BID) | ORAL | Status: DC
  Administered 2018-12-31 – 2019-01-01 (×3): 600 mg via ORAL
  Filled 2018-12-31 (×3): qty 2

## 2018-12-31 MED ORDER — 0.9 % SODIUM CHLORIDE (POUR BTL) OPTIME
TOPICAL | Status: DC | PRN
  Administered 2018-12-31: 1000 mL

## 2018-12-31 MED ORDER — VENLAFAXINE HCL ER 75 MG PO CP24
150.0000 mg | ORAL_CAPSULE | Freq: Every day | ORAL | Status: DC
  Administered 2019-01-01: 150 mg via ORAL
  Filled 2018-12-31 (×2): qty 2

## 2018-12-31 MED ORDER — OXYCODONE HCL 5 MG PO TABS
ORAL_TABLET | ORAL | Status: AC
  Filled 2018-12-31: qty 1

## 2018-12-31 MED ORDER — OXYCODONE HCL 5 MG PO TABS
5.0000 mg | ORAL_TABLET | ORAL | Status: DC | PRN
  Administered 2018-12-31 – 2019-01-01 (×3): 10 mg via ORAL
  Filled 2018-12-31 (×3): qty 2

## 2018-12-31 MED ORDER — OXYCODONE HCL 5 MG PO TABS
5.0000 mg | ORAL_TABLET | Freq: Once | ORAL | Status: AC | PRN
  Administered 2018-12-31: 5 mg via ORAL

## 2018-12-31 MED ORDER — FENTANYL CITRATE (PF) 100 MCG/2ML IJ SOLN: 25.0000 ug | INTRAMUSCULAR | Status: DC | PRN

## 2018-12-31 MED ORDER — MIDAZOLAM HCL 2 MG/2ML IJ SOLN
INTRAMUSCULAR | Status: AC
  Filled 2018-12-31: qty 2

## 2018-12-31 MED ORDER — LACTATED RINGERS IV SOLN
INTRAVENOUS | Status: DC | PRN
  Administered 2018-12-31: 07:00:00 via INTRAVENOUS

## 2018-12-31 MED ORDER — ENOXAPARIN SODIUM 40 MG/0.4ML ~~LOC~~ SOLN
40.0000 mg | SUBCUTANEOUS | Status: DC
  Administered 2019-01-01: 10:00:00 40 mg via SUBCUTANEOUS
  Filled 2018-12-31: qty 0.4

## 2018-12-31 MED ORDER — DEXAMETHASONE SODIUM PHOSPHATE 10 MG/ML IJ SOLN
INTRAMUSCULAR | Status: AC
  Filled 2018-12-31: qty 1

## 2018-12-31 MED ORDER — FENTANYL CITRATE (PF) 100 MCG/2ML IJ SOLN
25.0000 ug | INTRAMUSCULAR | Status: DC | PRN
  Administered 2018-12-31 (×2): 50 ug via INTRAVENOUS

## 2018-12-31 MED ORDER — ONDANSETRON HCL 4 MG/2ML IJ SOLN
INTRAMUSCULAR | Status: DC | PRN
  Administered 2018-12-31: 4 mg via INTRAVENOUS

## 2018-12-31 MED ORDER — ONDANSETRON HCL 4 MG/2ML IJ SOLN
INTRAMUSCULAR | Status: AC
  Filled 2018-12-31: qty 2

## 2018-12-31 SURGICAL SUPPLY — 36 items
BLADE CLIPPER SURG (BLADE) IMPLANT
BNDG ELASTIC 4X5.8 VLCR STR LF (GAUZE/BANDAGES/DRESSINGS) IMPLANT
BNDG GAUZE ELAST 4 BULKY (GAUZE/BANDAGES/DRESSINGS) IMPLANT
CANISTER SUCT 3000ML PPV (MISCELLANEOUS) ×3 IMPLANT
CHLORAPREP W/TINT 26 (MISCELLANEOUS) IMPLANT
COVER SURGICAL LIGHT HANDLE (MISCELLANEOUS) ×3 IMPLANT
COVER WAND RF STERILE (DRAPES) IMPLANT
DRAIN CHANNEL 19F RND (DRAIN) ×3 IMPLANT
DRAPE HALF SHEET 40X57 (DRAPES) IMPLANT
DRAPE ORTHO SPLIT 77X108 STRL (DRAPES)
DRAPE SURG ORHT 6 SPLT 77X108 (DRAPES) IMPLANT
DRSG PAD ABDOMINAL 8X10 ST (GAUZE/BANDAGES/DRESSINGS) ×3 IMPLANT
ELECT COATED BLADE 2.86 ST (ELECTRODE) ×3 IMPLANT
ELECT REM PT RETURN 9FT ADLT (ELECTROSURGICAL) ×3
ELECTRODE REM PT RTRN 9FT ADLT (ELECTROSURGICAL) ×1 IMPLANT
EVACUATOR SILICONE 100CC (DRAIN) ×3 IMPLANT
GAUZE SPONGE 4X4 12PLY STRL (GAUZE/BANDAGES/DRESSINGS) IMPLANT
GLOVE BIO SURGEON STRL SZ 6 (GLOVE) ×3 IMPLANT
GOWN STRL REUS W/ TWL LRG LVL3 (GOWN DISPOSABLE) ×2 IMPLANT
GOWN STRL REUS W/TWL LRG LVL3 (GOWN DISPOSABLE) ×4
HANDPIECE INTERPULSE COAX TIP (DISPOSABLE)
KIT BASIN OR (CUSTOM PROCEDURE TRAY) ×3 IMPLANT
KIT TURNOVER KIT B (KITS) ×3 IMPLANT
NS IRRIG 1000ML POUR BTL (IV SOLUTION) ×3 IMPLANT
PACK GENERAL/GYN (CUSTOM PROCEDURE TRAY) ×3 IMPLANT
PAD ABD 8X10 STRL (GAUZE/BANDAGES/DRESSINGS) ×3 IMPLANT
PAD ARMBOARD 7.5X6 YLW CONV (MISCELLANEOUS) ×6 IMPLANT
SET HNDPC FAN SPRY TIP SCT (DISPOSABLE) IMPLANT
SUT ETHILON 2 0 FS 18 (SUTURE) ×3 IMPLANT
SUT ETHILON 4 0 PS 2 18 (SUTURE) ×6 IMPLANT
SUT MON AB 3-0 SH 27 (SUTURE) ×2
SUT MON AB 3-0 SH27 (SUTURE) ×1 IMPLANT
TOWEL GREEN STERILE (TOWEL DISPOSABLE) ×3 IMPLANT
TOWEL GREEN STERILE FF (TOWEL DISPOSABLE) ×3 IMPLANT
UNDERPAD 30X30 (UNDERPADS AND DIAPERS) ×3 IMPLANT
WATER STERILE IRR 1000ML POUR (IV SOLUTION) IMPLANT

## 2018-12-31 NOTE — Transfer of Care (Signed)
Immediate Anesthesia Transfer of Care Note  Patient: Jessica [name]  Procedure(s) Performed: INCISION AND DRAINAGE BREAST (Left Breast)  Patient Location: PACU  Anesthesia Type:General  Level of Consciousness: awake  Airway & Oxygen Therapy: Patient Spontanous Breathing  Post-op Assessment: Report given to RN  Post vital signs: Reviewed and stable  Last Vitals:  Vitals Value Taken Time  BP    Temp    Pulse    Resp 22 12/31/18 0826  SpO2    Vitals shown include unvalidated device data.  Last Pain:  Vitals:   12/31/18 0358  TempSrc: Oral  PainSc:          Complications: No apparent anesthesia complications

## 2018-12-31 NOTE — ED Provider Notes (Signed)
[facility] EMERGENCY DEPARTMENT Provider Note   CSN: [id number] Arrival date & time: 12/31/18  0021     History   Chief Complaint Chief Complaint  Patient presents with   Wound Check    HPI Jessica [name] is a 53 y.o. female.     Patient referred to the emergency department by Dr. Iran Planas for likely admission.  Patient underwent breast reduction surgery 1 month ago.  She reports that she noticed a tender and red spot on the top part of her breast yesterday morning.   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   Enchondroma of bone 07/02/2014   Lytic bone lesion of left femur    Low back pain 03/25/2014   Constipation 03/25/2014   Thrush 03/04/2014   Spasm of back muscles 03/04/2014   Insomnia 02/13/2014   UTI (urinary tract infection) 01/27/2014   Cerumen impaction 01/27/2014   Diarrhea 01/27/2014   Sinusitis 01/20/2014   Acute sinusitis 01/20/2014   Rash 01/20/2014   Excessive cerumen in both ear canals 01/20/2014   Oral mucositis due to antineoplastic therapy 01/20/2014   Malignant neoplasm of upper-outer quadrant of  left breast in female, estrogen receptor positive (Jessica [name]) 12/03/2013   Postoperative infection 06/04/2013   HYPERTRIGLYCERIDEMIA 10/07/2009   MENORRHAGIA 10/07/2009   TOBACCO ABUSE 06/22/2009   OVARIAN CYST 06/22/2009   ELEVATED BLOOD PRESSURE WITHOUT DIAGNOSIS OF HYPERTENSION 06/22/2009   CHRONIC OBSTRUCTIVE PULMONARY DISEASE 06/11/2009    Past Surgical History:  Procedure Laterality Date   ABDOMINAL HYSTERECTOMY     BREAST BIOPSY     BREAST LUMPECTOMY Left    2016   BREAST SURGERY     left breast biopsy x 2   CYSTOSCOPY N/A 05/24/2013   Procedure: CYSTOSCOPY;  Surgeon: Lavonia Drafts, MD;  Location: [facility];  Service: Gynecology;  Laterality: N/A;   LAPAROSCOPIC SALPINGO OOPHERECTOMY Bilateral 12/15/2015   Procedure: LAPAROSCOPIC  BILATERAL  OOPHORECTOMY;  Surgeon: Lavonia Drafts, MD;  Location: [facility];  Service: Gynecology;  Laterality: Bilateral;   MASTOPEXY Bilateral 11/27/2018   Procedure: BILATERAL BREAST MASTOPEXY;  Surgeon: Irene Limbo, MD;  Location: [facility];  Service: Plastics;  Laterality: Bilateral;   polyp removal      PORTACATH PLACEMENT Right 01/02/2014   Procedure: INSERTION PORT-A-CATH;  Surgeon: [name], MD;  Location: [facility];  Service: General;  Laterality: Right;   portacath removal     RADIOACTIVE SEED GUIDED PARTIAL MASTECTOMY WITH AXILLARY SENTINEL LYMPH NODE BIOPSY Left 05/29/2014   Procedure: RADIOACTIVE SEED  GUIDED LEFT PARTIAL MASTECTOMY WITH AXILLARY SENTINEL LYMPH NODE BIOPSY;  Surgeon: [name], MD;  Location: [facility];  Service: General;  Laterality: Left;   ROBOTIC ASSISTED TOTAL HYSTERECTOMY WITH BILATERAL SALPINGO OOPHERECTOMY Bilateral 05/24/2013   Procedure: ROBOTIC ASSISTED TOTAL HYSTERECTOMY WITH BILATERAL SALPINGECTOMY;  Surgeon: Lavonia Drafts, MD;  Location: [facility];  Service: Gynecology;  Laterality: Bilateral;   TONSILLECTOMY     WISDOM TOOTH  EXTRACTION       OB History    Gravida  0   Para      Term      Preterm      AB      Living        SAB      TAB      Ectopic      Multiple      Live Births               Home Medications    Prior to Admission medications   Medication Sig Start Date End Date Taking? Authorizing Provider  budesonide-formoterol (SYMBICORT) 160-4.5 MCG/ACT inhaler Inhale 1 puff into the lungs 2 (two) times daily as needed (for shortness of breath).     [provider]  calcium carbonate (TUMS - DOSED IN MG ELEMENTAL CALCIUM) 500 MG chewable tablet Chew 1 tablet by mouth daily as needed for heartburn.     [provider]  gabapentin (NEURONTIN) 300 MG capsule Take 2 capsules (600 mg total) by mouth 2 (two) times daily. 01/24/17   Lavonia Drafts, MD  HYDROcodone-acetaminophen (NORCO/VICODIN) 5-325 MG tablet Take 1 tablet by mouth 2 (two) times daily.    [provider]  tamoxifen (NOLVADEX) 20 MG tablet Take 1 tablet (20 mg total) by mouth daily. 08/22/18   Magrinat, Virgie Dad, MD  venlafaxine XR (EFFEXOR-XR) 75 MG 24 hr capsule Take 2 capsules (150 mg total) by mouth daily with breakfast. 12/24/15   Magrinat, Virgie Dad, MD  zolpidem (AMBIEN) 5 MG tablet Take 1 tablet (5 mg total) by mouth at bedtime as needed for sleep. 06/10/14   Boelter, Genelle Gather, NP    Family History Family History  Problem Relation Age of Onset   Heart disease Mother    Hypercholesterolemia Mother    Heart disease Father    COPD Father    Hypercholesterolemia Father    Cancer Neg Hx     Social History Social History   Tobacco Use   Smoking status: Former Smoker    Packs/day: 1.00    Years: 16.00    Pack years: 16.00    Types: Cigarettes    Quit date: 10/28/2018    Years since quitting: 0.1   Smokeless tobacco: Never Used  Substance Use Topics   Alcohol use: Yes    Alcohol/week: 0.0 standard drinks    Comment: occasional    Drug use: No     Allergies     Patient has no known allergies.   Review of Systems Review of Systems  Constitutional: Positive for chills and fever.  Skin: Positive for color change.  All other systems reviewed and are negative.    Physical Exam Updated Vital Signs BP (!) 142/86 (BP Location: Right Arm)    Pulse (!) 107    Temp 100.3 F (37.9 C) (Oral)    Resp 18    Ht 5\' 5"  (1.651 m)    Wt 90.7 kg    LMP 03/23/2013 Comment: irregular   SpO2 98%  BMI 33.28 kg/m   Physical Exam Vitals signs and nursing note reviewed. Exam conducted with a chaperone present.  Constitutional:      General: She is not in acute distress.    Appearance: Normal appearance. She is well-developed.  HENT:     Head: Normocephalic and atraumatic.     Right Ear: Hearing normal.     Left Ear: Hearing normal.     Nose: Nose normal.  Eyes:     Conjunctiva/sclera: Conjunctivae normal.     Pupils: Pupils are equal, round, and reactive to light.  Neck:     Musculoskeletal: Normal range of motion and neck supple.  Cardiovascular:     Rate and Rhythm: Regular rhythm.     Heart sounds: S1 normal and S2 normal. No murmur. No friction rub. No gallop.   Pulmonary:     Effort: Pulmonary effort is normal. No respiratory distress.     Breath sounds: Normal breath sounds.  Chest:     Chest wall: Tenderness present.    Abdominal:     General: Bowel sounds are normal.     Palpations: Abdomen is soft.     Tenderness: There is no abdominal tenderness. There is no guarding or rebound. Negative signs include Murphy's sign and McBurney's sign.     Hernia: No hernia is present.  Musculoskeletal: Normal range of motion.  Skin:    General: Skin is warm and dry.     Findings: No rash.  Neurological:     Mental Status: She is alert and oriented to person, place, and time.     GCS: GCS eye subscore is 4. GCS verbal subscore is 5. GCS motor subscore is 6.     Cranial Nerves: No cranial nerve deficit.     Sensory: No sensory deficit.      Coordination: Coordination normal.  Psychiatric:        Speech: Speech normal.        Behavior: Behavior normal.        Thought Content: Thought content normal.      ED Treatments / Results  Labs (all labs ordered are listed, but only abnormal results are displayed) Labs Reviewed  COMPREHENSIVE METABOLIC PANEL - Abnormal; Notable for the following components:      Result Value   Sodium 133 (*)    Glucose, Bld 113 (*)    Creatinine, Ser 1.19 (*)    AST 14 (*)    GFR calc non Af Amer 53 (*)    All other components within normal limits  LACTIC ACID, PLASMA - Abnormal; Notable for the following components:   Lactic Acid, Venous 2.1 (*)    All other components within normal limits  CBC WITH DIFFERENTIAL/PLATELET - Abnormal; Notable for the following components:   WBC 18.4 (*)    Neutro Abs 14.8 (*)    Monocytes Absolute 1.3 (*)    All other components within normal limits  URINALYSIS, ROUTINE W REFLEX MICROSCOPIC - Abnormal; Notable for the following components:   Color, Urine AMBER (*)    APPearance CLOUDY (*)    Protein, ur 30 (*)    Leukocytes,Ua LARGE (*)    WBC, UA >50 (*)    Bacteria, UA FEW (*)    All other components within normal limits  CULTURE, BLOOD (ROUTINE X 2)  CULTURE, BLOOD (ROUTINE X 2)  SARS CORONAVIRUS 2 (HOSPITAL ORDER, [facility])  PROTIME-INR  LACTIC ACID, PLASMA  I-STAT BETA HCG BLOOD, ED ([facility],  [facility], [facility] ONLY)    EKG None  Radiology Dg Chest 2 View  Result Date: 12/31/2018 CLINICAL DATA:  Suspected sepsis, breast reduction surgery 1 month prior. Left breast is red and swollen, no drainage EXAM: CHEST - 2 VIEW COMPARISON:  Radiograph 01/02/2014, CT 06/11/2009 FINDINGS: Accounting for body habitus, the lungs are clear. No consolidation, features of edema, pneumothorax, or effusion. Pulmonary vascularity is normally distributed. The cardiomediastinal contours are unremarkable. No acute osseous or soft tissue abnormality.  IMPRESSION: No acute cardiopulmonary abnormality. Electronically Signed   By: Lovena Le M.D.   On: 12/31/2018 01:55    Procedures Procedures (including critical care time)  Medications Ordered in ED Medications  clindamycin (CLEOCIN) IVPB 600 mg (600 mg Intravenous New Bag/Given 12/31/18 0412)  lactated ringers bolus 1,000 mL (1,000 mLs Intravenous New Bag/Given 12/31/18 0412)  acetaminophen (TYLENOL) tablet 1,000 mg (1,000 mg Oral Given 12/31/18 0406)     Initial Impression / Assessment and Plan / ED Course  I have reviewed the triage vital signs and the nursing notes.  Pertinent labs & imaging results that were available during my care of the patient were reviewed by me and considered in my medical decision making (see chart for details).        Patient presents to the emergency department for evaluation of pain and swelling of the left breast.  She had surgery 1 month ago.  She was seen by Dr. Iran Planas this week for some redness and swelling and started on oral antibiotic.  In the last 24 hours she has had significant worsening.  She has a white count and is febrile and tachycardic.  Lactic acid 2.1.  No hypotension or sign of septic shock.  She has been seen in the ER by Dr. Iran Planas.  She likely will need to go to the OR but will be admitted tonight with IV antibiotics and close observation.  Final Clinical Impressions(s) / ED Diagnoses   Final diagnoses:  Cellulitis of chest wall    ED Discharge Orders    None       Quatisha Zylka, Gwenyth Allegra, MD 12/31/18 0425

## 2018-12-31 NOTE — Progress Notes (Signed)
Pharmacy Antibiotic Note  Jessica [name] is a 53 y.o. female admitted on 12/31/2018 with breast cellulitis.  Pharmacy has been consulted for Vancomycin dosing.  Plan: Vancomycin 1750mg  IV x 1, then 1500mg  IV q24h Expected AUC 497, Scr used 1.19 Levels as needed. F/u Cxs and LOT Monitor clinical course  Height: 5\' 5"  (165.1 cm) Weight: 200 lb (90.7 kg) IBW/kg (Calculated) : 57  Temp (24hrs), Avg:100 F (37.8 C), Min:98.8 F (37.1 C), Max:102.2 F (39 C)  Recent Labs  Lab 12/31/18 0135 12/31/18 0456  WBC 18.4*  --   CREATININE 1.19*  --   LATICACIDVEN 2.1* 1.9    Estimated Creatinine Clearance: 62.2 mL/min (A) (by C-G formula based on SCr of 1.19 mg/dL (H)).    No Known Allergies  Antimicrobials this admission: 9/14 vancomycin >>    Dose adjustments this admission:   Microbiology results:   Timo Hartwig A. Levada Dy, PharmD, BCPS, FNKF Clinical Pharmacist [place] Please utilize Amion for appropriate phone number to reach the unit pharmacist ([place])   12/31/2018 10:25 AM

## 2018-12-31 NOTE — ED Triage Notes (Signed)
Breast reduction surgery one month ago  But since yesteerday her lt breast I red and swollen and she has a temp  No drainage from the lt breast

## 2018-12-31 NOTE — Anesthesia Postprocedure Evaluation (Signed)
Anesthesia Post Note  Patient: Varnika B Costales  Procedure(s) Performed: INCISION AND DRAINAGE BREAST (Left Breast)     Patient location during evaluation: PACU Anesthesia Type: General Level of consciousness: awake and alert Pain management: pain level controlled Vital Signs Assessment: post-procedure vital signs reviewed and stable Respiratory status: spontaneous breathing, nonlabored ventilation, respiratory function stable and patient connected to nasal cannula oxygen Cardiovascular status: blood pressure returned to baseline and stable Postop Assessment: no apparent nausea or vomiting Anesthetic complications: no    Last Vitals:  Vitals:   12/31/18 1350 12/31/18 1652  BP: (!) 104/92 122/70  Pulse: 89 85  Resp: 18 18  Temp: 36.7 C 36.6 C  SpO2: 94% 94%    Last Pain:  Vitals:   12/31/18 1723  TempSrc:   PainSc: 8                  Johnika Escareno COKER

## 2018-12-31 NOTE — Anesthesia Procedure Notes (Signed)
Procedure Name: LMA Insertion Date/Time: 12/31/2018 7:32 AM Performed by: Barrington Ellison, CRNA Pre-anesthesia Checklist: Patient identified, Emergency Drugs available, Suction available and Patient being monitored Patient Re-evaluated:Patient Re-evaluated prior to induction Oxygen Delivery Method: Circle System Utilized Preoxygenation: Pre-oxygenation with 100% oxygen Induction Type: IV induction Ventilation: Mask ventilation without difficulty LMA: LMA inserted LMA Size: 4.0 Number of attempts: 1 Placement Confirmation: positive ETCO2 Tube secured with: Tape Dental Injury: Teeth and Oropharynx as per pre-operative assessment

## 2018-12-31 NOTE — Anesthesia Preprocedure Evaluation (Signed)
Anesthesia Evaluation  Patient identified by MRN, date of birth, ID band Patient awake    Reviewed: Allergy & Precautions, NPO status , Patient's Chart, lab work & pertinent test results  Airway Mallampati: III  TM Distance: >3 FB Neck ROM: Full    Dental  (+) Teeth Intact, Dental Advisory Given   Pulmonary former smoker,    breath sounds clear to auscultation       Cardiovascular  Rhythm:Regular Rate:Normal     Neuro/Psych    GI/Hepatic   Endo/Other    Renal/GU      Musculoskeletal   Abdominal (+) + obese,   Peds  Hematology   Anesthesia Other Findings   Reproductive/Obstetrics                             Anesthesia Physical Anesthesia Plan  ASA: III  Anesthesia Plan: General   Post-op Pain Management:    Induction: Intravenous  PONV Risk Score and Plan: Ondansetron and Dexamethasone  Airway Management Planned: LMA  Additional Equipment:   Intra-op Plan:   Post-operative Plan:   Informed Consent: I have reviewed the patients History and Physical, chart, labs and discussed the procedure including the risks, benefits and alternatives for the proposed anesthesia with the patient or authorized representative who has indicated his/her understanding and acceptance.     Dental advisory given  Plan Discussed with: CRNA and Anesthesiologist  Anesthesia Plan Comments:         Anesthesia Quick Evaluation

## 2018-12-31 NOTE — Op Note (Signed)
Operative Note   DATE OF OPERATION: 9.14.20  LOCATION: [facility]  SURGICAL DIVISION: Plastic Surgery  PREOPERATIVE DIAGNOSES:  1. Cellulitis left breast 2. History breast cancer 3. History therapeutic radiation  POSTOPERATIVE DIAGNOSES:   1. Cellulitis left breast 2. History breast cancer 3. History therapeutic radiation 4. Seroma left breast  PROCEDURE:  Incision drainage seroma left breast  SURGEON: Irene Limbo MD MBA  ASSISTANT: none  ANESTHESIA:  General.   EBL: 20 ml  COMPLICATIONS: None immediate.   INDICATIONS FOR PROCEDURE:  The patient, Jessica [name], is a 53 y.o. female born on 10/01/1965, is here for drainage left breast. Patient is 5 weeks post operative from bilateral mastopexy as part breast reconstruction following lumpectomy and adjuvant radiation. She presents with one day fever and cellulitis left breast.   FINDINGS: Tan watery seroma fluid left breast.  DESCRIPTION OF PROCEDURE:  [name] operative site was marked with the patient in the preoperative area. The patient was taken to the operating room. SCDs were placed. IV antibiotics were administered in ED and no additional antibiotics due. [name] operative site was prepped and draped in a sterile fashion. A time out was performed and all information was confirmed to be correct. Incision made in left lateral inframammary fold and carried through superficial fascia. Seroma fluid encountered. Vertical incision open and cavity noted to extend from superior pole over lateral breast (ie, lateral to superomedial pedicle). Curettage performed over cavity. Cavity irrigated with polymyxin bacitracin solution. Cavity then irrigated with betadine saline solution. 19 Fr drain placed percutaneously and secured with 2-0 nylon. Closure incisions completed with 3-0 monocryl in dermis and interrupted and short running 4-0 nylon skin closure.   The patient was allowed to wake from anesthesia, extubated  and taken to the recovery room in satisfactory condition.   SPECIMENS: aerobic and anaerobic culture  DRAINS: 19 Fr JP in left breast  Irene Limbo, MD [facility] Plastic & Reconstructive Surgery [phone], pin [phone]

## 2018-12-31 NOTE — H&P (Signed)
Jessica [name] is an 53 y.o. female.   Chief Complaint: redness, fevers HPI: Nearly 5 weeks post operative from bilateral mastopexy. Onset erythema left breast starting 1 day ago with progression to fevers over 102 at home and here in ED. Patient with history left breast cancer post lumpectomy and radiation.  Past Medical History:  Diagnosis Date  . Anxiety   . Asthma    denies history of asthma  . 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 Chronic heartburn   . COPD (chronic obstructive pulmonary disease) ([facility]) 2011   does not use inhaler ofter  . Depression   . Family history of anesthesia complication    Had a hard time waking father up only once after several procedures  . GERD (gastroesophageal reflux disease)   . Heart murmur    PMH: at age 8; no longer have murmur  . Hot flashes   . Personal history of radiation therapy   . PVC (premature ventricular contraction)    PMH;at 53 y.o.  Marland Kitchen Shortness of breath    with exertion    Past Surgical History:  Procedure Laterality Date  . ABDOMINAL HYSTERECTOMY    . BREAST BIOPSY    . BREAST LUMPECTOMY Left    2016  . BREAST SURGERY     left breast biopsy x 2  . CYSTOSCOPY N/A 05/24/2013   Procedure: CYSTOSCOPY;  Surgeon: Lavonia Drafts, MD;  Location: [facility];  Service: Gynecology;  Laterality: N/A;  . LAPAROSCOPIC SALPINGO OOPHERECTOMY Bilateral 12/15/2015   Procedure: LAPAROSCOPIC  BILATERAL  OOPHORECTOMY;  Surgeon: Lavonia Drafts, MD;  Location: [facility];  Service: Gynecology;  Laterality: Bilateral;  . MASTOPEXY Bilateral 11/27/2018   Procedure: BILATERAL BREAST MASTOPEXY;  Surgeon: Irene Limbo, MD;  Location: [facility];  Service: Plastics;  Laterality: Bilateral;  . polyp removal     . PORTACATH PLACEMENT Right 01/02/2014   Procedure: INSERTION PORT-A-CATH;  Surgeon: [name], MD;  Location: [facility];  Service: General;   Laterality: Right;  . portacath removal    . RADIOACTIVE SEED GUIDED PARTIAL MASTECTOMY WITH AXILLARY SENTINEL LYMPH NODE BIOPSY Left 05/29/2014   Procedure: RADIOACTIVE SEED GUIDED LEFT PARTIAL MASTECTOMY WITH AXILLARY SENTINEL LYMPH NODE BIOPSY;  Surgeon: [name], MD;  Location: [facility];  Service: General;  Laterality: Left;  . ROBOTIC ASSISTED TOTAL HYSTERECTOMY WITH BILATERAL SALPINGO OOPHERECTOMY Bilateral 05/24/2013   Procedure: ROBOTIC ASSISTED TOTAL HYSTERECTOMY WITH BILATERAL SALPINGECTOMY;  Surgeon: Lavonia Drafts, MD;  Location: [facility];  Service: Gynecology;  Laterality: Bilateral;  . TONSILLECTOMY    . WISDOM TOOTH EXTRACTION      Family History  Problem Relation Age of Onset  . Heart disease Mother   . Hypercholesterolemia Mother   . Heart disease Father   . COPD Father   . Hypercholesterolemia Father   . Cancer Neg Hx    Social History:  reports that she quit smoking about 2 months ago. Her smoking use included cigarettes. She has a 16.00 pack-year smoking history. She has never used smokeless tobacco. She reports current alcohol use. She reports that she does not use drugs.  Allergies: No Known Allergies  (Not in a hospital admission)   Results for orders placed or performed during the hospital encounter of 12/31/18 (from the past 48 hour(s))  Comprehensive metabolic panel     Status: Abnormal   Collection Time: 12/31/18  1:35 AM  Result  Value Ref Range   Sodium 133 (L) 135 - 145 mmol/L   Potassium 3.9 3.5 - 5.1 mmol/L   Chloride 98 98 - 111 mmol/L   CO2 23 22 - 32 mmol/L   Glucose, Bld 113 (H) 70 - 99 mg/dL   BUN 11 6 - 20 mg/dL   Creatinine, Ser 1.19 (H) 0.44 - 1.00 mg/dL   Calcium 9.3 8.9 - 10.3 mg/dL   Total Protein 7.2 6.5 - 8.1 g/dL   Albumin 4.2 3.5 - 5.0 g/dL   AST 14 (L) 15 - 41 U/L   ALT 13 0 - 44 U/L   Alkaline Phosphatase 53 38 - 126 U/L   Total Bilirubin 0.8 0.3 - 1.2 mg/dL   GFR calc non Af Amer 53 (L) >60  mL/min   GFR calc Af Amer >60 >60 mL/min   Anion gap 12 5 - 15    Comment: Performed at [facility] [address] [place], [place] [address]  Lactic acid, plasma     Status: Abnormal   Collection Time: 12/31/18  1:35 AM  Result Value Ref Range   Lactic Acid, Venous 2.1 (HH) 0.5 - 1.9 mmol/L    Comment: CRITICAL RESULT CALLED TO, READ BACK BY AND VERIFIED WITH: R.DENNIS,RN 0225 12/31/2018 M.CAMPBELL Performed at [facility], [place] [address], [place], [place] [address]   CBC with Differential     Status: Abnormal   Collection Time: 12/31/18  1:35 AM  Result Value Ref Range   WBC 18.4 (H) 4.0 - 10.5 K/uL   RBC 4.44 3.87 - 5.11 MIL/uL   Hemoglobin 14.4 12.0 - 15.0 g/dL   HCT 43.6 36.0 - 46.0 %   MCV 98.2 80.0 - 100.0 fL   MCH 32.4 26.0 - 34.0 pg   MCHC 33.0 30.0 - 36.0 g/dL   RDW 13.3 11.5 - 15.5 %   Platelets 168 150 - 400 K/uL   nRBC 0.0 0.0 - 0.2 %   Neutrophils Relative % 81 %   Neutro Abs 14.8 (H) 1.7 - 7.7 K/uL   Lymphocytes Relative 11 %   Lymphs Abs 2.0 0.7 - 4.0 K/uL   Monocytes Relative 7 %   Monocytes Absolute 1.3 (H) 0.1 - 1.0 K/uL   Eosinophils Relative 1 %   Eosinophils Absolute 0.2 0.0 - 0.5 K/uL   Basophils Relative 0 %   Basophils Absolute 0.1 0.0 - 0.1 K/uL   Immature Granulocytes 0 %   Abs Immature Granulocytes 0.07 0.00 - 0.07 K/uL    Comment: Performed at [facility] [address], [place], [place] [address]  Protime-INR     Status: None   Collection Time: 12/31/18  1:35 AM  Result Value Ref Range   Prothrombin Time 14.1 11.4 - 15.2 seconds   INR 1.1 0.8 - 1.2    Comment: (NOTE) INR goal varies based on device and disease states. Performed at [facility], [place] [address], [place], [place] [address]   Urinalysis, Routine w reflex microscopic     Status: Abnormal   Collection Time: 12/31/18  1:35 AM  Result Value Ref Range   Color, Urine AMBER (A) YELLOW    Comment: BIOCHEMICALS MAY BE AFFECTED BY COLOR   APPearance  CLOUDY (A) CLEAR   Specific Gravity, Urine 1.026 1.005 - 1.030   pH 6.0 5.0 - 8.0   Glucose, UA NEGATIVE NEGATIVE mg/dL   Hgb urine dipstick NEGATIVE NEGATIVE   Bilirubin Urine NEGATIVE  NEGATIVE   Ketones, ur NEGATIVE NEGATIVE mg/dL   Protein, ur 30 (A) NEGATIVE mg/dL   Nitrite NEGATIVE NEGATIVE   Leukocytes,Ua LARGE (A) NEGATIVE   RBC / HPF 11-20 0 - 5 RBC/hpf   WBC, UA >50 (H) 0 - 5 WBC/hpf   Bacteria, UA FEW (A) NONE SEEN   Squamous Epithelial / LPF 21-50 0 - 5   Mucus PRESENT    Hyaline Casts, UA PRESENT     Comment: Performed at [facility], [place] [address] [place], [place] [address]  I-Stat beta hCG blood, ED     Status: None   Collection Time: 12/31/18  1:53 AM  Result Value Ref Range   I-stat hCG, quantitative <5.0 <5 mIU/mL   Comment 3            Comment:   GEST. AGE      CONC.  (mIU/mL)   <=1 WEEK        5 - 50     2 WEEKS       50 - 500     3 WEEKS       100 - 10,000     4 WEEKS     1,000 - 30,000        FEMALE AND NON-PREGNANT FEMALE:     LESS THAN 5 mIU/mL    Dg Chest 2 View  Result Date: 12/31/2018 CLINICAL DATA:  Suspected sepsis, breast reduction surgery 1 month prior. Left breast is red and swollen, no drainage EXAM: CHEST - 2 VIEW COMPARISON:  Radiograph 01/02/2014, CT 06/11/2009 FINDINGS: Accounting for body habitus, the lungs are clear. No consolidation, features of edema, pneumothorax, or effusion. Pulmonary vascularity is normally distributed. The cardiomediastinal contours are unremarkable. No acute osseous or soft tissue abnormality. IMPRESSION: No acute cardiopulmonary abnormality. Electronically Signed   By: Lovena Le M.D.   On: 12/31/2018 01:55    ROS  Blood pressure (!) 142/86, pulse (!) 107, temperature 100.3 F (37.9 C), temperature source Oral, resp. rate 18, height 5\' 5"  (1.651 m), weight 90.7 kg, last menstrual period 03/23/2013, SpO2 98 %. Physical Exam  Constitutional: She is oriented to person, place, and time.  Cardiovascular:  Normal rate, regular rhythm and normal heart sounds.  Respiratory: Effort normal and breath sounds normal.  Neurological: She is alert and oriented to person, place, and time.  Breasts: incisions bilateral intact, no drainage, left breast globally swollen with erythema, induration  Assessment/Plan COVID test pending. New cellulitis fevers and increased swelling left breast. Suspect infection seroma or fat necrosis, hematoma would be less likely this far post operative. Reviewed options IV antibiotics and observation, [place] left breast to look for fluid collections, OR for incision/drainage/exploration. I recommend latter give significant fever, elevated WBC and exam breast globally red.   OR this am. Reviewed with patient and husband that additional surgery at this time post operative, in setting radiated breast, and with infection risk wound healing problems, deformity breast are high.   Irene Limbo, MD [facility] Plastic & Reconstructive Surgery [phone], pin [phone]

## 2018-12-31 NOTE — ED Notes (Addendum)
Pts temp has increased, states that she feels "swimmy headed", triage nurse notified.

## 2018-12-31 NOTE — ED Notes (Signed)
Dr. Iran Planas at bedside, pt can go the OR at [phone], report can be called to [phone] prior to patient coming to SS 30. Pt has been consented.

## 2018-12-31 NOTE — ED Triage Notes (Signed)
The pt had advil 2 hours ago  Her lt breast is red swollen and hard with much pain

## 2019-01-01 ENCOUNTER — Encounter (HOSPITAL_COMMUNITY): Payer: Self-pay | Admitting: Plastic Surgery

## 2019-01-01 MED ORDER — OXYCODONE-ACETAMINOPHEN 5-325 MG PO TABS: 1.0000 | ORAL_TABLET | ORAL | 0 refills | Status: AC | PRN

## 2019-01-01 NOTE — Discharge Summary (Signed)
Physician Discharge Summary  Patient ID: Jessica [name] MRN: [id number] DOB/AGE: 1965/11/27 53 y.o.  Admit date: 12/31/2018 Discharge date: 01/01/2019  Admission Diagnoses: Cellulitis left breast, history breast cancer, history therapeutic radiation  Discharge Diagnoses:  same  Discharged Condition: stable  Hospital Course: Presented with one day history fevers and redness left breast. This is 5 weeks following bilateral breast mastopexy reconstruction. Seroma noted at time of surgery and drain placed. Maintained on Vancomycin in hospital and plan Bactrim oral as OP. Culture of fluid with no growth at one day, no organisms on gram stain. No further fevers for > 24 hours post operatively. Will call patient with any changes needed to antibiotics pending cultures.   Treatments: surgery: incision drainage left breast 9.14.20  Discharge Exam: Blood pressure 111/82, pulse 73, temperature 98.1 F (36.7 C), temperature source Oral, resp. rate 16, height 5\' 5"  (1.651 m), weight 90.7 kg, last menstrual period 03/23/2013, SpO2 98 %. Incision/Wound: incisions intact without drainage, JP drain serous, left breast with pink color over superior pole and lateral breast mainly  Disposition: Discharge disposition: 01-Home or Self Care       Discharge Instructions    Call MD for:  redness, tenderness, or signs of infection (pain, swelling, bleeding, redness, odor or green/yellow discharge around incision site)   Complete by: As directed    Call MD for:  temperature >100.5   Complete by: As directed    Discharge instructions   Complete by: As directed    Ok to remove dressings and shower am 9.15.20. Soap and water ok, pat incisions dry. No creams or ointments over incisions. Do not let drain dangle in shower, attach to lanyard or similar.Strip and record drain twice daily and bring log to clinic visit.  Breast binder or soft compression bra for comfort. No underwire bra.  Ok to raise arms above  shoulders for bathing and dressing.  No house yard work or exercise until cleared by MD.   Continue Bactrim as prescribed prior to admission.   Driving Restrictions   Complete by: As directed    No driving if taking narcotics   Lifting restrictions   Complete by: As directed    No lifting > 5-10 lbs with left arm until cleared by MD   Resume previous diet   Complete by: As directed      Allergies as of 01/01/2019   No Known Allergies     Medication List    STOP taking these medications   HYDROcodone-acetaminophen 5-325 MG tablet Commonly known as: NORCO/VICODIN     TAKE these medications   budesonide-formoterol 160-4.5 MCG/ACT inhaler Commonly known as: SYMBICORT Inhale 1 puff into the lungs 2 (two) times daily as needed (for shortness of breath).   calcium carbonate 500 MG chewable tablet Commonly known as: TUMS - dosed in mg elemental calcium Chew 1 tablet by mouth daily as needed for heartburn.   gabapentin 300 MG capsule Commonly known as: NEURONTIN Take 2 capsules (600 mg total) by mouth 2 (two) times daily.   oxyCODONE-acetaminophen 5-325 MG tablet Commonly known as: Percocet Take 1 tablet by mouth every 4 (four) hours as needed for severe pain.   sulfamethoxazole-trimethoprim 800-160 MG tablet Commonly known as: BACTRIM DS Take 1 tablet by mouth 2 (two) times daily.   tamoxifen 20 MG tablet Commonly known as: NOLVADEX Take 1 tablet (20 mg total) by mouth daily.   venlafaxine XR 75 MG 24 hr capsule Commonly known as: EFFEXOR-XR Take 2 capsules (150 mg  total) by mouth daily with breakfast.   zolpidem 5 MG tablet Commonly known as: AMBIEN Take 1 tablet (5 mg total) by mouth at bedtime as needed for sleep.      Follow-up Information    Irene Limbo, MD Follow up on 01/10/2019.   Specialty: Plastic Surgery Why: 10:00 am Contact information: [place] [address] [place] [address] [id number]           Signed: Irene Limbo 01/01/2019, 12:19 PM

## 2019-01-01 NOTE — Progress Notes (Signed)
  Plastic Surgery  POD#1 Incision drainage seroma left breast Bilateral mastopexy 11/27/18  Temp:  [97.8 F (36.6 C)-99.2 F (37.3 C)] 98.2 F (36.8 C) (09/15 0522) Pulse Rate:  [74-99] 74 (09/15 0522) Resp:  [17-23] 17 (09/15 0522) BP: (104-136)/(68-97) 120/68 (09/15 0522) SpO2:  [93 %-99 %] 98 % (09/15 0522)   JP 90  Feeling improved overall, tolerating diet  PE: Incisions intact dry Left breast still with erythema largely over superior and lateral breast JP watery serous Clear to auscultation Normal heart sounds  A/P cellulitis left breast, post drainage seroma Improved exam, no further fevers GM stain no organisms, culture pending Still with erythema chest and recommend continue IV antibiotics-on daily dosing Vancomycin, will check back this pm after dose if suitable for d/c today  Irene Limbo, MD [facility] Plastic & Reconstructive Surgery [phone], pin [phone]

## 2019-01-01 NOTE — Plan of Care (Signed)

## 2019-01-05 LAB — AEROBIC/ANAEROBIC CULTURE W GRAM STAIN (SURGICAL/DEEP WOUND)
Culture: NO GROWTH
Gram Stain: NONE SEEN

## 2019-01-05 LAB — CULTURE, BLOOD (ROUTINE X 2)
Culture: NO GROWTH
Culture: NO GROWTH

## 2019-01-15 ENCOUNTER — Telehealth: Payer: Self-pay | Admitting: *Deleted

## 2019-01-15 NOTE — Telephone Encounter (Signed)
This RN spoke with pt per her call inquiring about need to obtain an " fast "MRI per request by this office.  Noted pt was admitted on 9/14 for breastcellulitis and infection post breast reduction surgery now discharged with open wound healing by secondary intention.  This RN discussed above and need for completion of wound healing.  Pt is scheduled for yearly follow up here in May 2021  Yearly mammogram is due in Dec 2020.  This RN will inquire with MD when " fast " MRI needs to be performed

## 2019-01-17 ENCOUNTER — Other Ambulatory Visit: Payer: Self-pay | Admitting: Oncology

## 2019-01-17 ENCOUNTER — Encounter: Payer: Self-pay | Admitting: Oncology

## 2019-01-17 DIAGNOSIS — Z1231 Encounter for screening mammogram for malignant neoplasm of breast: Secondary | ICD-10-CM

## 2019-01-17 NOTE — Progress Notes (Signed)
I sent Jessica [name] a letter letting her know that I was scheduling a breast MRI for her early December for follow-up of her very high risk breast cancer history.  I urged her to find out how much this is going to cost her out-of-pocket and let us know if the cost is not acceptable to her.

## 2019-03-13 ENCOUNTER — Other Ambulatory Visit: Payer: Self-pay | Admitting: Oncology

## 2019-03-13 DIAGNOSIS — Z9889 Other specified postprocedural states: Secondary | ICD-10-CM

## 2019-04-03 ENCOUNTER — Other Ambulatory Visit: Payer: Medicaid Other

## 2019-04-05 ENCOUNTER — Ambulatory Visit: Payer: Medicaid Other

## 2019-05-10 ENCOUNTER — Other Ambulatory Visit: Payer: Self-pay

## 2019-05-10 ENCOUNTER — Other Ambulatory Visit: Payer: Self-pay | Admitting: Oncology

## 2019-05-10 ENCOUNTER — Ambulatory Visit
Admission: RE | Admit: 2019-05-10 | Discharge: 2019-05-10 | Disposition: A | Discharge status: Home / Self Care | Payer: Medicaid Other | Source: Ambulatory Visit | Attending: Oncology | Admitting: Oncology

## 2019-05-10 DIAGNOSIS — Z9889 Other specified postprocedural states: Secondary | ICD-10-CM

## 2019-05-28 ENCOUNTER — Ambulatory Visit
Admission: RE | Admit: 2019-05-28 | Discharge: 2019-05-28 | Disposition: A | Discharge status: Home / Self Care | Payer: Medicaid Other | Source: Ambulatory Visit | Attending: Oncology | Admitting: Oncology

## 2019-05-28 DIAGNOSIS — Z1231 Encounter for screening mammogram for malignant neoplasm of breast: Secondary | ICD-10-CM

## 2019-05-28 MED ORDER — GADOBUTROL 1 MMOL/ML IV SOLN
10.0000 mL | Freq: Once | INTRAVENOUS | Status: AC | PRN
  Administered 2019-05-28: 10 mL via INTRAVENOUS

## 2019-06-11 ENCOUNTER — Other Ambulatory Visit: Payer: Self-pay | Admitting: Physical Medicine and Rehabilitation

## 2019-06-11 DIAGNOSIS — M545 Low back pain, unspecified: Secondary | ICD-10-CM

## 2019-07-01 ENCOUNTER — Ambulatory Visit
Admission: RE | Admit: 2019-07-01 | Discharge: 2019-07-01 | Disposition: A | Discharge status: Home / Self Care | Payer: Medicaid Other | Source: Ambulatory Visit | Attending: Physical Medicine and Rehabilitation | Admitting: Physical Medicine and Rehabilitation

## 2019-07-01 ENCOUNTER — Other Ambulatory Visit: Payer: Self-pay

## 2019-07-01 DIAGNOSIS — M545 Low back pain, unspecified: Secondary | ICD-10-CM

## 2019-07-02 ENCOUNTER — Other Ambulatory Visit: Payer: Medicaid Other

## 2019-08-21 ENCOUNTER — Other Ambulatory Visit: Payer: Self-pay

## 2019-08-21 DIAGNOSIS — C50412 Malignant neoplasm of upper-outer quadrant of left female breast: Secondary | ICD-10-CM

## 2019-08-22 ENCOUNTER — Inpatient Hospital Stay: Payer: Medicaid Other | Admitting: Oncology

## 2019-08-22 ENCOUNTER — Inpatient Hospital Stay: Payer: Medicaid Other

## 2019-08-22 ENCOUNTER — Other Ambulatory Visit: Payer: Self-pay | Admitting: Oncology

## 2019-08-26 ENCOUNTER — Telehealth: Payer: Self-pay

## 2019-08-26 NOTE — Telephone Encounter (Signed)
[employer] Medicaid recertification form faxed to [facility] @ [facility] Dept. [phone].

## 2019-08-27 ENCOUNTER — Encounter: Payer: Self-pay | Admitting: Adult Health

## 2019-08-27 ENCOUNTER — Telehealth: Payer: Self-pay | Admitting: Licensed Clinical Social Worker

## 2019-08-27 ENCOUNTER — Inpatient Hospital Stay: Payer: Medicaid Other | Attending: Adult Health

## 2019-08-27 ENCOUNTER — Other Ambulatory Visit: Payer: Self-pay

## 2019-08-27 ENCOUNTER — Inpatient Hospital Stay (HOSPITAL_BASED_OUTPATIENT_CLINIC_OR_DEPARTMENT_OTHER): Payer: Medicaid Other | Admitting: Adult Health

## 2019-08-27 VITALS — BP 114/81 | HR 79 | Temp 98.3°F | Resp 18 | Ht 65.0 in | Wt 204.0 lb

## 2019-08-27 DIAGNOSIS — Z923 Personal history of irradiation: Secondary | ICD-10-CM | POA: Diagnosis not present

## 2019-08-27 DIAGNOSIS — Z79899 Other long term (current) drug therapy: Secondary | ICD-10-CM | POA: Diagnosis not present

## 2019-08-27 DIAGNOSIS — C50412 Malignant neoplasm of upper-outer quadrant of left female breast: Secondary | ICD-10-CM

## 2019-08-27 DIAGNOSIS — Z170 Estrogen receptor positive status [ER+]: Secondary | ICD-10-CM

## 2019-08-27 DIAGNOSIS — Z87891 Personal history of nicotine dependence: Secondary | ICD-10-CM | POA: Diagnosis not present

## 2019-08-27 DIAGNOSIS — Z90710 Acquired absence of both cervix and uterus: Secondary | ICD-10-CM | POA: Diagnosis not present

## 2019-08-27 DIAGNOSIS — Z9221 Personal history of antineoplastic chemotherapy: Secondary | ICD-10-CM | POA: Insufficient documentation

## 2019-08-27 DIAGNOSIS — Z79810 Long term (current) use of selective estrogen receptor modulators (SERMs): Secondary | ICD-10-CM | POA: Diagnosis not present

## 2019-08-27 LAB — CMP (CANCER CENTER ONLY)
ALT: 17 U/L (ref 0–44)
AST: 14 U/L — ABNORMAL LOW (ref 15–41)
Albumin: 4 g/dL (ref 3.5–5.0)
Alkaline Phosphatase: 64 U/L (ref 38–126)
Anion gap: 8 (ref 5–15)
BUN: 10 mg/dL (ref 6–20)
CO2: 27 mmol/L (ref 22–32)
Calcium: 9.2 mg/dL (ref 8.9–10.3)
Chloride: 105 mmol/L (ref 98–111)
Creatinine: 0.89 mg/dL (ref 0.44–1.00)
GFR, Est AFR Am: >60 mL/min (ref >60)
GFR, Estimated: >60 mL/min (ref >60)
Glucose, Bld: 104 mg/dL — ABNORMAL HIGH (ref 70–99)
Potassium: 4.2 mmol/L (ref 3.5–5.1)
Sodium: 140 mmol/L (ref 135–145)
Total Bilirubin: 0.4 mg/dL (ref 0.3–1.2)
Total Protein: 7.2 g/dL (ref 6.5–8.1)

## 2019-08-27 LAB — CBC WITH DIFFERENTIAL (CANCER CENTER ONLY)
Abs Immature Granulocytes: 0.02 10*3/uL (ref 0.00–0.07)
Basophils Absolute: 0 10*3/uL (ref 0.0–0.1)
Basophils Relative: 0 %
Eosinophils Absolute: 0.3 10*3/uL (ref 0.0–0.5)
Eosinophils Relative: 3 %
HCT: 44.1 % (ref 36.0–46.0)
Hemoglobin: 14.3 g/dL (ref 12.0–15.0)
Immature Granulocytes: 0 %
Lymphocytes Relative: 20 %
Lymphs Abs: 1.6 10*3/uL (ref 0.7–4.0)
MCH: 30.9 pg (ref 26.0–34.0)
MCHC: 32.4 g/dL (ref 30.0–36.0)
MCV: 95.2 fL (ref 80.0–100.0)
Monocytes Absolute: 0.4 10*3/uL (ref 0.1–1.0)
Monocytes Relative: 5 %
Neutro Abs: 5.9 10*3/uL (ref 1.7–7.7)
Neutrophils Relative %: 72 %
Platelet Count: 183 10*3/uL (ref 150–400)
RBC: 4.63 MIL/uL (ref 3.87–5.11)
RDW: 13.5 % (ref 11.5–15.5)
WBC Count: 8.3 10*3/uL (ref 4.0–10.5)
nRBC: 0 % (ref 0.0–0.2)

## 2019-08-27 NOTE — Telephone Encounter (Signed)
Jessica [name]  Clinical Social [name] was referred by NP Wilber Bihari for questions from patient regarding Medicaid. CSW contacted patient by phone. She had questions about which plan to choose as Medicaid is changing and now need to select a plan. CSW advised patient that CSW cannot tell her which to choose but recommended she look at the [facility] website for information on the plans and to see which plans cover her primary doctors. She voiced understanding and will do so.        Jessica [name], [place], [place] Worker [facility]

## 2019-08-27 NOTE — Progress Notes (Addendum)
Jessica [name]  [phone] [phone]    ID: Jessica [name] DOB: 12-23-1965  MR#: [id number]  KGY#:[id number]  Patient Care Team: Ray Church, NP as PCP - General (Nurse Practitioner) [name], MD as Consulting Physician (General Surgery) Magrinat, Virgie Dad, MD as Consulting Physician (Oncology) Kyung Rudd, MD as Consulting Physician (Radiation Oncology) Melina Schools, MD as Consulting Physician (Orthopedic Surgery) Suella Broad, MD as Consulting Physician (Physical Medicine and Rehabilitation) Lavonia Drafts, MD as Consulting Physician (Obstetrics and Gynecology) Irene Limbo, MD as Consulting Physician (Plastic Surgery)  OTHER: Melina Schools MD, Philis Fendt MD   CHIEF COMPLAINT: triple positive breast cancer  CURRENT TREATMENT: Tamoxifen   INTERVAL HISTORY: Jessica [name] returns today for follow-up and treatment of her estrogen receptor positive breast cancer.   She continues on tamoxifen, with good tolerance. She reports hot flashes but is able to handle them.  Otherwise she has no issues with taking this.  Her most recent mammogram was completed on 05/10/2019 and showed no evidence of malignancy and breast density category D.  She underwent an MRI in 05/2019 that showed no evidence of malignancy and breast density D.    Jessica [name] underwent breast reduction bilaterally on 11/27/2019 by Dr. Iran Planas.  She went from a DD to a B cup.  She was hoping to have reduced pain in her back due to this, however she has not and is going to undergo a spinal fusion at the end of the summer.    REVIEW OF SYSTEMS: Jessica [name] is doing well otherwise.  She is exercising some with some working in the yard.  She sees her PCP regularly and is up to date with colon cancer screening.  She underwent MRI of her spine recently that was negative.    Jessica [name] has had some recent issues with balance and falls, otherwise, she denies any other new concerns and is  overall feeling well.  A detailed ROS was otherwise non contributory.     BREAST CANCER HISTORY: From the original intake note:  Jessica [name] herself palpated a mass in her left breast. She brought it to the attention of [name] at the health department and she was set up for unilateral left mammography and ultrasound 11/27/2013 at the [facility]. This showed a possible distortion in the left breast upper outer quadrant. However the patient's breasts are density category D. On exam there was a firm nodule or mass in the left breast 2:00 location which by ultrasound was irregular and hypoechoic and measured 1.5 cm. There was no left axillary lymphadenopathy noted.  Biopsy of the mass in question 11/29/2013 showed ([id number]) an invasive ductal carcinoma, grade 1, estrogen receptor 94% positive, progesterone receptor 94% positive, both with strong staining intensity, with an MIB-1 of 46%, and HER-2 amplified by CISH, the signals ratio of 4.85 and the number per cell 6.55.  On 12/08/2013 the patient underwent bilateral breast MRI. This showed in the left breast upper outer quadrant a 2.9 cm irregular enhancing mass and extending posteriorly from this an area of clumped non-masslike enhancement extending approximately 6 cm and leading to a posterior group of nodules which were small but measured in aggregate 2.2 cm. In addition, there was a separate 0.8 cm irregular spiculated enhancing mass in the upper inner aspect of the left breast. In the lower outer left breast there was a 0.8 cm oval enhancing mass. There were no abnormal appearing lymph nodes.  [name] subsequent history is as detailed below.   PAST  MEDICAL HISTORY: Past Medical History:  Diagnosis Date  . Anxiety   . Asthma    denies history of asthma  . Breast cancer ([place]) 05/29/14   left breast Lumpectomy=Invasive ductal Carcinoma  . 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 Chronic heartburn   . COPD (chronic obstructive pulmonary  disease) ([place]) 2011   does not use inhaler ofter  . Depression   . Family history of anesthesia complication    Had a hard time waking father up only once after several procedures  . GERD (gastroesophageal reflux disease)   . Heart murmur    PMH: at age 89; no longer have murmur  . Hot flashes   . Personal history of radiation therapy   . PVC (premature ventricular contraction)    PMH;at 54 y.o.  Marland Kitchen Shortness of breath    with exertion    PAST SURGICAL HISTORY: Past Surgical History:  Procedure Laterality Date  . ABDOMINAL HYSTERECTOMY    . BREAST BIOPSY    . BREAST LUMPECTOMY Left    2016  . BREAST SURGERY     left breast biopsy x 2  . CYSTOSCOPY N/A 05/24/2013   Procedure: CYSTOSCOPY;  Surgeon: Lavonia Drafts, MD;  Location: [facility];  Service: Gynecology;  Laterality: N/A;  . IRRIGATION AND DEBRIDEMENT ABSCESS Left 12/31/2018   Procedure: INCISION AND DRAINAGE BREAST;  Surgeon: Irene Limbo, MD;  Location: [facility];  Service: Plastics;  Laterality: Left;  . LAPAROSCOPIC SALPINGO OOPHERECTOMY Bilateral 12/15/2015   Procedure: LAPAROSCOPIC  BILATERAL  OOPHORECTOMY;  Surgeon: Lavonia Drafts, MD;  Location: [facility];  Service: Gynecology;  Laterality: Bilateral;  . MASTOPEXY Bilateral 11/27/2018   Procedure: BILATERAL BREAST MASTOPEXY;  Surgeon: Irene Limbo, MD;  Location: [facility];  Service: Plastics;  Laterality: Bilateral;  . polyp removal     . PORTACATH PLACEMENT Right 01/02/2014   Procedure: INSERTION PORT-A-CATH;  Surgeon: [name], MD;  Location: [facility];  Service: General;  Laterality: Right;  . portacath removal    . RADIOACTIVE SEED GUIDED PARTIAL MASTECTOMY WITH AXILLARY SENTINEL LYMPH NODE BIOPSY Left 05/29/2014   Procedure: RADIOACTIVE SEED GUIDED LEFT PARTIAL MASTECTOMY WITH AXILLARY SENTINEL LYMPH NODE BIOPSY;  Surgeon: [name], MD;  Location: [facility];  Service: General;  Laterality: Left;  .  REDUCTION MAMMAPLASTY Bilateral 11/2018   Per patient path from reduction showed [place]  . ROBOTIC ASSISTED TOTAL HYSTERECTOMY WITH BILATERAL SALPINGO OOPHERECTOMY Bilateral 05/24/2013   Procedure: ROBOTIC ASSISTED TOTAL HYSTERECTOMY WITH BILATERAL SALPINGECTOMY;  Surgeon: Lavonia Drafts, MD;  Location: [facility];  Service: Gynecology;  Laterality: Bilateral;  . TONSILLECTOMY    . WISDOM TOOTH EXTRACTION      FAMILY HISTORY Family History  Problem Relation Age of Onset  . Heart disease Mother   . Hypercholesterolemia Mother   . Heart disease Father   . COPD Father   . Hypercholesterolemia Father   . Cancer Neg Hx   [name] parents are still living, her father is 20 years old and her mother 41 years old as of August 2015. The patient had no brothers, one sister. There is no history of breast or ovarian cancer in the family.  GYNECOLOGIC HISTORY:  Patient's last menstrual period was 03/23/2013. Menarche age 47. She is GX P0. She took oral contraceptives for many years as well as Depo Provera shots. She is status post hysterectomy with bilateral salpingectomy. 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 She scans at work, and she also does all the shelving and stocking. After 11 years livingwith her significant other [name] Day they got married in 2019. He is disabled secondary to multiple orthopedic problems including bilateral avascular necrosis of the hips. He also had a R-body CVA January 2016. Both of them do smoke.     ADVANCED DIRECTIVES: Her husband is her healthcare power of attorney   HEALTH MAINTENANCE: Social History   Tobacco Use  . Smoking status: Former Smoker    Packs/day: 1.00    Years: 16.00    Pack years: 16.00    Types: Cigarettes    Quit date: 10/28/2018    Years since quitting: 0.8  . Smokeless tobacco: Never Used  Substance Use Topics  . Alcohol use: Yes    Alcohol/week: 0.0 standard drinks    Comment:  occasional   . Drug use: No     Colonoscopy:  PAP: January 2015   Bone density:  Lipid panel:  No Known Allergies  Current Outpatient Medications  Medication Sig Dispense Refill  . budesonide-formoterol (SYMBICORT) 160-4.5 MCG/ACT inhaler Inhale 1 puff into the lungs 2 (two) times daily as needed (for shortness of breath).     . calcium carbonate (TUMS - DOSED IN MG ELEMENTAL CALCIUM) 500 MG chewable tablet Chew 1 tablet by mouth daily as needed for heartburn.     . gabapentin (NEURONTIN) 300 MG capsule Take 2 capsules (600 mg total) by mouth 2 (two) times daily. 120 capsule 1  . oxyCODONE-acetaminophen (PERCOCET) 5-325 MG tablet Take 1 tablet by mouth every 4 (four) hours as needed for severe pain. 20 tablet 0  . sulfamethoxazole-trimethoprim (BACTRIM DS) 800-160 MG tablet Take 1 tablet by mouth 2 (two) times daily.    . tamoxifen (NOLVADEX) 20 MG tablet Take 1 tablet (20 mg total) by mouth daily. 90 tablet 3  . venlafaxine XR (EFFEXOR-XR) 75 MG 24 hr capsule Take 2 capsules (150 mg total) by mouth daily with breakfast. 60 capsule 3  . zolpidem (AMBIEN) 5 MG tablet Take 1 tablet (5 mg total) by mouth at bedtime as needed for sleep. 90 tablet 3   No current facility-administered medications for this visit.    OBJECTIVE: middle-aged white woman in no acute distress Vitals:   08/27/19 1403  BP: 114/81  Pulse: 79  Resp: 18  Temp: 98.3 F (36.8 C)  SpO2: 97%     Body mass index is 33.95 kg/m.    ECOG FS:1 - Symptomatic but completely ambulatory  GENERAL: Patient is a well appearing female in no acute distress HEENT:  Sclerae anicteric.  Oropharynx clear and moist. No ulcerations or evidence of oropharyngeal candidiasis. Neck is supple.  NODES:  No cervical, supraclavicular, or axillary lymphadenopathy palpated.  BREAST EXAM: right breast s/p reduction, no sign of local recurrence, left s/p reduction, lumpectomy and radiation, no sign of local recurrence, benign bilateral breast  exam. LUNGS:  Clear to auscultation bilaterally.  No wheezes or rhonchi. HEART:  Regular rate and rhythm. No murmur appreciated. ABDOMEN:  Soft, nontender.  Positive, normoactive bowel sounds. No organomegaly palpated. MSK:  No focal spinal tenderness to palpation. Full range of motion bilaterally in the upper extremities. EXTREMITIES:  No peripheral edema.   SKIN:  Clear with no obvious rashes or skin changes. No nail dyscrasia. NEURO:  Nonfocal. Well oriented.  Appropriate affect.    LAB RESULTS:  CMP     Component Value Date/Time   NA 133 (L)  12/31/2018 0135   NA 143 11/15/2016 0933   K 3.9 12/31/2018 0135   K 3.3 (L) 11/15/2016 0933   CL 98 12/31/2018 0135   CO2 23 12/31/2018 0135   CO2 24 11/15/2016 0933   GLUCOSE 113 (H) 12/31/2018 0135   GLUCOSE 116 11/15/2016 0933   BUN 11 12/31/2018 0135   BUN 9.4 11/15/2016 0933   CREATININE 1.19 (H) 12/31/2018 0135   CREATININE 1.0 11/15/2016 0933   CALCIUM 9.3 12/31/2018 0135   CALCIUM 10.5 (H) 11/15/2016 0933   PROT 7.2 12/31/2018 0135   PROT 6.8 11/15/2016 0933   ALBUMIN 4.2 12/31/2018 0135   ALBUMIN 4.0 11/15/2016 0933   AST 14 (L) 12/31/2018 0135   AST 44 (H) 11/15/2016 0933   ALT 13 12/31/2018 0135   ALT 51 11/15/2016 0933   ALKPHOS 53 12/31/2018 0135   ALKPHOS 57 11/15/2016 0933   BILITOT 0.8 12/31/2018 0135   BILITOT 0.58 11/15/2016 0933   GFRNONAA 53 (L) 12/31/2018 0135   GFRAA >60 12/31/2018 0135    I No results found for: SPEP  Lab Results  Component Value Date   WBC 8.3 08/27/2019   NEUTROABS 5.9 08/27/2019   HGB 14.3 08/27/2019   HCT 44.1 08/27/2019   MCV 95.2 08/27/2019   PLT 183 08/27/2019      Chemistry      Component Value Date/Time   NA 133 (L) 12/31/2018 0135   NA 143 11/15/2016 0933   K 3.9 12/31/2018 0135   K 3.3 (L) 11/15/2016 0933   CL 98 12/31/2018 0135   CO2 23 12/31/2018 0135   CO2 24 11/15/2016 0933   BUN 11 12/31/2018 0135   BUN 9.4 11/15/2016 0933   CREATININE 1.19 (H)  12/31/2018 0135   CREATININE 1.0 11/15/2016 0933      Component Value Date/Time   CALCIUM 9.3 12/31/2018 0135   CALCIUM 10.5 (H) 11/15/2016 0933   ALKPHOS 53 12/31/2018 0135   ALKPHOS 57 11/15/2016 0933   AST 14 (L) 12/31/2018 0135   AST 44 (H) 11/15/2016 0933   ALT 13 12/31/2018 0135   ALT 51 11/15/2016 0933   BILITOT 0.8 12/31/2018 0135   BILITOT 0.58 11/15/2016 0933       No results found for: LABCA2  No components found for: LABCA125  No results for input(s): INR in the last 168 hours.  Urinalysis    Component Value Date/Time   COLORURINE AMBER (A) 12/31/2018 0135   APPEARANCEUR CLOUDY (A) 12/31/2018 0135   LABSPEC 1.026 12/31/2018 0135   LABSPEC 1.030 01/27/2014 1350   PHURINE 6.0 12/31/2018 0135   GLUCOSEU NEGATIVE 12/31/2018 0135   GLUCOSEU Negative 01/27/2014 1350   HGBUR NEGATIVE 12/31/2018 0135   HGBUR negative 10/07/2009 1044   BILIRUBINUR NEGATIVE 12/31/2018 0135   BILIRUBINUR Color Interference 01/27/2014 1350   KETONESUR NEGATIVE 12/31/2018 0135   PROTEINUR 30 (A) 12/31/2018 0135   UROBILINOGEN Color Interference 01/27/2014 1350   NITRITE NEGATIVE 12/31/2018 0135   LEUKOCYTESUR LARGE (A) 12/31/2018 0135   LEUKOCYTESUR Color Interference 01/27/2014 1350    STUDIES: CLINICAL DATA:  54 year old who underwent malignant lumpectomy of the LEFT breast in 2015. Patient underwent BILATERAL reduction mammoplasty in August, 2020, and pathology of the LEFT breast tissue revealed a focus of lobular neoplasia (atypical lobular hyperplasia). The patient had a postoperative seroma in the LEFT breast that was subsequently drained. Annual mammographic evaluation. She also had a benign MRI guided core needle biopsy of the LEFT breast in 2015 with pathology  revealing a tubular adenoma.  EXAM: DIGITAL DIAGNOSTIC BILATERAL MAMMOGRAM WITH CAD AND TOMO  ULTRASOUND LEFT BREAST  COMPARISON:  Previous exam(s).  ACR Breast Density Category d: The breast tissue is  extremely dense, which lowers the sensitivity of mammography.  FINDINGS: Tomosynthesis and synthesized full field CC and MLO views of both breasts were obtained. Standard spot magnification CC view of the lumpectomy site in the LEFT breast was also obtained.  Post surgical changes involving both breasts related to the interval reduction mammoplasty.  Focally dense tissue involving the LOWER OUTER LEFT breast with a question of a partially obscured mass in this location, possibly a seroma, without associated architectural distortion or suspicious calcifications. No suspicious findings elsewhere in the LEFT breast.  No findings suspicious for malignancy in the RIGHT breast.  Mammographic images were processed with CAD.  Targeted LEFT ultrasound is performed, showing focally very dense fibroglandular tissue in the LOWER OUTER QUADRANT. There is no evidence of a recurrent seroma as questioned mammographically. No suspicious solid mass or abnormal acoustic shadowing is identified.  IMPRESSION: 1. No mammographic evidence of malignancy involving either breast. 2. Expected post lumpectomy changes involving the LEFT breast and expected post surgical changes involving both breasts related to the interval reduction mammoplasty.  RECOMMENDATION: Screening mammogram in one year.(Code:SM-B-01Y)  I have discussed the findings and recommendations with the patient. If applicable, a reminder letter will be sent to the patient regarding the next appointment.  BI-RADS CATEGORY  2: Benign.   Electronically Signed   By: Evangeline Dakin M.D.   On: 05/10/2019 12:28  CLINICAL DATA:  History of LEFT breast cancer in 2015 status post lumpectomy and radiation therapy. Subsequent bilateral breast reduction mammoplasty. Pathology review of the breast tissue removed at breast reduction mammoplasty revealed atypical lobular hyperplasia. Postoperative seroma in the LEFT breast  was subsequently drained. Additional history of benign MRI guided breast biopsy in 2015 (tubular adenoma).  LABS:  Not applicable  EXAM: BILATERAL BREAST MRI WITH AND WITHOUT CONTRAST  TECHNIQUE: Multiplanar, multisequence MR images of both breasts were obtained prior to and following the intravenous administration of 10 ml of Gadavist  Three-dimensional MR images were rendered by post-processing of the original MR data on an independent workstation. The three-dimensional MR images were interpreted, and findings are reported in the following complete MRI report for this study. Three dimensional images were evaluated at the independent DynaCad workstation  COMPARISON:  Previous exams including previous breast MRI dated 05/25/2014.  FINDINGS: Breast composition: d. Extreme fibroglandular tissue.  Background parenchymal enhancement: Moderate.  Right breast: Postsurgical changes compatible with the given history of reduction mammoplasty. No suspicious masses, non-mass enhancement or secondary signs of malignancy  Left breast: Postsurgical changes corresponding to the given history of previous lumpectomy and reduction mammoplasty. No suspicious masses, non-mass enhancement or secondary signs of malignancy. No postsurgical seroma.  Lymph nodes: No abnormal appearing lymph nodes.  Ancillary findings:  None.  IMPRESSION: No evidence of malignancy within either breast. Postsurgical changes within each breast.  RECOMMENDATION: 1. Annual screening mammograms. 2. Given patient's personal history of breast cancer and atypical lobular hyperplasia, would consider the addition of annual screening breast MRI to annual screening mammography. Per American Cancer Society guidelines, annual screening MRI of the breasts is recommended if a risk assessment calculation for breast cancer, preferably using the Tyrer-Cuzick model, measures greater than 20%.  BI-RADS CATEGORY   2: Benign.   Electronically Signed   By: Franki Cabot M.D.   On: 05/28/2019 13:47  ASSESSMENT: 54 y.o. BRCA negative [place] woman status post left breast upper outer quadrant biopsy 11/29/2013 for a clinical T2 N0, stage IIA invasive ductal carcinoma, grade 1, triple positive, with an MIB-1 of 46%.  (1) MRI-guided biopsy of two additional areas in the left breast showed only tubular adenomas, no malignancy.   (2) neoadjuvant chemotherapy started 01/14/2014, consisting of carboplatin, docetaxel, trastuzumab and pertuzumab given every 21 days for 6 cycles, with Neulasta on day 2. Completed 04/30/14  (3) trastuzumab continued to total one year, last dose 01/06/2015  (a) most recent echo 10/06/2014 shows a well-preserved ejection fraction  (4) status post left lumpectomy with axillary sentinel lymph node biopsy on 05/29/2014 showing a residual ypT1c ypN0 invasive ductal carcinoma, grade 2, with negative margins   (5) adjuvant radiation completed 08/27/2014  (6) started tamoxifen 09/30/2014, discontinued May 2021  (a) normal bone density scan 08/28/2014  (b) status post remote hysterectomy with bilateral salpingo-oophorectomy  (7) abnormal radiotracer uptake in distal left femur evaluated with CT-guided biopsy 06/20/2014, showing enchondroma, with low cellularity, no mitotic figures, and no atypia.  (8) persistent low pelvic discomfort, with unremarkable transvaginal ultrasonography 12/12/2013 and benign CT of the abdomen and pelvis 08/29/2014  PLAN: Nilam is doing well today.  She has completed 5 years of tamoxifen with good tolerance.  She has no sign of breast cancer recurrence, and met with Dr. Jana Hakim today to discuss stopping the Tamoxifen.  She will go ahead and stop.  It will take about 4 months for her to clear the tamoxifen out of her system.  Zyrah has had some balance issues, and I placed a referral to neuro rehab for balance PT.  Britt has very dense breasts.  She  will undergo breast mammogram in 04/2020, should her density remain the same, we will supplement with breast MRI after her mammogram.  She is in agreement with this plan.  [place] and I discussed f/u with her PCP, healthy diet, exercise, and staying up to date with colon and skin cancer screening.    Torian is not yet ready to graduate.  Instead, she will see [place] in 1 year for labs and LTS follow up.  She was recommended to continue with the appropriate pandemic precautions. She knows to call for any questions that may arise between now and her next appointment.  We are happy to see her sooner if needed.   Total encounter time: 30 minutes  Wilber Bihari, NP 08/27/19 2:27 PM Medical Oncology and Hematology [facility] [place], [place] [address] Tel. [phone]    Fax. [phone]   ADDENDUM: Kyllie has completed 5 years of adjuvant antiestrogens with tamoxifen.  While in some cases we recommend continuing tamoxifen for a total of 10 years, I generally do not favor that in node-negative patients as the benefit is likely to be very small, perhaps in the 1% range in terms of risk reduction, and tamoxifen is not entirely risk-free drug particularly with regards to the possibility of blood clots.  Accordingly we are going off tamoxifen at this time.  We did offer her to "graduation" but like many patients she prefers to be seen on a once a year basis.  This is very reassuring for a lot of our patients and we are happy to accommodate this.  Nevertheless this is time for a "party" and I congratulated her on doing so well as far as her cancer is concerned.  She will see [place] in survivorship a year from  now  I personally saw this patient and performed a substantive portion of this encounter with the listed APP documented above.   [name], MD Medical Oncology and Hematology [facility] [address] [place], [place] [address] Tel. [phone]     Fax. [phone]   *Total Encounter Time as defined by the Centers for Medicare and Medicaid Services includes, in addition to the face-to-face time of a patient visit (documented in the note above) non-face-to-face time: obtaining and reviewing outside history, ordering and reviewing medications, tests or procedures, care coordination (communications with other health care professionals or caregivers) and documentation in the medical record.

## 2019-08-28 ENCOUNTER — Telehealth: Payer: Self-pay | Admitting: Adult Health

## 2019-08-28 NOTE — Telephone Encounter (Signed)
Scheduled appts per 5/11 los. Pt confirmed appt date and time.  

## 2020-03-31 ENCOUNTER — Other Ambulatory Visit: Payer: Self-pay | Admitting: Oncology

## 2020-03-31 DIAGNOSIS — Z1231 Encounter for screening mammogram for malignant neoplasm of breast: Secondary | ICD-10-CM

## 2020-03-31 DIAGNOSIS — C50412 Malignant neoplasm of upper-outer quadrant of left female breast: Secondary | ICD-10-CM

## 2020-05-13 ENCOUNTER — Ambulatory Visit: Payer: Medicaid Other

## 2020-06-06 ENCOUNTER — Ambulatory Visit
Admission: RE | Admit: 2020-06-06 | Discharge: 2020-06-06 | Disposition: A | Discharge status: Home / Self Care | Payer: Medicaid Other | Source: Ambulatory Visit | Attending: Oncology | Admitting: Oncology

## 2020-06-06 ENCOUNTER — Other Ambulatory Visit: Payer: Self-pay

## 2020-06-06 DIAGNOSIS — Z1231 Encounter for screening mammogram for malignant neoplasm of breast: Secondary | ICD-10-CM

## 2020-06-09 ENCOUNTER — Other Ambulatory Visit: Payer: Self-pay | Admitting: Nurse Practitioner

## 2020-06-09 DIAGNOSIS — N63 Unspecified lump in unspecified breast: Secondary | ICD-10-CM

## 2020-06-11 ENCOUNTER — Other Ambulatory Visit: Payer: Self-pay

## 2020-06-11 ENCOUNTER — Ambulatory Visit
Admission: RE | Admit: 2020-06-11 | Discharge: 2020-06-11 | Disposition: A | Discharge status: Home / Self Care | Payer: Medicaid Other | Source: Ambulatory Visit | Attending: Nurse Practitioner | Admitting: Nurse Practitioner

## 2020-06-11 DIAGNOSIS — N63 Unspecified lump in unspecified breast: Secondary | ICD-10-CM

## 2020-06-26 ENCOUNTER — Other Ambulatory Visit: Payer: Medicaid Other

## 2020-07-22 ENCOUNTER — Other Ambulatory Visit: Payer: Medicaid Other

## 2020-08-10 ENCOUNTER — Telehealth: Payer: Self-pay | Admitting: Adult Health

## 2020-08-10 NOTE — Telephone Encounter (Signed)
Rescheduled per provider. Called and spoke with pt confirmed 5/12 appts

## 2020-08-26 ENCOUNTER — Other Ambulatory Visit: Payer: Medicaid Other

## 2020-08-26 ENCOUNTER — Encounter: Payer: Medicaid Other | Admitting: Adult Health

## 2020-08-27 ENCOUNTER — Other Ambulatory Visit: Payer: Self-pay

## 2020-08-27 ENCOUNTER — Inpatient Hospital Stay: Payer: Medicare Other | Attending: Adult Health

## 2020-08-27 ENCOUNTER — Encounter: Payer: Self-pay | Admitting: Adult Health

## 2020-08-27 ENCOUNTER — Inpatient Hospital Stay (HOSPITAL_BASED_OUTPATIENT_CLINIC_OR_DEPARTMENT_OTHER): Payer: Medicare Other | Admitting: Adult Health

## 2020-08-27 VITALS — BP 127/79 | HR 77 | Temp 98.0°F | Resp 18 | Ht 65.0 in | Wt 186.2 lb

## 2020-08-27 DIAGNOSIS — Z87891 Personal history of nicotine dependence: Secondary | ICD-10-CM | POA: Diagnosis not present

## 2020-08-27 DIAGNOSIS — Z90710 Acquired absence of both cervix and uterus: Secondary | ICD-10-CM | POA: Diagnosis not present

## 2020-08-27 DIAGNOSIS — N951 Menopausal and female climacteric states: Secondary | ICD-10-CM | POA: Insufficient documentation

## 2020-08-27 DIAGNOSIS — Z170 Estrogen receptor positive status [ER+]: Secondary | ICD-10-CM | POA: Diagnosis not present

## 2020-08-27 DIAGNOSIS — Z853 Personal history of malignant neoplasm of breast: Secondary | ICD-10-CM | POA: Insufficient documentation

## 2020-08-27 DIAGNOSIS — Z9079 Acquired absence of other genital organ(s): Secondary | ICD-10-CM | POA: Insufficient documentation

## 2020-08-27 DIAGNOSIS — Z90722 Acquired absence of ovaries, bilateral: Secondary | ICD-10-CM | POA: Insufficient documentation

## 2020-08-27 DIAGNOSIS — Z9012 Acquired absence of left breast and nipple: Secondary | ICD-10-CM | POA: Diagnosis not present

## 2020-08-27 DIAGNOSIS — Z923 Personal history of irradiation: Secondary | ICD-10-CM | POA: Diagnosis not present

## 2020-08-27 DIAGNOSIS — Z9221 Personal history of antineoplastic chemotherapy: Secondary | ICD-10-CM | POA: Insufficient documentation

## 2020-08-27 DIAGNOSIS — C50412 Malignant neoplasm of upper-outer quadrant of left female breast: Secondary | ICD-10-CM

## 2020-08-27 LAB — CBC WITH DIFFERENTIAL (CANCER CENTER ONLY)
Abs Immature Granulocytes: 0.02 10*3/uL (ref 0.00–0.07)
Basophils Absolute: 0 10*3/uL (ref 0.0–0.1)
Basophils Relative: 1 %
Eosinophils Absolute: 0.4 10*3/uL (ref 0.0–0.5)
Eosinophils Relative: 6 %
HCT: 39.5 % (ref 36.0–46.0)
Hemoglobin: 13.6 g/dL (ref 12.0–15.0)
Immature Granulocytes: 0 %
Lymphocytes Relative: 31 %
Lymphs Abs: 1.9 10*3/uL (ref 0.7–4.0)
MCH: 31.6 pg (ref 26.0–34.0)
MCHC: 34.4 g/dL (ref 30.0–36.0)
MCV: 91.6 fL (ref 80.0–100.0)
Monocytes Absolute: 0.4 10*3/uL (ref 0.1–1.0)
Monocytes Relative: 7 %
Neutro Abs: 3.3 10*3/uL (ref 1.7–7.7)
Neutrophils Relative %: 55 %
Platelet Count: 161 10*3/uL (ref 150–400)
RBC: 4.31 MIL/uL (ref 3.87–5.11)
RDW: 13.2 % (ref 11.5–15.5)
WBC Count: 6.1 10*3/uL (ref 4.0–10.5)
nRBC: 0 % (ref 0.0–0.2)

## 2020-08-27 LAB — CMP (CANCER CENTER ONLY)
ALT: 12 U/L (ref 0–44)
AST: 13 U/L — ABNORMAL LOW (ref 15–41)
Albumin: 3.9 g/dL (ref 3.5–5.0)
Alkaline Phosphatase: 98 U/L (ref 38–126)
Anion gap: 9 (ref 5–15)
BUN: 14 mg/dL (ref 6–20)
CO2: 27 mmol/L (ref 22–32)
Calcium: 9.6 mg/dL (ref 8.9–10.3)
Chloride: 108 mmol/L (ref 98–111)
Creatinine: 1.14 mg/dL — ABNORMAL HIGH (ref 0.44–1.00)
GFR, Estimated: 58 mL/min — ABNORMAL LOW (ref >60)
Glucose, Bld: 67 mg/dL — ABNORMAL LOW (ref 70–99)
Potassium: 3.7 mmol/L (ref 3.5–5.1)
Sodium: 144 mmol/L (ref 135–145)
Total Bilirubin: 0.3 mg/dL (ref 0.3–1.2)
Total Protein: 6.8 g/dL (ref 6.5–8.1)

## 2020-08-27 NOTE — Progress Notes (Signed)
[name]  Telephone:(336) [phone] Fax:(336) [phone]    ID: Jessica [name] DOB: 07/13/1965  MR#: [id number]  VHQ#:[id number]  Patient Care Team: Ray Church, NP as PCP - General (Nurse Practitioner) [name], MD as Consulting Physician (General Surgery) Magrinat, Virgie Dad, MD as Consulting Physician (Oncology) Kyung Rudd, MD as Consulting Physician (Radiation Oncology) Melina Schools, MD as Consulting Physician (Orthopedic Surgery) Suella Broad, MD as Consulting Physician (Physical Medicine and Rehabilitation) Lavonia Drafts, MD as Consulting Physician (Obstetrics and Gynecology) Irene Limbo, MD as Consulting Physician (Plastic Surgery)  OTHER: Melina Schools MD, Philis Fendt MD   CHIEF COMPLAINT: triple positive breast cancer  CURRENT TREATMENT: Tamoxifen   INTERVAL HISTORY: [name] returns today for follow-up and treatment of her estrogen receptor positive breast cancer.   Her most recent mammogram was completed on 06/11/2020 and showed no evidence of malignancy.  She had breast density category D.   She stopped tamoxifen last year and has noticed an improvement in her hot flashes since stopping.  She is feeling well.    REVIEW OF SYSTEMS: [name] has no concerns today.  She exercises by walking her dog.  She sees her PCP regularly, and is up to date with colon and skin cancer screening.  A detailed ROS Was otherwise non contributory today.     BREAST CANCER HISTORY: From the original intake note:  [name] herself palpated a mass in her left breast. She brought it to the attention of [name] at the health department and she was set up for unilateral left mammography and ultrasound 11/27/2013 at the [facility]. This showed a possible distortion in the left breast upper outer quadrant. However the patient's breasts are density category D. On exam there was a firm nodule or mass in the left breast 2:00 location which by  ultrasound was irregular and hypoechoic and measured 1.5 cm. There was no left axillary lymphadenopathy noted.  Biopsy of the mass in question 11/29/2013 showed ([id number]) an invasive ductal carcinoma, grade 1, estrogen receptor 94% positive, progesterone receptor 94% positive, both with strong staining intensity, with an MIB-1 of 46%, and HER-2 amplified by CISH, the signals ratio of 4.85 and the number per cell 6.55.  On 12/08/2013 the patient underwent bilateral breast MRI. This showed in the left breast upper outer quadrant a 2.9 cm irregular enhancing mass and extending posteriorly from this an area of clumped non-masslike enhancement extending approximately 6 cm and leading to a posterior group of nodules which were small but measured in aggregate 2.2 cm. In addition, there was a separate 0.8 cm irregular spiculated enhancing mass in the upper inner aspect of the left breast. In the lower outer left breast there was a 0.8 cm oval enhancing mass. There were no abnormal appearing lymph nodes.  [name] subsequent history is as detailed below.   PAST MEDICAL HISTORY: Past Medical History:  Diagnosis Date  . Anxiety   . Asthma    denies history of asthma  . Breast cancer ([place]) 05/29/14   left breast Lumpectomy=Invasive ductal Carcinoma  . 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 Chronic heartburn   . COPD (chronic obstructive pulmonary disease) ([place]) 2011   does not use inhaler ofter  . Depression   . Family history of anesthesia complication    Had a hard time waking father up only once after several procedures  . GERD (gastroesophageal reflux disease)   . Heart murmur    PMH: at age 10; no longer have  murmur  . Hot flashes   . Personal history of radiation therapy   . PVC (premature ventricular contraction)    PMH;at 55 y.o.  Marland Kitchen Shortness of breath    with exertion    PAST SURGICAL HISTORY: Past Surgical History:  Procedure Laterality Date  . ABDOMINAL HYSTERECTOMY    . BREAST  BIOPSY    . BREAST LUMPECTOMY Left    2016  . BREAST SURGERY     left breast biopsy x 2  . CYSTOSCOPY N/A 05/24/2013   Procedure: CYSTOSCOPY;  Surgeon: Lavonia Drafts, MD;  Location: [facility];  Service: Gynecology;  Laterality: N/A;  . IRRIGATION AND DEBRIDEMENT ABSCESS Left 12/31/2018   Procedure: INCISION AND DRAINAGE BREAST;  Surgeon: Irene Limbo, MD;  Location: [facility];  Service: Plastics;  Laterality: Left;  . LAPAROSCOPIC SALPINGO OOPHERECTOMY Bilateral 12/15/2015   Procedure: LAPAROSCOPIC  BILATERAL  OOPHORECTOMY;  Surgeon: Lavonia Drafts, MD;  Location: [facility];  Service: Gynecology;  Laterality: Bilateral;  . MASTOPEXY Bilateral 11/27/2018   Procedure: BILATERAL BREAST MASTOPEXY;  Surgeon: Irene Limbo, MD;  Location: [facility];  Service: Plastics;  Laterality: Bilateral;  . polyp removal     . PORTACATH PLACEMENT Right 01/02/2014   Procedure: INSERTION PORT-A-CATH;  Surgeon: [name], MD;  Location: [facility];  Service: General;  Laterality: Right;  . portacath removal    . RADIOACTIVE SEED GUIDED PARTIAL MASTECTOMY WITH AXILLARY SENTINEL LYMPH NODE BIOPSY Left 05/29/2014   Procedure: RADIOACTIVE SEED GUIDED LEFT PARTIAL MASTECTOMY WITH AXILLARY SENTINEL LYMPH NODE BIOPSY;  Surgeon: [name], MD;  Location: [facility];  Service: General;  Laterality: Left;  . REDUCTION MAMMAPLASTY Bilateral 11/2018   Per patient path from reduction showed [place]  . ROBOTIC ASSISTED TOTAL HYSTERECTOMY WITH BILATERAL SALPINGO OOPHERECTOMY Bilateral 05/24/2013   Procedure: ROBOTIC ASSISTED TOTAL HYSTERECTOMY WITH BILATERAL SALPINGECTOMY;  Surgeon: Lavonia Drafts, MD;  Location: [facility];  Service: Gynecology;  Laterality: Bilateral;  . TONSILLECTOMY    . WISDOM TOOTH EXTRACTION      FAMILY HISTORY Family History  Problem Relation Age of Onset  . Heart disease Mother   . Hypercholesterolemia Mother   . Heart disease Father   . COPD  Father   . Hypercholesterolemia Father   . Cancer Neg Hx   [name] parents are still living, her father is 64 years old and her mother 62 years old as of August 2015. The patient had no brothers, one sister. There is no history of breast or ovarian cancer in the family.  GYNECOLOGIC HISTORY:  Patient's last menstrual period was 03/23/2013. Menarche age 49. She is GX P0. She took oral contraceptives for many years as well as Depo Provera shots. She is status post hysterectomy with bilateral salpingectomy. 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 She scans at work, and she also does all the shelving and stocking. After 11 years livingwith her significant other [name] Day they got married in 2019. He is disabled secondary to multiple orthopedic problems including bilateral avascular necrosis of the hips. He also had a R-body CVA January 2016. Both of them do smoke.     ADVANCED DIRECTIVES: Her husband is her healthcare power of attorney   HEALTH MAINTENANCE: Social History   Tobacco Use  . Smoking status: Former Smoker    Packs/day: 1.00    Years: 16.00    Pack years: 16.00    Types: Cigarettes  Quit date: 10/28/2018    Years since quitting: 1.8  . Smokeless tobacco: Never Used  Substance Use Topics  . Alcohol use: Yes    Alcohol/week: 0.0 standard drinks    Comment: occasional   . Drug use: No     Colonoscopy:  PAP: January 2015   Bone density:  Lipid panel:  No Known Allergies  Current Outpatient Medications  Medication Sig Dispense Refill  . budesonide-formoterol (SYMBICORT) 160-4.5 MCG/ACT inhaler Inhale 1 puff into the lungs 2 (two) times daily as needed (for shortness of breath).     . calcium carbonate (TUMS - DOSED IN MG ELEMENTAL CALCIUM) 500 MG chewable tablet Chew 1 tablet by mouth daily as needed for heartburn.     . gabapentin (NEURONTIN) 300 MG capsule Take 2 capsules (600 mg total) by mouth 2 (two) times  daily. 120 capsule 1  . venlafaxine XR (EFFEXOR-XR) 75 MG 24 hr capsule Take 2 capsules (150 mg total) by mouth daily with breakfast. 60 capsule 3  . zolpidem (AMBIEN) 5 MG tablet Take 1 tablet (5 mg total) by mouth at bedtime as needed for sleep. 90 tablet 3   No current facility-administered medications for this visit.    OBJECTIVE: middle-aged white woman in no acute distress Vitals:   08/27/20 1157  BP: 127/79  Pulse: 77  Resp: 18  Temp: 98 F (36.7 C)  SpO2: 98%     Body mass index is 30.99 kg/m.    ECOG FS:1 - Symptomatic but completely ambulatory  GENERAL: Patient is a well appearing female in no acute distress HEENT:  Sclerae anicteric.  Oropharynx clear and moist. No ulcerations or evidence of oropharyngeal candidiasis. Neck is supple.  NODES:  No cervical, supraclavicular, or axillary lymphadenopathy palpated.  BREAST EXAM: right breast s/p reduction, no sign of local recurrence, left s/p reduction, lumpectomy and radiation, no sign of local recurrence, benign bilateral breast exam. LUNGS:  Clear to auscultation bilaterally.  No wheezes or rhonchi. HEART:  Regular rate and rhythm. No murmur appreciated. ABDOMEN:  Soft, nontender.  Positive, normoactive bowel sounds. No organomegaly palpated. MSK:  No focal spinal tenderness to palpation. Full range of motion bilaterally in the upper extremities. EXTREMITIES:  No peripheral edema.   SKIN:  Clear with no obvious rashes or skin changes. No nail dyscrasia. NEURO:  Nonfocal. Well oriented.  Appropriate affect.    LAB RESULTS:  CMP     Component Value Date/Time   NA 144 08/27/2020 1138   NA 143 11/15/2016 0933   K 3.7 08/27/2020 1138   K 3.3 (L) 11/15/2016 0933   CL 108 08/27/2020 1138   CO2 27 08/27/2020 1138   CO2 24 11/15/2016 0933   GLUCOSE 67 (L) 08/27/2020 1138   GLUCOSE 116 11/15/2016 0933   BUN 14 08/27/2020 1138   BUN 9.4 11/15/2016 0933   CREATININE 1.14 (H) 08/27/2020 1138   CREATININE 1.0 11/15/2016  0933   CALCIUM 9.6 08/27/2020 1138   CALCIUM 10.5 (H) 11/15/2016 0933   PROT 6.8 08/27/2020 1138   PROT 6.8 11/15/2016 0933   ALBUMIN 3.9 08/27/2020 1138   ALBUMIN 4.0 11/15/2016 0933   AST 13 (L) 08/27/2020 1138   AST 44 (H) 11/15/2016 0933   ALT 12 08/27/2020 1138   ALT 51 11/15/2016 0933   ALKPHOS 98 08/27/2020 1138   ALKPHOS 57 11/15/2016 0933   BILITOT 0.3 08/27/2020 1138   BILITOT 0.58 11/15/2016 0933   GFRNONAA 58 (L) 08/27/2020 1138   GFRAA >60 08/27/2019  1349    I No results found for: SPEP  Lab Results  Component Value Date   WBC 6.1 08/27/2020   NEUTROABS 3.3 08/27/2020   HGB 13.6 08/27/2020   HCT 39.5 08/27/2020   MCV 91.6 08/27/2020   PLT 161 08/27/2020      Chemistry      Component Value Date/Time   NA 144 08/27/2020 1138   NA 143 11/15/2016 0933   K 3.7 08/27/2020 1138   K 3.3 (L) 11/15/2016 0933   CL 108 08/27/2020 1138   CO2 27 08/27/2020 1138   CO2 24 11/15/2016 0933   BUN 14 08/27/2020 1138   BUN 9.4 11/15/2016 0933   CREATININE 1.14 (H) 08/27/2020 1138   CREATININE 1.0 11/15/2016 0933      Component Value Date/Time   CALCIUM 9.6 08/27/2020 1138   CALCIUM 10.5 (H) 11/15/2016 0933   ALKPHOS 98 08/27/2020 1138   ALKPHOS 57 11/15/2016 0933   AST 13 (L) 08/27/2020 1138   AST 44 (H) 11/15/2016 0933   ALT 12 08/27/2020 1138   ALT 51 11/15/2016 0933   BILITOT 0.3 08/27/2020 1138   BILITOT 0.58 11/15/2016 0933       No results found for: LABCA2  No components found for: LABCA125  No results for input(s): INR in the last 168 hours.  Urinalysis    Component Value Date/Time   COLORURINE AMBER (A) 12/31/2018 0135   APPEARANCEUR CLOUDY (A) 12/31/2018 0135   LABSPEC 1.026 12/31/2018 0135   LABSPEC 1.030 01/27/2014 1350   PHURINE 6.0 12/31/2018 0135   GLUCOSEU NEGATIVE 12/31/2018 0135   GLUCOSEU Negative 01/27/2014 1350   HGBUR NEGATIVE 12/31/2018 0135   HGBUR negative 10/07/2009 1044   BILIRUBINUR NEGATIVE 12/31/2018 0135    BILIRUBINUR Color Interference 01/27/2014 1350   KETONESUR NEGATIVE 12/31/2018 0135   PROTEINUR 30 (A) 12/31/2018 0135   UROBILINOGEN Color Interference 01/27/2014 1350   NITRITE NEGATIVE 12/31/2018 0135   LEUKOCYTESUR LARGE (A) 12/31/2018 0135   LEUKOCYTESUR Color Interference 01/27/2014 1350    STUDIES: CLINICAL DATA:  Patient describes a palpable lump within the upper LEFT breast. History of LEFT breast cancer in 2016 status post lumpectomy. History of RIGHT breast reduction surgery.  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  EXAM: DIGITAL DIAGNOSTIC BILATERAL MAMMOGRAM WITH TOMOSYNTHESIS AND CAD; ULTRASOUND LEFT BREAST LIMITED  TECHNIQUE: Bilateral digital diagnostic mammography and breast tomosynthesis was performed. The images were evaluated with computer-aided detection.; Targeted ultrasound examination of the left breast was performed  COMPARISON:  Previous exam(s).  ACR Breast Density Category d: The breast tissue is extremely dense, which lowers the sensitivity of mammography.  FINDINGS: Bilateral diagnostic mammogram. There are stable postsurgical changes within each breast. There are no new dominant masses, suspicious calcifications or other secondary signs of malignancy within either breast.  Targeted ultrasound is performed, evaluating the upper inner quadrant of the LEFT breast with particular attention to the 10 o'clock axis as directed by the patient, again showing the nearly anechoic mass in the LEFT breast at the 10 o'clock axis, 8 cm from the nipple, with prominent internal vascularity suggesting chronic partially thrombosed pseudoaneurysm, stable in size compared to earlier ultrasound of 09/24/2015 confirming benignity. This corresponds to the palpable area of concern. No additional findings are identified in this  area by ultrasound.  IMPRESSION: 1. No evidence of malignancy within either breast. 2. Stable benign mass  within the LEFT breast at the 10 o'clock axis, with prominent internal vascularity suggesting chronic partially thrombosed pseudoaneurysm, stable for nearly 5 years confirming benignity, corresponding to the palpable area of concern. 3. Stable postsurgical changes within each breast.  RECOMMENDATION: 1.  Screening mammogram in one year.(Code:SM-B-01Y) 2. The patient was instructed to return sooner if the area that she feels becomes larger and/or firmer to palpation, or if a new palpable abnormality is identified in either breast.  I have discussed the findings and recommendations with the patient. If applicable, a reminder letter will be sent to the patient regarding the next appointment.  BI-RADS CATEGORY  2: Benign.   Electronically Signed   By: Franki Cabot M.D.   On: 06/11/2020 15:59  ASSESSMENT: 55 y.o. BRCA negative [place] woman status post left breast upper outer quadrant biopsy 11/29/2013 for a clinical T2 N0, stage IIA invasive ductal carcinoma, grade 1, triple positive, with an MIB-1 of 46%.  (1) MRI-guided biopsy of two additional areas in the left breast showed only tubular adenomas, no malignancy.   (2) neoadjuvant chemotherapy started 01/14/2014, consisting of carboplatin, docetaxel, trastuzumab and pertuzumab given every 21 days for 6 cycles, with Neulasta on day 2. Completed 04/30/14  (3) trastuzumab continued to total one year, last dose 01/06/2015  (a) most recent echo 10/06/2014 shows a well-preserved ejection fraction  (4) status post left lumpectomy with axillary sentinel lymph node biopsy on 05/29/2014 showing a residual ypT1c ypN0 invasive ductal carcinoma, grade 2, with negative margins   (5) adjuvant radiation completed 08/27/2014  (6) started tamoxifen 09/30/2014, discontinued May 2021  (a) normal bone density scan 08/28/2014  (b)  status post remote hysterectomy with bilateral salpingo-oophorectomy  (7) abnormal radiotracer uptake in distal left femur evaluated with CT-guided biopsy 06/20/2014, showing enchondroma, with low cellularity, no mitotic figures, and no atypia.  (8) persistent low pelvic discomfort, with unremarkable transvaginal ultrasonography 12/12/2013 and benign CT of the abdomen and pelvis 08/29/2014  PLAN: Linea is doing quite well today.  She has no clinical or radiographic concern of breast cancer recurrence. We discussed doing mammogram alternating with breast MRI considering her  breast density.  I placed orders for this to occur in 11/2020.    Sabrine and I discussed health maintenance in detail.  She is going to work on eliminating sugary beverages, and increasing exercise.  She is following up with her PCP and her cancer screenings are up to date.    I reviewed her labs in detail.  I suggested she drink more water as her kidney function is slightly elevated.  We will see her back in 1 year for continued surveillance and monitoring.  She knows to call for any questions that may arise between now and her next appointment.  We are happy to see her sooner if needed.   Total encounter time: 30 minutes* spent in face to face visit with the patient, reviewing her imaging, placing orders, and documenting the visit.    Wilber Bihari, NP 08/27/20 12:36 PM Medical Oncology and Hematology [facility] [address] [place], [place] [address] Tel. [phone]    Fax. [phone]  *Total Encounter Time as defined by the Centers for Medicare and Medicaid Services includes, in addition to the face-to-face time of a patient visit (documented in the note above) non-face-to-face time: obtaining and reviewing outside history, ordering and reviewing medications, tests or procedures, care coordination (communications with other health care professionals or caregivers) and documentation in the medical  record.

## 2020-08-31 ENCOUNTER — Telehealth: Payer: Self-pay | Admitting: Adult Health

## 2020-08-31 NOTE — Telephone Encounter (Signed)
Sch per 5/12 los, pt aware 

## 2021-06-07 ENCOUNTER — Other Ambulatory Visit: Payer: Self-pay | Admitting: Nurse Practitioner

## 2021-06-07 DIAGNOSIS — Z1231 Encounter for screening mammogram for malignant neoplasm of breast: Secondary | ICD-10-CM

## 2021-06-16 ENCOUNTER — Ambulatory Visit
Admission: RE | Admit: 2021-06-16 | Discharge: 2021-06-16 | Disposition: A | Discharge status: Home / Self Care | Payer: Medicaid Other | Source: Ambulatory Visit | Attending: Nurse Practitioner | Admitting: Nurse Practitioner

## 2021-06-16 DIAGNOSIS — Z1231 Encounter for screening mammogram for malignant neoplasm of breast: Secondary | ICD-10-CM

## 2021-08-10 ENCOUNTER — Telehealth: Payer: Self-pay | Admitting: Adult Health

## 2021-08-10 NOTE — Telephone Encounter (Signed)
Rescheduled appointment per provider template. Patient is aware of the changes made to her upcoming appointment 

## 2021-08-27 ENCOUNTER — Inpatient Hospital Stay: Payer: 59 | Attending: Adult Health | Admitting: Adult Health

## 2021-08-27 ENCOUNTER — Encounter: Payer: Self-pay | Admitting: Adult Health

## 2021-08-27 ENCOUNTER — Other Ambulatory Visit: Payer: Self-pay

## 2021-08-27 VITALS — BP 134/85 | HR 80 | Temp 98.1°F | Resp 17 | Ht 65.0 in | Wt 164.9 lb

## 2021-08-27 DIAGNOSIS — Z87891 Personal history of nicotine dependence: Secondary | ICD-10-CM

## 2021-08-27 DIAGNOSIS — Z170 Estrogen receptor positive status [ER+]: Secondary | ICD-10-CM

## 2021-08-27 DIAGNOSIS — Z9221 Personal history of antineoplastic chemotherapy: Secondary | ICD-10-CM | POA: Insufficient documentation

## 2021-08-27 DIAGNOSIS — C50412 Malignant neoplasm of upper-outer quadrant of left female breast: Secondary | ICD-10-CM

## 2021-08-27 DIAGNOSIS — Z923 Personal history of irradiation: Secondary | ICD-10-CM | POA: Diagnosis not present

## 2021-08-27 DIAGNOSIS — Z853 Personal history of malignant neoplasm of breast: Secondary | ICD-10-CM | POA: Diagnosis present

## 2021-08-27 NOTE — Progress Notes (Signed)
?[facility]  ?[phone] [phone]  ? ? ?ID: Jessica [name] DOB: 06/05/1965  MR#: [id number]  CSN#:[id number] ? ?Patient Care Team: ?Blackburn, Helen, NP as PCP - General (Nurse Practitioner) ?Wakefield, Matthew, MD as Consulting Physician (General Surgery) ?Moody, John, MD as Consulting Physician (Radiation Oncology) ?Brooks, Dahari, MD as Consulting Physician (Orthopedic Surgery) ?Ramos, Richard, MD as Consulting Physician (Physical Medicine and Rehabilitation) ?Harraway-Smith, Carolyn, MD as Consulting Physician (Obstetrics and Gynecology) ?Thimmappa, Brinda, MD as Consulting Physician (Plastic Surgery) ?Causey, Lindsey Cornetto, NP as Nurse Practitioner (Hematology and Oncology)  ?OTHER: Dahari Brooks MD, Scot Wilson MD ? ? ?CHIEF COMPLAINT: triple positive breast cancer ? ?CURRENT TREATMENT: Tamoxifen ? ? ?INTERVAL HISTORY: ?Jessica [name] returns today for follow-up and treatment of her estrogen receptor positive breast cancer.  ? ?Her most recent mammogram was completed on June 17, 2021 and showed no mammographic evidence of malignancy she had breast density category D.  Screening mammogram was recommended in 1 year which would be due in March 2024. ? ?Considering her breast density at her last appointment in February I suggested a breast MRI in August 2023.  She did not have this completed.  She is confused as to why it did not get scheduled, she says that she never received a call. ? ?She sees her primary care provider regularly.  She is up-to-date with colon cancer screening.  She no longer has to have Pap smears because she is status post hysterectomy with normal pathology.  She has skin cancer screening with her primary care provider.  She does also have a 20-pack-year tobacco history quitting last year. ? ?Overall she is feeling well.  She says she has a healthy diet and is exercising regularly. ? ?REVIEW OF SYSTEMS: ?Review of Systems  ?Constitutional:  Negative for appetite  change, chills, fatigue, fever and unexpected weight change.  ?HENT:   Negative for hearing loss, lump/mass and trouble swallowing.   ?Eyes:  Negative for eye problems and icterus.  ?Respiratory:  Negative for chest tightness, cough and shortness of breath.   ?Cardiovascular:  Negative for chest pain, leg swelling and palpitations.  ?Gastrointestinal:  Negative for abdominal distention, abdominal pain, constipation, diarrhea, nausea and vomiting.  ?Endocrine: Negative for hot flashes.  ?Genitourinary:  Negative for difficulty urinating.   ?Musculoskeletal:  Negative for arthralgias.  ?Skin:  Negative for itching and rash.  ?Neurological:  Negative for dizziness, extremity weakness, headaches and numbness.  ?Hematological:  Negative for adenopathy. Does not bruise/bleed easily.  ?Psychiatric/Behavioral:  Negative for depression. The patient is not nervous/anxious.   ? ?BREAST CANCER HISTORY: ?From the original intake note: ? ?Kiante herself palpated a mass in her left breast. She brought it to the attention of [facility]ina [facility] at the [facility] and she was set up for unilateral left mammography and ultrasound 11/27/2013 [facility]. This showed a possible distortion in the left breast upper outer quadrant. However the patient's breasts are density category D. On exam there was a firm nodule or mass in the left breast 2:00 location which by ultrasound was irregular and hypoechoic and measured 1.5 cm. There was no left axillary lymphadenopathy noted. ? ?Biopsy of the mass in question 11/29/2013 showed ([id number]) an invasive ductal carcinoma, grade 1, estrogen receptor 94% positive, progesterone receptor 94% positive, both with strong staining intensity, with an MIB-1 of 46%, and HER-2 amplified by CISH, the signals ratio of 4.85 and the number per cell 6.55. ? ?On 12/08/2013 the patient underwent bilateral breast   MRI. This showed in the left breast upper outer quadrant a 2.9 cm irregular enhancing  mass and extending posteriorly from this an area of clumped non-masslike enhancement extending approximately 6 cm and leading to a posterior group of nodules which were small but measured in aggregate 2.2 cm. In addition, there was a separate 0.8 cm irregular spiculated enhancing mass in the upper inner aspect of the left breast. In the lower outer left breast there was a 0.8 cm oval enhancing mass. There were no abnormal appearing lymph nodes. ? ?[name] subsequent history is as detailed below. ? ? ?PAST MEDICAL HISTORY: ?Past Medical History:  ?Diagnosis Date  ? Anxiety   ? Asthma   ? denies history of asthma  ? Breast cancer (HCC) 05/29/14  ? left breast Lumpectomy=Invasive ductal Carcinoma  ? Cancer (HCC)   ? DCIS  ? Chronic heartburn   ? COPD (chronic obstructive pulmonary disease) (HCC) 2011  ? does not use inhaler ofter  ? Depression   ? Family history of anesthesia complication   ? Had a hard time waking father up only once after several procedures  ? GERD (gastroesophageal reflux disease)   ? Heart murmur   ? PMH: at age 20; no longer have murmur  ? Hot flashes   ? Personal history of radiation therapy   ? PVC (premature ventricular contraction)   ? PMH;at 56 y.o.  ? Shortness of breath   ? with exertion  ? ? ?PAST SURGICAL HISTORY: ?Past Surgical History:  ?Procedure Laterality Date  ? ABDOMINAL HYSTERECTOMY    ? BREAST BIOPSY    ? BREAST LUMPECTOMY Left   ? 2016  ? BREAST SURGERY    ? left breast biopsy x 2  ? CYSTOSCOPY N/A 05/24/2013  ? Procedure: CYSTOSCOPY;  Surgeon: Carolyn Harraway-Smith, MD;  Location: [facility];  Service: Gynecology;  Laterality: N/A;  ? IRRIGATION AND DEBRIDEMENT ABSCESS Left 12/31/2018  ? Procedure: INCISION AND DRAINAGE BREAST;  Surgeon: Thimmappa, Brinda, MD;  Location: [facility];  Service: Plastics;  Laterality: Left;  ? LAPAROSCOPIC SALPINGO OOPHERECTOMY Bilateral 12/15/2015  ? Procedure: LAPAROSCOPIC  BILATERAL  OOPHORECTOMY;  Surgeon: Carolyn Harraway-Smith, MD;  Location: [facility];  Service: Gynecology;  Laterality: Bilateral;  ? MASTOPEXY Bilateral 11/27/2018  ? Procedure: BILATERAL BREAST MASTOPEXY;  Surgeon: Thimmappa, Brinda, MD;  Location: [facility];  Service: Plastics;  Laterality: Bilateral;  ? polyp removal     ? PORTACATH PLACEMENT Right 01/02/2014  ? Procedure: INSERTION PORT-A-CATH;  Surgeon: Matthew Wakefield, MD;  Location: [facility];  Service: General;  Laterality: Right;  ? portacath removal    ? RADIOACTIVE SEED GUIDED PARTIAL MASTECTOMY WITH AXILLARY SENTINEL LYMPH NODE BIOPSY Left 05/29/2014  ? Procedure: RADIOACTIVE SEED GUIDED LEFT PARTIAL MASTECTOMY WITH AXILLARY SENTINEL LYMPH NODE BIOPSY;  Surgeon: Matthew Wakefield, MD;  Location:  SURGERY CENTER;  Service: General;  Laterality: Left;  ? REDUCTION MAMMAPLASTY Bilateral 11/2018  ? Per patient path from reduction showed ALH  ? ROBOTIC ASSISTED TOTAL HYSTERECTOMY WITH BILATERAL SALPINGO OOPHERECTOMY Bilateral 05/24/2013  ? Procedure: ROBOTIC ASSISTED TOTAL HYSTERECTOMY WITH BILATERAL SALPINGECTOMY;  Surgeon: Carolyn Harraway-Smith, MD;  Location: [facility];  Service: Gynecology;  Laterality: Bilateral;  ? TONSILLECTOMY    ? WISDOM TOOTH EXTRACTION    ? ? ?FAMILY HISTORY ?Family History  ?Problem Relation Age of Onset  ? Heart disease Mother   ? Hypercholesterolemia Mother   ? Heart disease Father   ? COPD Father   ? Hypercholesterolemia Father   ?   Cancer Neg Hx   ?[name] parents are still living, her father is 73 years old and her mother 72 years old as of August 2015. The patient had no brothers, one sister. There is no history of breast or ovarian cancer in the family. ? ?GYNECOLOGIC HISTORY:  ?Patient's last menstrual period was 03/23/2013. ?Menarche age 31. She is GX P0. She took oral contraceptives for many years as well as Depo Provera shots. She is status post hysterectomy with bilateral salpingectomy. Ovaries are still in place  ? 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 She scans at work, and she also does all the shelving and stocking. After 11 years livingwith her significant other [name] Day they got married in 2019. He is disabled secondary to multiple orthop

## 2021-09-15 ENCOUNTER — Ambulatory Visit (HOSPITAL_COMMUNITY)
Admission: RE | Admit: 2021-09-15 | Discharge: 2021-09-15 | Disposition: A | Discharge status: Home / Self Care | Payer: Medicaid Other | Source: Ambulatory Visit | Attending: Adult Health | Admitting: Adult Health

## 2021-09-15 DIAGNOSIS — Z87891 Personal history of nicotine dependence: Secondary | ICD-10-CM | POA: Diagnosis present

## 2021-09-15 DIAGNOSIS — C50412 Malignant neoplasm of upper-outer quadrant of left female breast: Secondary | ICD-10-CM | POA: Insufficient documentation

## 2021-09-15 DIAGNOSIS — R911 Solitary pulmonary nodule: Secondary | ICD-10-CM | POA: Diagnosis not present

## 2021-09-15 DIAGNOSIS — Z170 Estrogen receptor positive status [ER+]: Secondary | ICD-10-CM | POA: Insufficient documentation

## 2021-09-15 DIAGNOSIS — Z122 Encounter for screening for malignant neoplasm of respiratory organs: Secondary | ICD-10-CM | POA: Diagnosis present

## 2021-09-15 MED ORDER — GADOBUTROL 1 MMOL/ML IV SOLN
7.0000 mL | Freq: Once | INTRAVENOUS | Status: AC | PRN
  Administered 2021-09-15: 7 mL via INTRAVENOUS

## 2021-09-16 ENCOUNTER — Telehealth: Payer: Self-pay

## 2021-09-16 NOTE — Telephone Encounter (Signed)
Called and spoke with pt, informed her of negative MRI results.  Pt  verbalized understanding and thanks

## 2021-09-16 NOTE — Telephone Encounter (Signed)
-----   Message from Gardenia Phlegm, NP sent at 09/16/2021 10:01 AM EDT ----- MRI is negative please notify patient ----- Message ----- From: Interface, Rad Results In Sent: 09/15/2021   4:18 PM EDT To: Gardenia Phlegm, NP

## 2022-07-22 ENCOUNTER — Other Ambulatory Visit: Payer: Self-pay | Admitting: Adult Health

## 2022-07-22 DIAGNOSIS — Z1231 Encounter for screening mammogram for malignant neoplasm of breast: Secondary | ICD-10-CM

## 2022-07-26 ENCOUNTER — Other Ambulatory Visit: Payer: Self-pay | Admitting: *Deleted

## 2022-07-26 ENCOUNTER — Other Ambulatory Visit: Payer: Self-pay | Admitting: Adult Health

## 2022-07-26 DIAGNOSIS — Z170 Estrogen receptor positive status [ER+]: Secondary | ICD-10-CM

## 2022-07-26 NOTE — Progress Notes (Signed)
Received call from pt stating she is experiencing new left breast tenderness and a small nodule.  Pt states she is unsure if nodule is related to hx of scar tissue or something new.  Pt scheduled for screening mammogram that needs to be changed to diagnostic for further testing.  Orders placed.

## 2022-08-08 ENCOUNTER — Other Ambulatory Visit: Payer: 59

## 2022-08-29 ENCOUNTER — Inpatient Hospital Stay: Payer: 59 | Attending: Adult Health | Admitting: Adult Health
